# Patient Record
Sex: Male | Born: 1935
Health system: Southern US, Community
[De-identification: ages and names within clinical notes are randomized; demographics above are authoritative.]

## PROBLEM LIST (undated history)

## (undated) DIAGNOSIS — Z8719 Personal history of other diseases of the digestive system: Secondary | ICD-10-CM

## (undated) DIAGNOSIS — R7302 Impaired glucose tolerance (oral): Secondary | ICD-10-CM

## (undated) DIAGNOSIS — M545 Low back pain, unspecified: Secondary | ICD-10-CM

## (undated) DIAGNOSIS — T7840XA Allergy, unspecified, initial encounter: Secondary | ICD-10-CM

## (undated) DIAGNOSIS — N400 Enlarged prostate without lower urinary tract symptoms: Secondary | ICD-10-CM

## (undated) DIAGNOSIS — M1990 Unspecified osteoarthritis, unspecified site: Secondary | ICD-10-CM

## (undated) DIAGNOSIS — D126 Benign neoplasm of colon, unspecified: Secondary | ICD-10-CM

## (undated) DIAGNOSIS — I6529 Occlusion and stenosis of unspecified carotid artery: Secondary | ICD-10-CM

## (undated) DIAGNOSIS — H9190 Unspecified hearing loss, unspecified ear: Secondary | ICD-10-CM

## (undated) DIAGNOSIS — K219 Gastro-esophageal reflux disease without esophagitis: Secondary | ICD-10-CM

## (undated) DIAGNOSIS — H269 Unspecified cataract: Secondary | ICD-10-CM

## (undated) DIAGNOSIS — K5730 Diverticulosis of large intestine without perforation or abscess without bleeding: Secondary | ICD-10-CM

## (undated) DIAGNOSIS — I10 Essential (primary) hypertension: Secondary | ICD-10-CM

## (undated) DIAGNOSIS — Z972 Presence of dental prosthetic device (complete) (partial): Secondary | ICD-10-CM

## (undated) DIAGNOSIS — I739 Peripheral vascular disease, unspecified: Secondary | ICD-10-CM

## (undated) DIAGNOSIS — Z8673 Personal history of transient ischemic attack (TIA), and cerebral infarction without residual deficits: Secondary | ICD-10-CM

## (undated) DIAGNOSIS — E785 Hyperlipidemia, unspecified: Secondary | ICD-10-CM

## (undated) DIAGNOSIS — N529 Male erectile dysfunction, unspecified: Secondary | ICD-10-CM

## (undated) DIAGNOSIS — B009 Herpesviral infection, unspecified: Secondary | ICD-10-CM

## (undated) DIAGNOSIS — E119 Type 2 diabetes mellitus without complications: Secondary | ICD-10-CM

## (undated) DIAGNOSIS — C801 Malignant (primary) neoplasm, unspecified: Secondary | ICD-10-CM

## (undated) HISTORY — DX: Male erectile dysfunction, unspecified: N52.9

## (undated) HISTORY — DX: Malignant (primary) neoplasm, unspecified: C80.1

## (undated) HISTORY — DX: Unspecified cataract: H26.9

## (undated) HISTORY — DX: Peripheral vascular disease, unspecified: I73.9

## (undated) HISTORY — DX: Gastro-esophageal reflux disease without esophagitis: K21.9

## (undated) HISTORY — DX: Hyperlipidemia, unspecified: E78.5

## (undated) HISTORY — PX: ESOPHAGOGASTRODUODENOSCOPY: SHX1529

## (undated) HISTORY — DX: Benign prostatic hyperplasia without lower urinary tract symptoms: N40.0

## (undated) HISTORY — DX: Essential (primary) hypertension: I10

## (undated) HISTORY — DX: Low back pain, unspecified: M54.50

## (undated) HISTORY — DX: Personal history of other diseases of the digestive system: Z87.19

## (undated) HISTORY — PX: LUMBAR SPINE SURGERY: SHX701

## (undated) HISTORY — DX: Benign neoplasm of colon, unspecified: D12.6

## (undated) HISTORY — PX: COLONOSCOPY: SHX5424

## (undated) HISTORY — DX: Diverticulosis of large intestine without perforation or abscess without bleeding: K57.30

## (undated) HISTORY — DX: Low back pain: M54.5

## (undated) HISTORY — PX: BLEPHAROPLASTY: SUR158

## (undated) HISTORY — DX: Occlusion and stenosis of unspecified carotid artery: I65.29

## (undated) HISTORY — PX: APPENDECTOMY: SHX54

## (undated) HISTORY — PX: COSMETIC SURGERY: SHX468

## (undated) HISTORY — PX: SPINE SURGERY: SHX786

## (undated) HISTORY — PX: EYE SURGERY: SHX253

## (undated) HISTORY — DX: Allergy, unspecified, initial encounter: T78.40XA

## (undated) HISTORY — DX: Impaired glucose tolerance (oral): R73.02

## (undated) HISTORY — PX: CHOLECYSTECTOMY: SHX55

## (undated) HISTORY — PX: BASAL CELL CARCINOMA EXCISION: SHX1214

## (undated) HISTORY — DX: Unspecified osteoarthritis, unspecified site: M19.90

## (undated) HISTORY — DX: Personal history of transient ischemic attack (TIA), and cerebral infarction without residual deficits: Z86.73

## (undated) HISTORY — DX: Type 2 diabetes mellitus without complications: E11.9

---

## 1991-08-08 DIAGNOSIS — D126 Benign neoplasm of colon, unspecified: Secondary | ICD-10-CM

## 1991-08-08 HISTORY — DX: Benign neoplasm of colon, unspecified: D12.6

## 1998-08-09 ENCOUNTER — Ambulatory Visit (HOSPITAL_COMMUNITY): Admission: RE | Admit: 1998-08-09 | Discharge: 1998-08-09 | Payer: Self-pay | Admitting: Gastroenterology

## 1998-11-12 ENCOUNTER — Ambulatory Visit (HOSPITAL_COMMUNITY): Admission: RE | Admit: 1998-11-12 | Discharge: 1998-11-12 | Payer: Self-pay | Admitting: Internal Medicine

## 1998-11-12 ENCOUNTER — Encounter: Payer: Self-pay | Admitting: Internal Medicine

## 1999-02-14 ENCOUNTER — Encounter: Payer: Self-pay | Admitting: Internal Medicine

## 1999-02-14 ENCOUNTER — Ambulatory Visit (HOSPITAL_COMMUNITY): Admission: RE | Admit: 1999-02-14 | Discharge: 1999-02-14 | Payer: Self-pay | Admitting: Internal Medicine

## 2000-07-11 ENCOUNTER — Ambulatory Visit (HOSPITAL_COMMUNITY): Admission: RE | Admit: 2000-07-11 | Discharge: 2000-07-11 | Payer: Self-pay | Admitting: Internal Medicine

## 2000-07-11 ENCOUNTER — Encounter: Payer: Self-pay | Admitting: Internal Medicine

## 2004-07-24 ENCOUNTER — Ambulatory Visit: Payer: Self-pay | Admitting: Internal Medicine

## 2004-07-24 ENCOUNTER — Ambulatory Visit (HOSPITAL_COMMUNITY): Admission: RE | Admit: 2004-07-24 | Discharge: 2004-07-24 | Payer: Self-pay | Admitting: Internal Medicine

## 2004-08-09 ENCOUNTER — Ambulatory Visit: Payer: Self-pay | Admitting: Internal Medicine

## 2004-09-09 ENCOUNTER — Ambulatory Visit: Payer: Self-pay | Admitting: Internal Medicine

## 2004-09-17 ENCOUNTER — Ambulatory Visit: Payer: Self-pay | Admitting: Internal Medicine

## 2004-12-11 ENCOUNTER — Ambulatory Visit: Payer: Self-pay | Admitting: Internal Medicine

## 2004-12-26 ENCOUNTER — Ambulatory Visit: Payer: Self-pay

## 2005-02-19 ENCOUNTER — Ambulatory Visit: Payer: Self-pay | Admitting: Internal Medicine

## 2005-06-12 ENCOUNTER — Ambulatory Visit: Payer: Self-pay | Admitting: Internal Medicine

## 2005-06-26 ENCOUNTER — Ambulatory Visit: Payer: Self-pay

## 2005-08-06 ENCOUNTER — Ambulatory Visit: Payer: Self-pay | Admitting: Internal Medicine

## 2005-08-07 ENCOUNTER — Ambulatory Visit: Payer: Self-pay | Admitting: Cardiology

## 2005-08-26 ENCOUNTER — Ambulatory Visit: Payer: Self-pay | Admitting: Internal Medicine

## 2005-12-04 ENCOUNTER — Ambulatory Visit: Payer: Self-pay | Admitting: Internal Medicine

## 2006-01-20 ENCOUNTER — Ambulatory Visit: Payer: Self-pay | Admitting: Internal Medicine

## 2006-01-28 ENCOUNTER — Ambulatory Visit: Payer: Self-pay | Admitting: Internal Medicine

## 2006-07-02 ENCOUNTER — Ambulatory Visit: Payer: Self-pay

## 2006-07-03 ENCOUNTER — Ambulatory Visit: Payer: Self-pay | Admitting: Internal Medicine

## 2006-10-06 ENCOUNTER — Ambulatory Visit: Payer: Self-pay | Admitting: Internal Medicine

## 2006-10-14 ENCOUNTER — Ambulatory Visit: Payer: Self-pay | Admitting: Internal Medicine

## 2006-12-02 ENCOUNTER — Ambulatory Visit: Payer: Self-pay | Admitting: Internal Medicine

## 2006-12-02 LAB — CONVERTED CEMR LAB
ALT: 31 units/L (ref 0–53)
Alkaline Phosphatase: 60 units/L (ref 39–117)
BUN: 16 mg/dL (ref 6–23)
Chloride: 104 meq/L (ref 96–112)
Eosinophils Absolute: 0.3 10*3/uL (ref 0.0–0.6)
GFR calc Af Amer: 84 mL/min
Glucose, Bld: 91 mg/dL (ref 70–99)
Ketones, ur: NEGATIVE mg/dL
Leukocytes, UA: NEGATIVE
MCHC: 34.2 g/dL (ref 30.0–36.0)
MCV: 87.2 fL (ref 78.0–100.0)
Monocytes Relative: 15.7 % — ABNORMAL HIGH (ref 3.0–11.0)
Neutro Abs: 3.8 10*3/uL (ref 1.4–7.7)
Platelets: 207 10*3/uL (ref 150–400)
RDW: 12.5 % (ref 11.5–14.6)
Sodium: 143 meq/L (ref 135–145)
Total Bilirubin: 1.3 mg/dL — ABNORMAL HIGH (ref 0.3–1.2)
Total Protein, Urine: NEGATIVE mg/dL
Total Protein: 7.1 g/dL (ref 6.0–8.3)
Urobilinogen, UA: 0.2 (ref 0.0–1.0)
WBC: 7.2 10*3/uL (ref 4.5–10.5)

## 2006-12-18 ENCOUNTER — Ambulatory Visit: Payer: Self-pay | Admitting: Internal Medicine

## 2006-12-21 ENCOUNTER — Ambulatory Visit: Payer: Self-pay | Admitting: Cardiovascular Disease

## 2006-12-21 ENCOUNTER — Ambulatory Visit: Payer: Self-pay

## 2006-12-28 ENCOUNTER — Encounter: Payer: Self-pay | Admitting: Internal Medicine

## 2006-12-28 DIAGNOSIS — I719 Aortic aneurysm of unspecified site, without rupture: Secondary | ICD-10-CM | POA: Insufficient documentation

## 2006-12-28 DIAGNOSIS — Z860100 Personal history of colon polyps, unspecified: Secondary | ICD-10-CM | POA: Insufficient documentation

## 2006-12-28 DIAGNOSIS — I739 Peripheral vascular disease, unspecified: Secondary | ICD-10-CM | POA: Insufficient documentation

## 2006-12-28 DIAGNOSIS — K5730 Diverticulosis of large intestine without perforation or abscess without bleeding: Secondary | ICD-10-CM | POA: Insufficient documentation

## 2006-12-28 DIAGNOSIS — L570 Actinic keratosis: Secondary | ICD-10-CM | POA: Insufficient documentation

## 2006-12-28 DIAGNOSIS — G459 Transient cerebral ischemic attack, unspecified: Secondary | ICD-10-CM | POA: Insufficient documentation

## 2006-12-28 DIAGNOSIS — I10 Essential (primary) hypertension: Secondary | ICD-10-CM | POA: Insufficient documentation

## 2006-12-31 ENCOUNTER — Ambulatory Visit: Payer: Self-pay

## 2007-01-04 ENCOUNTER — Ambulatory Visit: Payer: Self-pay | Admitting: Internal Medicine

## 2007-02-10 ENCOUNTER — Ambulatory Visit: Payer: Self-pay | Admitting: Internal Medicine

## 2007-05-13 ENCOUNTER — Ambulatory Visit: Payer: Self-pay | Admitting: Internal Medicine

## 2007-09-10 ENCOUNTER — Ambulatory Visit: Payer: Self-pay | Admitting: Internal Medicine

## 2007-09-12 LAB — CONVERTED CEMR LAB
AST: 20 units/L (ref 0–37)
Alkaline Phosphatase: 51 units/L (ref 39–117)
BUN: 14 mg/dL (ref 6–23)
CO2: 28 meq/L (ref 19–32)
Calcium: 9.4 mg/dL (ref 8.4–10.5)
Chloride: 108 meq/L (ref 96–112)
Potassium: 4.7 meq/L (ref 3.5–5.1)
Sodium: 140 meq/L (ref 135–145)
Total Bilirubin: 1.5 mg/dL — ABNORMAL HIGH (ref 0.3–1.2)

## 2007-09-14 ENCOUNTER — Ambulatory Visit: Payer: Self-pay | Admitting: Internal Medicine

## 2007-09-14 DIAGNOSIS — R7309 Other abnormal glucose: Secondary | ICD-10-CM | POA: Insufficient documentation

## 2007-12-13 ENCOUNTER — Ambulatory Visit: Payer: Self-pay | Admitting: Internal Medicine

## 2007-12-13 LAB — CONVERTED CEMR LAB
AST: 24 units/L (ref 0–37)
BUN: 13 mg/dL (ref 6–23)
Basophils Absolute: 0 10*3/uL (ref 0.0–0.1)
Bilirubin, Direct: 0.1 mg/dL (ref 0.0–0.3)
CO2: 28 meq/L (ref 19–32)
Calcium: 9.5 mg/dL (ref 8.4–10.5)
Chloride: 107 meq/L (ref 96–112)
Eosinophils Relative: 4.5 % (ref 0.0–5.0)
GFR calc non Af Amer: 77 mL/min
Glucose, Bld: 121 mg/dL — ABNORMAL HIGH (ref 70–99)
HCT: 41.4 % (ref 39.0–52.0)
Hgb A1c MFr Bld: 6.2 % — ABNORMAL HIGH (ref 4.6–6.0)
LDL Cholesterol: 107 mg/dL — ABNORMAL HIGH (ref 0–99)
MCHC: 35 g/dL (ref 30.0–36.0)
Neutrophils Relative %: 52.9 % (ref 43.0–77.0)
PSA: 0.21 ng/mL (ref 0.10–4.00)
Platelets: 177 10*3/uL (ref 150–400)
Total Bilirubin: 1.3 mg/dL — ABNORMAL HIGH (ref 0.3–1.2)
Total CHOL/HDL Ratio: 4.8

## 2007-12-16 ENCOUNTER — Ambulatory Visit: Payer: Self-pay | Admitting: Internal Medicine

## 2008-02-23 ENCOUNTER — Ambulatory Visit: Payer: Self-pay | Admitting: Internal Medicine

## 2008-02-23 DIAGNOSIS — R0789 Other chest pain: Secondary | ICD-10-CM | POA: Insufficient documentation

## 2008-02-24 DIAGNOSIS — N401 Enlarged prostate with lower urinary tract symptoms: Secondary | ICD-10-CM | POA: Insufficient documentation

## 2008-02-24 DIAGNOSIS — K219 Gastro-esophageal reflux disease without esophagitis: Secondary | ICD-10-CM | POA: Insufficient documentation

## 2008-02-24 DIAGNOSIS — E785 Hyperlipidemia, unspecified: Secondary | ICD-10-CM | POA: Insufficient documentation

## 2008-02-24 DIAGNOSIS — R351 Nocturia: Secondary | ICD-10-CM

## 2008-02-24 DIAGNOSIS — J309 Allergic rhinitis, unspecified: Secondary | ICD-10-CM | POA: Insufficient documentation

## 2008-02-25 ENCOUNTER — Telehealth (INDEPENDENT_AMBULATORY_CARE_PROVIDER_SITE_OTHER): Payer: Self-pay | Admitting: *Deleted

## 2008-04-13 ENCOUNTER — Ambulatory Visit: Payer: Self-pay | Admitting: Internal Medicine

## 2008-04-13 LAB — CONVERTED CEMR LAB
CO2: 27 meq/L (ref 19–32)
Potassium: 4.5 meq/L (ref 3.5–5.1)

## 2008-04-20 ENCOUNTER — Ambulatory Visit: Payer: Self-pay | Admitting: Gastroenterology

## 2008-04-20 ENCOUNTER — Ambulatory Visit: Payer: Self-pay | Admitting: Internal Medicine

## 2008-05-19 ENCOUNTER — Ambulatory Visit: Payer: Self-pay | Admitting: Gastroenterology

## 2008-05-19 ENCOUNTER — Encounter: Payer: Self-pay | Admitting: Gastroenterology

## 2008-05-22 ENCOUNTER — Encounter: Payer: Self-pay | Admitting: Gastroenterology

## 2008-06-01 ENCOUNTER — Ambulatory Visit: Payer: Self-pay | Admitting: Internal Medicine

## 2008-06-01 DIAGNOSIS — R109 Unspecified abdominal pain: Secondary | ICD-10-CM | POA: Insufficient documentation

## 2008-06-01 DIAGNOSIS — R110 Nausea: Secondary | ICD-10-CM | POA: Insufficient documentation

## 2008-06-04 LAB — CONVERTED CEMR LAB
AST: 29 units/L (ref 0–37)
Albumin: 4.3 g/dL (ref 3.5–5.2)
Amylase: 50 units/L (ref 27–131)
BUN: 15 mg/dL (ref 6–23)
Basophils Relative: 0.4 % (ref 0.0–3.0)
Calcium: 9.6 mg/dL (ref 8.4–10.5)
Chloride: 104 meq/L (ref 96–112)
Creatinine, Ser: 1 mg/dL (ref 0.4–1.5)
Eosinophils Absolute: 0.3 10*3/uL (ref 0.0–0.7)
Eosinophils Relative: 4.4 % (ref 0.0–5.0)
GFR calc non Af Amer: 77 mL/min
HCT: 41.2 % (ref 39.0–52.0)
Hemoglobin: 14.3 g/dL (ref 13.0–17.0)
Ketones, ur: NEGATIVE mg/dL
Lipase: 18 units/L (ref 11.0–59.0)
MCHC: 34.7 g/dL (ref 30.0–36.0)
MCV: 87 fL (ref 78.0–100.0)
Monocytes Absolute: 0.6 10*3/uL (ref 0.1–1.0)
Neutro Abs: 3.3 10*3/uL (ref 1.4–7.7)
Neutrophils Relative %: 55.4 % (ref 43.0–77.0)
RBC: 4.74 M/uL (ref 4.22–5.81)
Specific Gravity, Urine: 1.02 (ref 1.000–1.03)
Total CK: 400 units/L (ref 7–195)
Total Protein, Urine: NEGATIVE mg/dL
Urine Glucose: NEGATIVE mg/dL
WBC: 6 10*3/uL (ref 4.5–10.5)
pH: 6 (ref 5.0–8.0)

## 2008-06-05 ENCOUNTER — Telehealth: Payer: Self-pay | Admitting: Internal Medicine

## 2008-06-30 ENCOUNTER — Ambulatory Visit: Payer: Self-pay | Admitting: Internal Medicine

## 2008-06-30 LAB — CONVERTED CEMR LAB
AST: 19 units/L (ref 0–37)
HDL: 38.7 mg/dL — ABNORMAL LOW (ref >39.0)
Hgb A1c MFr Bld: 6.4 % — ABNORMAL HIGH (ref 4.6–6.0)
Total Bilirubin: 1.2 mg/dL (ref 0.3–1.2)
Total CHOL/HDL Ratio: 3.7
VLDL: 14 mg/dL (ref 0–40)

## 2008-08-17 ENCOUNTER — Ambulatory Visit: Payer: Self-pay | Admitting: Internal Medicine

## 2008-11-30 ENCOUNTER — Ambulatory Visit: Payer: Self-pay | Admitting: Internal Medicine

## 2008-11-30 LAB — CONVERTED CEMR LAB
Albumin: 4.3 g/dL (ref 3.5–5.2)
CO2: 29 meq/L (ref 19–32)
Chloride: 104 meq/L (ref 96–112)
HDL: 39.7 mg/dL (ref >39.00)
LDL Cholesterol: 89 mg/dL (ref 0–99)
Sodium: 140 meq/L (ref 135–145)
Total CHOL/HDL Ratio: 4
Triglycerides: 176 mg/dL — ABNORMAL HIGH (ref 0.0–149.0)
VLDL: 35.2 mg/dL (ref 0.0–40.0)

## 2008-12-04 ENCOUNTER — Ambulatory Visit: Payer: Self-pay | Admitting: Internal Medicine

## 2008-12-04 DIAGNOSIS — M545 Low back pain, unspecified: Secondary | ICD-10-CM | POA: Insufficient documentation

## 2009-03-29 ENCOUNTER — Ambulatory Visit: Payer: Self-pay | Admitting: Internal Medicine

## 2009-03-29 LAB — CONVERTED CEMR LAB
ALT: 26 units/L (ref 0–53)
Alkaline Phosphatase: 56 units/L (ref 39–117)
Bilirubin, Direct: 0.2 mg/dL (ref 0.0–0.3)
CO2: 26 meq/L (ref 19–32)
Calcium: 9.5 mg/dL (ref 8.4–10.5)
Chloride: 108 meq/L (ref 96–112)
HDL: 38.9 mg/dL — ABNORMAL LOW (ref >39.00)
Sodium: 141 meq/L (ref 135–145)
TSH: 1.34 microintl units/mL (ref 0.35–5.50)
Total CHOL/HDL Ratio: 4
Total Protein: 7.1 g/dL (ref 6.0–8.3)

## 2009-04-03 ENCOUNTER — Ambulatory Visit: Payer: Self-pay | Admitting: Internal Medicine

## 2009-04-03 DIAGNOSIS — Z87891 Personal history of nicotine dependence: Secondary | ICD-10-CM | POA: Insufficient documentation

## 2009-04-06 ENCOUNTER — Telehealth: Payer: Self-pay | Admitting: Internal Medicine

## 2009-05-24 ENCOUNTER — Telehealth: Payer: Self-pay | Admitting: Internal Medicine

## 2009-05-28 ENCOUNTER — Telehealth: Payer: Self-pay | Admitting: Internal Medicine

## 2009-05-28 ENCOUNTER — Ambulatory Visit: Payer: Self-pay | Admitting: Internal Medicine

## 2009-05-28 DIAGNOSIS — L304 Erythema intertrigo: Secondary | ICD-10-CM | POA: Insufficient documentation

## 2009-05-28 DIAGNOSIS — L538 Other specified erythematous conditions: Secondary | ICD-10-CM | POA: Insufficient documentation

## 2009-05-28 DIAGNOSIS — A059 Bacterial foodborne intoxication, unspecified: Secondary | ICD-10-CM | POA: Insufficient documentation

## 2009-07-05 ENCOUNTER — Ambulatory Visit: Payer: Self-pay | Admitting: Internal Medicine

## 2009-07-05 ENCOUNTER — Encounter (INDEPENDENT_AMBULATORY_CARE_PROVIDER_SITE_OTHER): Payer: Self-pay | Admitting: *Deleted

## 2009-07-05 DIAGNOSIS — R1310 Dysphagia, unspecified: Secondary | ICD-10-CM | POA: Insufficient documentation

## 2009-07-06 LAB — CONVERTED CEMR LAB
AST: 22 units/L (ref 0–37)
Alkaline Phosphatase: 55 units/L (ref 39–117)
Basophils Absolute: 0.1 10*3/uL (ref 0.0–0.1)
Bilirubin Urine: NEGATIVE
Bilirubin, Direct: 0.2 mg/dL (ref 0.0–0.3)
Calcium: 9.7 mg/dL (ref 8.4–10.5)
GFR calc non Af Amer: 68.71 mL/min (ref >60)
Glucose, Bld: 116 mg/dL — ABNORMAL HIGH (ref 70–99)
Hemoglobin: 14.4 g/dL (ref 13.0–17.0)
Ketones, ur: NEGATIVE mg/dL
LDL Cholesterol: 71 mg/dL (ref 0–99)
Leukocytes, UA: NEGATIVE
Lymphocytes Relative: 26.6 % (ref 12.0–46.0)
Monocytes Relative: 8.1 % (ref 3.0–12.0)
Neutro Abs: 4.1 10*3/uL (ref 1.4–7.7)
Neutrophils Relative %: 61.9 % (ref 43.0–77.0)
PSA: 0.26 ng/mL (ref 0.10–4.00)
Platelets: 163 10*3/uL (ref 150.0–400.0)
RDW: 12.7 % (ref 11.5–14.6)
Sodium: 142 meq/L (ref 135–145)
TSH: 2.02 microintl units/mL (ref 0.35–5.50)
Total Bilirubin: 1.4 mg/dL — ABNORMAL HIGH (ref 0.3–1.2)
Total CHOL/HDL Ratio: 3
Urine Glucose: NEGATIVE mg/dL
Urobilinogen, UA: 0.2 (ref 0.0–1.0)
VLDL: 30 mg/dL (ref 0.0–40.0)

## 2009-07-11 ENCOUNTER — Telehealth (INDEPENDENT_AMBULATORY_CARE_PROVIDER_SITE_OTHER): Payer: Self-pay | Admitting: *Deleted

## 2009-07-27 ENCOUNTER — Telehealth: Payer: Self-pay | Admitting: Gastroenterology

## 2009-07-30 ENCOUNTER — Encounter: Payer: Self-pay | Admitting: Gastroenterology

## 2009-07-30 ENCOUNTER — Ambulatory Visit: Payer: Self-pay | Admitting: Internal Medicine

## 2009-07-30 DIAGNOSIS — R1310 Dysphagia, unspecified: Secondary | ICD-10-CM | POA: Insufficient documentation

## 2009-08-14 ENCOUNTER — Ambulatory Visit: Payer: Self-pay | Admitting: Gastroenterology

## 2009-09-03 ENCOUNTER — Ambulatory Visit: Payer: Self-pay | Admitting: Internal Medicine

## 2009-11-16 ENCOUNTER — Ambulatory Visit: Payer: Self-pay | Admitting: Internal Medicine

## 2009-11-16 LAB — CONVERTED CEMR LAB
ALT: 22 units/L (ref 0–53)
Albumin: 4.3 g/dL (ref 3.5–5.2)
BUN: 19 mg/dL (ref 6–23)
Chloride: 107 meq/L (ref 96–112)
Cholesterol: 159 mg/dL (ref 0–200)
Creatinine, Ser: 1.1 mg/dL (ref 0.4–1.5)
GFR calc non Af Amer: 70.87 mL/min (ref >60)
Glucose, Bld: 121 mg/dL — ABNORMAL HIGH (ref 70–99)
LDL Cholesterol: 84 mg/dL (ref 0–99)
Total Bilirubin: 1.2 mg/dL (ref 0.3–1.2)
Triglycerides: 157 mg/dL — ABNORMAL HIGH (ref 0.0–149.0)

## 2009-11-26 ENCOUNTER — Ambulatory Visit: Payer: Self-pay | Admitting: Internal Medicine

## 2009-11-29 ENCOUNTER — Encounter: Payer: Self-pay | Admitting: Internal Medicine

## 2010-01-03 ENCOUNTER — Telehealth: Payer: Self-pay | Admitting: Gastroenterology

## 2010-01-03 ENCOUNTER — Telehealth: Payer: Self-pay | Admitting: Internal Medicine

## 2010-01-04 ENCOUNTER — Ambulatory Visit: Payer: Self-pay | Admitting: Internal Medicine

## 2010-01-04 DIAGNOSIS — R509 Fever, unspecified: Secondary | ICD-10-CM | POA: Insufficient documentation

## 2010-01-04 DIAGNOSIS — R1031 Right lower quadrant pain: Secondary | ICD-10-CM | POA: Insufficient documentation

## 2010-01-21 ENCOUNTER — Ambulatory Visit: Payer: Self-pay | Admitting: Internal Medicine

## 2010-01-21 DIAGNOSIS — N529 Male erectile dysfunction, unspecified: Secondary | ICD-10-CM | POA: Insufficient documentation

## 2010-02-26 ENCOUNTER — Telehealth: Payer: Self-pay | Admitting: Internal Medicine

## 2010-02-28 ENCOUNTER — Telehealth: Payer: Self-pay | Admitting: Internal Medicine

## 2010-04-22 ENCOUNTER — Ambulatory Visit: Payer: Self-pay | Admitting: Internal Medicine

## 2010-04-22 LAB — CONVERTED CEMR LAB
ALT: 18 units/L (ref 0–53)
AST: 19 units/L (ref 0–37)
Albumin: 4.3 g/dL (ref 3.5–5.2)
Alkaline Phosphatase: 54 units/L (ref 39–117)
BUN: 17 mg/dL (ref 6–23)
Basophils Relative: 0.3 % (ref 0.0–3.0)
CO2: 27 meq/L (ref 19–32)
Cholesterol: 156 mg/dL (ref 0–200)
Eosinophils Absolute: 0.2 10*3/uL (ref 0.0–0.7)
Eosinophils Relative: 2.7 % (ref 0.0–5.0)
GFR calc non Af Amer: 79.28 mL/min (ref >60)
Glucose, Bld: 129 mg/dL — ABNORMAL HIGH (ref 70–99)
HCT: 41.5 % (ref 39.0–52.0)
Hemoglobin: 14.4 g/dL (ref 13.0–17.0)
Lymphs Abs: 2 10*3/uL (ref 0.7–4.0)
MCHC: 34.8 g/dL (ref 30.0–36.0)
MCV: 89.2 fL (ref 78.0–100.0)
Monocytes Absolute: 0.5 10*3/uL (ref 0.1–1.0)
Neutro Abs: 4.4 10*3/uL (ref 1.4–7.7)
Neutrophils Relative %: 61.5 % (ref 43.0–77.0)
Potassium: 5.3 meq/L — ABNORMAL HIGH (ref 3.5–5.1)
RBC: 4.66 M/uL (ref 4.22–5.81)
Sodium: 138 meq/L (ref 135–145)
Total Protein: 6.8 g/dL (ref 6.0–8.3)
VLDL: 24.8 mg/dL (ref 0.0–40.0)
WBC: 7.1 10*3/uL (ref 4.5–10.5)

## 2010-04-24 ENCOUNTER — Ambulatory Visit: Payer: Self-pay | Admitting: Internal Medicine

## 2010-04-24 DIAGNOSIS — M1990 Unspecified osteoarthritis, unspecified site: Secondary | ICD-10-CM | POA: Insufficient documentation

## 2010-07-09 NOTE — Assessment & Plan Note (Signed)
Summary: dysphagia/sheri   History of Present Illness Visit Type: Initial Visit Primary GI MD: Elie Goody MD [facility] Primary Provider: Jacinta Shoe, MD Chief Complaint: Pt states 4 weeks ago he ate pork strips and afterwards he starting having chest pain and a sore throat. Pt states his throat stays sore and food feels like its getting stuck more frequently now.  History of Present Illness:   77 YO MALE KNOWN TO DR.STARK WITH HX OF ADENOMATOUS COLON POLYPS.HE LAST HAD COLONOSCOPY IN 12/09-2 ADENOMATOUS POLYPS REMOVED. HE COMES IN TODAY WITH C/O SOLID FOOD DYSPHAGIA WHICH HAS BEEN PRESENT OVER THE PAST 3-4 WEEKS. HE RELATES IT TO AN URI WHICH IS STILL LINGERING WITH DRY COUGH ETC. HE HAS NO C/O HEARTBURN,INDIGESTION,NO ABDOMINAL PAIN,APPETITE IS FINE. HE FEELS SOLID FOOD ESPECIALLY MEATS" HANG UP" THEN GETS DISCOMFORT IN HIS CHEST. NO REGURGITATION. NO WEIGHT LOSS.   GI Review of Systems    Reports abdominal pain, bloating, chest pain, and  dysphagia with solids.     Location of  Abdominal pain: epigastric area.    Denies acid reflux, belching, dysphagia with liquids, heartburn, loss of appetite, nausea, vomiting, vomiting blood, weight loss, and  weight gain.        Denies anal fissure, black tarry stools, change in bowel habit, constipation, diarrhea, diverticulosis, fecal incontinence, heme positive stool, hemorrhoids, irritable bowel syndrome, jaundice, light color stool, liver problems, rectal bleeding, and  rectal pain.    Current Medications (verified): 1)  Aspirin 81 Mg Tabs (Aspirin) 2)  Lisinopril 20 Mg Tabs (Lisinopril) .... Once Daily 3)  Viagra 100 Mg Tabs (Sildenafil Citrate) 4)  Vitamin D3 1000 Unit  Tabs (Cholecalciferol) .Marland Kitchen.. 1 By Mouth Daily 5)  Crestor 10 Mg Tabs (Rosuvastatin Calcium) .... One Tablet By Mouth Once Daily 6)  Nexium 40 Mg Cpdr (Esomeprazole Magnesium) .Marland Kitchen.. 1 By Mouth Qam ( Medically Necessary)  Allergies (verified): 1)  * Statins  Past  History:  Past Medical History: Colonic polyps, hx of- one with carcinoma IN SITU 1993,MULTIPLE ADENOMATOUS Diverticulosis, colon Hypertension Peripheral vascular disease - bilat carotid Transient ischemic attack, hx of ED glucose intolerance Hyperlipidemia GERD Allergic rhinitis Benign prostatic hypertrophy hx of pancreatitis Low back pain  Past Surgical History: Reviewed history from 02/23/2008 and no changes required. 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  The patient denies anemia, anxiety-new, arthritis/joint pain, blood in urine, breast changes/lumps, change in vision, confusion, coughing up blood, depression-new, fainting, fatigue, fever, headaches-new, heart murmur, heart rhythm changes, menstrual pain, night sweats, nosebleeds, pregnancy symptoms, shortness of breath, sleeping problems, swelling of feet/legs, swollen lymph glands, thirst - excessive , urination - excessive , urination changes/pain, urine leakage, vision changes, and voice change.         ROS OTHERWISE AS IN HPI  Vital Signs:  Patient profile:   77 year old male Height:      71 inches Weight:      215.25 pounds BMI:     30.13 Pulse rate:   70 / minute Pulse rhythm:   regular BP sitting:   146 / 70  (left arm) Cuff size:   large  Vitals Entered By: Christie Nottingham CMA Duncan Dull) (July 30, 2009 8:25 AM)  Physical Exam  General:  Well developed, well nourished, no acute distress. Head:  Normocephalic and atraumatic. Eyes:  PERRLA, no icterus. Lungs:  Clear throughout to auscultation. Heart:  Regular rate and rhythm; no murmurs, rubs,  or bruits. Abdomen:  SOFT, NONTENDER, NO MASS OR  HSM,BS+ Rectal:  NOT DONE Extremities:  No clubbing, cyanosis, edema or deformities noted. Neurologic:  Alert and  oriented x4;  grossly normal neurologically. Psych:  Alert and cooperative. Normal mood and affect.   Impression & Recommendations:  Problem # 1:  DYSPHAGIA (ICD-787.29) Assessment New 77 YO MALE WITH NEW ONSET SOLID FOOD DYSPHAGIA;R/O PEPTIC STICTURE,R/O MALIGNANCY.  START NEXIUM 40 MG DAILY (PT HAS NEXIUM ON HIS MED LIST BUT DOES NOT THINK HE HAS BEEN TAKING IT REGULARLY) IN AM . SCHEDULE PT FOR EGD WITH POSSIBLE SAVARY DILATION WITH DR. Russella Dar ,PROCEDURE DISCUSSED IN DETAIL WITH PT. ADVISED AVOIDANCE OF MEAT EXCEPT GROUND/FINELY CHOPPED UNTIL EGD.  Problem # 2:  FAMILY HX COLON CANCER (ICD-V16.0) Assessment: Comment Only LAST COLON 2009  Problem # 3:  DIVERTICULOSIS, COLON (ICD-562.10) Assessment: Comment Only  Problem # 4:  TRANSIENT ISCHEMIC ATTACK, HX OF (ICD-V12.50) Assessment: Comment Only  Problem # 5:  COLONIC POLYPS, HX OF (ICD-V12.72) Assessment: Comment Only ADENOMATOUS-DUE FOR FOLLOW UP  05/2011  Other Orders: EGD (EGD)  Patient Instructions: 1)  Endoscopy scheduled with Dr. Russella Dar on 08-14-09. 2)  Endoscopy brochure provided. 3)  [facility] Patient Information Guide given . 4)  Copy sent to : A. Plotnikov, MD Prescriptions: NEXIUM 40 MG CPDR (ESOMEPRAZOLE MAGNESIUM) 1 by mouth qam ( medically necessary)  #30 x 12   Entered by:   Lowry Ram NCMA   Authorized by:   Sammuel Cooper PA-c   Signed by:   Lowry Ram NCMA on 07/30/2009   Method used:   [facility] to        [facility]  [address] [id number]       [address]       [place], [place]  [address]       Ph: [phone] or [phone]       Fax: [phone]   RxID:   [id number]

## 2010-07-09 NOTE — Progress Notes (Signed)
Summary: rx request  Phone Note Call from Patient Call back at Home Phone [phone]   Caller: Spouse Summary of Call: Patient spouse left message on triage that the patient has had a cold/sore throat x1 week. OTC meds have not helped much, and they would like to know if ABS could be called in. Please advise. Initial call taken by: Lucious Groves,  July 11, 2009 11:25 AM  Follow-up for Phone Call        ok [facility]pac Follow-up by: Tresa Garter MD,  July 11, 2009 12:34 PM  Additional Follow-up for Phone Call Additional follow up Details #1::        pt informed Additional Follow-up by: Ami Bullins CMA,  July 11, 2009 1:28 PM    New/Updated Medications: ZITHROMAX Z-PAK 250 MG TABS (AZITHROMYCIN) as dirrected Prescriptions: ZITHROMAX Z-PAK 250 MG TABS (AZITHROMYCIN) as dirrected  #1 x 0   Entered and Authorized by:   Tresa Garter MD   Signed by:   Bill Salinas CMA on 07/11/2009   Method used:   [facility]  [address] [id number]       [address]       [place], [place]  [address]       Ph: [phone] or [phone]       Fax: [phone]   RxID:   [id number]

## 2010-07-09 NOTE — Assessment & Plan Note (Signed)
Summary: STOMACH FEEL JAMMED AND FOOD WON'T COME OUT  STC   Vital Signs:  Patient profile:   77 year old male Height:      71 inches Weight:      203 pounds BMI:     28.42 O2 Sat:      96 % on Room air Temp:     99.0 degrees F oral Pulse rate:   80 / minute Pulse rhythm:   regular Resp:     16 per minute BP sitting:   110 / 72  (left arm) Cuff size:   regular  Vitals Entered By: Lanier Prude, CMA(AAMA) (January 04, 2010 4:36 PM)  O2 Flow:  Room air CC: abd pain X 1 week Is Patient Diabetic? No   Primary Care Provider:  Jacinta Shoe, MD  CC:  abd pain X 1 week.  History of Present Illness: Pt c/o "stomach" toubles, also thinks that after he ate "a lot" of corn last week it is stuck somewhere in intestines. Also has had the "blind staggers" when  he  got overheated x 1 hr.  Took a laxative.The pain was in LLQ and  in RLQ. No n/v. Constipated...  Current Medications (verified): 1)  Aspirin 81 Mg Tabs (Aspirin) 2)  Lisinopril 20 Mg Tabs (Lisinopril) .... Once Daily 3)  Viagra 100 Mg Tabs (Sildenafil Citrate) 4)  Vitamin D3 1000 Unit  Tabs (Cholecalciferol) .Marland Kitchen.. 1 By Mouth Daily 5)  Crestor 10 Mg Tabs (Rosuvastatin Calcium) .... One Tablet By Mouth Once Daily 6)  Omeprazole 40 Mg Cpdr (Omeprazole) .Marland Kitchen.. 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 basal cell s/p LS spine  surgury Cholecystectomy Appendectomy  Review of Systems       The patient complains of anorexia and abdominal pain.  The patient denies fever, weight loss, chest pain, dyspnea on exertion, melena, hematochezia, and severe indigestion/heartburn.    Physical Exam  General:  NAD overweight-appearing.   Eyes:  No corneal or conjunctival inflammation noted. EOMI. Perrla.  Ears:  External ear exam shows no significant lesions or deformities.  Otoscopic examination reveals clear canals, tympanic membranes are intact bilaterally without bulging, retraction, inflammation or discharge. Hearing is grossly normal bilaterally. Nose:  swollen nasal mucosa Mouth:  WNL Neck:  No mass or bruit Lungs:  CTA Heart:  RRR Abdomen:  S/NT no masses, no rigidity, no hepatomegaly, and no splenomegaly.   Msk:  No deformity or scoliosis noted of thoracic or lumbar spine.   Extremities:  No clubbing, cyanosis, edema, or deformity noted with normal full range of motion of all joints.   Neurologic:  No cranial nerve deficits noted. Station and gait are normal. Plantar reflexes are down-going bilaterally. DTRs are symmetrical throughout. Sensory, motor and coordinative functions appear intact. Skin:  No jaundice. AKs on face and UEs Inguinal Nodes:  No significant adenopathy Psych:  Cognition and judgment appear intact. Alert and cooperative with normal attention span and concentration. No apparent delusions, illusions, hallucinations   Impression &  Recommendations:  Problem # 1:  RLQ PAIN (ICD-789.03) possibly due to diverticulitis Assessment New Antibiotics given See "Patient Instructions".  Orders: TLB-BMP (Basic Metabolic Panel-BMET) (80048-METABOL) TLB-CBC Platelet - w/Differential (85025-CBCD) TLB-Sedimentation Rate (ESR) (85652-ESR) T-Abdomen 2-view (74020TC)  Problem # 2:  FEVER UNSPECIFIED (ICD-780.60) Assessment: New  Orders: T-Abdomen 2-view (74020TC)  Problem # 3:  DYSPHAGIA  (WJX-914.78) Assessment: Deteriorated GI consult is pending   Problem # 4:  COLONIC POLYPS, HX OF (ICD-V12.72) Assessment: Comment Only  Complete Medication List: 1)  Aspirin 81 Mg Tabs (Aspirin) 2)  Lisinopril 20 Mg Tabs (Lisinopril) .... Once daily 3)  Viagra 100 Mg Tabs (Sildenafil citrate) 4)  Vitamin D3 1000 Unit Tabs (Cholecalciferol) .Marland Kitchen.. 1 by mouth daily 5)  Crestor 10 Mg Tabs (Rosuvastatin calcium) .... One tablet by mouth once daily 6)  Omeprazole 40 Mg Cpdr (Omeprazole) .Marland Kitchen.. 1 by mouth qam for indigestion 7)  Ciprofloxacin Hcl 500 Mg Tabs (Ciprofloxacin hcl) .Marland Kitchen.. 1 by mouth bid 8)  Metronidazole 250 Mg Tabs (Metronidazole) .Marland Kitchen.. 1 by mouth qid  Patient Instructions: 1)  Low residue diet 2)  Please schedule a follow-up appointment in 2 weeks. 3)  Amitiza 1 a day for constipation 4)  Call if you are not better in a reasonable amount of time or if worse. Go to ER if feeling really bad!  Prescriptions: METRONIDAZOLE 250 MG TABS (METRONIDAZOLE) 1 by mouth qid  #40 x 1   Entered and Authorized by:   Tresa Garter MD   Signed by:   Tresa Garter MD on 01/04/2010   Method used:   Print then Give to Patient   RxID:   [id number] CIPROFLOXACIN HCL 500 MG TABS (CIPROFLOXACIN HCL) 1 by mouth bid  #20 x 0   Entered and Authorized by:   Tresa Garter MD   Signed by:   Tresa Garter MD on 01/04/2010   Method used:   Print then Give to Patient   RxID:   [id number] METRONIDAZOLE 250 MG TABS (METRONIDAZOLE) 1 by mouth qid  #40 x 1   Entered and Authorized by:   Tresa Garter MD   Signed by:   Tresa Garter MD on 01/04/2010   Method used:   [facility]        [facility]  [address] [id number]       [address]       [place], [place]  [address]       Ph: [phone] or [phone]       Fax: [phone]   RxID:   [id number] CIPROFLOXACIN HCL 500 MG TABS (CIPROFLOXACIN HCL) 1 by mouth bid  #20 x 0    Entered and Authorized by:   Tresa Garter MD   Signed by:   Tresa Garter MD on 01/04/2010   Method used:   [facility]  [address] [id number]       [address]       [place], [place]  [address]       Ph: [phone] or [phone]       Fax: [phone]   RxID:   [id number]

## 2010-07-09 NOTE — Assessment & Plan Note (Signed)
Summary: YEARLY FU/ MEDICARE /NWS #  // changed to ROB (cpx) per wife/cd   Vital Signs:  Patient profile:   77 year old male Weight:      217 pounds Temp:     97.6 degrees F oral Pulse rate:   69 / minute BP sitting:   162 / 64  (left arm)  Vitals Entered By: Tora Perches (July 05, 2009 8:29 AM) CC: cpx Is Patient Diabetic? No   CC:  cpx.  History of Present Illness: The patient presents for a wellness examination C/o food would stop in the throat x 2 wks that started after he ate popcorn  Preventive Screening-Counseling & Management  Alcohol-Tobacco     Smoking Status: quit  Current Medications (verified): 1)  Aspirin 81 Mg Tabs (Aspirin) 2)  Lisinopril 20 Mg Tabs (Lisinopril) .... Once Daily 3)  Viagra 100 Mg Tabs (Sildenafil Citrate) 4)  Vitamin D3 1000 Unit  Tabs (Cholecalciferol) .Marland Kitchen.. 1 By Mouth Daily 5)  Naprosyn 500 Mg Tabs (Naproxen) .Marland Kitchen.. 1 Two Times A Day Pc Prn 6)  Crestor 40 Mg Tabs (Rosuvastatin Calcium) .Marland Kitchen.. 1 Tablet By Mouth Daily 7)  Hydrocodone-Acetaminophen 5-325 Mg Tabs (Hydrocodone-Acetaminophen) .Marland Kitchen.. 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  The patient denies anorexia, fever, weight loss, weight gain, vision loss, decreased hearing, hoarseness, chest pain, syncope, dyspnea on exertion, peripheral edema, prolonged cough, headaches, hemoptysis, abdominal pain, melena, hematochezia, hematuria, incontinence, genital sores, muscle weakness, suspicious skin lesions, transient blindness, difficulty walking, depression, unusual weight change, abnormal bleeding, enlarged lymph nodes, angioedema, and testicular masses.         Dysphagia, constipation  Physical Exam  General:  NAD overweight-appearing.   Head:  Normocephalic and atraumatic without obvious abnormalities. No apparent alopecia or balding. Eyes:  No corneal or conjunctival inflammation noted. EOMI. Perrla.  Ears:  External ear exam shows no significant lesions or deformities.  Otoscopic examination reveals clear canals, tympanic membranes are intact bilaterally without bulging, retraction, inflammation or discharge. Hearing is grossly normal bilaterally. Nose:  swollen nasal mucosa Mouth:  WNL Neck:  No mass or bruit Lungs:  CTA Heart:  RRR Abdomen:  S/NT no masses, no rigidity, no hepatomegaly, and no splenomegaly.   Rectal:  No external abnormalities noted. Normal sphincter tone. No rectal masses or tenderness. Genitalia:  Testes bilaterally descended without nodularity, tenderness or masses. No scrotal masses or lesions. No penis lesions or urethral discharge. Prostate:  1+ enlarged.   Msk:  No deformity or scoliosis noted of thoracic or lumbar spine.   Pulses:  R and L carotid,radial,femoral,dorsalis pedis and posterior tibial pulses are full and equal bilaterally Extremities:  No clubbing, cyanosis, edema, or deformity noted with normal full range of motion of all joints.  Neurologic:  No cranial nerve deficits noted. Station and gait are normal. Plantar reflexes are down-going bilaterally. DTRs are symmetrical throughout. Sensory, motor  and coordinative functions appear intact. Skin:  No jaundice. AKs Cervical Nodes:  No lymphadenopathy noted Inguinal Nodes:  No significant adenopathy Psych:  Cognition and judgment appear intact. Alert and cooperative with normal attention span and concentration. No apparent delusions, illusions, hallucinations   Impression & Recommendations:  Problem # 1:  PHYSICAL EXAMINATION (ICD-V70.0) Assessment New Health and age related issues were discussed. Available screening tests and vaccinations were discussed as well. Healthy life style including good diet and execise was discussed. Refused shots Orders: EKG w/ Interpretation (93000) TLB-BMP (Basic Metabolic Panel-BMET) (80048-METABOL) TLB-CBC Platelet - w/Differential (85025-CBCD) TLB-Hepatic/Liver Function Pnl (80076-HEPATIC) TLB-Lipid Panel (80061-LIPID) TLB-TSH (Thyroid Stimulating Hormone) (84443-TSH) TLB-PSA (Prostate Specific Antigen) (84153-PSA) TLB-Udip ONLY (81003-UDIP)  Problem # 2:  DYSPHAGIA UNSPECIFIED (ICD-787.20) Assessment: New  Orders: Gastroenterology Referral (GI)  Problem # 3:  GERD (ICD-530.81) Assessment: Deteriorated  His updated medication list for this problem includes:    Nexium 40 Mg Cpdr (Esomeprazole magnesium) .Marland Kitchen... 1 by mouth qam ( medically necessary)  Problem # 4:  ERECTILE DYSFUNCTION (ICD-302.72) Assessment: Unchanged  His updated medication list for this problem includes:    Viagra 100 Mg Tabs (Sildenafil citrate)  Complete Medication List: 1)  Aspirin 81 Mg Tabs (Aspirin) 2)  Lisinopril 20 Mg Tabs (Lisinopril) .... Once daily 3)  Viagra 100 Mg Tabs (Sildenafil citrate) 4)  Vitamin D3 1000 Unit Tabs (Cholecalciferol) .Marland Kitchen.. 1 by mouth daily 5)  Crestor 40 Mg Tabs (Rosuvastatin calcium) .Marland Kitchen.. 1 tablet by mouth daily 6)  Hydrocodone-acetaminophen 5-325 Mg Tabs (Hydrocodone-acetaminophen) .Marland Kitchen.. 1 by mouth up to 4 times per day as needed for pain 7)  Nexium 40 Mg Cpdr (Esomeprazole magnesium)  .Marland Kitchen.. 1 by mouth qam ( medically necessary)  Contraindications/Deferment of Procedures/Staging:    Treatment: Flu Shot    Contraindication: other     Test/Procedure: Pneumovax vaccine    Reason for deferment: patient declined   Patient Instructions: 1)  Please schedule a follow-up appointment in 2 months. 2)  Call if you are not better in a reasonable amount of time or if worse.  3)  Soft food for now Prescriptions: NEXIUM 40 MG CPDR (ESOMEPRAZOLE MAGNESIUM) 1 by mouth qam ( medically necessary)  #30 x 12   Entered and Authorized by:   Tresa Garter MD   Signed by:   Tresa Garter MD on 07/05/2009   Method used:   Print then Give to Patient   RxID:   [id number]

## 2010-07-09 NOTE — Assessment & Plan Note (Signed)
Summary: 3 MO ROV /NWS  #   Vital Signs:  Patient profile:   77 year old male Height:      71 inches (180.34 cm) Weight:      217.75 pounds (98.98 kg) BMI:     30.48 O2 Sat:      97 % on Room air Temp:     97.5 degrees F (36.39 degrees C) oral Pulse rate:   67 / minute BP sitting:   130 / 70  (left arm) Cuff size:   large  Vitals Entered By: Lucious Groves (November 26, 2009 7:59 AM)  O2 Flow:  Room air CC: 3 mo rtn ov./kb Is Patient Diabetic? No Pain Assessment Patient in pain? no        Primary Care Provider:  Jacinta Shoe, MD  CC:  3 mo rtn ov./kb.  History of Present Illness: The patient presents for a follow up of back pain, ED, HTN, CAD, elev glu   Current Medications (verified): 1)  Aspirin 81 Mg Tabs (Aspirin) 2)  Lisinopril 20 Mg Tabs (Lisinopril) .... Once Daily 3)  Viagra 100 Mg Tabs (Sildenafil Citrate) 4)  Vitamin D3 1000 Unit  Tabs (Cholecalciferol) .Marland Kitchen.. 1 By Mouth Daily 5)  Crestor 10 Mg Tabs (Rosuvastatin Calcium) .... One Tablet By Mouth Once Daily 6)  Omeprazole 40 Mg Cpdr (Omeprazole) .Marland Kitchen.. 1 By Mouth Qam For Indigestion  Allergies (verified): 1)  * Statins  Past History:  Social History: Last updated: 07/30/2009 Retired - Holiday representative Married Former Smoker Alcohol use-yes Regular exercise-yes Daily Caffeine Use  Past Medical History: Reviewed history from 07/30/2009 and no changes required. Colonic polyps, hx of- one with carcinoma IN [place] 1993,MULTIPLE ADENOMATOUS Diverticulosis, colon Hypertension Peripheral vascular disease - bilat carotid Transient ischemic attack, hx of ED glucose intolerance Hyperlipidemia GERD Allergic rhinitis Benign prostatic hypertrophy hx of pancreatitis Low back pain  Review of Systems  The patient denies fever, syncope, and abdominal pain.    Physical Exam  General:  NAD overweight-appearing.   Ears:  External ear exam shows no significant lesions or deformities.  Otoscopic examination  reveals clear canals, tympanic membranes are intact bilaterally without bulging, retraction, inflammation or discharge. Hearing is grossly normal bilaterally. Nose:  swollen nasal mucosa Mouth:  WNL Neck:  No mass or bruit Lungs:  CTA Heart:  RRR Abdomen:  S/NT no masses, no rigidity, no hepatomegaly, and no splenomegaly.   Prostate:  1+ enlarged.   Msk:  No deformity or scoliosis noted of thoracic or lumbar spine.   Extremities:  No clubbing, cyanosis, edema, or deformity noted with normal full range of motion of all joints.   Neurologic:  No cranial nerve deficits noted. Station and gait are normal. Plantar reflexes are down-going bilaterally. DTRs are symmetrical throughout. Sensory, motor and coordinative functions appear intact. Skin:  No jaundice. AKs on face and UEs Psych:  Cognition and judgment appear intact. Alert and cooperative with normal attention span and concentration. No apparent delusions, illusions, hallucinations   Impression & Recommendations:  Problem # 1:  HYPERLIPIDEMIA (ICD-272.4) Assessment Improved  His updated medication list for this problem includes:    Crestor 10 Mg Tabs (Rosuvastatin calcium) ..... One tablet by mouth once daily  Problem # 2:  DYSPHAGIA (ICD-787.29) resolved Assessment: Improved  Problem # 3:  ABNORMAL GLUCOSE NEC (ICD-790.29) Assessment: Unchanged  A1c Loose wt  Labs Reviewed: Creat: 1.1 (11/16/2009)     Problem # 4:  HYPERTENSION (ICD-401.9) Assessment: Unchanged  His updated medication list for this  problem includes:    Lisinopril 20 Mg Tabs (Lisinopril) ..... Once daily  BP today: 130/70 Prior BP: 126/76 (09/03/2009)  Labs Reviewed: K+: 4.9 (11/16/2009) Creat: : 1.1 (11/16/2009)   Chol: 159 (11/16/2009)   HDL: 43.80 (11/16/2009)   LDL: 84 (11/16/2009)   TG: 157.0 (11/16/2009)  Problem # 5:  ERECTILE DYSFUNCTION (ICD-302.72) Assessment: Unchanged Try Staxyn His updated medication list for this problem includes:     Viagra 100 Mg Tabs (Sildenafil citrate)  Complete Medication List: 1)  Aspirin 81 Mg Tabs (Aspirin) 2)  Lisinopril 20 Mg Tabs (Lisinopril) .... Once daily 3)  Viagra 100 Mg Tabs (Sildenafil citrate) 4)  Vitamin D3 1000 Unit Tabs (Cholecalciferol) .Marland Kitchen.. 1 by mouth daily 5)  Crestor 10 Mg Tabs (Rosuvastatin calcium) .... One tablet by mouth once daily 6)  Omeprazole 40 Mg Cpdr (Omeprazole) .Marland Kitchen.. 1 by mouth qam for indigestion  Patient Instructions: 1)  Please schedule a follow-up appointment in 3 months. 2)  BMP prior to visit, ICD-9: 3)  HbgA1C prior to visit, ICD-9:790.29 4)  Well exam at [facility] is pending

## 2010-07-09 NOTE — Letter (Signed)
Summary: EGD Instructions  [facility]  [address] [place], [place] [address]   Phone: [phone]  Fax: [phone]       Richard Burgess    [birth year]    MRN: [id number]       Procedure Day /Date:08-14-09     Arrival Time: 2:30 PM     Procedure Time:3:30 PM     Location of Procedure:                    X    [facility] (4th Floor)    PREPARATION FOR ENDOSCOPY   On 08-14-09 THE DAY OF THE PROCEDURE:  1.   No solid foods, milk or milk products are allowed after midnight the night before your procedure.  2.   Do not drink anything colored red or purple.  Avoid juices with pulp.  No orange juice.  3.  You may drink clear liquids until  1:30 PM, which is 2 hours before your procedure.                                                                                                CLEAR LIQUIDS INCLUDE: Water Jello Ice Popsicles Tea (sugar ok, no milk/cream) Powdered fruit flavored drinks Coffee (sugar ok, no milk/cream) Gatorade Juice: apple, white grape, white cranberry  Lemonade Clear bullion, consomm, broth Carbonated beverages (any kind) Strained chicken noodle soup Hard Candy   MEDICATION INSTRUCTIONS  Unless otherwise instructed, you should take regular prescription medications with a small sip of water as early as possible the morning of your procedure.        OTHER INSTRUCTIONS  You will need a responsible adult at least 75 years of age to accompany you and drive you home.   This person must remain in the waiting room during your procedure.  Wear loose fitting clothing that is easily removed.  Leave jewelry and other valuables at home.  However, you may wish to bring a book to read or an iPod/MP3 player to listen to music as you wait for your procedure to start.  Remove all body piercing jewelry and leave at home.  Total time from sign-in until discharge is approximately 2-3 hours.  You should go home directly after your  procedure and rest.  You can resume normal activities the day after your procedure.  The day of your procedure you should not:   Drive   Make legal decisions   Operate machinery   Drink alcohol   Return to work  You will receive specific instructions about eating, activities and medications before you leave.    The above instructions have been reviewed and explained to me by   _______________________    I fully understand and can verbalize these instructions _____________________________ Date _________

## 2010-07-09 NOTE — Progress Notes (Signed)
Summary: triage  Phone Note Call from Patient Call back at Home Phone [phone]   Caller: Patient Call For: Dr. Russella Dar Reason for Call: Talk to Nurse Summary of Call: pt says he has diverticulitis and he ate 3 ears of corn and has started a flare Initial call taken by: Vallarie Mare,  January 03, 2010 2:05 PM  Follow-up for Phone Call        I spoke with the patient's wife, patient c/o "stomach pain".  She says he is outside and unavailable to speak with me.  She reports he feels there is "a wad of stuff stuck in his intestines".  I have reviewed the phone note from primary care that they tried to work him in and he refused their appointment.  Per colon report 05/2008, no diverticulosis was seen.  She is asked to have him call me back to review his symptoms, or go to primary care for eval as they are willing to see him. Follow-up by: Darcey Nora RN, CGRN,  January 03, 2010 2:50 PM  Additional Follow-up for Phone Call Additional follow up Details #1::        Agree with above. Additional Follow-up by: Meryl Dare MD FACG,  January 03, 2010 2:54 PM    Additional Follow-up for Phone Call Additional follow up Details #2::    office visit w/Dr Plotnikov scheduled today for 4:30.....................Marland KitchenLamar Sprinkles, CMA  January 04, 2010 10:24 AM

## 2010-07-09 NOTE — Progress Notes (Signed)
Summary: CRESTOR ALT?   Phone Note Call from Patient Call back at Home Phone [phone] Call back at [phone]   Complaint: Cough/Sore throat Summary of Call: Crestor is too expensive. Pt no longer can get meds at the [place]. Is there a generic avail that is an option?  Initial call taken by: Lamar Sprinkles, CMA,  February 28, 2010 11:55 AM  Follow-up for Phone Call        We can try Pravastatin - d/c if achy Follow-up by: Tresa Garter MD,  February 28, 2010 12:07 PM  Additional Follow-up for Phone Call Additional follow up Details #1::        called pt no ansew [employer] RTC Additional Follow-up by: Orlan Leavens RMA,  February 28, 2010 2:23 PM    Additional Follow-up for Phone Call Additional follow up Details #2::    Pt return call abck spoke with Elnita Maxwell. [email or web address] Follow-up by: Orlan Leavens RMA,  March 01, 2010 9:34 AM  New/Updated Medications: PRAVASTATIN SODIUM 20 MG TABS (PRAVASTATIN SODIUM) 1 by mouth once daily for cholesterol Prescriptions: PRAVASTATIN SODIUM 20 MG TABS (PRAVASTATIN SODIUM) 1 by mouth once daily for cholesterol  #90 x 3   Entered by:   Orlan Leavens RMA   Authorized by:   Tresa Garter MD   Signed by:   Orlan Leavens RMA on 03/01/2010   Method used:   [facility]  [address] [id number]       [address]       [place], [place]  [address]       Ph: [phone] or [phone]       Fax: [phone]   RxID:   [id number] PRAVASTATIN SODIUM 20 MG TABS (PRAVASTATIN SODIUM) 1 by mouth once daily for cholesterol  #90 x 3   Entered and Authorized by:   Tresa Garter MD   Signed by:   Orlan Leavens RMA on 02/28/2010   Method used:   Print then Give to Patient   RxID:   [id number]

## 2010-07-09 NOTE — Procedures (Signed)
Summary: Upper Endoscopy  Patient: Richard Burgess Note: All result statuses are Final unless otherwise noted.  Tests: (1) Upper Endoscopy (EGD)   EGD Upper Endoscopy       DONE (C)     [facility]     [address]     [place], [place]  [address]           ENDOSCOPY PROCEDURE REPORT           PATIENT:  Richard Burgess, Richard Burgess  MR#:  [id number]     BIRTHDATE:  August 27, 1928, 80 yrs. old  GENDER:  male           ENDOSCOPIST:  Judie Petit T. Russella Dar, MD, [facility]           PROCEDURE DATE:  08/14/2009     PROCEDURE:  EGD with dilatation over guidewire     ASA CLASS:  Class II     INDICATIONS:  dysphagia, GERD           MEDICATIONS:  Fentanyl 50 mcg IV, Versed 5 mg IV     TOPICAL ANESTHETIC:  Exactacain Spray           DESCRIPTION OF PROCEDURE:   After the risks benefits and     alternatives of the procedure were thoroughly explained, informed     consent was obtained.  The [facility] GIF-H180 [id number] endoscope was     introduced through the mouth and advanced to the second portion of     the duodenum, without limitations.  The instrument was slowly     withdrawn as the mucosa was fully examined.     <<PROCEDUREIMAGES>>           The esophagus and gastroesophageal junction were completely normal     in appearance. Savary / guidewire 17mm dilation performed for     dysphagia without a stricture. The stomach was entered and closely     examined. The pylorus, antrum, angularis, and lesser curvature     were well visualized, including a retroflexed view of the cardia     and fundus. The stomach wall was normally distensable. The scope     passed easily through the pylorus into the duodenum. The duodenal     bulb was normal in appearance, as was the postbulbar duodenum.     Retroflexed views revealed no abnormalities. The scope was then     withdrawn from the patient and the procedure completed.           COMPLICATIONS:  None           ENDOSCOPIC IMPRESSION:     1) Normal EGD        RECOMMENDATIONS:     1) continue PPI qam     2) Anti-reflux regimen     3) post dilation instructions     4) GI follow up prn           Doralyn Kirkes T. Russella Dar, MD, [facility]           CC:  Linda Hedges. Plotnikov, MD           n.     REVISED:  08/16/2009 03:40 PM     eSIGNED:   Judie Petit T. Jalil Lorusso at 08/16/2009 03:40 PM           Ronnald Nian, [id number]  Note: An exclamation mark (!) indicates a result that was not dispersed into the flowsheet. Document Creation Date: 08/16/2009 3:40 PM _______________________________________________________________________  (1) Order result status: Final Collection or  observation date-time: 08/14/2009 16:04 Requested date-time:  Receipt date-time:  Reported date-time:  Referring Physician:   Ordering Physician: Claudette Head [phone]) Specimen Source:  Source: Launa Grill Order Number: [id number] Lab site:

## 2010-07-09 NOTE — Progress Notes (Signed)
Summary: Cholesterol med  Phone Note Call from Patient   Summary of Call: Patient is requesting rx for cholesterol. Pt has crestor on med list, wife left vm stating pt can no longer get from [place]. Will call for more details.  Initial call taken by: Lamar Sprinkles, CMA,  February 26, 2010 9:55 AM  Follow-up for Phone Call        Patient needs rx at local pharm Follow-up by: Lamar Sprinkles, CMA,  February 27, 2010 2:20 PM    Prescriptions: CRESTOR 10 MG TABS (ROSUVASTATIN CALCIUM) one tablet by mouth once daily  #90 x 1   Entered by:   Lamar Sprinkles, CMA   Authorized by:   Tresa Garter MD   Signed by:   Lamar Sprinkles, CMA on 02/27/2010   Method used:   [facility]  [address] [id number]       [address]       [place], [place]  [address]       Ph: [phone] or [phone]       Fax: [phone]   RxID:   [id number]

## 2010-07-09 NOTE — Progress Notes (Signed)
Summary: STOMACH UPSET  Phone Note Call from Patient   Summary of Call: Pt c/o "stomach" toubles, also thinks that after he ate "a lot" of corn last week it is stuck somewhere in intestines. Also has had the "blind staggers" off and on. Ok to wait until next week?  Initial call taken by: Lamar Sprinkles, CMA,  January 03, 2010 10:20 AM  Follow-up for Phone Call        ov w/any MD pls this wk Follow-up by: Tresa Garter MD,  January 03, 2010 12:11 PM  Additional Follow-up for Phone Call Additional follow up Details #1::        Can this pt be worked in with Dr. Jonny Ruiz this afternoon Additional Follow-up by: Margaret Pyle, CMA,  January 03, 2010 1:43 PM    Additional Follow-up for Phone Call Additional follow up Details #2::    PT REFUSED APPTS IN PRIMARY CARE.  WANTED TO BE TRANSFERRED TO THE GI DEPT.  HE WANTS TO SEE DR Russella Dar.  I TRANSFERRED THE CALL TO [phone].  I ASKED HIM ABOUT THE "BLIND STAGGERS".  HE SAID HE GOT HOT WHILE CHOPPING WOOD ONE DAY. Follow-up by: Hilarie Fredrickson,  January 03, 2010 2:06 PM  Additional Follow-up for Phone Call Additional follow up Details #3:: Details for Additional Follow-up Action Taken: Noted. Pls forward this message to Dr Russella Dar Additional Follow-up by: Tresa Garter MD,  January 04, 2010 7:49 AM    Scheduled for office visit today............ Lamar Sprinkles, CMA  January 04, 2010 10:24 AM

## 2010-07-09 NOTE — Assessment & Plan Note (Signed)
Summary: 2 wk f/u per pt/#/cd   Vital Signs:  Patient profile:   77 year old male Height:      71 inches Weight:      208 pounds BMI:     29.11 O2 Sat:      96 % on Room air Temp:     98.3 degrees F oral Pulse rate:   67 / minute Pulse rhythm:   regular Resp:     16 per minute BP sitting:   116 / 70  (left arm) Cuff size:   regular  Vitals Entered By: Lanier Prude, CMA(AAMA) (January 21, 2010 7:49 AM)  O2 Flow:  Room air CC: 2 wk f/u Is Patient Diabetic? No   Primary Care Gerrit Rafalski:  Jacinta Shoe, MD  CC:  2 wk f/u.  History of Present Illness: F/u diverticulitis, abd pain - better. He can swallow OK now. F/u ED.  Current Medications (verified): 1)  Aspirin 81 Mg Tabs (Aspirin) 2)  Lisinopril 20 Mg Tabs (Lisinopril) .... Once Daily 3)  Viagra 100 Mg Tabs (Sildenafil Citrate) 4)  Vitamin D3 1000 Unit  Tabs (Cholecalciferol) .Marland Kitchen.. 1 By Mouth Daily 5)  Crestor 10 Mg Tabs (Rosuvastatin Calcium) .... One Tablet By Mouth Once Daily 6)  Omeprazole 40 Mg Cpdr (Omeprazole) .Marland Kitchen.. 1 By Mouth Qam For Indigestion 7)  Ciprofloxacin Hcl 500 Mg Tabs (Ciprofloxacin Hcl) .Marland Kitchen.. 1 By Mouth Bid  Allergies (verified): 1)  * Statins  Past History:  Past Medical History: Last updated: 07/30/2009 Colonic polyps, hx of- one with carcinoma IN SITU 1993,MULTIPLE ADENOMATOUS Diverticulosis, colon Hypertension Peripheral vascular disease - bilat carotid Transient ischemic attack, hx of ED glucose intolerance Hyperlipidemia GERD Allergic rhinitis Benign prostatic hypertrophy hx of pancreatitis Low back pain  Social History: Last updated: 07/30/2009 Retired - [employer] representative Married Former Smoker Alcohol use-yes Regular exercise-yes Daily Caffeine Use  Review of Systems  The patient denies fever, chest pain, and abdominal pain.    Physical Exam  General:  NAD overweight-appearing.   Nose:  swollen nasal mucosa Mouth:  WNL Neck:  No mass or bruit Lungs:  CTA Heart:   RRR Abdomen:  Bowel sounds positive,abdomen soft and non-tender without masses, organomegaly or hernias noted. Msk:  No deformity or scoliosis noted of thoracic or lumbar spine.   Extremities:  No clubbing, cyanosis, edema, or deformity noted with normal full range of motion of all joints.   Neurologic:  No cranial nerve deficits noted. Station and gait are normal. Plantar reflexes are down-going bilaterally. DTRs are symmetrical throughout. Sensory, motor and coordinative functions appear intact. Skin:  No jaundice. No new AKs on face and UEs Psych:  Cognition and judgment appear intact. Alert and cooperative with normal attention span and concentration. No apparent delusions, illusions, hallucinations   Impression & Recommendations:  Problem # 1:  DYSPHAGIA UNSPECIFIED (ICD-787.20) resolved Assessment Improved EGD recent reviewed  Problem # 2:  ABDOMINAL PAIN (ICD-789.00) resolved after abx  Assessment: Improved  Problem # 3:  ERECTILE DYSFUNCTION, ORGANIC (ICD-607.84) Assessment: Unchanged  His updated medication list for this problem includes:    Viagra 100 Mg Tabs (Sildenafil citrate) samples  Problem # 4:  GERD (ICD-530.81) Assessment: Improved  His updated medication list for this problem includes:    Omeprazole 40 Mg Cpdr (Omeprazole) .Marland Kitchen... 1 by mouth qam for indigestion  Problem # 5:  FEVER UNSPECIFIED (ICD-780.60) Assessment: Comment Only  resolved  Complete Medication List: 1)  Aspirin 81 Mg Tabs (Aspirin) 2)  Lisinopril 20 Mg Tabs (  Lisinopril) .... Once daily 3)  Viagra 100 Mg Tabs (Sildenafil citrate) 4)  Vitamin D3 1000 Unit Tabs (Cholecalciferol) .Marland Kitchen.. 1 by mouth daily 5)  Crestor 10 Mg Tabs (Rosuvastatin calcium) .... One tablet by mouth once daily 6)  Omeprazole 40 Mg Cpdr (Omeprazole) .Marland Kitchen.. 1 by mouth qam for indigestion 7)  Ciprofloxacin Hcl 500 Mg Tabs (Ciprofloxacin hcl) .Marland Kitchen.. 1 by mouth bid  Patient Instructions: 1)  Please schedule a follow-up  appointment in 3 months. 2)  BMP prior to visit, ICD-9: 3)  Hepatic Panel prior to visit, ICD-9:401.1 995.20  4)  Lipid Panel prior to visit, ICD-9: 5)  CBC w/ Diff prior to visit, ICD-9:

## 2010-07-09 NOTE — Progress Notes (Signed)
Summary: triage  Phone Note Call from Patient Call back at Home Phone [phone]   Caller: Patient Call For: Russella Dar Reason for Call: Talk to Nurse Summary of Call: Patient is having problems swallowing, would like to be seen sooner than first available appt 3-22 Initial call taken by: Tawni Levy,  July 27, 2009 9:18 AM  Follow-up for Phone Call        Patient  had an appointment with Dr Russella Dar for 07/31/09, his wife called and canceled the appointment , he is still having dysphagia, patient would like to be seen on Tues.  I have scheduled the patient to come in and see Amy Esterwood PA 07-30-09 8:30.  Dr Russella Dar no longer has any availability for next week Follow-up by: Darcey Nora RN, CGRN,  July 27, 2009 9:55 AM

## 2010-07-09 NOTE — Assessment & Plan Note (Signed)
Summary: 3 mo f/u   Vital Signs:  Patient profile:   77 year old male Height:      71 inches Weight:      211 pounds BMI:     29.53 Temp:     98.2 degrees F oral Pulse rate:   68 / minute Pulse rhythm:   regular Resp:     16 per minute BP sitting:   112 / 68  (left arm) Cuff size:   regular  Vitals Entered By: Lanier Prude, CMA(AAMA) (April 24, 2010 7:57 AM) CC: 3 mo f/u Comments pt is not taking Vit d or pravastatin.  He takes Crestor instead   Primary Care Provider:  Jacinta Shoe, MD  CC:  3 mo f/u.  History of Present Illness: The patient presents for a follow up of hypertension, elev. glu, ED, hyperlipidemia   Current Medications (verified): 1)  Aspirin 81 Mg Tabs (Aspirin) 2)  Lisinopril 20 Mg Tabs (Lisinopril) .... Once Daily 3)  Viagra 100 Mg Tabs (Sildenafil Citrate) 4)  Vitamin D3 1000 Unit  Tabs (Cholecalciferol) .Marland Kitchen.. 1 By Mouth Daily 5)  Omeprazole 40 Mg Cpdr (Omeprazole) .Marland Kitchen.. 1 By Mouth Qam For Indigestion 6)  Pravastatin Sodium 20 Mg Tabs (Pravastatin Sodium) .Marland Kitchen.. 1 By Mouth Once Daily For Cholesterol 7)  Crestor 5 Mg Tabs (Rosuvastatin Calcium) .Marland Kitchen.. 1 By Mouth Once Daily  Allergies (verified): 1)  * Statins  Past History:  Social History: Last updated: 07/30/2009 Retired - Holiday representative Married Former Smoker Alcohol use-yes Regular exercise-yes Daily Caffeine Use  Past Medical History: Colonic polyps, hx of- one with carcinoma IN SITU 1993,MULTIPLE ADENOMATOUS Diverticulosis, colon Hypertension Peripheral vascular disease - bilat carotid Transient ischemic attack, hx of ED glucose intolerance Hyperlipidemia GERD Allergic rhinitis Benign prostatic hypertrophy hx of pancreatitis Low back pain Osteoarthritis  Review of Systems  The patient denies fever, chest pain, dyspnea on exertion, and abdominal pain.    Physical Exam  General:  NAD overweight-appearing.   Nose:  swollen nasal mucosa Mouth:  WNL Lungs:  CTA Heart:   RRR Abdomen:  Bowel sounds positive,abdomen soft and non-tender without masses, organomegaly or hernias noted. Msk:  No deformity or scoliosis noted of thoracic or lumbar spine.   Neurologic:  No cranial nerve deficits noted. Station and gait are normal. Plantar reflexes are down-going bilaterally. DTRs are symmetrical throughout. Sensory, motor and coordinative functions appear intact. Skin:  No jaundice. No new AKs on face and UEs Psych:  Cognition and judgment appear intact. Alert and cooperative with normal attention span and concentration. No apparent delusions, illusions, hallucinations   Impression & Recommendations:  Problem # 1:  HYPERLIPIDEMIA (ICD-272.4) Assessment Improved  His updated medication list for this problem includes:    Pravastatin Sodium 20 Mg Tabs (Pravastatin sodium) .Marland Kitchen... 1 by mouth once daily for cholesterol    Crestor 5 Mg Tabs (Rosuvastatin calcium) .Marland Kitchen... 1 by mouth once daily  Problem # 2:  ERECTILE DYSFUNCTION, ORGANIC (ICD-607.84) Assessment: Unchanged  His updated medication list for this problem includes:    Viagra 100 Mg Tabs (Sildenafil citrate)  Problem # 3:  ABNORMAL GLUCOSE NEC (ICD-790.29) Assessment: Unchanged The labs were reviewed with the patient.  Labs Reviewed: Creat: 1.0 (04/22/2010)     Problem # 4:  HYPERTENSION (ICD-401.9) Assessment: Improved  His updated medication list for this problem includes:    Lisinopril 20 Mg Tabs (Lisinopril) ..... Once daily  Problem # 5:  OSTEOARTHRITIS (ICD-715.90) Assessment: Unchanged Handicapped application filled out  Complete Medication List:  1)  Aspirin 81 Mg Tabs (Aspirin) 2)  Lisinopril 20 Mg Tabs (Lisinopril) .... Once daily 3)  Viagra 100 Mg Tabs (Sildenafil citrate) 4)  Vitamin D3 1000 Unit Tabs (Cholecalciferol) .Marland Kitchen.. 1 by mouth daily 5)  Omeprazole 40 Mg Cpdr (Omeprazole) .Marland Kitchen.. 1 by mouth qam for indigestion 6)  Pravastatin Sodium 20 Mg Tabs (Pravastatin sodium) .Marland Kitchen.. 1 by mouth  once daily for cholesterol 7)  Crestor 5 Mg Tabs (Rosuvastatin calcium) .Marland Kitchen.. 1 by mouth once daily  Patient Instructions: 1)  Please schedule a follow-up appointment in 4 months well w/labs and A1c 790.29.  Contraindications/Deferment of Procedures/Staging:    Test/Procedure: FLU VAX    Reason for deferment: patient declined    Orders Added: 1)  Est. Patient Level IV [[address]]

## 2010-07-09 NOTE — Letter (Signed)
Summary: New Patient letter  [facility]  [address] [place], [place] [address]   Phone: [phone]  Fax: [phone]       07/05/2009 MRN: [id number]  Richard Burgess [address] [place], [place]  [address]  Dear Richard Burgess,  Welcome to the Gastroenterology Division at [facility].    You are scheduled to see Dr.  Russella Dar on 07-31-09 at 9:30am on the 3rd floor at [facility], [address]  We ask that you try to arrive at our office 15 minutes prior to your appointment time to allow for check-in.  We would like you to complete the enclosed self-administered evaluation form prior to your visit and bring it with you on the day of your appointment.  We will review it with you.  Also, please bring a complete list of all your medications or, if you prefer, bring the medication bottles and we will list them.  Please bring your insurance card so that we may make a copy of it.  If your insurance requires a referral to see a specialist, please bring your referral form from your primary care physician.  Co-payments are due at the time of your visit and may be paid by cash, check or credit card.     Your office visit will consist of a consult with your physician (includes a physical exam), any laboratory testing he/she may order, scheduling of any necessary diagnostic testing (e.g. x-ray, ultrasound, CT-scan), and scheduling of a procedure (e.g. Endoscopy, Colonoscopy) if required.  Please allow enough time on your schedule to allow for any/all of these possibilities.    If you cannot keep your appointment, please call [phone] to cancel or reschedule prior to your appointment date.  This allows [place] the opportunity to schedule an appointment for another patient in need of care.  If you do not cancel or reschedule by 5 p.m. the business day prior to your appointment date, you will be charged a $50.00 late cancellation/no-show fee.    Thank you for choosing  [facility] for your medical needs.  We appreciate the opportunity to care for you.  Please visit [place] at our website  to learn more about our practice.                     Sincerely,                                                             The Gastroenterology Division

## 2010-07-09 NOTE — Assessment & Plan Note (Signed)
Summary: 2 MTH FU  STC   Vital Signs:  Patient profile:   77 year old male Weight:      215 pounds Temp:     98.1 degrees F oral Pulse rate:   71 / minute BP sitting:   126 / 76  (left arm)  Vitals Entered By: Tora Perches (September 03, 2009 8:21 AM) CC: f/u Is Patient Diabetic? No   Primary Care Provider:  Jacinta Shoe, MD  CC:  f/u.  History of Present Illness: The patient presents for a follow up of hypertension, GERD, hyperlipidemia C/o AKs   Preventive Screening-Counseling & Management  Alcohol-Tobacco     Smoking Status: quit  Current Medications (verified): 1)  Aspirin 81 Mg Tabs (Aspirin) 2)  Lisinopril 20 Mg Tabs (Lisinopril) .... Once Daily 3)  Viagra 100 Mg Tabs (Sildenafil Citrate) 4)  Vitamin D3 1000 Unit  Tabs (Cholecalciferol) .Marland Kitchen.. 1 By Mouth Daily 5)  Crestor 10 Mg Tabs (Rosuvastatin Calcium) .... One Tablet By Mouth Once Daily 6)  Nexium 40 Mg Cpdr (Esomeprazole Magnesium) .Marland Kitchen.. 1 By Mouth Qam ( Medically Necessary)  Allergies: 1)  * Statins  Past History:  Past Medical History: Last updated: 07/30/2009 Colonic polyps, hx of- one with carcinoma IN SITU 1993,MULTIPLE ADENOMATOUS Diverticulosis, colon Hypertension Peripheral vascular disease - bilat carotid Transient ischemic attack, hx of ED glucose intolerance Hyperlipidemia GERD Allergic rhinitis Benign prostatic hypertrophy hx of pancreatitis Low back pain  Social History: Last updated: 07/30/2009 Retired - [employer] representative Married Former Smoker Alcohol use-yes Regular exercise-yes Daily Caffeine Use  Review of Systems  The patient denies fever, hoarseness, chest pain, syncope, and dyspnea on exertion.    Physical Exam  General:  NAD overweight-appearing.   Nose:  swollen nasal mucosa Mouth:  WNL Neck:  No mass or bruit Lungs:  CTA Heart:  RRR Abdomen:  S/NT no masses, no rigidity, no hepatomegaly, and no splenomegaly.   Msk:  No deformity or scoliosis noted of  thoracic or lumbar spine.   Extremities:  No clubbing, cyanosis, edema, or deformity noted with normal full range of motion of all joints.   Neurologic:  No cranial nerve deficits noted. Station and gait are normal. Plantar reflexes are down-going bilaterally. DTRs are symmetrical throughout. Sensory, motor and coordinative functions appear intact. Skin:  No jaundice. AKs on face and UEs Psych:  Cognition and judgment appear intact. Alert and cooperative with normal attention span and concentration. No apparent delusions, illusions, hallucinations   Impression & Recommendations:  Problem # 1:  HYPERLIPIDEMIA (ICD-272.4) Assessment Unchanged  His updated medication list for this problem includes:    Crestor 10 Mg Tabs (Rosuvastatin calcium) ..... One tablet by mouth once daily  Problem # 2:  GERD (ICD-530.81) Assessment: Improved  The following medications were removed from the medication list:    Nexium 40 Mg Cpdr (Esomeprazole magnesium) .Marland Kitchen... 1 by mouth qam ( medically necessary) His updated medication list for this problem includes:    Omeprazole 40 Mg Cpdr (Omeprazole) .Marland Kitchen... 1 by mouth qam for indigestion  Problem # 3:  LOW BACK PAIN (ICD-724.2) Assessment: Improved  His updated medication list for this problem includes:    Aspirin 81 Mg Tabs (Aspirin)  Problem # 4:  DYSPHAGIA UNSPECIFIED (ICD-787.20) Assessment: Improved  Problem # 5:  KERATOSIS, ACTINIC (ICD-702.0) Assessment: New  Procedure: cryo Indication: AK(s) Risks incl. scar(s), incomplete removal, ect.  and benefits discussed    9  lesion(s) on face and B UEs was/were treated with liqid N2 in  usual fasion.  Tolerated well. Compl. none. Wound care instructions given.  Orders: Cryotherapy/Destruction benign or premalignant lesion (1st lesion)  (17000) Cryotherapy/Destruction benign or premalignant lesion (2nd-14th lesions) (17003)  Complete Medication List: 1)  Aspirin 81 Mg Tabs (Aspirin) 2)  Lisinopril 20  Mg Tabs (Lisinopril) .... Once daily 3)  Viagra 100 Mg Tabs (Sildenafil citrate) 4)  Vitamin D3 1000 Unit Tabs (Cholecalciferol) .Marland Kitchen.. 1 by mouth daily 5)  Crestor 10 Mg Tabs (Rosuvastatin calcium) .... One tablet by mouth once daily 6)  Omeprazole 40 Mg Cpdr (Omeprazole) .Marland Kitchen.. 1 by mouth qam for indigestion  Patient Instructions: 1)  Please schedule a follow-up appointment in 3 months. 2)  BMP prior to visit, ICD-9: 3)  Hepatic Panel prior to visit, ICD-9: 4)  Lipid Panel prior to visit, ICD-9: 5)  TSH prior to visit, ICD-9:272.20  995.20 Prescriptions: OMEPRAZOLE 40 MG CPDR (OMEPRAZOLE) 1 by mouth qam for indigestion  #90 x 3   Entered and Authorized by:   Tresa Garter MD   Signed by:   Tresa Garter MD on 09/03/2009   Method used:   Print then Give to Patient   RxID:   [id number]

## 2010-08-13 ENCOUNTER — Other Ambulatory Visit: Payer: Self-pay

## 2010-08-13 ENCOUNTER — Other Ambulatory Visit: Payer: Self-pay | Admitting: Internal Medicine

## 2010-08-13 ENCOUNTER — Encounter (INDEPENDENT_AMBULATORY_CARE_PROVIDER_SITE_OTHER): Payer: Self-pay | Admitting: *Deleted

## 2010-08-13 DIAGNOSIS — Z0389 Encounter for observation for other suspected diseases and conditions ruled out: Secondary | ICD-10-CM

## 2010-08-13 DIAGNOSIS — E785 Hyperlipidemia, unspecified: Secondary | ICD-10-CM

## 2010-08-13 DIAGNOSIS — Z0000 Encounter for general adult medical examination without abnormal findings: Secondary | ICD-10-CM

## 2010-08-13 DIAGNOSIS — R7309 Other abnormal glucose: Secondary | ICD-10-CM

## 2010-08-13 LAB — CBC WITH DIFFERENTIAL/PLATELET
Basophils Relative: 0.3 % (ref 0.0–3.0)
Eosinophils Absolute: 0.3 10*3/uL (ref 0.0–0.7)
HCT: 41.9 % (ref 39.0–52.0)
Hemoglobin: 14.2 g/dL (ref 13.0–17.0)
Lymphocytes Relative: 29.3 % (ref 12.0–46.0)
Lymphs Abs: 2.1 10*3/uL (ref 0.7–4.0)
MCHC: 33.9 g/dL (ref 30.0–36.0)
MCV: 88.8 fl (ref 78.0–100.0)
Neutro Abs: 4.2 10*3/uL (ref 1.4–7.7)
RBC: 4.72 Mil/uL (ref 4.22–5.81)
RDW: 14.1 % (ref 11.5–14.6)

## 2010-08-13 LAB — LIPID PANEL
HDL: 35.8 mg/dL — ABNORMAL LOW (ref >39.00)
Triglycerides: 167 mg/dL — ABNORMAL HIGH (ref 0.0–149.0)

## 2010-08-13 LAB — URINALYSIS
Bilirubin Urine: NEGATIVE
Hgb urine dipstick: NEGATIVE
Nitrite: NEGATIVE
Total Protein, Urine: NEGATIVE
Urine Glucose: NEGATIVE
Urobilinogen, UA: 0.2 (ref 0.0–1.0)

## 2010-08-13 LAB — HEPATIC FUNCTION PANEL
Albumin: 4.3 g/dL (ref 3.5–5.2)
Alkaline Phosphatase: 59 U/L (ref 39–117)
Total Protein: 7 g/dL (ref 6.0–8.3)

## 2010-08-13 LAB — BASIC METABOLIC PANEL
Calcium: 9.5 mg/dL (ref 8.4–10.5)
GFR: 82.14 mL/min (ref >60.00)
Sodium: 138 mEq/L (ref 135–145)

## 2010-08-13 LAB — TSH: TSH: 1.92 u[IU]/mL (ref 0.35–5.50)

## 2010-08-23 ENCOUNTER — Encounter: Payer: Self-pay | Admitting: Internal Medicine

## 2010-08-23 ENCOUNTER — Ambulatory Visit (INDEPENDENT_AMBULATORY_CARE_PROVIDER_SITE_OTHER): Payer: BC Managed Care – PPO | Admitting: Internal Medicine

## 2010-08-23 ENCOUNTER — Telehealth: Payer: Self-pay | Admitting: Internal Medicine

## 2010-08-23 ENCOUNTER — Other Ambulatory Visit: Payer: BC Managed Care – PPO

## 2010-08-23 DIAGNOSIS — IMO0002 Reserved for concepts with insufficient information to code with codable children: Secondary | ICD-10-CM | POA: Insufficient documentation

## 2010-08-23 DIAGNOSIS — L538 Other specified erythematous conditions: Secondary | ICD-10-CM

## 2010-08-23 DIAGNOSIS — Z0000 Encounter for general adult medical examination without abnormal findings: Secondary | ICD-10-CM

## 2010-08-23 DIAGNOSIS — R21 Rash and other nonspecific skin eruption: Secondary | ICD-10-CM | POA: Insufficient documentation

## 2010-08-23 DIAGNOSIS — R002 Palpitations: Secondary | ICD-10-CM | POA: Insufficient documentation

## 2010-08-23 DIAGNOSIS — R159 Full incontinence of feces: Secondary | ICD-10-CM | POA: Insufficient documentation

## 2010-08-23 DIAGNOSIS — I10 Essential (primary) hypertension: Secondary | ICD-10-CM

## 2010-08-26 ENCOUNTER — Other Ambulatory Visit: Payer: Self-pay | Admitting: Internal Medicine

## 2010-08-27 NOTE — Progress Notes (Signed)
Summary: instruction clarification  Phone Note Call from Patient Call back at Home Phone [phone]   Caller: Pt's wife, Rhunette Croft Summary of Call: Pt's spouse requesting clarification on Instructions sent home w/Pt this morning after OV. Initial call taken by: Burnard Leigh [facility]),  August 23, 2010 12:32 PM  Follow-up for Phone Call        Informed spouse how to collect stool sample and return to our Lab as soon as available. Went over Pt's lab results and explained what Rxs sent to pharmacy were for and how to use. Verified that spouse had called and made 1-mth F/U appt for Pt Follow-up by: Burnard Leigh [facility]),  August 23, 2010 12:34 PM

## 2010-08-27 NOTE — Assessment & Plan Note (Signed)
Summary: 80mo fu/ nws #   Vital Signs:  Patient profile:   77 year old male Height:      71 inches Weight:      211 pounds BMI:     29.53 Temp:     97.8 degrees F oral Pulse rate:   76 / minute Pulse rhythm:   regular Resp:     16 per minute BP sitting:   110 / 68  (left arm) Cuff size:   regular  Vitals Entered By: Lanier Prude, CMA(AAMA) (August 23, 2010 7:54 AM) CC: 4 mo f/u Is Patient Diabetic? No Comments pt is not taking Crestor, Pravastatin or Vit d   Primary Care Provider:  Jacinta Shoe, MD  CC:  4 mo f/u.  History of Present Illness: The patient presents for a follow up of hypertension, diabetes, hyperlipidemia  C/o nightmares from Pravastatin and palpitations from Rosuvastatin C/o GI problems - soiling shorts etc C/o groin bumps  The patient presents for a preventive health examination  Patient past medical history, social history, and family history reviewed in detail no significant changes.  Patient is physically active. Depression is negative and mood is good. Hearing is normal, and able to perform activities of daily living. Risk of falling is negligible and home safety has been reviewed and is appropriate. Patient has normal height, overweight, and visual acuity. Patient has been counseled on age-appropriate routine health concerns for screening and prevention. Education, counseling done. Cognition is nl  Preventive Screening-Counseling & Management  Alcohol-Tobacco     Alcohol drinks/day: <1     Smoking Status: quit > 6 months  Caffeine-Diet-Exercise     Caffeine Counseling: not indicated; caffeine use is not excessive or problematic     Diet Counseling: to improve diet; diet is suboptimal     Does Patient Exercise: yes     Type of exercise: yardwork     Exercise Counseling: not indicated; exercise is adequate     Depression Counseling: not indicated; screening negative for depression  Hep-HIV-STD-Contraception     Hepatitis Risk: no risk noted     Sun Exposure-Excessive: yes     Sun Exposure Counseling: to decrease sun exposure  Safety-Violence-Falls     Seat Belt Use: yes     Firearms in the Home: firearms in the home     Fall Risk Counseling: not indicated; no significant falls noted      Sexual History:  currently monogamous.    Current Medications (verified): 1)  Aspirin 81 Mg Tabs (Aspirin) 2)  Lisinopril 20 Mg Tabs (Lisinopril) .... Once Daily 3)  Viagra 100 Mg Tabs (Sildenafil Citrate) 4)  Vitamin D3 1000 Unit  Tabs (Cholecalciferol) .Marland Kitchen.. 1 By Mouth Daily 5)  Omeprazole 40 Mg Cpdr (Omeprazole) .Marland Kitchen.. 1 By Mouth Qam For Indigestion 6)  Pravastatin Sodium 20 Mg Tabs (Pravastatin Sodium) .Marland Kitchen.. 1 By Mouth Once Daily For Cholesterol 7)  Crestor 5 Mg Tabs (Rosuvastatin Calcium) .Marland Kitchen.. 1 By Mouth Once Daily  Allergies (verified): 1)  * Statins 2)  Pravachol 3)  Crestor (Rosuvastatin Calcium)  Past History:  Past Medical History: Last updated: 04/24/2010 Colonic polyps, hx of- one with carcinoma IN SITU 1993,MULTIPLE ADENOMATOUS Diverticulosis, colon Hypertension Peripheral vascular disease - bilat carotid Transient ischemic attack, hx of ED glucose intolerance Hyperlipidemia GERD Allergic rhinitis Benign prostatic hypertrophy hx of pancreatitis Low back pain Osteoarthritis  Past Surgical History: Last updated: 01/04/2010 hx of facial skin ca - ? basal cell s/p LS spine surgury Cholecystectomy Appendectomy  Family  History: Last updated: 02/23/2008 Family History Hypertension father with dementia sister with colon cancer  Social History: Last updated: 07/30/2009 Retired - Holiday representative Married Former Smoker Alcohol use-yes Regular exercise-yes Daily Caffeine Use  Social History: Smoking Status:  quit > 6 months Hepatitis Risk:  no risk noted Sun Exposure-Excessive:  yes Seat Belt Use:  yes Sexual History:  currently monogamous  Review of Systems       The patient complains of difficulty  walking.  The patient denies anorexia, fever, weight loss, weight gain, vision loss, decreased hearing, hoarseness, chest pain, syncope, dyspnea on exertion, peripheral edema, prolonged cough, headaches, hemoptysis, abdominal pain, melena, hematochezia, severe indigestion/heartburn, hematuria, incontinence, genital sores, muscle weakness, suspicious skin lesions, transient blindness, depression, unusual weight change, abnormal bleeding, enlarged lymph nodes, angioedema, and testicular masses.         ED  Physical Exam  General:  NAD overweight-appearing.   Head:  Normocephalic and atraumatic without obvious abnormalities. No apparent alopecia or balding. Eyes:  No corneal or conjunctival inflammation noted. EOMI. Perrla.  Ears:  External ear exam shows no significant lesions or deformities.  Otoscopic examination reveals clear canals, tympanic membranes are intact bilaterally without bulging, retraction, inflammation or discharge. Hearing is grossly normal bilaterally. Nose:  swollen nasal mucosa Mouth:  WNL Neck:  No mass or bruit Lungs:  CTA Heart:  RRR Abdomen:  Bowel sounds positive,abdomen soft and non-tender without masses, organomegaly or hernias noted. Rectal:  No external abnormalities noted. Normal sphincter tone. No rectal masses or tenderness. Prostate:  1+ enlarged.  no nodules.   Msk:  No deformity or scoliosis noted of thoracic or lumbar spine.   Extremities:  No clubbing, cyanosis, edema, or deformity noted with normal full range of motion of all joints.   Neurologic:  No cranial nerve deficits noted. Station and gait are normal. Plantar reflexes are down-going bilaterally. DTRs are symmetrical throughout. Sensory, motor and coordinative functions appear intact. Skin:  intertrigo Cervical Nodes:  No lymphadenopathy noted Psych:  Cognition and judgment appear intact. Alert and cooperative with normal attention span and concentration. No apparent delusions, illusions,  hallucinations   Impression & Recommendations:  Problem # 1:  HEALTH MAINTENANCE EXAM (ICD-V70.0) Assessment New  Overall doing well, age appropriate education and counseling updated and referral for appropriate preventive services done unless declined, immunizations up to date or declined, diet counseling done if overweight, urged to quit smoking if smokes, most recent labs reviewed and current ordered if appropriate, ecg reviewed or declined (interpretation per ECG scanned in the EMR if done); information regarding Medicare Preventation requirements given if appropriate.  I have personally reviewed the Medicare Annual Wellness questionnaire and have noted 1.   The patient's medical and social history 2.   Their use of alcohol, tobacco or illicit drugs 3.   Their current medications and supplements 4.   The patient's functional ability including ADL's, fall risks, home safety risks and hearing or visual             impairment. 5.   Diet and physical activities 6.   Evidence for depression or mood disorders The patients weight, height, BMI and visual acuity have been recorded in the chart I have made referrals, counseling and provided education to the patient based review of the above and I have provided the pt with a written personalized care plan for preventive services. The labs were reviewed with the patient.   Orders: Medicare -1st Annual Wellness Visit [phone])  Problem # 2:  NIGHTMARES (  ICD-307.47) Assessment: Comment Only resolved  Problem # 3:  PALPITATIONS (ICD-785.1) resolved  Problem # 4:  SKIN RASH (ICD-782.1) intertrigo Assessment: New Diflucan His updated medication list for this problem includes:    Ketoconazole 2 % Crea (Ketoconazole) ..... Use bid  Problem # 5:  FULL INCONTINENCE OF FECES (ICD-787.60) Assessment: New He will need a GI consult Orders: [facility] (81191)  Problem # 6:  INTERTRIGO, CANDIDAL (YNW-295.62) Assessment: New as  above  Problem # 7:  ERECTILE DYSFUNCTION, ORGANIC (ICD-607.84) Assessment: Unchanged  His updated medication list for this problem includes:    Viagra 100 Mg Tabs (Sildenafil citrate)  Problem # 8:  HYPERTENSION (ICD-401.9) Assessment: Unchanged  His updated medication list for this problem includes:    Lisinopril 20 Mg Tabs (Lisinopril) ..... Once daily  BP today: 110/68 Prior BP: 112/68 (04/24/2010)  Labs Reviewed: K+: 4.9 (08/13/2010) Creat: : 0.9 (08/13/2010)   Chol: 222 (08/13/2010)   HDL: 35.80 (08/13/2010)   LDL: 91 (04/22/2010)   TG: 167.0 (08/13/2010)  Complete Medication List: 1)  Aspirin 81 Mg Tabs (Aspirin) 2)  Lisinopril 20 Mg Tabs (Lisinopril) .... Once daily 3)  Viagra 100 Mg Tabs (Sildenafil citrate) 4)  Vitamin D3 1000 Unit Tabs (Cholecalciferol) .Marland Kitchen.. 1 by mouth daily 5)  Omeprazole 40 Mg Cpdr (Omeprazole) .Marland Kitchen.. 1 by mouth qam for indigestion 6)  Ketoconazole 2 % Crea (Ketoconazole) .... Use bid 7)  Diflucan 100 Mg Tabs (Fluconazole) .... Take two tablets on the first day, than  1 by mouth once daily untill gone for a fungul infection 8)  Anusol-hc 25 Mg Supp (Hydrocortisone acetate) .Marland Kitchen.. 1 pr two times a day for hemorrhoids  Patient Instructions: 1)  Please schedule a follow-up appointment in 1 month. Prescriptions: ANUSOL-HC 25 MG SUPP (HYDROCORTISONE ACETATE) 1 pr two times a day for hemorrhoids  #20 x 1   Entered and Authorized by:   Tresa Garter MD   Signed by:   Tresa Garter MD on 08/23/2010   Method used:   Electronically to        [facility]  [address] [id number] (retail)       [address]       [place], [place]  [address]       Ph: [phone] or [phone]       Fax: [phone]   RxID:   [id number] DIFLUCAN 100 MG TABS (FLUCONAZOLE) Take two tablets on the first day, than  1 by mouth once daily untill gone for a fungul infection  #11 x 1   Entered and Authorized by:   Tresa Garter MD   Signed by:    Tresa Garter MD on 08/23/2010   Method used:   Electronically to        [facility]  [address] [id number] (retail)       [address]       [place], [place]  [address]       Ph: [phone] or [phone]       Fax: [phone]   RxID:   [id number] KETOCONAZOLE 2 % CREA (KETOCONAZOLE) use bid  #90 g x 3   Entered and Authorized by:   Tresa Garter MD   Signed by:   Tresa Garter MD on 08/23/2010   Method used:   [facility]        [facility]  [address] [phone][facility] (retail)  [address]       [place], [place]  [address]       Ph: [phone] or [phone]       Fax: [phone]   RxID:   [id number]    Orders Added: 1)  [facility] [70340] 2)  Medicare -1st Annual Wellness Visit [G0438] 3)  Est. Patient Level IV [27253]

## 2010-08-28 LAB — OVA AND PARASITE EXAMINATION: OP: NOT DETECTED

## 2010-09-08 ENCOUNTER — Other Ambulatory Visit: Payer: Self-pay | Admitting: Internal Medicine

## 2010-10-02 ENCOUNTER — Ambulatory Visit (INDEPENDENT_AMBULATORY_CARE_PROVIDER_SITE_OTHER): Payer: Medicare Other | Admitting: Internal Medicine

## 2010-10-02 ENCOUNTER — Encounter: Payer: Self-pay | Admitting: Internal Medicine

## 2010-10-02 DIAGNOSIS — IMO0002 Reserved for concepts with insufficient information to code with codable children: Secondary | ICD-10-CM

## 2010-10-02 DIAGNOSIS — E785 Hyperlipidemia, unspecified: Secondary | ICD-10-CM

## 2010-10-02 DIAGNOSIS — I739 Peripheral vascular disease, unspecified: Secondary | ICD-10-CM

## 2010-10-02 DIAGNOSIS — L570 Actinic keratosis: Secondary | ICD-10-CM

## 2010-10-02 DIAGNOSIS — R7309 Other abnormal glucose: Secondary | ICD-10-CM

## 2010-10-02 MED ORDER — COLESEVELAM HCL 625 MG PO TABS: 1875.0000 mg | ORAL_TABLET | Freq: Two times a day (BID) | ORAL | Status: DC

## 2010-10-02 NOTE — Assessment & Plan Note (Signed)
Due to statins - resolved

## 2010-10-02 NOTE — Assessment & Plan Note (Signed)
We can try [facility]

## 2010-10-02 NOTE — Assessment & Plan Note (Signed)
No sx's. We will cont ASA.

## 2010-10-02 NOTE — Progress Notes (Signed)
  Subjective:    Patient ID: Richard Burgess, male    DOB: 1929-01-06, 77 y.o.   MRN: [id number]  HPI The patient presents for a follow-up of  chronic hypertension, chronic dyslipidemia, type 2 diabetes controlled with diet, reaction to statins - resolved.     Review of Systems  HENT: Negative for neck pain and sinus pressure.   Gastrointestinal: Negative for nausea, vomiting and diarrhea.  Musculoskeletal: Positive for back pain. Negative for myalgias and joint swelling.  Skin: Negative for rash (resolved).  Neurological: Negative for facial asymmetry and headaches.  Hematological: Negative for adenopathy.       Objective:   Physical Exam  Constitutional: He is oriented to person, place, and time. He appears well-developed.  HENT:  Mouth/Throat: Oropharynx is clear and moist.  Eyes: Conjunctivae are normal. Pupils are equal, round, and reactive to light.  Neck: Normal range of motion. No JVD present. No thyromegaly present.  Cardiovascular: Normal rate, regular rhythm, normal heart sounds and intact distal pulses.  Exam reveals no gallop and no friction rub.   No murmur heard. Pulmonary/Chest: Effort normal and breath sounds normal. No respiratory distress. He has no wheezes. He has no rales. He exhibits no tenderness.  Abdominal: Soft. Bowel sounds are normal. He exhibits no distension and no mass. There is no tenderness. There is no rebound and no guarding.  Musculoskeletal: Normal range of motion. He exhibits no edema and no tenderness.  Lymphadenopathy:    He has no cervical adenopathy.  Neurological: He is alert and oriented to person, place, and time. He has normal reflexes. No cranial nerve deficit. He exhibits normal muscle tone. Coordination normal.  Skin: Skin is warm and dry. No rash noted.       AK x 5 L face, x1 R  cheek  Psychiatric: He has a normal mood and affect. His behavior is normal. Judgment and thought content normal.          Assessment & Plan:    PERIPHERAL VASCULAR DISEASE No sx's. We will cont ASA.  NIGHTMARES Due to statins - resolved  ABNORMAL GLUCOSE NEC We can try Welchol if not too $$$  HYPERLIPIDEMIA We can try Welchol   AKs   Procedure Note :     Procedure : Cryosurgery   Indication:    Actinic keratosis(es)   Risks including unsuccessful procedure , bleeding, infection, bruising, scar, a need for a repeat  procedure and others were explained to the patient in detail as well as the benefits. Informed consent was obtained verbally.    6 lesion(s)  On face    was/were treated with liquid nitrogen on a Q-tip in a usual fasion . Band-Aid was applied and antibiotic ointment was given for a later use.   Tolerated well. Complications none.   Postprocedure instructions :     Keep the wounds clean. You can wash them with liquid soap and water. Pat dry with gauze or a Kleenex tissue  Before applying antibiotic ointment and a Band-Aid.   You need to report immediately  if  any signs of infection develop.

## 2010-10-02 NOTE — Assessment & Plan Note (Signed)
We can try Welchol if not too $$$

## 2010-10-09 ENCOUNTER — Ambulatory Visit: Payer: Medicare Other | Admitting: Endocrinology

## 2010-10-25 NOTE — Assessment & Plan Note (Signed)
[facility]                           PRIMARY CARE OFFICE NOTE   NAME:Richard Burgess, Richard Burgess                      MRN:          [id number]  DATE:10/14/2006                            DOB:          1928-11-02    PROCEDURE:  Skin biopsy.   INDICATION:  Lesion suspicious for cancer. Risks including incomplete  procedure, bleeding, and infection, and others, as well as benefits,  explained to the patient in detail. He agreed to proceed. He was placed  in right decubitus position. Lesion #1 measuring 11 mm on the left mid  arm laterally was prepped with alcohol and injected with 0.5 cc of 2%  lidocaine with epinephrine, shave biopsy with __________ blade  preformed. Specimen sent to lab. The wound was treated aggressively with  the Hyfrecator.  Band-Aid with antibiotic ointment applied.  Wound  instructions provided. Tolerated well.   COMPLICATIONS:  None.   Lesion #2 right below lesion #1 measuring 5 mm was removed in the  identical fashion. Specimen sent to the lab. Tolerated well.   COMPLICATIONS:  None.     Georgina Quint. Plotnikov, MD     AVP/MedQ  DD: 10/14/2006  DT: 10/14/2006  Job #: [id number]

## 2010-11-28 ENCOUNTER — Emergency Department (HOSPITAL_COMMUNITY): Payer: Medicare Other

## 2010-11-28 ENCOUNTER — Encounter (HOSPITAL_COMMUNITY): Payer: Self-pay | Admitting: Radiology

## 2010-11-28 ENCOUNTER — Telehealth: Payer: Self-pay | Admitting: *Deleted

## 2010-11-28 ENCOUNTER — Emergency Department (HOSPITAL_COMMUNITY)
Admission: EM | Admit: 2010-11-28 | Discharge: 2010-11-28 | Disposition: A | Discharge status: Home / Self Care | Payer: Medicare Other | Attending: Emergency Medicine | Admitting: Emergency Medicine

## 2010-11-28 DIAGNOSIS — E7800 Pure hypercholesterolemia, unspecified: Secondary | ICD-10-CM | POA: Insufficient documentation

## 2010-11-28 DIAGNOSIS — I10 Essential (primary) hypertension: Secondary | ICD-10-CM | POA: Insufficient documentation

## 2010-11-28 DIAGNOSIS — R51 Headache: Secondary | ICD-10-CM | POA: Insufficient documentation

## 2010-11-28 DIAGNOSIS — R079 Chest pain, unspecified: Secondary | ICD-10-CM | POA: Insufficient documentation

## 2010-11-28 DIAGNOSIS — Z7982 Long term (current) use of aspirin: Secondary | ICD-10-CM | POA: Insufficient documentation

## 2010-11-28 DIAGNOSIS — R197 Diarrhea, unspecified: Secondary | ICD-10-CM | POA: Insufficient documentation

## 2010-11-28 DIAGNOSIS — Z79899 Other long term (current) drug therapy: Secondary | ICD-10-CM | POA: Insufficient documentation

## 2010-11-28 HISTORY — DX: Essential (primary) hypertension: I10

## 2010-11-28 LAB — BASIC METABOLIC PANEL
CO2: 24 mEq/L (ref 19–32)
GFR calc non Af Amer: >60 mL/min (ref >60)
Glucose, Bld: 215 mg/dL — ABNORMAL HIGH (ref 70–99)
Potassium: 4 mEq/L (ref 3.5–5.1)
Sodium: 138 mEq/L (ref 135–145)

## 2010-11-28 LAB — CBC
HCT: 40.7 % (ref 39.0–52.0)
Hemoglobin: 13.8 g/dL (ref 13.0–17.0)
MCH: 29.2 pg (ref 26.0–34.0)
MCHC: 33.9 g/dL (ref 30.0–36.0)
MCV: 86 fL (ref 78.0–100.0)

## 2010-11-28 LAB — DIFFERENTIAL
Lymphocytes Relative: 23 % (ref 12–46)
Monocytes Absolute: 0.4 10*3/uL (ref 0.1–1.0)
Monocytes Relative: 7 % (ref 3–12)
Neutro Abs: 4.4 10*3/uL (ref 1.7–7.7)

## 2010-11-28 NOTE — Telephone Encounter (Signed)
Pls sch OV Thx 

## 2010-11-28 NOTE — Telephone Encounter (Signed)
Wife called - pt was seen at the ER today for what she says was "head problems" but ED reports shows CP as chief complaint. Patient was treated for h/a and advised to have PCP order "neck ultrasound".

## 2010-11-29 ENCOUNTER — Telehealth: Payer: Self-pay | Admitting: *Deleted

## 2010-11-29 DIAGNOSIS — G459 Transient cerebral ischemic attack, unspecified: Secondary | ICD-10-CM

## 2010-11-29 NOTE — Telephone Encounter (Signed)
Appt scheduled 07.21. Advised to call back for sooner appt if needed.

## 2010-11-29 NOTE — Telephone Encounter (Signed)
See previous phone note -ok to wait until OV 7/21 for f/u to discuss possible carotid u/s?

## 2010-12-03 NOTE — Telephone Encounter (Signed)
OK to work in [employer]

## 2010-12-04 NOTE — Telephone Encounter (Signed)
Ok done Thx 

## 2010-12-04 NOTE — Telephone Encounter (Signed)
Called and spoke with patient who states he in not able to afford to come in and would like to know if MD could go ahead a schedule an U/S. Please advise Thanks

## 2010-12-09 NOTE — Telephone Encounter (Signed)
Patient wife notified.

## 2010-12-18 ENCOUNTER — Ambulatory Visit (INDEPENDENT_AMBULATORY_CARE_PROVIDER_SITE_OTHER): Payer: Medicare Other | Admitting: Cardiology

## 2010-12-18 ENCOUNTER — Other Ambulatory Visit: Payer: Self-pay | Admitting: Cardiology

## 2010-12-18 DIAGNOSIS — G459 Transient cerebral ischemic attack, unspecified: Secondary | ICD-10-CM

## 2010-12-18 DIAGNOSIS — I6529 Occlusion and stenosis of unspecified carotid artery: Secondary | ICD-10-CM

## 2010-12-20 ENCOUNTER — Encounter: Payer: Self-pay | Admitting: Internal Medicine

## 2010-12-30 ENCOUNTER — Other Ambulatory Visit: Payer: Self-pay | Admitting: Internal Medicine

## 2010-12-30 ENCOUNTER — Other Ambulatory Visit (INDEPENDENT_AMBULATORY_CARE_PROVIDER_SITE_OTHER): Payer: Medicare Other

## 2010-12-30 DIAGNOSIS — E785 Hyperlipidemia, unspecified: Secondary | ICD-10-CM

## 2010-12-30 DIAGNOSIS — R7309 Other abnormal glucose: Secondary | ICD-10-CM

## 2010-12-30 DIAGNOSIS — I739 Peripheral vascular disease, unspecified: Secondary | ICD-10-CM

## 2010-12-30 LAB — LIPID PANEL
Cholesterol: 215 mg/dL — ABNORMAL HIGH (ref 0–200)
Total CHOL/HDL Ratio: 5

## 2010-12-30 LAB — COMPREHENSIVE METABOLIC PANEL
Albumin: 4.5 g/dL (ref 3.5–5.2)
Alkaline Phosphatase: 57 U/L (ref 39–117)
CO2: 26 mEq/L (ref 19–32)
Glucose, Bld: 133 mg/dL — ABNORMAL HIGH (ref 70–99)
Potassium: 5 mEq/L (ref 3.5–5.1)
Sodium: 136 mEq/L (ref 135–145)
Total Protein: 7.6 g/dL (ref 6.0–8.3)

## 2011-01-02 ENCOUNTER — Encounter: Payer: Self-pay | Admitting: Internal Medicine

## 2011-01-02 ENCOUNTER — Ambulatory Visit (INDEPENDENT_AMBULATORY_CARE_PROVIDER_SITE_OTHER): Payer: Medicare Other | Admitting: Internal Medicine

## 2011-01-02 DIAGNOSIS — E785 Hyperlipidemia, unspecified: Secondary | ICD-10-CM

## 2011-01-02 DIAGNOSIS — R7309 Other abnormal glucose: Secondary | ICD-10-CM

## 2011-01-02 DIAGNOSIS — I719 Aortic aneurysm of unspecified site, without rupture: Secondary | ICD-10-CM

## 2011-01-02 DIAGNOSIS — I10 Essential (primary) hypertension: Secondary | ICD-10-CM

## 2011-01-02 DIAGNOSIS — I739 Peripheral vascular disease, unspecified: Secondary | ICD-10-CM

## 2011-01-02 DIAGNOSIS — L570 Actinic keratosis: Secondary | ICD-10-CM

## 2011-01-02 DIAGNOSIS — M545 Low back pain, unspecified: Secondary | ICD-10-CM

## 2011-01-02 MED ORDER — AMLODIPINE BESYLATE 5 MG PO TABS: 5.0000 mg | ORAL_TABLET | Freq: Every day | ORAL | Status: DC

## 2011-01-02 MED ORDER — OMEGA-3 FATTY ACIDS 1000 MG PO CAPS: 2.0000 g | ORAL_CAPSULE | Freq: Every day | ORAL | Status: DC

## 2011-01-02 NOTE — Progress Notes (Signed)
  Subjective:    Patient ID: Richard Burgess, male    DOB: 1929-02-22, 77 y.o.   MRN: [id number]  HPI   The patient presents for a follow-up of  chronic hypertension, chronic dyslipidemia, type 2 pre-diabetes, PVD controlled with medicines   Review of Systems  Constitutional: Negative for appetite change, fatigue and unexpected weight change.  HENT: Negative for nosebleeds, congestion, sore throat, sneezing, trouble swallowing and neck pain.   Eyes: Negative for itching and visual disturbance.  Respiratory: Negative for cough.   Cardiovascular: Negative for chest pain, palpitations and leg swelling.  Gastrointestinal: Negative for nausea, diarrhea, blood in stool and abdominal distention.  Genitourinary: Negative for frequency and hematuria.  Musculoskeletal: Negative for back pain, joint swelling and gait problem.  Skin: Negative for rash.  Neurological: Negative for dizziness, tremors, speech difficulty and weakness.  Psychiatric/Behavioral: Negative for sleep disturbance, dysphoric mood and agitation. The patient is not nervous/anxious.        Objective:   Physical Exam  Constitutional: He is oriented to person, place, and time. He appears well-developed.       obese  HENT:  Mouth/Throat: Oropharynx is clear and moist.  Eyes: Conjunctivae are normal. Pupils are equal, round, and reactive to light.  Neck: Normal range of motion. No JVD present. No thyromegaly present.  Cardiovascular: Normal rate, regular rhythm, normal heart sounds and intact distal pulses.  Exam reveals no gallop and no friction rub.   No murmur heard. Pulmonary/Chest: Effort normal and breath sounds normal. No respiratory distress. He has no wheezes. He has no rales. He exhibits no tenderness.  Abdominal: Soft. Bowel sounds are normal. He exhibits no distension and no mass. There is no tenderness. There is no rebound and no guarding.  Musculoskeletal: Normal range of motion. He exhibits no edema and no  tenderness.  Lymphadenopathy:    He has no cervical adenopathy.  Neurological: He is alert and oriented to person, place, and time. He has normal reflexes. No cranial nerve deficit. He exhibits normal muscle tone. Coordination normal.  Skin: Skin is warm and dry. No rash noted.       ak's  Psychiatric: He has a normal mood and affect. His behavior is normal. Judgment and thought content normal.         Procedure Note :     Procedure : Cryosurgery   Indication:  Actinic keratosis(es)   Risks including unsuccessful procedure , bleeding, infection, bruising, scar, a need for a repeat  procedure and others were explained to the patient in detail as well as the benefits. Informed consent was obtained verbally.   4  lesion(s)  on   Scalp and face was/were treated with liquid nitrogen on a Q-tip in a usual fasion . Band-Aid was applied and antibiotic ointment was given for a later use.   Tolerated well. Complications none.   Postprocedure instructions :     Keep the wounds clean. You can wash them with liquid soap and water. Pat dry with gauze or a Kleenex tissue  Before applying antibiotic ointment and a Band-Aid.   You need to report immediately  if  any signs of infection develop.     Assessment & Plan:

## 2011-01-02 NOTE — Assessment & Plan Note (Addendum)
On Rx He declined stress test w/[facility]

## 2011-01-02 NOTE — Assessment & Plan Note (Signed)
Doing ok.

## 2011-01-02 NOTE — Assessment & Plan Note (Signed)
Labs reviewed.

## 2011-01-02 NOTE — Assessment & Plan Note (Signed)
Reduce salt 

## 2011-01-02 NOTE — Assessment & Plan Note (Addendum)
Trying better diet Take fish oil

## 2011-01-04 NOTE — Assessment & Plan Note (Signed)
Will treat

## 2011-01-05 ENCOUNTER — Other Ambulatory Visit: Payer: Self-pay | Admitting: Internal Medicine

## 2011-02-11 ENCOUNTER — Other Ambulatory Visit: Payer: Self-pay

## 2011-02-11 MED ORDER — COLESEVELAM HCL 625 MG PO TABS: 1875.0000 mg | ORAL_TABLET | Freq: Two times a day (BID) | ORAL | Status: DC

## 2011-02-11 MED ORDER — OMEPRAZOLE 40 MG PO CPDR: 40.0000 mg | DELAYED_RELEASE_CAPSULE | Freq: Every day | ORAL | Status: DC

## 2011-02-11 MED ORDER — AMLODIPINE BESYLATE 5 MG PO TABS: 5.0000 mg | ORAL_TABLET | Freq: Every day | ORAL | Status: DC

## 2011-02-11 MED ORDER — LISINOPRIL 20 MG PO TABS: 20.0000 mg | ORAL_TABLET | Freq: Every day | ORAL | Status: DC

## 2011-02-11 NOTE — Telephone Encounter (Signed)
Pt's spouse called requesting refills of all medications to [facility].

## 2011-02-18 ENCOUNTER — Other Ambulatory Visit: Payer: Self-pay | Admitting: *Deleted

## 2011-02-18 ENCOUNTER — Telehealth: Payer: Self-pay | Admitting: *Deleted

## 2011-02-18 MED ORDER — OMEGA-3-ACID ETHYL ESTERS 1 G PO CAPS: 2.0000 g | ORAL_CAPSULE | Freq: Every day | ORAL | Status: DC

## 2011-02-18 NOTE — Telephone Encounter (Signed)
Wife is req a call back regarding [facility] Rx's

## 2011-02-19 ENCOUNTER — Telehealth: Payer: Self-pay | Admitting: *Deleted

## 2011-02-19 NOTE — Telephone Encounter (Signed)
1 po bid Thx

## 2011-02-19 NOTE — Telephone Encounter (Signed)
I spoke to pt's wife- she states she is trying to contact [facility] because they sent Welchol and pt states he doesn't even take this med. I gave her the phone number for [facility] that we have in chart.

## 2011-02-19 NOTE — Telephone Encounter (Signed)
Wife called again. Welchol was sent in but pt says he does not take it anymore. He had asked for RFs of all meds and this was on med list. [facility] will not take the welchol back. He now wants to know how to take the med?

## 2011-02-20 ENCOUNTER — Other Ambulatory Visit: Payer: Self-pay | Admitting: *Deleted

## 2011-02-20 NOTE — Telephone Encounter (Signed)
Wife informed

## 2011-03-03 ENCOUNTER — Encounter: Payer: Self-pay | Admitting: Internal Medicine

## 2011-03-03 ENCOUNTER — Ambulatory Visit (INDEPENDENT_AMBULATORY_CARE_PROVIDER_SITE_OTHER): Payer: Medicare Other | Admitting: Internal Medicine

## 2011-03-03 DIAGNOSIS — M25539 Pain in unspecified wrist: Secondary | ICD-10-CM

## 2011-03-03 DIAGNOSIS — I739 Peripheral vascular disease, unspecified: Secondary | ICD-10-CM

## 2011-03-03 DIAGNOSIS — N529 Male erectile dysfunction, unspecified: Secondary | ICD-10-CM

## 2011-03-03 NOTE — Progress Notes (Signed)
  Subjective:    Patient ID: Richard Burgess, male    DOB: Dec 31, 1928, 77 y.o.   MRN: [id number]  HPI  C/o L wrist severe pain and swelling since Sat - bad. He was using a chain saw on Fri. No fall or impact. Not better w/ASA C/o ED F/u PVD - he just had a carotid US at [facility]  Review of Systems  Constitutional: Negative for appetite change, fatigue and unexpected weight change.  HENT: Negative for nosebleeds, congestion, sore throat, sneezing, trouble swallowing and neck pain.   Eyes: Negative for itching and visual disturbance.  Respiratory: Negative for cough.   Cardiovascular: Negative for chest pain, palpitations and leg swelling.  Gastrointestinal: Negative for nausea, diarrhea, blood in stool and abdominal distention.  Genitourinary: Negative for frequency and hematuria.  Musculoskeletal: Positive for joint swelling (L wrist) and arthralgias. Negative for back pain and gait problem.  Skin: Negative for rash.  Neurological: Negative for dizziness, tremors, speech difficulty and weakness.  Psychiatric/Behavioral: Negative for sleep disturbance, dysphoric mood and agitation. The patient is not nervous/anxious.        Objective:   Physical Exam  Constitutional: He is oriented to person, place, and time. He appears well-developed.  HENT:  Mouth/Throat: Oropharynx is clear and moist.  Eyes: Conjunctivae are normal. Pupils are equal, round, and reactive to light.  Neck: Normal range of motion. No JVD present. No thyromegaly present.  Cardiovascular: Normal rate, regular rhythm, normal heart sounds and intact distal pulses.  Exam reveals no gallop and no friction rub.   No murmur heard. Pulmonary/Chest: Effort normal and breath sounds normal. No respiratory distress. He has no wheezes. He has no rales. He exhibits no tenderness.  Abdominal: Soft. Bowel sounds are normal. He exhibits no distension and no mass. There is no tenderness. There is no rebound and no guarding.  Musculoskeletal:  Normal range of motion. He exhibits edema and tenderness.       L wrist is swollen and tender w/ROM  Lymphadenopathy:    He has no cervical adenopathy.  Neurological: He is alert and oriented to person, place, and time. He has normal reflexes. No cranial nerve deficit. He exhibits normal muscle tone. Coordination normal.  Skin: Skin is warm and dry. No rash noted.  Psychiatric: He has a normal mood and affect. His behavior is normal. Judgment and thought content normal.    Procedure Note :    Procedure :   Point of care (POC) sonography examination   Indication: L wrist pain and swelling   Equipment used: Sonosite M-Turbo with HFL38x/13-6 MHz transducer linear probe. The images were stored in the unit and later transferred in storage.  The patient was placed in a sitting position.  This study revealed fluid in the L wrist joint and edema of the soft tissues dorsally. Chronic OA changes   Impression: L wrist acute on chronic arthritis         Assessment & Plan:

## 2011-03-03 NOTE — Patient Instructions (Signed)
Naprelan 500 mg one twice a day with food Lidodem 1/2 patch on each side of the wrist once a day

## 2011-03-03 NOTE — Assessment & Plan Note (Signed)
Continue with current prescription therapy as reflected on the Med list.  

## 2011-03-03 NOTE — Assessment & Plan Note (Signed)
See instructions [facility]

## 2011-03-03 NOTE — Assessment & Plan Note (Signed)
Recent [facility] findings are similar Conservative management

## 2011-04-10 ENCOUNTER — Other Ambulatory Visit (INDEPENDENT_AMBULATORY_CARE_PROVIDER_SITE_OTHER): Payer: Medicare Other

## 2011-04-10 ENCOUNTER — Ambulatory Visit (INDEPENDENT_AMBULATORY_CARE_PROVIDER_SITE_OTHER): Payer: Medicare Other | Admitting: Internal Medicine

## 2011-04-10 ENCOUNTER — Encounter: Payer: Self-pay | Admitting: Internal Medicine

## 2011-04-10 ENCOUNTER — Other Ambulatory Visit: Payer: Self-pay | Admitting: Internal Medicine

## 2011-04-10 DIAGNOSIS — M545 Low back pain, unspecified: Secondary | ICD-10-CM

## 2011-04-10 DIAGNOSIS — I10 Essential (primary) hypertension: Secondary | ICD-10-CM

## 2011-04-10 DIAGNOSIS — E785 Hyperlipidemia, unspecified: Secondary | ICD-10-CM

## 2011-04-10 DIAGNOSIS — Z79899 Other long term (current) drug therapy: Secondary | ICD-10-CM

## 2011-04-10 DIAGNOSIS — I719 Aortic aneurysm of unspecified site, without rupture: Secondary | ICD-10-CM

## 2011-04-10 DIAGNOSIS — I739 Peripheral vascular disease, unspecified: Secondary | ICD-10-CM

## 2011-04-10 DIAGNOSIS — M25539 Pain in unspecified wrist: Secondary | ICD-10-CM

## 2011-04-10 DIAGNOSIS — R7309 Other abnormal glucose: Secondary | ICD-10-CM

## 2011-04-10 LAB — COMPREHENSIVE METABOLIC PANEL
ALT: 21 U/L (ref 0–53)
Alkaline Phosphatase: 60 U/L (ref 39–117)
Creatinine, Ser: 1.1 mg/dL (ref 0.4–1.5)
Sodium: 139 mEq/L (ref 135–145)
Total Bilirubin: 0.8 mg/dL (ref 0.3–1.2)
Total Protein: 7.3 g/dL (ref 6.0–8.3)

## 2011-04-10 LAB — CBC WITH DIFFERENTIAL/PLATELET
Basophils Relative: 0.4 % (ref 0.0–3.0)
Eosinophils Relative: 3.2 % (ref 0.0–5.0)
Lymphocytes Relative: 22.1 % (ref 12.0–46.0)
MCV: 90.3 fl (ref 78.0–100.0)
Monocytes Relative: 7.9 % (ref 3.0–12.0)
Neutrophils Relative %: 66.4 % (ref 43.0–77.0)
RBC: 4.75 Mil/uL (ref 4.22–5.81)
WBC: 7.4 10*3/uL (ref 4.5–10.5)

## 2011-04-10 LAB — LIPID PANEL
HDL: 40.6 mg/dL (ref >39.00)
Total CHOL/HDL Ratio: 5
Triglycerides: 236 mg/dL — ABNORMAL HIGH (ref 0.0–149.0)
VLDL: 47.2 mg/dL — ABNORMAL HIGH (ref 0.0–40.0)

## 2011-04-10 LAB — HEMOGLOBIN A1C: Hgb A1c MFr Bld: 5.9 % (ref 4.6–6.5)

## 2011-04-10 MED ORDER — SILDENAFIL CITRATE 100 MG PO TABS: 100.0000 mg | ORAL_TABLET | ORAL | Status: DC | PRN

## 2011-04-10 NOTE — Assessment & Plan Note (Signed)
Continue with current prescription therapy as reflected on the Med list.  

## 2011-04-10 NOTE — Assessment & Plan Note (Signed)
S/p ortho eval Better

## 2011-04-10 NOTE — Assessment & Plan Note (Signed)
Monitoring

## 2011-04-10 NOTE — Progress Notes (Signed)
  Subjective:    Patient ID: Richard Burgess, male    DOB: 1929-02-18, 77 y.o.   MRN: [id number]  HPI  The patient presents for a follow-up of  chronic hypertension, chronic dyslipidemia, ED controlled with medicines.  Wt Readings from Last 3 Encounters:  04/10/11 213 lb (96.616 kg)  03/03/11 212 lb (96.163 kg)  01/02/11 212 lb (96.163 kg)   BP Readings from Last 3 Encounters:  04/10/11 148/76  03/03/11 140/70  01/02/11 140/80       Review of Systems  Constitutional: Negative for appetite change, fatigue and unexpected weight change.  HENT: Negative for nosebleeds, congestion, sore throat, sneezing, trouble swallowing and neck pain.   Eyes: Negative for itching and visual disturbance.  Respiratory: Negative for cough.   Cardiovascular: Negative for chest pain, palpitations and leg swelling.  Gastrointestinal: Negative for nausea, diarrhea, blood in stool and abdominal distention.  Genitourinary: Negative for frequency and hematuria.  Musculoskeletal: Negative for back pain, joint swelling and gait problem.  Skin: Negative for rash.  Neurological: Negative for dizziness, tremors, speech difficulty and weakness.  Psychiatric/Behavioral: Negative for sleep disturbance, dysphoric mood and agitation. The patient is not nervous/anxious.        Objective:   Physical Exam  Constitutional: He is oriented to person, place, and time. He appears well-developed.       obese  HENT:  Mouth/Throat: Oropharynx is clear and moist.  Eyes: Conjunctivae are normal. Pupils are equal, round, and reactive to light.  Neck: Normal range of motion. No JVD present. No thyromegaly present.  Cardiovascular: Normal rate, regular rhythm, normal heart sounds and intact distal pulses.  Exam reveals no gallop and no friction rub.   No murmur heard. Pulmonary/Chest: Effort normal and breath sounds normal. No respiratory distress. He has no wheezes. He has no rales. He exhibits no tenderness.  Abdominal:  Soft. Bowel sounds are normal. He exhibits no distension and no mass. There is no tenderness. There is no rebound and no guarding.  Musculoskeletal: Normal range of motion. He exhibits no edema and no tenderness.  Lymphadenopathy:    He has no cervical adenopathy.  Neurological: He is alert and oriented to person, place, and time. He has normal reflexes. No cranial nerve deficit. He exhibits normal muscle tone. Coordination normal.  Skin: Skin is warm and dry. No rash noted.  Psychiatric: He has a normal mood and affect. His behavior is normal. Judgment and thought content normal.          Assessment & Plan:

## 2011-04-10 NOTE — Assessment & Plan Note (Signed)
Chronic. BP is better at home per pt Continue with current prescription therapy as reflected on the Med list.

## 2011-04-10 NOTE — Assessment & Plan Note (Signed)
Continue with current prescription therapy as reflected on the Med list. Chronic OA

## 2011-04-10 NOTE — Patient Instructions (Signed)
Wt Readings from Last 3 Encounters:  04/10/11 213 lb (96.616 kg)  03/03/11 212 lb (96.163 kg)  01/02/11 212 lb (96.163 kg)   BP Readings from Last 3 Encounters:  04/10/11 148/76  03/03/11 140/70  01/02/11 140/80

## 2011-04-11 ENCOUNTER — Telehealth: Payer: Self-pay | Admitting: Internal Medicine

## 2011-04-11 NOTE — Telephone Encounter (Signed)
Left detailed mess informing pt of below.  

## 2011-04-11 NOTE — Telephone Encounter (Signed)
[name] , please, inform the patient: labs are OK   Please, keep  next office visit appointment.   Thank you !   

## 2011-06-30 ENCOUNTER — Encounter: Payer: Self-pay | Admitting: Gastroenterology

## 2011-07-15 ENCOUNTER — Encounter: Payer: Self-pay | Admitting: Gastroenterology

## 2011-07-15 ENCOUNTER — Ambulatory Visit (INDEPENDENT_AMBULATORY_CARE_PROVIDER_SITE_OTHER): Payer: Medicare Other | Admitting: Gastroenterology

## 2011-07-15 ENCOUNTER — Ambulatory Visit: Payer: Medicare Other | Admitting: Gastroenterology

## 2011-07-15 VITALS — BP 130/70 | HR 88 | Ht 70.0 in | Wt 212.8 lb

## 2011-07-15 DIAGNOSIS — Z1211 Encounter for screening for malignant neoplasm of colon: Secondary | ICD-10-CM

## 2011-07-15 DIAGNOSIS — Z86010 Personal history of colonic polyps: Secondary | ICD-10-CM

## 2011-07-15 MED ORDER — PEG-KCL-NACL-NASULF-NA ASC-C 100 G PO SOLR: 1.0000 | Freq: Once | ORAL | Status: DC

## 2011-07-15 NOTE — Patient Instructions (Addendum)
You have been scheduled for a Colonoscopy with propofol. See separate instructions.  Pick up your prep kit from your pharmacy.

## 2011-07-15 NOTE — Progress Notes (Signed)
.  History of Present Illness: This is an 78 year old male who returns for followup with a history of adenocarcinoma in situ/HGD arising in a adenomatous colon polyp in 1993. Polyps were removed on followup colonoscopy in 1994, 1997, 2000, and 2004. His last colonoscopy was in 2009 showing small adenomatous colon polyps. He states he had a burning generalized abdominal pain for several days following his colonoscopy did eventually resolve. He denied such symptoms with his prior colonoscopies. Denies weight loss, abdominal pain, constipation, diarrhea, change in stool caliber, melena, hematochezia, nausea, vomiting, dysphagia, reflux symptoms, chest pain.  Review of Systems: Pertinent positive and negative review of systems were noted in the above HPI section. All other review of systems were otherwise negative.  Current Medications, Allergies, Past Medical History, Past Surgical History, Family History and Social History were reviewed in [employer] record.  Physical Exam: General: Well developed , well nourished, no acute distress Head: Normocephalic and atraumatic Eyes:  sclerae anicteric, EOMI Ears: Normal auditory acuity Mouth: No deformity or lesions Neck: Supple, no masses or thyromegaly Lungs: Clear throughout to auscultation Heart: Regular rate and rhythm; no murmurs, rubs or bruits Abdomen: Soft, non tender and non distended. No masses, hepatosplenomegaly or hernias noted. Normal Bowel sounds Rectal: Deferred to colonoscopy Musculoskeletal: Symmetrical with no gross deformities  Skin: No lesions on visible extremities Pulses:  Normal pulses noted Extremities: No clubbing, cyanosis, edema or deformities noted Neurological: Alert oriented x 4, grossly nonfocal Cervical Nodes:  No significant cervical adenopathy Inguinal Nodes: No significant inguinal adenopathy Psychological:  Alert and cooperative. Normal mood and affect  Assessment and Recommendations:  1.  Personal history of adenocarcinoma in situ/HGD arising in an adenomatous colon polyp in 1993.  Recurrent polyps on subsequent colonoscopies. The risks, benefits, and alternatives to colonoscopy with possible biopsy and possible polypectomy were discussed with the patient and they consent to proceed. Plan for propofol sedation.

## 2011-07-22 ENCOUNTER — Telehealth: Payer: Self-pay | Admitting: Gastroenterology

## 2011-07-22 NOTE — Telephone Encounter (Signed)
Patient states he cannot afford the prep and wants an alternative. Told patient that we do not have a alternative prep but I can send a free unit of Movi prep voucher to his pharmacy. Pt agreed and voucher called into [place] [facility] and faxed to the pharmacy.

## 2011-08-13 ENCOUNTER — Ambulatory Visit (INDEPENDENT_AMBULATORY_CARE_PROVIDER_SITE_OTHER): Payer: Medicare Other | Admitting: Internal Medicine

## 2011-08-13 ENCOUNTER — Encounter: Payer: Self-pay | Admitting: Internal Medicine

## 2011-08-13 ENCOUNTER — Ambulatory Visit (INDEPENDENT_AMBULATORY_CARE_PROVIDER_SITE_OTHER)
Admission: RE | Admit: 2011-08-13 | Discharge: 2011-08-13 | Disposition: A | Discharge status: Home / Self Care | Payer: Medicare Other | Source: Ambulatory Visit | Attending: Internal Medicine | Admitting: Internal Medicine

## 2011-08-13 VITALS — BP 138/62 | HR 78 | Temp 98.2°F | Wt 214.0 lb

## 2011-08-13 DIAGNOSIS — M545 Low back pain, unspecified: Secondary | ICD-10-CM

## 2011-08-13 DIAGNOSIS — M5416 Radiculopathy, lumbar region: Secondary | ICD-10-CM

## 2011-08-13 DIAGNOSIS — IMO0002 Reserved for concepts with insufficient information to code with codable children: Secondary | ICD-10-CM

## 2011-08-13 MED ORDER — METHOCARBAMOL 500 MG PO TABS: 500.0000 mg | ORAL_TABLET | Freq: Every evening | ORAL | Status: AC | PRN

## 2011-08-13 MED ORDER — TRAMADOL HCL 50 MG PO TABS: 50.0000 mg | ORAL_TABLET | Freq: Three times a day (TID) | ORAL | Status: DC | PRN

## 2011-08-13 NOTE — Patient Instructions (Signed)
It was good to see you today. Test(s) ordered today. Your results will be called to you after review (48-72hours after test completion). If any changes need to be made, you will be notified at that time. we'll make referral for MRI low back . Our office will contact you regarding appointment(s) once made. You will then be called for results after review, and refer to back specialist if needed Use Tylenol as needed for pain first - over-the-counter If pain unrelieved with Tylenol, use tramadol every 8 hours as needed and Robaxin at bedtime for muscle spasm as needed - Your prescription(s) have been submitted to your pharmacy. Please take as directed and contact our office if you believe you are having problem(s) with the medication(s).
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
## 2011-08-14 ENCOUNTER — Telehealth: Payer: Self-pay | Admitting: Gastroenterology

## 2011-08-14 NOTE — Telephone Encounter (Signed)
Pt's wife said she spilled 1/2 of tonight's prep. Confirmed that pt has been npo except for liquids all day today, then gave pt option to either come pick up a voucher for free prep and go get entire new prep which she did not want to do, or she could have him drink the full prep for tonight and drink the 1/2 of prep left for the am prep. Pt  agreed with this and knows pt's stool should become clear or yellow tinted liquid .

## 2011-08-15 ENCOUNTER — Encounter: Payer: Self-pay | Admitting: Gastroenterology

## 2011-08-15 ENCOUNTER — Ambulatory Visit (AMBULATORY_SURGERY_CENTER): Payer: Medicare Other | Admitting: Gastroenterology

## 2011-08-15 DIAGNOSIS — Z860100 Personal history of colon polyps, unspecified: Secondary | ICD-10-CM

## 2011-08-15 DIAGNOSIS — Z1211 Encounter for screening for malignant neoplasm of colon: Secondary | ICD-10-CM

## 2011-08-15 MED ORDER — SODIUM CHLORIDE 0.9 % IV SOLN: 500.0000 mL | INTRAVENOUS | Status: DC

## 2011-08-15 NOTE — Op Note (Signed)
[facility] [address], [place]  [address]  COLONOSCOPY PROCEDURE REPORT  PATIENT:  Richard Burgess, Richard Burgess  MR#:  [id number] BIRTHDATE:  24-[name]-1929, 82 yrs. old  GENDER:  male ENDOSCOPIST:  Judie Petit T. Russella Dar, MD, [facility]  PROCEDURE DATE:  08/15/2011 PROCEDURE:  Colonoscopy 19147 ASA CLASS:  Class III INDICATIONS:  1) surveillance and high-risk screening  2) history of colon cancer: ca in situ in a polyp  3) history of multiple recurrent pre-cancerous (adenomatous) colon polyps MEDICATIONS:   MAC sedation, administered by CRNA, propofol (Diprivan) 130 mg IV DESCRIPTION OF PROCEDURE:   After the risks benefits and alternatives of the procedure were thoroughly explained, informed consent was obtained.  Digital rectal exam was performed and revealed no abnormalities.   The LB 180AL [id number] endoscope was introduced through the anus and advanced to the cecum, which was identified by both the appendix and ileocecal valve, without limitations.  The quality of the prep was excellent, using MoviPrep.  The instrument was then slowly withdrawn as the colon was fully examined. <<PROCEDUREIMAGES>> FINDINGS:  Mild diverticulosis was found in the sigmoid colon. Otherwise normal colonoscopy without other polyps, masses, vascular ectasias, or inflammatory changes.   Retroflexed views in the rectum revealed internal hemorrhoids, small.  The time to cecum = 3.25  minutes. The scope was then withdrawn (time =  8.5  min) from the patient and the procedure completed.  COMPLICATIONS:  None  ENDOSCOPIC IMPRESSION: 1) Mild diverticulosis in the sigmoid colon 2) Internal hemorrhoids  RECOMMENDATIONS: 1) High fiber diet with liberal fluid intake. 2) Given your age, you will not need another colonoscopy for colon cancer screening or polyp surveillance. These types of tests usually stop around the age 27.  Venita Lick. Russella Dar, MD, [facility]  n. eSIGNED:   Venita Lick. Randeep Biondolillo at 08/15/2011 08:53  AM  Ronnald Nian, [phone]

## 2011-08-15 NOTE — Patient Instructions (Signed)
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
## 2011-08-18 ENCOUNTER — Ambulatory Visit
Admission: RE | Admit: 2011-08-18 | Discharge: 2011-08-18 | Disposition: A | Discharge status: Home / Self Care | Payer: Medicare Other | Source: Ambulatory Visit | Attending: Internal Medicine | Admitting: Internal Medicine

## 2011-08-18 ENCOUNTER — Other Ambulatory Visit: Payer: Self-pay | Admitting: *Deleted

## 2011-08-18 ENCOUNTER — Telehealth: Payer: Self-pay | Admitting: *Deleted

## 2011-08-18 DIAGNOSIS — M545 Low back pain, unspecified: Secondary | ICD-10-CM

## 2011-08-18 DIAGNOSIS — M5416 Radiculopathy, lumbar region: Secondary | ICD-10-CM

## 2011-08-18 NOTE — Telephone Encounter (Signed)
  Follow up Call-  Call back number 08/15/2011  Post procedure Call Back phone  # [phone]  Permission to leave phone message Yes     Patient questions:  Do you have a fever, pain , or abdominal swelling? no Pain Score  0 *  Have you tolerated food without any problems? yes  Have you been able to return to your normal activities? yes  Do you have any questions about your discharge instructions: Diet   no Medications  no Follow up visit  no  Do you have questions or concerns about your Care? no  Actions: * If pain score is 4 or above: No action needed, pain <4.

## 2011-08-19 ENCOUNTER — Ambulatory Visit
Admission: RE | Admit: 2011-08-19 | Discharge: 2011-08-19 | Disposition: A | Discharge status: Home / Self Care | Payer: Medicare Other | Source: Ambulatory Visit | Attending: Internal Medicine | Admitting: Internal Medicine

## 2011-08-20 ENCOUNTER — Telehealth: Payer: Self-pay | Admitting: *Deleted

## 2011-08-20 DIAGNOSIS — M545 Low back pain, unspecified: Secondary | ICD-10-CM

## 2011-08-20 DIAGNOSIS — M48061 Spinal stenosis, lumbar region without neurogenic claudication: Secondary | ICD-10-CM
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
## 2011-08-20 NOTE — Telephone Encounter (Signed)
Notified pt with md response. Pt is requesting to see back specialist. Did inform pt once appt has been set up will received call from [facility] with appt, date, & time... 08/20/11@3 :42pm/LMB

## 2011-08-25 ENCOUNTER — Telehealth: Payer: Self-pay | Admitting: *Deleted

## 2011-08-25 NOTE — Telephone Encounter (Signed)
pts wife left vm requesting new Rx for Pantoprazole to be sent to [facility] in place of omeprazole. Please advise.

## 2011-08-26 MED ORDER — PANTOPRAZOLE SODIUM 40 MG PO TBEC: 40.0000 mg | DELAYED_RELEASE_TABLET | Freq: Every day | ORAL | Status: DC

## 2011-08-26 NOTE — Telephone Encounter (Signed)
Ok 40 mg #90 3 ref Thx

## 2011-08-26 NOTE — Telephone Encounter (Signed)
Done

## 2011-09-10 ENCOUNTER — Telehealth: Payer: Self-pay | Admitting: *Deleted

## 2011-09-10 NOTE — Telephone Encounter (Signed)
Dr. Retia Passe from [facility] called regarding pt. Pt stated that he is taking Coumadin that was prescribed by PCP but there is no record of Coumadin rx in pt's chart. Also called [facility] and they have no record of Coumadin rx for pt-has pt ever taking Coumadin?

## 2011-09-10 NOTE — Telephone Encounter (Signed)
He is on Com for PVD (carotids) and TIAs Thx

## 2011-09-10 NOTE — Telephone Encounter (Signed)
Joy from [facility] informed (Dr. Retia Passe not in office), they will callback if more information is needed.

## 2011-09-24 ENCOUNTER — Other Ambulatory Visit: Payer: Self-pay | Admitting: Internal Medicine

## 2011-09-24 ENCOUNTER — Encounter: Payer: Self-pay | Admitting: Internal Medicine

## 2011-09-24 ENCOUNTER — Ambulatory Visit (INDEPENDENT_AMBULATORY_CARE_PROVIDER_SITE_OTHER): Payer: Medicare Other | Admitting: Internal Medicine

## 2011-09-24 VITALS — BP 140/80 | HR 76 | Temp 97.6°F | Resp 16 | Wt 214.0 lb

## 2011-09-24 DIAGNOSIS — D485 Neoplasm of uncertain behavior of skin: Secondary | ICD-10-CM

## 2011-09-24 DIAGNOSIS — L570 Actinic keratosis: Secondary | ICD-10-CM

## 2011-09-24 DIAGNOSIS — D489 Neoplasm of uncertain behavior, unspecified: Secondary | ICD-10-CM

## 2011-09-24 NOTE — Assessment & Plan Note (Signed)
See cryo 

## 2011-09-24 NOTE — Progress Notes (Signed)
  Subjective:    Patient ID: Richard Burgess, male    DOB: 12-03-1928, 78 y.o.   MRN: [id number]  HPI  C/o lesion under L eye C/o lesions on B forearms, hands and ears  Review of Systems     Objective:   Physical Exam  Constitutional: No distress.  Skin: Rash noted.       11x8 mm scabbed lesion under L eye AKs on B forearms, hands, ears     Procedure Note :     Procedure :  Skin biopsy   Indication:    Suspicious lesion(s)   Risks including unsuccessful procedure , bleeding, infection, bruising, scar, a need for another complete procedure and others were explained to the patient in detail as well as the benefits. Informed consent was obtained and signed.   The patient was placed in a decubitus position.  Lesion #1 on   L cheek  measuring  11x8   mm   Skin over lesion #1  was prepped with Betadine and alcohol  and anesthetized with 1 cc of 2% lidocaine and epinephrine, using a 25-gauge 1 inch needle.  Shave biopsy with a sterile Dermablade was carried out in the usual fashion. Hyfrecator was used to destroy the rest of the lesion potentially left behind and for hemostasis. Band-Aid was applied with antibiotic ointment    Postprocedure instructions :    A Band-Aid should be  changed twice daily. You can take a shower tomorrow.  Keep the wounds clean. You can wash them with liquid soap and water. Pat dry with gauze or a Kleenex tissue  Before applying antibiotic ointment and a Band-Aid.   You need to report immediately  if fever, chills or any signs of infection develop.    The biopsy results should be available in 1 -2 weeks.   Procedure Note :     Procedure : Cryosurgery   Indication:    Actinic keratosis(es)   Risks including unsuccessful procedure , bleeding, infection, bruising, scar, a need for a repeat  procedure and others were explained to the patient in detail as well as the benefits. Informed consent was obtained verbally.     lesion(s)  on    was/were treated  with liquid nitrogen on a Q-tip in a usual fasion . Band-Aid was applied and antibiotic ointment was given for a later use.   Tolerated well. Complications none.   Postprocedure instructions :     Keep the wounds clean. You can wash them with liquid soap and water. Pat dry with gauze or a Kleenex tissue  Before applying antibiotic ointment and a Band-Aid.   You need to report immediately  if  any signs of infection develop.         Assessment & Plan:

## 2011-09-24 NOTE — Assessment & Plan Note (Signed)
Skin bx 

## 2011-09-26 ENCOUNTER — Ambulatory Visit: Payer: Medicare Other | Admitting: Internal Medicine

## 2011-09-30 ENCOUNTER — Telehealth: Payer: Self-pay | Admitting: Internal Medicine

## 2011-09-30 NOTE — Telephone Encounter (Signed)
Richard Burgess, please, inform patient that his bx was ok - we will see how well it heals Thx

## 2011-10-01 NOTE — Telephone Encounter (Signed)
Pts wife informed.

## 2011-10-09 ENCOUNTER — Ambulatory Visit: Payer: Self-pay | Admitting: Internal Medicine

## 2011-10-20 ENCOUNTER — Encounter: Payer: Self-pay | Admitting: Internal Medicine

## 2011-10-20 ENCOUNTER — Ambulatory Visit (INDEPENDENT_AMBULATORY_CARE_PROVIDER_SITE_OTHER): Payer: Medicare Other | Admitting: Internal Medicine

## 2011-10-20 ENCOUNTER — Other Ambulatory Visit (INDEPENDENT_AMBULATORY_CARE_PROVIDER_SITE_OTHER): Payer: Medicare Other

## 2011-10-20 VITALS — BP 170/80 | HR 78 | Temp 97.1°F | Wt 214.0 lb

## 2011-10-20 DIAGNOSIS — K5732 Diverticulitis of large intestine without perforation or abscess without bleeding: Secondary | ICD-10-CM

## 2011-10-20 DIAGNOSIS — K5792 Diverticulitis of intestine, part unspecified, without perforation or abscess without bleeding: Secondary | ICD-10-CM

## 2011-10-20 DIAGNOSIS — R109 Unspecified abdominal pain: Secondary | ICD-10-CM

## 2011-10-20 LAB — URINALYSIS
Specific Gravity, Urine: 1.015 (ref 1.000–1.030)
Total Protein, Urine: NEGATIVE
Urine Glucose: NEGATIVE

## 2011-10-20 LAB — CBC WITH DIFFERENTIAL/PLATELET
Basophils Relative: 0.3 % (ref 0.0–3.0)
Eosinophils Relative: 2.7 % (ref 0.0–5.0)
HCT: 42 % (ref 39.0–52.0)
Hemoglobin: 14.2 g/dL (ref 13.0–17.0)
Lymphs Abs: 1.8 10*3/uL (ref 0.7–4.0)
MCV: 89.2 fl (ref 78.0–100.0)
Monocytes Absolute: 0.7 10*3/uL (ref 0.1–1.0)
Neutrophils Relative %: 64.5 % (ref 43.0–77.0)
RBC: 4.71 Mil/uL (ref 4.22–5.81)
WBC: 7.7 10*3/uL (ref 4.5–10.5)

## 2011-10-20 LAB — BASIC METABOLIC PANEL
CO2: 23 mEq/L (ref 19–32)
Chloride: 99 mEq/L (ref 96–112)
Sodium: 137 mEq/L (ref 135–145)

## 2011-10-20 MED ORDER — CIPROFLOXACIN HCL 500 MG PO TABS: 500.0000 mg | ORAL_TABLET | Freq: Two times a day (BID) | ORAL | Status: AC

## 2011-10-20 NOTE — Patient Instructions (Signed)
To ER if worse Low residue diet x 10 days

## 2011-10-20 NOTE — Assessment & Plan Note (Signed)
L 5/13 Low residue diet Labs Cipro

## 2011-10-20 NOTE — Assessment & Plan Note (Signed)
CT if not better - likely diverticulitis R/o UTI Start Cipro

## 2011-10-20 NOTE — Progress Notes (Signed)
Patient ID: COOLIDGE GOSSARD, male   DOB: [birth year], 78 y.o.   MRN: [id number]  Subjective:    Patient ID: Nada Boozer, male    DOB: [birth year], 78 y.o.   MRN: [id number]  Abdominal Pain Pertinent negatives include no diarrhea, dysuria, fever, frequency, hematuria, nausea or vomiting.  C/o L to mid-abd pain x 2 wks  7-8/10 x 5 min-3 h off and on; mild pain now  The patient presents for a follow-up of  chronic hypertension, chronic dyslipidemia, ED controlled with medicines.  Wt Readings from Last 3 Encounters:  10/20/11 214 lb (97.07 kg)  09/24/11 214 lb (97.07 kg)  08/15/11 212 lb (96.163 kg)   BP Readings from Last 3 Encounters:  10/20/11 170/80  09/24/11 140/80  08/15/11 123/75       Review of Systems  Constitutional: Negative for fever, appetite change, fatigue and unexpected weight change.  HENT: Negative for nosebleeds, congestion, sore throat, sneezing, trouble swallowing and neck pain.   Eyes: Negative for itching and visual disturbance.  Respiratory: Negative for cough and chest tightness.   Cardiovascular: Negative for chest pain, palpitations and leg swelling.  Gastrointestinal: Positive for abdominal pain. Negative for nausea, vomiting, diarrhea, blood in stool, abdominal distention and rectal pain.  Genitourinary: Negative for dysuria, urgency, frequency, hematuria, flank pain and decreased urine volume.  Musculoskeletal: Negative for back pain, joint swelling and gait problem.  Skin: Negative for rash.  Neurological: Negative for dizziness, tremors, speech difficulty, weakness and numbness.  Psychiatric/Behavioral: Negative for sleep disturbance, dysphoric mood and agitation. The patient is not nervous/anxious.        Objective:   Physical Exam  Constitutional: He is oriented to person, place, and time. He appears well-developed.       obese  HENT:  Mouth/Throat: Oropharynx is clear and moist.  Eyes: Conjunctivae are normal. Pupils are equal, round, and  reactive to light.  Neck: Normal range of motion. No JVD present. No thyromegaly present.  Cardiovascular: Normal rate, regular rhythm, normal heart sounds and intact distal pulses.  Exam reveals no gallop and no friction rub.   No murmur heard. Pulmonary/Chest: Effort normal and breath sounds normal. No respiratory distress. He has no wheezes. He has no rales. He exhibits no tenderness.  Abdominal: Soft. Bowel sounds are normal. He exhibits no distension and no mass. There is tenderness (very mild pain on the L). There is no rebound and no guarding.  Musculoskeletal: Normal range of motion. He exhibits no edema and no tenderness.  Lymphadenopathy:    He has no cervical adenopathy.  Neurological: He is alert and oriented to person, place, and time. He has normal reflexes. No cranial nerve deficit. He exhibits normal muscle tone. Coordination normal.  Skin: Skin is warm and dry. No rash noted.  Psychiatric: He has a normal mood and affect. His behavior is normal. Judgment and thought content normal.          Assessment & Plan:

## 2011-11-10 ENCOUNTER — Encounter: Payer: Self-pay | Admitting: Internal Medicine

## 2011-11-10 ENCOUNTER — Ambulatory Visit (INDEPENDENT_AMBULATORY_CARE_PROVIDER_SITE_OTHER): Payer: Medicare Other | Admitting: Internal Medicine

## 2011-11-10 VITALS — BP 138/78 | HR 80 | Temp 98.1°F | Resp 16 | Wt 215.0 lb

## 2011-11-10 DIAGNOSIS — K5792 Diverticulitis of intestine, part unspecified, without perforation or abscess without bleeding: Secondary | ICD-10-CM

## 2011-11-10 DIAGNOSIS — I10 Essential (primary) hypertension: Secondary | ICD-10-CM

## 2011-11-10 DIAGNOSIS — K5732 Diverticulitis of large intestine without perforation or abscess without bleeding: Secondary | ICD-10-CM

## 2011-11-10 DIAGNOSIS — R109 Unspecified abdominal pain: Secondary | ICD-10-CM

## 2011-11-10 NOTE — Assessment & Plan Note (Signed)
Resolved  CT 2007 Colon 3/13 Dr Russella Dar

## 2011-11-10 NOTE — Assessment & Plan Note (Signed)
Resolved

## 2011-11-10 NOTE — Assessment & Plan Note (Signed)
Continue with current prescription therapy as reflected on the Med list.  

## 2011-11-10 NOTE — Progress Notes (Signed)
Patient ID: Richard Burgess, male   DOB: Feb 05, 1929, 78 y.o.   MRN: [id number] Patient ID: Richard Burgess, male   DOB: 12-06-1928, 78 y.o.   MRN: [id number]  Subjective:    Patient ID: Richard Burgess, male    DOB: 1928/09/10, 78 y.o.   MRN: [id number]  HPI F/u  L to mid-abd pain x 2 wks   - resolved on abx  The patient presents for a follow-up of  chronic hypertension, chronic dyslipidemia, ED controlled with medicines.  Wt Readings from Last 3 Encounters:  11/10/11 215 lb (97.523 kg)  10/20/11 214 lb (97.07 kg)  09/24/11 214 lb (97.07 kg)   BP Readings from Last 3 Encounters:  11/10/11 138/78  10/20/11 170/80  09/24/11 140/80       Review of Systems  Constitutional: Negative for appetite change, fatigue and unexpected weight change.  HENT: Negative for nosebleeds, congestion, sore throat, sneezing, trouble swallowing and neck pain.   Eyes: Negative for itching and visual disturbance.  Respiratory: Negative for cough and chest tightness.   Cardiovascular: Negative for chest pain, palpitations and leg swelling.  Gastrointestinal: Negative for blood in stool, abdominal distention and rectal pain.  Genitourinary: Negative for urgency, flank pain and decreased urine volume.  Musculoskeletal: Negative for back pain, joint swelling and gait problem.  Skin: Negative for rash.  Neurological: Negative for dizziness, tremors, speech difficulty, weakness and numbness.  Psychiatric/Behavioral: Negative for sleep disturbance, dysphoric mood and agitation. The patient is not nervous/anxious.        Objective:   Physical Exam  Constitutional: He is oriented to person, place, and time. He appears well-developed.       obese  HENT:  Mouth/Throat: Oropharynx is clear and moist.  Eyes: Conjunctivae are normal. Pupils are equal, round, and reactive to light.  Neck: Normal range of motion. No JVD present. No thyromegaly present.  Cardiovascular: Normal rate, regular rhythm, normal heart sounds  and intact distal pulses.  Exam reveals no gallop and no friction rub.   No murmur heard. Pulmonary/Chest: Effort normal and breath sounds normal. No respiratory distress. He has no wheezes. He has no rales. He exhibits no tenderness.  Abdominal: Soft. Bowel sounds are normal. He exhibits no distension and no mass. There is tenderness (very mild pain on the L). There is no rebound and no guarding.  Musculoskeletal: Normal range of motion. He exhibits no edema and no tenderness.  Lymphadenopathy:    He has no cervical adenopathy.  Neurological: He is alert and oriented to person, place, and time. He has normal reflexes. No cranial nerve deficit. He exhibits normal muscle tone. Coordination normal.  Skin: Skin is warm and dry. No rash noted.  Psychiatric: He has a normal mood and affect. His behavior is normal. Judgment and thought content normal.    Lab Results  Component Value Date   WBC 7.7 10/20/2011   HGB 14.2 10/20/2011   HCT 42.0 10/20/2011   PLT 179.0 10/20/2011   GLUCOSE 147* 10/20/2011   CHOL 215* 04/10/2011   TRIG 236.0* 04/10/2011   HDL 40.60 04/10/2011   LDLDIRECT 159.8 04/10/2011   LDLCALC 91 04/22/2010   ALT 21 04/10/2011   AST 20 04/10/2011   NA 137 10/20/2011   K 4.7 10/20/2011   CL 99 10/20/2011   CREATININE 1.1 10/20/2011   BUN 17 10/20/2011   CO2 23 10/20/2011   TSH 1.92 08/13/2010   PSA 0.22 08/13/2010   HGBA1C 5.9 04/10/2011   MICROALBUR 0.2 12/13/2007  Assessment & Plan:

## 2011-11-13 ENCOUNTER — Telehealth: Payer: Self-pay | Admitting: Internal Medicine

## 2011-11-13 ENCOUNTER — Encounter: Payer: Self-pay | Admitting: Internal Medicine

## 2011-11-13 ENCOUNTER — Ambulatory Visit (INDEPENDENT_AMBULATORY_CARE_PROVIDER_SITE_OTHER): Payer: Medicare Other | Admitting: Internal Medicine

## 2011-11-13 VITALS — BP 140/82 | HR 80 | Temp 98.4°F | Resp 16 | Wt 213.0 lb

## 2011-11-13 DIAGNOSIS — I10 Essential (primary) hypertension: Secondary | ICD-10-CM

## 2011-11-13 DIAGNOSIS — T63461A Toxic effect of venom of wasps, accidental (unintentional), initial encounter: Secondary | ICD-10-CM

## 2011-11-13 DIAGNOSIS — E785 Hyperlipidemia, unspecified: Secondary | ICD-10-CM

## 2011-11-13 DIAGNOSIS — B356 Tinea cruris: Secondary | ICD-10-CM

## 2011-11-13 DIAGNOSIS — T63441A Toxic effect of venom of bees, accidental (unintentional), initial encounter: Secondary | ICD-10-CM | POA: Insufficient documentation

## 2011-11-13 DIAGNOSIS — T6391XA Toxic effect of contact with unspecified venomous animal, accidental (unintentional), initial encounter: Secondary | ICD-10-CM

## 2011-11-13 MED ORDER — METHYLPREDNISOLONE ACETATE 80 MG/ML IJ SUSP
120.0000 mg | Freq: Once | INTRAMUSCULAR | Status: AC
  Administered 2011-11-13: 120 mg via INTRAMUSCULAR

## 2011-11-13 MED ORDER — CLOTRIMAZOLE-BETAMETHASONE 1-0.05 % EX CREA: TOPICAL_CREAM | Freq: Two times a day (BID) | CUTANEOUS | Status: DC

## 2011-11-13 MED ORDER — DOXYCYCLINE HYCLATE 100 MG PO TABS: 100.0000 mg | ORAL_TABLET | Freq: Two times a day (BID) | ORAL | Status: AC

## 2011-11-13 NOTE — Progress Notes (Signed)
Subjective:    Patient ID: Richard Burgess, male    DOB: August 06, 1928, 77 y.o.   MRN: [id number]  HPI  C/o several bee stings in the neck and legs - he was very swollen yesterday C/o rash over L knee  The patient presents for a follow-up of  chronic hypertension, chronic dyslipidemia, ED controlled with medicines.  Wt Readings from Last 3 Encounters:  11/13/11 213 lb (96.616 kg)  11/10/11 215 lb (97.523 kg)  10/20/11 214 lb (97.07 kg)   BP Readings from Last 3 Encounters:  11/13/11 140/82  11/10/11 138/78  10/20/11 170/80       Review of Systems  Constitutional: Negative for appetite change, fatigue and unexpected weight change.  HENT: Negative for nosebleeds, congestion, sore throat, sneezing, trouble swallowing and neck pain.   Eyes: Negative for itching and visual disturbance.  Respiratory: Negative for cough and chest tightness.   Cardiovascular: Negative for chest pain, palpitations and leg swelling.  Gastrointestinal: Negative for blood in stool, abdominal distention and rectal pain.  Genitourinary: Negative for urgency, flank pain and decreased urine volume.  Musculoskeletal: Negative for back pain, joint swelling and gait problem.  Skin: Positive for rash.  Neurological: Negative for dizziness, tremors, speech difficulty, weakness and numbness.  Psychiatric/Behavioral: Negative for sleep disturbance, dysphoric mood and agitation. The patient is not nervous/anxious.        Objective:   Physical Exam  Constitutional: He is oriented to person, place, and time. He appears well-developed.       obese  HENT:  Mouth/Throat: Oropharynx is clear and moist.  Eyes: Conjunctivae are normal. Pupils are equal, round, and reactive to light.  Neck: Normal range of motion. No JVD present. No thyromegaly present.  Cardiovascular: Normal rate, regular rhythm, normal heart sounds and intact distal pulses.  Exam reveals no gallop and no friction rub.   No murmur  heard. Pulmonary/Chest: Effort normal and breath sounds normal. No respiratory distress. He has no wheezes. He has no rales. He exhibits no tenderness.  Abdominal: Soft. Bowel sounds are normal. He exhibits no distension and no mass. There is tenderness (very mild pain on the L). There is no rebound and no guarding.  Musculoskeletal: Normal range of motion. He exhibits no edema and no tenderness.  Lymphadenopathy:    He has no cervical adenopathy.  Neurological: He is alert and oriented to person, place, and time. He has normal reflexes. No cranial nerve deficit. He exhibits normal muscle tone. Coordination normal.  Skin: Skin is warm and dry. No rash noted.       Ringworm rash over R knee Bee stings w/erythema - neck, L arm  Psychiatric: He has a normal mood and affect. His behavior is normal. Judgment and thought content normal.    Lab Results  Component Value Date   WBC 7.7 10/20/2011   HGB 14.2 10/20/2011   HCT 42.0 10/20/2011   PLT 179.0 10/20/2011   GLUCOSE 147* 10/20/2011   CHOL 215* 04/10/2011   TRIG 236.0* 04/10/2011   HDL 40.60 04/10/2011   LDLDIRECT 159.8 04/10/2011   LDLCALC 91 04/22/2010   ALT 21 04/10/2011   AST 20 04/10/2011   NA 137 10/20/2011   K 4.7 10/20/2011   CL 99 10/20/2011   CREATININE 1.1 10/20/2011   BUN 17 10/20/2011   CO2 23 10/20/2011   TSH 1.92 08/13/2010   PSA 0.22 08/13/2010   HGBA1C 5.9 04/10/2011   MICROALBUR 0.2 12/13/2007  Assessment & Plan:

## 2011-11-13 NOTE — Assessment & Plan Note (Signed)
Depomedrol 120 mg im Doxy

## 2011-11-13 NOTE — Telephone Encounter (Signed)
Ok 1 pm Thx 

## 2011-11-13 NOTE — Assessment & Plan Note (Signed)
Lotrisone bid  

## 2011-11-13 NOTE — Telephone Encounter (Signed)
Dr Posey Rea   Per wife pt stung by bee yesterday and swollen---desire today appt--you and no one else have availability--do you want pt worked in with you   -thanks for your reply

## 2011-11-17 ENCOUNTER — Other Ambulatory Visit: Payer: Self-pay | Admitting: *Deleted

## 2011-11-17 MED ORDER — LISINOPRIL 20 MG PO TABS: 20.0000 mg | ORAL_TABLET | Freq: Every day | ORAL | Status: DC

## 2011-11-17 MED ORDER — AMLODIPINE BESYLATE 5 MG PO TABS: 5.0000 mg | ORAL_TABLET | Freq: Every day | ORAL | Status: DC

## 2011-12-05 ENCOUNTER — Ambulatory Visit (INDEPENDENT_AMBULATORY_CARE_PROVIDER_SITE_OTHER): Payer: Medicare Other | Admitting: Internal Medicine

## 2011-12-05 ENCOUNTER — Other Ambulatory Visit (INDEPENDENT_AMBULATORY_CARE_PROVIDER_SITE_OTHER): Payer: Medicare Other

## 2011-12-05 ENCOUNTER — Encounter: Payer: Self-pay | Admitting: Internal Medicine

## 2011-12-05 VITALS — BP 128/80 | HR 66 | Temp 98.4°F | Resp 16 | Ht 70.0 in | Wt 205.0 lb

## 2011-12-05 DIAGNOSIS — R61 Generalized hyperhidrosis: Secondary | ICD-10-CM

## 2011-12-05 DIAGNOSIS — G4700 Insomnia, unspecified: Secondary | ICD-10-CM

## 2011-12-05 DIAGNOSIS — I739 Peripheral vascular disease, unspecified: Secondary | ICD-10-CM

## 2011-12-05 LAB — HEPATIC FUNCTION PANEL
AST: 21 U/L (ref 0–37)
Alkaline Phosphatase: 63 U/L (ref 39–117)
Bilirubin, Direct: 0.2 mg/dL (ref 0.0–0.3)
Total Protein: 7.6 g/dL (ref 6.0–8.3)

## 2011-12-05 LAB — CBC WITH DIFFERENTIAL/PLATELET
Basophils Absolute: 0 10*3/uL (ref 0.0–0.1)
Basophils Relative: 0.4 % (ref 0.0–3.0)
Eosinophils Relative: 1.5 % (ref 0.0–5.0)
HCT: 42.9 % (ref 39.0–52.0)
Hemoglobin: 14.4 g/dL (ref 13.0–17.0)
Lymphocytes Relative: 18.9 % (ref 12.0–46.0)
Monocytes Relative: 7.1 % (ref 3.0–12.0)
Neutro Abs: 7.4 10*3/uL (ref 1.4–7.7)
RBC: 4.82 Mil/uL (ref 4.22–5.81)
RDW: 14.1 % (ref 11.5–14.6)
WBC: 10.3 10*3/uL (ref 4.5–10.5)

## 2011-12-05 LAB — BASIC METABOLIC PANEL
Calcium: 9.9 mg/dL (ref 8.4–10.5)
GFR: 59.47 mL/min — ABNORMAL LOW (ref >60.00)
Potassium: 4.8 mEq/L (ref 3.5–5.1)
Sodium: 139 mEq/L (ref 135–145)

## 2011-12-05 MED ORDER — ZOLPIDEM TARTRATE 5 MG PO TABS: 5.0000 mg | ORAL_TABLET | Freq: Every evening | ORAL | Status: DC | PRN

## 2011-12-05 NOTE — Progress Notes (Signed)
  Subjective:    Patient ID: Richard Burgess, male    DOB: Apr 16, 1929, 78 y.o.   MRN: [id number]  HPI  complains of poor sleep and "sweating" each night for 2 weeks Denies prior hx same Denies stressors or med changes in past weeks  ?time for follow up carotid - done 7/12 and check annually  Past Medical History  Diagnosis Date  . Diverticulosis of colon   . Hypertention, malignant, with acute intensive management   . PVD (peripheral vascular disease)     Bilateral carotid  . Hx-TIA (transient ischemic attack)   . ED (erectile dysfunction)   . Glucose intolerance (impaired glucose tolerance)   . Hyperlipidemia   . GERD (gastroesophageal reflux disease)   . Allergy   . Osteoarthritis   . Low back pain   . History of pancreatitis   . BPH (benign prostatic hypertrophy)   . Tubular adenoma of colon 08/1991    One with carcinoma IN [facility] 1993, multiple adenomatous  . Hypertension       Review of Systems  Constitutional: Positive for fatigue. Negative for fever, activity change and appetite change.  Respiratory: Negative for cough and shortness of breath.   Cardiovascular: Negative for chest pain and leg swelling.  Neurological: Negative for dizziness, facial asymmetry, light-headedness and headaches.       Objective:   Physical Exam BP 128/80  Pulse 66  Temp 98.4 F (36.9 C) (Oral)  Resp 16  Ht 5\' 10"  (1.778 m)  Wt 205 lb (92.987 kg)  BMI 29.41 kg/m2 Wt Readings from Last 3 Encounters:  12/05/11 205 lb (92.987 kg)  11/13/11 213 lb (96.616 kg)  11/10/11 215 lb (97.523 kg)   Constitutional:  He appears well-developed and well-nourished. No distress. Wife at side Neck: Normal range of motion. Neck supple. No JVD or carotid bruit. No thyromegaly present.  Cardiovascular: Normal rate, regular rhythm and normal heart sounds.  No murmur heard. no BLE edema Pulmonary/Chest: Effort normal and breath sounds normal. No respiratory distress. no wheezes. Neurological: he is  alert and oriented to person, place, and time. No cranial nerve deficit. Coordination normal.  Skin: tanned and sunburned face/distal ext.   Psychiatric: he has a normal mood and affect. behavior is normal. Judgment and thought content normal.   Lab Results  Component Value Date   WBC 7.7 10/20/2011   HGB 14.2 10/20/2011   HCT 42.0 10/20/2011   PLT 179.0 10/20/2011   GLUCOSE 147* 10/20/2011   CHOL 215* 04/10/2011   TRIG 236.0* 04/10/2011   HDL 40.60 04/10/2011   LDLDIRECT 159.8 04/10/2011   LDLCALC 91 04/22/2010   ALT 21 04/10/2011   AST 20 04/10/2011   NA 137 10/20/2011   K 4.7 10/20/2011   CL 99 10/20/2011   CREATININE 1.1 10/20/2011   BUN 17 10/20/2011   CO2 23 10/20/2011   TSH 1.92 08/13/2010   PSA 0.22 08/13/2010   HGBA1C 5.9 04/10/2011   MICROALBUR 0.2 12/13/2007       Assessment & Plan:  See problem list. Medications and labs reviewed today.  Insomnia and Night sweats - ongoing x 2 weeks - denies prior hx same ?stress/anxiety - concerned about possibility of future CVA from occlusion of carotids - see next PVD Check screening labs - use Ambien prn until further eval completed

## 2011-12-05 NOTE — Patient Instructions (Addendum)
It was good to see you today. Use Ambein as needed for sleep - Your prescription(s) have been submitted to your pharmacy. Please take as directed and contact our office if you believe you are having problem(s) with the medication(s). Test(s) ordered today. Your results will be called to you after review (48-72hours after test completion). If any changes need to be made, you will be notified at that time. we'll make referral for your carotid ultrasound. Our office will contact you regarding appointment(s) once made. Please schedule followup in 3-4 weeks, call sooner if problems.

## 2011-12-05 NOTE — Assessment & Plan Note (Signed)
Medical mgmt ongoing Annual follow up ultrasound reviewed

## 2011-12-17 ENCOUNTER — Ambulatory Visit: Payer: Medicare Other | Admitting: Internal Medicine

## 2011-12-18 ENCOUNTER — Encounter (INDEPENDENT_AMBULATORY_CARE_PROVIDER_SITE_OTHER): Payer: Medicare Other

## 2011-12-18 DIAGNOSIS — I6529 Occlusion and stenosis of unspecified carotid artery: Secondary | ICD-10-CM

## 2011-12-18 DIAGNOSIS — I739 Peripheral vascular disease, unspecified: Secondary | ICD-10-CM

## 2011-12-22 ENCOUNTER — Encounter: Payer: Self-pay | Admitting: Internal Medicine

## 2012-01-03 ENCOUNTER — Emergency Department (HOSPITAL_COMMUNITY): Payer: No Typology Code available for payment source

## 2012-01-03 ENCOUNTER — Inpatient Hospital Stay (HOSPITAL_COMMUNITY)
Admission: EM | Admit: 2012-01-03 | Discharge: 2012-01-05 | DRG: 155 | Disposition: A | Discharge status: Home / Self Care | Payer: No Typology Code available for payment source | Attending: Surgery | Admitting: Surgery

## 2012-01-03 DIAGNOSIS — N400 Enlarged prostate without lower urinary tract symptoms: Secondary | ICD-10-CM | POA: Diagnosis present

## 2012-01-03 DIAGNOSIS — S022XXA Fracture of nasal bones, initial encounter for closed fracture: Secondary | ICD-10-CM | POA: Diagnosis present

## 2012-01-03 DIAGNOSIS — M1990 Unspecified osteoarthritis, unspecified site: Secondary | ICD-10-CM | POA: Diagnosis present

## 2012-01-03 DIAGNOSIS — K219 Gastro-esophageal reflux disease without esophagitis: Secondary | ICD-10-CM | POA: Diagnosis present

## 2012-01-03 DIAGNOSIS — Z7982 Long term (current) use of aspirin: Secondary | ICD-10-CM

## 2012-01-03 DIAGNOSIS — I658 Occlusion and stenosis of other precerebral arteries: Secondary | ICD-10-CM | POA: Diagnosis present

## 2012-01-03 DIAGNOSIS — Z8673 Personal history of transient ischemic attack (TIA), and cerebral infarction without residual deficits: Secondary | ICD-10-CM

## 2012-01-03 DIAGNOSIS — Z85038 Personal history of other malignant neoplasm of large intestine: Secondary | ICD-10-CM

## 2012-01-03 DIAGNOSIS — I6529 Occlusion and stenosis of unspecified carotid artery: Secondary | ICD-10-CM | POA: Diagnosis present

## 2012-01-03 DIAGNOSIS — S2239XA Fracture of one rib, unspecified side, initial encounter for closed fracture: Secondary | ICD-10-CM

## 2012-01-03 DIAGNOSIS — R7309 Other abnormal glucose: Secondary | ICD-10-CM | POA: Diagnosis present

## 2012-01-03 DIAGNOSIS — S2249XA Multiple fractures of ribs, unspecified side, initial encounter for closed fracture: Secondary | ICD-10-CM

## 2012-01-03 DIAGNOSIS — I10 Essential (primary) hypertension: Secondary | ICD-10-CM | POA: Diagnosis present

## 2012-01-03 DIAGNOSIS — Z79899 Other long term (current) drug therapy: Secondary | ICD-10-CM

## 2012-01-03 DIAGNOSIS — Y998 Other external cause status: Secondary | ICD-10-CM

## 2012-01-03 DIAGNOSIS — Y92410 Unspecified street and highway as the place of occurrence of the external cause: Secondary | ICD-10-CM

## 2012-01-03 DIAGNOSIS — K5730 Diverticulosis of large intestine without perforation or abscess without bleeding: Secondary | ICD-10-CM | POA: Diagnosis present

## 2012-01-03 DIAGNOSIS — S0033XA Contusion of nose, initial encounter: Secondary | ICD-10-CM | POA: Diagnosis present

## 2012-01-03 DIAGNOSIS — S2243XA Multiple fractures of ribs, bilateral, initial encounter for closed fracture: Secondary | ICD-10-CM | POA: Diagnosis present

## 2012-01-03 LAB — BASIC METABOLIC PANEL
Calcium: 9.6 mg/dL (ref 8.4–10.5)
GFR calc non Af Amer: 65 mL/min — ABNORMAL LOW (ref >90)
Glucose, Bld: 112 mg/dL — ABNORMAL HIGH (ref 70–99)
Potassium: 4.6 mEq/L (ref 3.5–5.1)
Sodium: 135 mEq/L (ref 135–145)

## 2012-01-03 LAB — CBC WITH DIFFERENTIAL/PLATELET
Basophils Absolute: 0 10*3/uL (ref 0.0–0.1)
Eosinophils Absolute: 0.2 10*3/uL (ref 0.0–0.7)
Lymphocytes Relative: 20 % (ref 12–46)
Lymphs Abs: 2 10*3/uL (ref 0.7–4.0)
MCH: 30.1 pg (ref 26.0–34.0)
Neutrophils Relative %: 70 % (ref 43–77)
Platelets: 177 10*3/uL (ref 150–400)
RBC: 4.29 MIL/uL (ref 4.22–5.81)
RDW: 14.3 % (ref 11.5–15.5)
WBC: 10.1 10*3/uL (ref 4.0–10.5)

## 2012-01-03 MED ORDER — KCL IN DEXTROSE-NACL 10-5-0.45 MEQ/L-%-% IV SOLN
INTRAVENOUS | Status: DC
  Administered 2012-01-04 (×2): via INTRAVENOUS
  Filled 2012-01-03 (×3): qty 1000

## 2012-01-03 MED ORDER — PANTOPRAZOLE SODIUM 40 MG PO TBEC
40.0000 mg | DELAYED_RELEASE_TABLET | Freq: Every day | ORAL | Status: DC
  Administered 2012-01-04 – 2012-01-05 (×2): 40 mg via ORAL
  Filled 2012-01-03 (×2): qty 1

## 2012-01-03 MED ORDER — ONDANSETRON HCL 4 MG/2ML IJ SOLN
4.0000 mg | Freq: Four times a day (QID) | INTRAMUSCULAR | Status: DC | PRN
  Administered 2012-01-03: 4 mg via INTRAVENOUS
  Filled 2012-01-03: qty 2

## 2012-01-03 MED ORDER — ONDANSETRON HCL 4 MG PO TABS: 4.0000 mg | ORAL_TABLET | Freq: Four times a day (QID) | ORAL | Status: DC | PRN

## 2012-01-03 MED ORDER — AMLODIPINE BESYLATE 5 MG PO TABS
5.0000 mg | ORAL_TABLET | Freq: Every day | ORAL | Status: DC
  Administered 2012-01-04 – 2012-01-05 (×2): 5 mg via ORAL
  Filled 2012-01-03 (×2): qty 1

## 2012-01-03 MED ORDER — LISINOPRIL 20 MG PO TABS
20.0000 mg | ORAL_TABLET | Freq: Every day | ORAL | Status: DC
  Administered 2012-01-04 – 2012-01-05 (×2): 20 mg via ORAL
  Filled 2012-01-03 (×2): qty 1

## 2012-01-03 MED ORDER — OXYCODONE HCL 5 MG PO TABS
10.0000 mg | ORAL_TABLET | ORAL | Status: DC | PRN
  Administered 2012-01-04 – 2012-01-05 (×4): 10 mg via ORAL
  Filled 2012-01-03 (×4): qty 2

## 2012-01-03 MED ORDER — CEFAZOLIN SODIUM-DEXTROSE 2-3 GM-% IV SOLR
2.0000 g | Freq: Two times a day (BID) | INTRAVENOUS | Status: DC
  Administered 2012-01-04 (×3): 2 g via INTRAVENOUS
  Filled 2012-01-03 (×6): qty 50

## 2012-01-03 MED ORDER — IOHEXOL 300 MG/ML  SOLN
125.0000 mL | Freq: Once | INTRAMUSCULAR | Status: AC | PRN
  Administered 2012-01-03: 125 mL via INTRAVENOUS

## 2012-01-03 MED ORDER — LISINOPRIL 20 MG PO TABS: 20.0000 mg | ORAL_TABLET | Freq: Every day | ORAL | Status: DC

## 2012-01-03 MED ORDER — HYDROMORPHONE HCL PF 1 MG/ML IJ SOLN
1.0000 mg | INTRAMUSCULAR | Status: DC | PRN
  Administered 2012-01-03 – 2012-01-04 (×2): 1 mg via INTRAVENOUS
  Filled 2012-01-03 (×2): qty 1

## 2012-01-03 MED ORDER — AMLODIPINE BESYLATE 5 MG PO TABS: 5.0000 mg | ORAL_TABLET | Freq: Every day | ORAL | Status: DC

## 2012-01-03 MED ORDER — SODIUM CHLORIDE 0.9 % IV SOLN
Freq: Once | INTRAVENOUS | Status: AC
  Administered 2012-01-03: 17:00:00 via INTRAVENOUS

## 2012-01-03 MED ORDER — MORPHINE SULFATE 4 MG/ML IJ SOLN
4.0000 mg | Freq: Once | INTRAMUSCULAR | Status: AC
  Administered 2012-01-03: 4 mg via INTRAVENOUS

## 2012-01-03 MED ORDER — MORPHINE SULFATE 4 MG/ML IJ SOLN
INTRAMUSCULAR | Status: AC
  Administered 2012-01-03: 4 mg via INTRAVENOUS
  Filled 2012-01-03: qty 1
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
## 2012-01-03 NOTE — ED Notes (Signed)
All wounds cleaned and dressed #1 rt forearm covered with tegradrem, #2 bil knee multiple sites covered with tegraderm, #3 left hand covered with band aid- Assisted by Real Cons NT.  Pt tolerated- Family present

## 2012-01-03 NOTE — ED Notes (Signed)
MD at bedside updating family.  
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
## 2012-01-04 ENCOUNTER — Encounter (HOSPITAL_COMMUNITY): Payer: Self-pay

## 2012-01-04 ENCOUNTER — Inpatient Hospital Stay (HOSPITAL_COMMUNITY): Payer: No Typology Code available for payment source

## 2012-01-04 MED ORDER — POLYETHYLENE GLYCOL 3350 17 G PO PACK
17.0000 g | PACK | Freq: Every day | ORAL | Status: DC
  Administered 2012-01-04 – 2012-01-05 (×2): 17 g via ORAL
  Filled 2012-01-04 (×2): qty 1
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
## 2012-01-04 NOTE — ED Notes (Signed)
[facility] here for transport- Family present and aware of plan of care
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
## 2012-01-05 ENCOUNTER — Inpatient Hospital Stay (HOSPITAL_COMMUNITY): Payer: No Typology Code available for payment source

## 2012-01-05 DIAGNOSIS — S2243XA Multiple fractures of ribs, bilateral, initial encounter for closed fracture: Secondary | ICD-10-CM | POA: Diagnosis present

## 2012-01-05 DIAGNOSIS — S022XXA Fracture of nasal bones, initial encounter for closed fracture: Secondary | ICD-10-CM | POA: Diagnosis present

## 2012-01-05 DIAGNOSIS — S0033XA Contusion of nose, initial encounter: Secondary | ICD-10-CM | POA: Diagnosis present

## 2012-01-05 MED ORDER — OXYCODONE-ACETAMINOPHEN 7.5-325 MG PO TABS: 1.0000 | ORAL_TABLET | ORAL | Status: DC | PRN

## 2012-01-05 MED ORDER — CEPHALEXIN 500 MG PO CAPS
500.0000 mg | ORAL_CAPSULE | Freq: Four times a day (QID) | ORAL | Status: DC
  Administered 2012-01-05: 500 mg via ORAL
  Filled 2012-01-05 (×4): qty 1

## 2012-01-05 MED ORDER — NAPROXEN 500 MG PO TABS: 500.0000 mg | ORAL_TABLET | Freq: Two times a day (BID) | ORAL | Status: AC

## 2012-01-05 MED ORDER — OXYCODONE HCL 5 MG PO TABS
5.0000 mg | ORAL_TABLET | ORAL | Status: DC | PRN
  Administered 2012-01-05: 15 mg via ORAL
  Filled 2012-01-05: qty 3

## 2012-01-05 MED ORDER — CEPHALEXIN 500 MG PO CAPS: 500.0000 mg | ORAL_CAPSULE | Freq: Four times a day (QID) | ORAL | Status: AC

## 2012-01-05 MED ORDER — HYDROMORPHONE HCL PF 1 MG/ML IJ SOLN: 0.5000 mg | INTRAMUSCULAR | Status: DC | PRN

## 2012-01-05 NOTE — Progress Notes (Signed)
Pain and ecchymosis web space next to thumb.  Will check x-ray.  I spoke to patient and his daughter.  Ankle film neg. Patient examined and I agree with the assessment and plan  Violeta Gelinas, MD, MPH, FACS Pager: [phone]  01/05/2012 1:31 PM
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
## 2012-01-05 NOTE — Progress Notes (Signed)
HHPT and rolling walker arranged by patient choice with [facility]. Address and phone number listed in Epic are correct as confirmed with patient. Face to face form not needed for managed medicare.
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
## 2012-01-06 NOTE — Discharge Summary (Signed)
Richard Hepworth, MD, MPH, FACS Pager: [phone]  

## 2012-01-20 ENCOUNTER — Ambulatory Visit: Payer: Medicare Other | Admitting: Internal Medicine

## 2012-01-21 ENCOUNTER — Ambulatory Visit (INDEPENDENT_AMBULATORY_CARE_PROVIDER_SITE_OTHER): Payer: Medicare Other | Admitting: Internal Medicine

## 2012-01-21 ENCOUNTER — Telehealth: Payer: Self-pay | Admitting: Internal Medicine

## 2012-01-21 ENCOUNTER — Encounter: Payer: Self-pay | Admitting: Internal Medicine

## 2012-01-21 ENCOUNTER — Ambulatory Visit (INDEPENDENT_AMBULATORY_CARE_PROVIDER_SITE_OTHER)
Admission: RE | Admit: 2012-01-21 | Discharge: 2012-01-21 | Disposition: A | Discharge status: Home / Self Care | Payer: Medicare Other | Source: Ambulatory Visit | Attending: Internal Medicine | Admitting: Internal Medicine

## 2012-01-21 VITALS — BP 122/70 | HR 76 | Temp 97.6°F | Wt 209.0 lb

## 2012-01-21 DIAGNOSIS — K219 Gastro-esophageal reflux disease without esophagitis: Secondary | ICD-10-CM

## 2012-01-21 DIAGNOSIS — S2243XA Multiple fractures of ribs, bilateral, initial encounter for closed fracture: Secondary | ICD-10-CM

## 2012-01-21 DIAGNOSIS — R0789 Other chest pain: Secondary | ICD-10-CM

## 2012-01-21 DIAGNOSIS — M542 Cervicalgia: Secondary | ICD-10-CM

## 2012-01-21 DIAGNOSIS — M545 Low back pain, unspecified: Secondary | ICD-10-CM

## 2012-01-21 DIAGNOSIS — R079 Chest pain, unspecified: Secondary | ICD-10-CM

## 2012-01-21 DIAGNOSIS — S2249XA Multiple fractures of ribs, unspecified side, initial encounter for closed fracture: Secondary | ICD-10-CM

## 2012-01-21 DIAGNOSIS — I10 Essential (primary) hypertension: Secondary | ICD-10-CM

## 2012-01-21 DIAGNOSIS — S022XXA Fracture of nasal bones, initial encounter for closed fracture: Secondary | ICD-10-CM

## 2012-01-21 DIAGNOSIS — R071 Chest pain on breathing: Secondary | ICD-10-CM

## 2012-01-21 MED ORDER — FLUTICASONE PROPIONATE 50 MCG/ACT NA SUSP: 2.0000 | Freq: Every day | NASAL | Status: DC

## 2012-01-21 MED ORDER — AMOXICILLIN-POT CLAVULANATE 875-125 MG PO TABS: 1.0000 | ORAL_TABLET | Freq: Two times a day (BID) | ORAL | Status: AC

## 2012-01-21 NOTE — Assessment & Plan Note (Signed)
Continue with current prescription therapy as reflected on the Med list.  
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
## 2012-01-22 NOTE — Telephone Encounter (Signed)
Patient notified

## 2012-02-04 ENCOUNTER — Encounter: Payer: Self-pay | Admitting: Internal Medicine

## 2012-02-04 ENCOUNTER — Ambulatory Visit (INDEPENDENT_AMBULATORY_CARE_PROVIDER_SITE_OTHER): Payer: Medicare Other | Admitting: Internal Medicine

## 2012-02-04 DIAGNOSIS — R0789 Other chest pain: Secondary | ICD-10-CM

## 2012-02-04 DIAGNOSIS — R221 Localized swelling, mass and lump, neck: Secondary | ICD-10-CM

## 2012-02-04 DIAGNOSIS — R220 Localized swelling, mass and lump, head: Secondary | ICD-10-CM

## 2012-02-04 DIAGNOSIS — R071 Chest pain on breathing: Secondary | ICD-10-CM

## 2012-02-04 DIAGNOSIS — M542 Cervicalgia: Secondary | ICD-10-CM
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
## 2012-02-04 NOTE — Assessment & Plan Note (Signed)
He cont to have CP - he has to take pain pills bid
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
## 2012-02-10 ENCOUNTER — Ambulatory Visit: Payer: Medicare Other | Admitting: Internal Medicine

## 2012-03-10 ENCOUNTER — Encounter: Payer: Self-pay | Admitting: Internal Medicine

## 2012-03-10 ENCOUNTER — Ambulatory Visit (INDEPENDENT_AMBULATORY_CARE_PROVIDER_SITE_OTHER): Payer: Medicare Other | Admitting: Internal Medicine

## 2012-03-10 VITALS — BP 140/68 | HR 68 | Temp 97.9°F | Resp 16 | Wt 211.0 lb

## 2012-03-10 DIAGNOSIS — D485 Neoplasm of uncertain behavior of skin: Secondary | ICD-10-CM

## 2012-03-10 DIAGNOSIS — S2249XA Multiple fractures of ribs, unspecified side, initial encounter for closed fracture: Secondary | ICD-10-CM

## 2012-03-10 DIAGNOSIS — M545 Low back pain, unspecified: Secondary | ICD-10-CM

## 2012-03-10 DIAGNOSIS — S2243XA Multiple fractures of ribs, bilateral, initial encounter for closed fracture: Secondary | ICD-10-CM

## 2012-03-10 DIAGNOSIS — M542 Cervicalgia: Secondary | ICD-10-CM

## 2012-03-10 DIAGNOSIS — R071 Chest pain on breathing: Secondary | ICD-10-CM

## 2012-03-10 DIAGNOSIS — R0789 Other chest pain: Secondary | ICD-10-CM

## 2012-03-10 DIAGNOSIS — I10 Essential (primary) hypertension: Secondary | ICD-10-CM

## 2012-03-10 NOTE — Assessment & Plan Note (Signed)
Continue with current prescription therapy as reflected on the Med list.  

## 2012-03-10 NOTE — Patient Instructions (Signed)
Cont w/sretching exercises
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
## 2012-03-10 NOTE — Assessment & Plan Note (Signed)
50% better Cont Rx PT advised 

## 2012-03-10 NOTE — Assessment & Plan Note (Signed)
Overall 50% better
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
## 2012-03-10 NOTE — Assessment & Plan Note (Addendum)
50% better Cont Rx PT advised
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
## 2012-03-18 ENCOUNTER — Ambulatory Visit: Payer: Medicare Other | Attending: Internal Medicine

## 2012-03-18 DIAGNOSIS — M2560 Stiffness of unspecified joint, not elsewhere classified: Secondary | ICD-10-CM | POA: Insufficient documentation

## 2012-03-18 DIAGNOSIS — IMO0001 Reserved for inherently not codable concepts without codable children: Secondary | ICD-10-CM | POA: Insufficient documentation

## 2012-03-18 DIAGNOSIS — M2550 Pain in unspecified joint: Secondary | ICD-10-CM | POA: Insufficient documentation

## 2012-03-18 DIAGNOSIS — R5381 Other malaise: Secondary | ICD-10-CM | POA: Insufficient documentation

## 2012-03-23 ENCOUNTER — Ambulatory Visit: Payer: Medicare Other

## 2012-03-26 ENCOUNTER — Ambulatory Visit (INDEPENDENT_AMBULATORY_CARE_PROVIDER_SITE_OTHER): Payer: Medicare Other | Admitting: Internal Medicine

## 2012-03-26 ENCOUNTER — Ambulatory Visit (INDEPENDENT_AMBULATORY_CARE_PROVIDER_SITE_OTHER)
Admission: RE | Admit: 2012-03-26 | Discharge: 2012-03-26 | Disposition: A | Discharge status: Home / Self Care | Payer: Medicare Other | Source: Ambulatory Visit | Attending: Internal Medicine | Admitting: Internal Medicine

## 2012-03-26 ENCOUNTER — Encounter: Payer: Self-pay | Admitting: Internal Medicine

## 2012-03-26 ENCOUNTER — Telehealth: Payer: Self-pay | Admitting: Internal Medicine

## 2012-03-26 VITALS — BP 140/88 | HR 88 | Temp 97.0°F | Resp 16 | Wt 209.0 lb

## 2012-03-26 DIAGNOSIS — M25539 Pain in unspecified wrist: Secondary | ICD-10-CM

## 2012-03-26 DIAGNOSIS — M545 Low back pain, unspecified: Secondary | ICD-10-CM

## 2012-03-26 DIAGNOSIS — M25531 Pain in right wrist: Secondary | ICD-10-CM

## 2012-03-26 DIAGNOSIS — S2249XA Multiple fractures of ribs, unspecified side, initial encounter for closed fracture: Secondary | ICD-10-CM

## 2012-03-26 DIAGNOSIS — R221 Localized swelling, mass and lump, neck: Secondary | ICD-10-CM

## 2012-03-26 DIAGNOSIS — R220 Localized swelling, mass and lump, head: Secondary | ICD-10-CM

## 2012-03-26 DIAGNOSIS — I10 Essential (primary) hypertension: Secondary | ICD-10-CM

## 2012-03-26 DIAGNOSIS — S2243XA Multiple fractures of ribs, bilateral, initial encounter for closed fracture: Secondary | ICD-10-CM

## 2012-03-26 DIAGNOSIS — L570 Actinic keratosis: Secondary | ICD-10-CM

## 2012-03-26 NOTE — Assessment & Plan Note (Signed)
Continue with current prescription therapy as reflected on the Med list.  

## 2012-03-26 NOTE — Patient Instructions (Signed)
   Postprocedure instructions :     Keep the wounds clean. You can wash them with liquid soap and water. Pat dry with gauze or a Kleenex tissue  Before applying antibiotic ointment and a Band-Aid.   You need to report immediately  if  any signs of infection develop.    

## 2012-03-26 NOTE — Assessment & Plan Note (Signed)
In PT Continue with current prescription therapy as reflected on the Med list.

## 2012-03-26 NOTE — Assessment & Plan Note (Signed)
resolved 

## 2012-03-26 NOTE — Assessment & Plan Note (Signed)
X ray
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
## 2012-03-26 NOTE — Assessment & Plan Note (Signed)
See procedure 
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
## 2012-04-01 ENCOUNTER — Ambulatory Visit: Payer: Medicare Other

## 2012-04-01 NOTE — Telephone Encounter (Signed)
Pts wife informed.

## 2012-04-05 ENCOUNTER — Ambulatory Visit: Payer: Medicare Other

## 2012-04-06 ENCOUNTER — Ambulatory Visit: Payer: Medicare Other

## 2012-04-12 ENCOUNTER — Encounter: Payer: Medicare Other | Admitting: Rehabilitation

## 2012-04-14 ENCOUNTER — Ambulatory Visit: Payer: Medicare Other | Attending: Internal Medicine

## 2012-04-14 DIAGNOSIS — IMO0001 Reserved for inherently not codable concepts without codable children: Secondary | ICD-10-CM | POA: Insufficient documentation

## 2012-04-14 DIAGNOSIS — M2560 Stiffness of unspecified joint, not elsewhere classified: Secondary | ICD-10-CM | POA: Insufficient documentation

## 2012-04-14 DIAGNOSIS — R5381 Other malaise: Secondary | ICD-10-CM | POA: Insufficient documentation

## 2012-04-14 DIAGNOSIS — M2550 Pain in unspecified joint: Secondary | ICD-10-CM | POA: Insufficient documentation

## 2012-04-22 ENCOUNTER — Ambulatory Visit: Payer: Medicare Other

## 2012-04-26 ENCOUNTER — Ambulatory Visit: Payer: Medicare Other

## 2012-04-29 ENCOUNTER — Ambulatory Visit: Payer: Medicare Other

## 2012-05-03 ENCOUNTER — Ambulatory Visit: Payer: Medicare Other

## 2012-05-10 ENCOUNTER — Ambulatory Visit: Payer: No Typology Code available for payment source | Attending: Internal Medicine

## 2012-05-10 DIAGNOSIS — R5381 Other malaise: Secondary | ICD-10-CM | POA: Insufficient documentation

## 2012-05-10 DIAGNOSIS — M2560 Stiffness of unspecified joint, not elsewhere classified: Secondary | ICD-10-CM | POA: Insufficient documentation

## 2012-05-10 DIAGNOSIS — M2550 Pain in unspecified joint: Secondary | ICD-10-CM | POA: Insufficient documentation

## 2012-05-10 DIAGNOSIS — IMO0001 Reserved for inherently not codable concepts without codable children: Secondary | ICD-10-CM | POA: Insufficient documentation

## 2012-05-12 ENCOUNTER — Encounter: Payer: Self-pay | Admitting: Internal Medicine

## 2012-05-12 ENCOUNTER — Ambulatory Visit (INDEPENDENT_AMBULATORY_CARE_PROVIDER_SITE_OTHER): Payer: Medicare Other | Admitting: Internal Medicine

## 2012-05-12 VITALS — BP 140/72 | HR 76 | Temp 98.0°F | Resp 16 | Wt 215.0 lb

## 2012-05-12 DIAGNOSIS — M545 Low back pain, unspecified: Secondary | ICD-10-CM

## 2012-05-12 DIAGNOSIS — Z8679 Personal history of other diseases of the circulatory system: Secondary | ICD-10-CM

## 2012-05-12 DIAGNOSIS — M542 Cervicalgia: Secondary | ICD-10-CM

## 2012-05-12 MED ORDER — OXYCODONE-ACETAMINOPHEN 7.5-325 MG PO TABS: 1.0000 | ORAL_TABLET | ORAL | Status: AC | PRN

## 2012-05-12 MED ORDER — TRIAMCINOLONE ACETONIDE 0.5 % EX OINT: TOPICAL_OINTMENT | Freq: Two times a day (BID) | CUTANEOUS | Status: DC

## 2012-05-12 NOTE — Assessment & Plan Note (Signed)
Reviewed chart/x rays

## 2012-05-12 NOTE — Assessment & Plan Note (Signed)
Neck pain is better w/PT

## 2012-05-12 NOTE — Assessment & Plan Note (Signed)
No relapse 
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
## 2012-05-18 ENCOUNTER — Ambulatory Visit (HOSPITAL_COMMUNITY)
Admission: RE | Admit: 2012-05-18 | Discharge: 2012-05-18 | Disposition: A | Discharge status: Home / Self Care | Payer: Medicare Other | Source: Ambulatory Visit | Attending: Internal Medicine | Admitting: Internal Medicine

## 2012-05-18 DIAGNOSIS — M79609 Pain in unspecified limb: Secondary | ICD-10-CM | POA: Insufficient documentation

## 2012-05-18 DIAGNOSIS — M47817 Spondylosis without myelopathy or radiculopathy, lumbosacral region: Secondary | ICD-10-CM | POA: Insufficient documentation

## 2012-05-18 DIAGNOSIS — Q762 Congenital spondylolisthesis: Secondary | ICD-10-CM | POA: Insufficient documentation

## 2012-05-18 DIAGNOSIS — M545 Low back pain, unspecified: Secondary | ICD-10-CM | POA: Insufficient documentation

## 2012-05-18 DIAGNOSIS — M5126 Other intervertebral disc displacement, lumbar region: Secondary | ICD-10-CM | POA: Insufficient documentation

## 2012-05-19 ENCOUNTER — Telehealth: Payer: Self-pay | Admitting: Internal Medicine

## 2012-05-19 DIAGNOSIS — M545 Low back pain, unspecified: Secondary | ICD-10-CM

## 2012-05-19 NOTE — Telephone Encounter (Signed)
Pt informed- he does not remember who did his surgery. 1994 was the last one he had.

## 2012-05-19 NOTE — Telephone Encounter (Signed)
Richard Burgess, please, inform patient that his back MRI is abnormal Who operated on his back before? Thx

## 2012-05-20 ENCOUNTER — Telehealth: Payer: Self-pay | Admitting: Internal Medicine

## 2012-05-20 NOTE — Telephone Encounter (Signed)
Caller: Caelin/Patient; Phone: [phone]; Reason for Call: Patient states that he received a call from [facility] 05/19/12 informing him that his MRI was abnormal and was inquiring as to who last operated on his back.  Patient would like follow up as to what is abnormal with the MRI and what needs to be done going forward.  OFFICE NOTE: PLEASE FOLLOW UP WITH PATIENT

## 2012-05-21 NOTE — Telephone Encounter (Signed)
Addressed. See other tel note Thx 

## 2012-05-21 NOTE — Telephone Encounter (Signed)
Ortho consult Dr Alveda Reasons (done) Thx

## 2012-06-10 ENCOUNTER — Other Ambulatory Visit (INDEPENDENT_AMBULATORY_CARE_PROVIDER_SITE_OTHER): Payer: Medicare Other

## 2012-06-10 ENCOUNTER — Telehealth: Payer: Self-pay | Admitting: Internal Medicine

## 2012-06-10 ENCOUNTER — Ambulatory Visit (INDEPENDENT_AMBULATORY_CARE_PROVIDER_SITE_OTHER): Payer: Medicare Other | Admitting: Internal Medicine

## 2012-06-10 ENCOUNTER — Encounter: Payer: Self-pay | Admitting: Internal Medicine

## 2012-06-10 VITALS — BP 140/76 | HR 80 | Temp 97.5°F | Resp 16 | Wt 215.0 lb

## 2012-06-10 DIAGNOSIS — M545 Low back pain, unspecified: Secondary | ICD-10-CM

## 2012-06-10 DIAGNOSIS — R209 Unspecified disturbances of skin sensation: Secondary | ICD-10-CM

## 2012-06-10 DIAGNOSIS — E538 Deficiency of other specified B group vitamins: Secondary | ICD-10-CM | POA: Insufficient documentation

## 2012-06-10 DIAGNOSIS — R202 Paresthesia of skin: Secondary | ICD-10-CM

## 2012-06-10 DIAGNOSIS — E785 Hyperlipidemia, unspecified: Secondary | ICD-10-CM

## 2012-06-10 DIAGNOSIS — M25531 Pain in right wrist: Secondary | ICD-10-CM

## 2012-06-10 DIAGNOSIS — I10 Essential (primary) hypertension: Secondary | ICD-10-CM

## 2012-06-10 DIAGNOSIS — K219 Gastro-esophageal reflux disease without esophagitis: Secondary | ICD-10-CM

## 2012-06-10 DIAGNOSIS — M25539 Pain in unspecified wrist: Secondary | ICD-10-CM

## 2012-06-10 LAB — VITAMIN B12: Vitamin B-12: 196 pg/mL — ABNORMAL LOW (ref 211–911)

## 2012-06-10 LAB — CBC WITH DIFFERENTIAL/PLATELET
Basophils Absolute: 0 10*3/uL (ref 0.0–0.1)
Lymphocytes Relative: 28 % (ref 12.0–46.0)
Lymphs Abs: 2.2 10*3/uL (ref 0.7–4.0)
Monocytes Relative: 8.8 % (ref 3.0–12.0)
Neutrophils Relative %: 59.3 % (ref 43.0–77.0)
Platelets: 182 10*3/uL (ref 150.0–400.0)
RDW: 14.2 % (ref 11.5–14.6)

## 2012-06-10 LAB — BASIC METABOLIC PANEL
Calcium: 9.5 mg/dL (ref 8.4–10.5)
Creatinine, Ser: 1 mg/dL (ref 0.4–1.5)
GFR: 72.76 mL/min (ref >60.00)
Sodium: 136 mEq/L (ref 135–145)

## 2012-06-10 LAB — HEPATIC FUNCTION PANEL
AST: 22 U/L (ref 0–37)
Alkaline Phosphatase: 62 U/L (ref 39–117)
Bilirubin, Direct: 0.1 mg/dL (ref 0.0–0.3)
Total Bilirubin: 1.2 mg/dL (ref 0.3–1.2)

## 2012-06-10 LAB — TSH: TSH: 3.2 u[IU]/mL (ref 0.35–5.50)

## 2012-06-10 MED ORDER — VITAMIN B-12 1000 MCG SL SUBL: 1.0000 | SUBLINGUAL_TABLET | Freq: Every day | SUBLINGUAL | Status: DC

## 2012-06-10 NOTE — Assessment & Plan Note (Signed)
Better  
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
## 2012-06-10 NOTE — Assessment & Plan Note (Signed)
Start B12 IM/SL
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
## 2012-06-10 NOTE — Telephone Encounter (Signed)
Richard Burgess, please, inform patient that all labs are normal except for low vit B12 Come to get a shot Start SL B12 daily Thx

## 2012-06-10 NOTE — Assessment & Plan Note (Signed)
Continue with current prescription therapy as reflected on the Med list.  

## 2012-06-11 NOTE — Telephone Encounter (Signed)
Left detailed mess informing pt of below.  

## 2012-06-15 ENCOUNTER — Encounter: Payer: Self-pay | Admitting: Internal Medicine

## 2012-06-15 ENCOUNTER — Ambulatory Visit (INDEPENDENT_AMBULATORY_CARE_PROVIDER_SITE_OTHER): Payer: Medicare Other | Admitting: Internal Medicine

## 2012-06-15 VITALS — BP 140/80 | HR 84 | Temp 98.0°F | Resp 16

## 2012-06-15 DIAGNOSIS — R0789 Other chest pain: Secondary | ICD-10-CM

## 2012-06-15 DIAGNOSIS — R140 Abdominal distension (gaseous): Secondary | ICD-10-CM

## 2012-06-15 DIAGNOSIS — R143 Flatulence: Secondary | ICD-10-CM

## 2012-06-15 DIAGNOSIS — E538 Deficiency of other specified B group vitamins: Secondary | ICD-10-CM

## 2012-06-15 DIAGNOSIS — R141 Gas pain: Secondary | ICD-10-CM

## 2012-06-15 DIAGNOSIS — M545 Low back pain, unspecified: Secondary | ICD-10-CM

## 2012-06-15 DIAGNOSIS — R071 Chest pain on breathing: Secondary | ICD-10-CM

## 2012-06-15 MED ORDER — CYANOCOBALAMIN 1000 MCG/ML IJ SOLN
1000.0000 ug | Freq: Once | INTRAMUSCULAR | Status: AC
  Administered 2012-06-15: 1000 ug via INTRAMUSCULAR

## 2012-06-15 MED ORDER — VITAMIN B-12 1000 MCG SL SUBL: 1.0000 | SUBLINGUAL_TABLET | Freq: Every day | SUBLINGUAL | Status: DC

## 2012-06-15 NOTE — Assessment & Plan Note (Signed)
Overall better by 50%
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
## 2012-06-15 NOTE — Assessment & Plan Note (Signed)
Much better 

## 2012-06-15 NOTE — Assessment & Plan Note (Addendum)
Empiric Flagyl if not better Creon w/meals Tests if not resolved

## 2012-06-15 NOTE — Patient Instructions (Addendum)
Creon --- 1 with meals

## 2012-06-15 NOTE — Assessment & Plan Note (Signed)
Start B12 IM/SL 

## 2012-06-29 ENCOUNTER — Other Ambulatory Visit: Payer: Self-pay | Admitting: Internal Medicine

## 2012-06-30 ENCOUNTER — Other Ambulatory Visit: Payer: Self-pay | Admitting: *Deleted

## 2012-07-05 ENCOUNTER — Other Ambulatory Visit: Payer: Self-pay | Admitting: *Deleted

## 2012-07-07 MED ORDER — ZOLPIDEM TARTRATE 5 MG PO TABS: 5.0000 mg | ORAL_TABLET | Freq: Every evening | ORAL | Status: DC | PRN

## 2012-07-08 ENCOUNTER — Other Ambulatory Visit: Payer: Self-pay | Admitting: Internal Medicine

## 2012-08-16 ENCOUNTER — Ambulatory Visit (INDEPENDENT_AMBULATORY_CARE_PROVIDER_SITE_OTHER): Payer: Medicare Other | Admitting: Internal Medicine

## 2012-08-16 ENCOUNTER — Encounter: Payer: Self-pay | Admitting: Internal Medicine

## 2012-08-16 VITALS — BP 128/72 | HR 72 | Temp 97.4°F | Resp 16 | Wt 212.0 lb

## 2012-08-16 DIAGNOSIS — M25569 Pain in unspecified knee: Secondary | ICD-10-CM

## 2012-08-16 DIAGNOSIS — R141 Gas pain: Secondary | ICD-10-CM

## 2012-08-16 DIAGNOSIS — R140 Abdominal distension (gaseous): Secondary | ICD-10-CM

## 2012-08-16 DIAGNOSIS — M545 Low back pain, unspecified: Secondary | ICD-10-CM

## 2012-08-16 DIAGNOSIS — E538 Deficiency of other specified B group vitamins: Secondary | ICD-10-CM

## 2012-08-16 DIAGNOSIS — M25561 Pain in right knee: Secondary | ICD-10-CM

## 2012-08-16 MED ORDER — METHYLPREDNISOLONE ACETATE 80 MG/ML IJ SUSP: 80.0000 mg | Freq: Once | INTRAMUSCULAR | Status: DC

## 2012-08-16 MED ORDER — TRAMADOL HCL 50 MG PO TABS: 50.0000 mg | ORAL_TABLET | Freq: Three times a day (TID) | ORAL | Status: DC | PRN

## 2012-08-16 NOTE — Assessment & Plan Note (Signed)
Continue with current prescription therapy as reflected on the Med list.  

## 2012-08-16 NOTE — Assessment & Plan Note (Signed)
Much better 

## 2012-08-16 NOTE — Patient Instructions (Addendum)
Postprocedure instructions :    A Band-Aid should be left on for 12 hours. Injection therapy is not a cure itself. It is used in conjunction with other modalities. You can use nonsteroidal anti-inflammatories like ibuprofen , hot and cold compresses. Rest is recommended in the next 24 hours. You need to report immediately  if fever, chills or any signs of infection develop. 
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
## 2012-08-16 NOTE — Assessment & Plan Note (Signed)
3/14 chronic severe OA - worse Options discussed Will inject Ortho consult

## 2012-08-16 NOTE — Assessment & Plan Note (Signed)
He is still dealing with lots of aches and pains

## 2012-08-19 ENCOUNTER — Other Ambulatory Visit: Payer: Self-pay | Admitting: Internal Medicine

## 2012-08-30 ENCOUNTER — Encounter (INDEPENDENT_AMBULATORY_CARE_PROVIDER_SITE_OTHER): Payer: Medicare Other

## 2012-08-30 ENCOUNTER — Other Ambulatory Visit: Payer: Self-pay

## 2012-08-30 DIAGNOSIS — I6529 Occlusion and stenosis of unspecified carotid artery: Secondary | ICD-10-CM

## 2012-08-30 MED ORDER — OMEPRAZOLE 40 MG PO CPDR: 40.0000 mg | DELAYED_RELEASE_CAPSULE | Freq: Every day | ORAL | Status: DC

## 2012-08-30 MED ORDER — AMLODIPINE BESYLATE 5 MG PO TABS: 5.0000 mg | ORAL_TABLET | Freq: Every day | ORAL | Status: DC

## 2012-10-01 ENCOUNTER — Other Ambulatory Visit: Payer: Self-pay | Admitting: Internal Medicine

## 2012-10-05 ENCOUNTER — Other Ambulatory Visit: Payer: Self-pay | Admitting: Internal Medicine

## 2012-10-05 NOTE — Telephone Encounter (Signed)
MD out of office. Pls advise.../lmb 

## 2012-10-05 NOTE — Telephone Encounter (Signed)
Zolpidem called to pharmacy  

## 2012-10-14 ENCOUNTER — Other Ambulatory Visit: Payer: Self-pay | Admitting: Internal Medicine

## 2012-10-19 ENCOUNTER — Encounter: Payer: Self-pay | Admitting: Internal Medicine

## 2012-10-19 ENCOUNTER — Ambulatory Visit (INDEPENDENT_AMBULATORY_CARE_PROVIDER_SITE_OTHER): Payer: Medicare Other | Admitting: Internal Medicine

## 2012-10-19 VITALS — BP 130/80 | HR 72 | Temp 97.9°F | Resp 16 | Wt 202.0 lb

## 2012-10-19 DIAGNOSIS — M545 Low back pain, unspecified: Secondary | ICD-10-CM

## 2012-10-19 DIAGNOSIS — I10 Essential (primary) hypertension: Secondary | ICD-10-CM

## 2012-10-19 DIAGNOSIS — I739 Peripheral vascular disease, unspecified: Secondary | ICD-10-CM

## 2012-10-19 DIAGNOSIS — I6529 Occlusion and stenosis of unspecified carotid artery: Secondary | ICD-10-CM

## 2012-10-19 DIAGNOSIS — E538 Deficiency of other specified B group vitamins: Secondary | ICD-10-CM

## 2012-10-19 DIAGNOSIS — I6521 Occlusion and stenosis of right carotid artery: Secondary | ICD-10-CM

## 2012-10-19 NOTE — Assessment & Plan Note (Signed)
Continue with current prescription therapy as reflected on the Med list.  
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
## 2012-10-19 NOTE — Patient Instructions (Signed)
You can try Red Rice Yeast Extract pills for cholesterol

## 2012-10-21 ENCOUNTER — Encounter: Payer: Self-pay | Admitting: Internal Medicine

## 2012-10-22 ENCOUNTER — Telehealth: Payer: Self-pay | Admitting: *Deleted

## 2012-10-22 NOTE — Telephone Encounter (Signed)
Left msg on triage requesting to speak with nurse concerning labs...lmb

## 2012-10-26 ENCOUNTER — Telehealth: Payer: Self-pay | Admitting: Internal Medicine

## 2012-10-26 DIAGNOSIS — E785 Hyperlipidemia, unspecified: Secondary | ICD-10-CM

## 2012-10-26 NOTE — Telephone Encounter (Signed)
Please give Olegario Messier a call.  She would like to discuss her dads labs.  Did put DPR on file today.  Thanks!

## 2012-10-26 NOTE — Telephone Encounter (Signed)
I called Olegario Messier (pt's daughter) and informed her that we need a Administrator, Civil Service form signed by patient allowing [place] to talk to her about his labs, or appointments. She states she will bring the patient to the office to have him complete DPR.

## 2012-10-27 NOTE — Telephone Encounter (Signed)
Pt's daughter states someone informed him to start taking Red yeast rice for his cholesterol and she says Mychart shows his lipids has not been checked since 2012. She states the [place] may have been who told pt this. Also, she is concerned abut his elevated glucose. Should pt be doing anything for this?

## 2012-10-28 NOTE — Telephone Encounter (Signed)
OK lipids, BMET, A1c Thx

## 2012-10-28 NOTE — Telephone Encounter (Signed)
He is intolerant of cholesterol meds - no real reason to test chol often since we can't do much about iy. Red rice yeast is related to chol meds he is intolerant of. He can try fish oil  His sugars were not bad He is due labs this May - it was odered  Do labs ROV as sch  Thx!

## 2012-10-28 NOTE — Telephone Encounter (Signed)
Pt's daughter informed of below. She wants his lipids checked anyway. Please advise.

## 2012-10-29 NOTE — Telephone Encounter (Signed)
Lipids added to previously entered labs.

## 2012-11-05 ENCOUNTER — Other Ambulatory Visit (INDEPENDENT_AMBULATORY_CARE_PROVIDER_SITE_OTHER): Payer: Medicare Other

## 2012-11-05 DIAGNOSIS — E538 Deficiency of other specified B group vitamins: Secondary | ICD-10-CM

## 2012-11-05 DIAGNOSIS — R141 Gas pain: Secondary | ICD-10-CM

## 2012-11-05 DIAGNOSIS — R142 Eructation: Secondary | ICD-10-CM

## 2012-11-05 DIAGNOSIS — R140 Abdominal distension (gaseous): Secondary | ICD-10-CM

## 2012-11-05 LAB — BASIC METABOLIC PANEL
BUN: 17 mg/dL (ref 6–23)
CO2: 26 mEq/L (ref 19–32)
Calcium: 9.7 mg/dL (ref 8.4–10.5)
Glucose, Bld: 125 mg/dL — ABNORMAL HIGH (ref 70–99)
Sodium: 137 mEq/L (ref 135–145)

## 2012-11-07 ENCOUNTER — Encounter: Payer: Self-pay | Admitting: Internal Medicine

## 2012-11-08 MED ORDER — LISINOPRIL 20 MG PO TABS: 20.0000 mg | ORAL_TABLET | Freq: Every day | ORAL | Status: DC

## 2012-11-09 ENCOUNTER — Telehealth: Payer: Self-pay | Admitting: Surgery

## 2012-11-09 ENCOUNTER — Other Ambulatory Visit: Payer: Self-pay

## 2012-11-09 NOTE — Telephone Encounter (Addendum)
Message copied by Fredrich Birks on Tue Nov 09, 2012 11:33 AM ------      Message from: Phillips Odor      Created: Tue Nov 09, 2012  9:01 AM      Regarding: FW: direction       I will place the order for the carotid duplex/ Thx.      ----- Message -----         From: Nada Libman, MD         Sent: 11/08/2012   5:05 PM           To: Conley Simmonds Pullins, RN      Subject: RE: direction                                            Schedule husband for new visit and carotid duplex with me in 6 months      ----- Message -----         From: Erenest Blank, RN         Sent: 11/04/2012  12:12 PM           To: Nada Libman, MD      Subject: direction                                                Brennan Bailey ([birth year]) called and said while she was in the hospital, they gave you a disc to look at her husband's Carotid artery scan, and said the office would call with further directions.  Do I need to make an appt. for him, and does he need a carotid duplex?             ------   I spoke with Ms Bartholomew to inform of appointment in December- she stated she was expecting our call, Dr Myra Gianotti had explained it to her. dpm

## 2012-11-10 ENCOUNTER — Encounter: Payer: Self-pay | Admitting: Internal Medicine

## 2012-11-11 ENCOUNTER — Ambulatory Visit: Payer: Medicare Other

## 2012-11-11 DIAGNOSIS — E785 Hyperlipidemia, unspecified: Secondary | ICD-10-CM

## 2012-11-11 LAB — LIPID PANEL
HDL: 37.4 mg/dL — ABNORMAL LOW (ref >39.00)
VLDL: 29.4 mg/dL (ref 0.0–40.0)

## 2012-11-11 LAB — LDL CHOLESTEROL, DIRECT: Direct LDL: 162.2 mg/dL

## 2012-12-10 ENCOUNTER — Encounter: Payer: Self-pay | Admitting: Internal Medicine

## 2012-12-15 ENCOUNTER — Ambulatory Visit: Payer: Medicare Other | Admitting: Internal Medicine

## 2013-01-12 ENCOUNTER — Other Ambulatory Visit: Payer: Self-pay

## 2013-01-14 ENCOUNTER — Other Ambulatory Visit: Payer: Self-pay | Admitting: Internal Medicine

## 2013-01-14 NOTE — Telephone Encounter (Signed)
Refill request for zolpidem Last filled by MD on - 10/13/12 #30 x3 Last seen- 10/19/12 F/U appt: 01/25/13 Please advise refill?

## 2013-01-18 ENCOUNTER — Other Ambulatory Visit: Payer: Self-pay | Admitting: Internal Medicine

## 2013-01-18 NOTE — Telephone Encounter (Signed)
Spoke to pharmacy, they did not receive previous rx sent in on 8.8.14. Re-sent rx to pharmacy.  Refill done.

## 2013-01-25 ENCOUNTER — Encounter: Payer: Self-pay | Admitting: Internal Medicine

## 2013-01-25 ENCOUNTER — Ambulatory Visit (INDEPENDENT_AMBULATORY_CARE_PROVIDER_SITE_OTHER): Payer: Medicare Other | Admitting: Internal Medicine

## 2013-01-25 VITALS — BP 130/70 | HR 72 | Temp 97.8°F | Resp 16 | Wt 206.0 lb

## 2013-01-25 DIAGNOSIS — M545 Low back pain, unspecified: Secondary | ICD-10-CM

## 2013-01-25 DIAGNOSIS — E785 Hyperlipidemia, unspecified: Secondary | ICD-10-CM

## 2013-01-25 DIAGNOSIS — R7309 Other abnormal glucose: Secondary | ICD-10-CM

## 2013-01-25 DIAGNOSIS — B356 Tinea cruris: Secondary | ICD-10-CM

## 2013-01-25 DIAGNOSIS — E538 Deficiency of other specified B group vitamins: Secondary | ICD-10-CM

## 2013-01-25 DIAGNOSIS — I10 Essential (primary) hypertension: Secondary | ICD-10-CM

## 2013-01-25 MED ORDER — COLESEVELAM HCL 625 MG PO TABS: 1250.0000 mg | ORAL_TABLET | Freq: Two times a day (BID) | ORAL | Status: DC

## 2013-01-25 MED ORDER — KETOCONAZOLE 2 % EX CREA: TOPICAL_CREAM | Freq: Two times a day (BID) | CUTANEOUS | Status: DC

## 2013-01-25 NOTE — Assessment & Plan Note (Signed)
Continue with current prescription therapy as reflected on the Med list.  
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
## 2013-01-25 NOTE — Assessment & Plan Note (Addendum)
Continue with current prescription therapy as reflected on the Med list. Discussed options - we can try Welchol. Zetia is another option Pt is concerned of cost.

## 2013-01-25 NOTE — Assessment & Plan Note (Signed)
Start welchol

## 2013-01-25 NOTE — Assessment & Plan Note (Addendum)
Ketoconazole bid 

## 2013-02-28 ENCOUNTER — Other Ambulatory Visit: Payer: Self-pay | Admitting: *Deleted

## 2013-02-28 MED ORDER — OMEPRAZOLE 40 MG PO CPDR: 40.0000 mg | DELAYED_RELEASE_CAPSULE | Freq: Every day | ORAL | Status: DC

## 2013-02-28 MED ORDER — AMLODIPINE BESYLATE 5 MG PO TABS: 5.0000 mg | ORAL_TABLET | Freq: Every day | ORAL | Status: DC

## 2013-03-01 IMAGING — CR DG CHEST 2V
2 series · 2 of 2 positions shown · non-contrast
Comparison: February 23, 2008

CLINICAL DATA: Chest pain and headache

CHEST - 2 VIEW

[w chest pa]
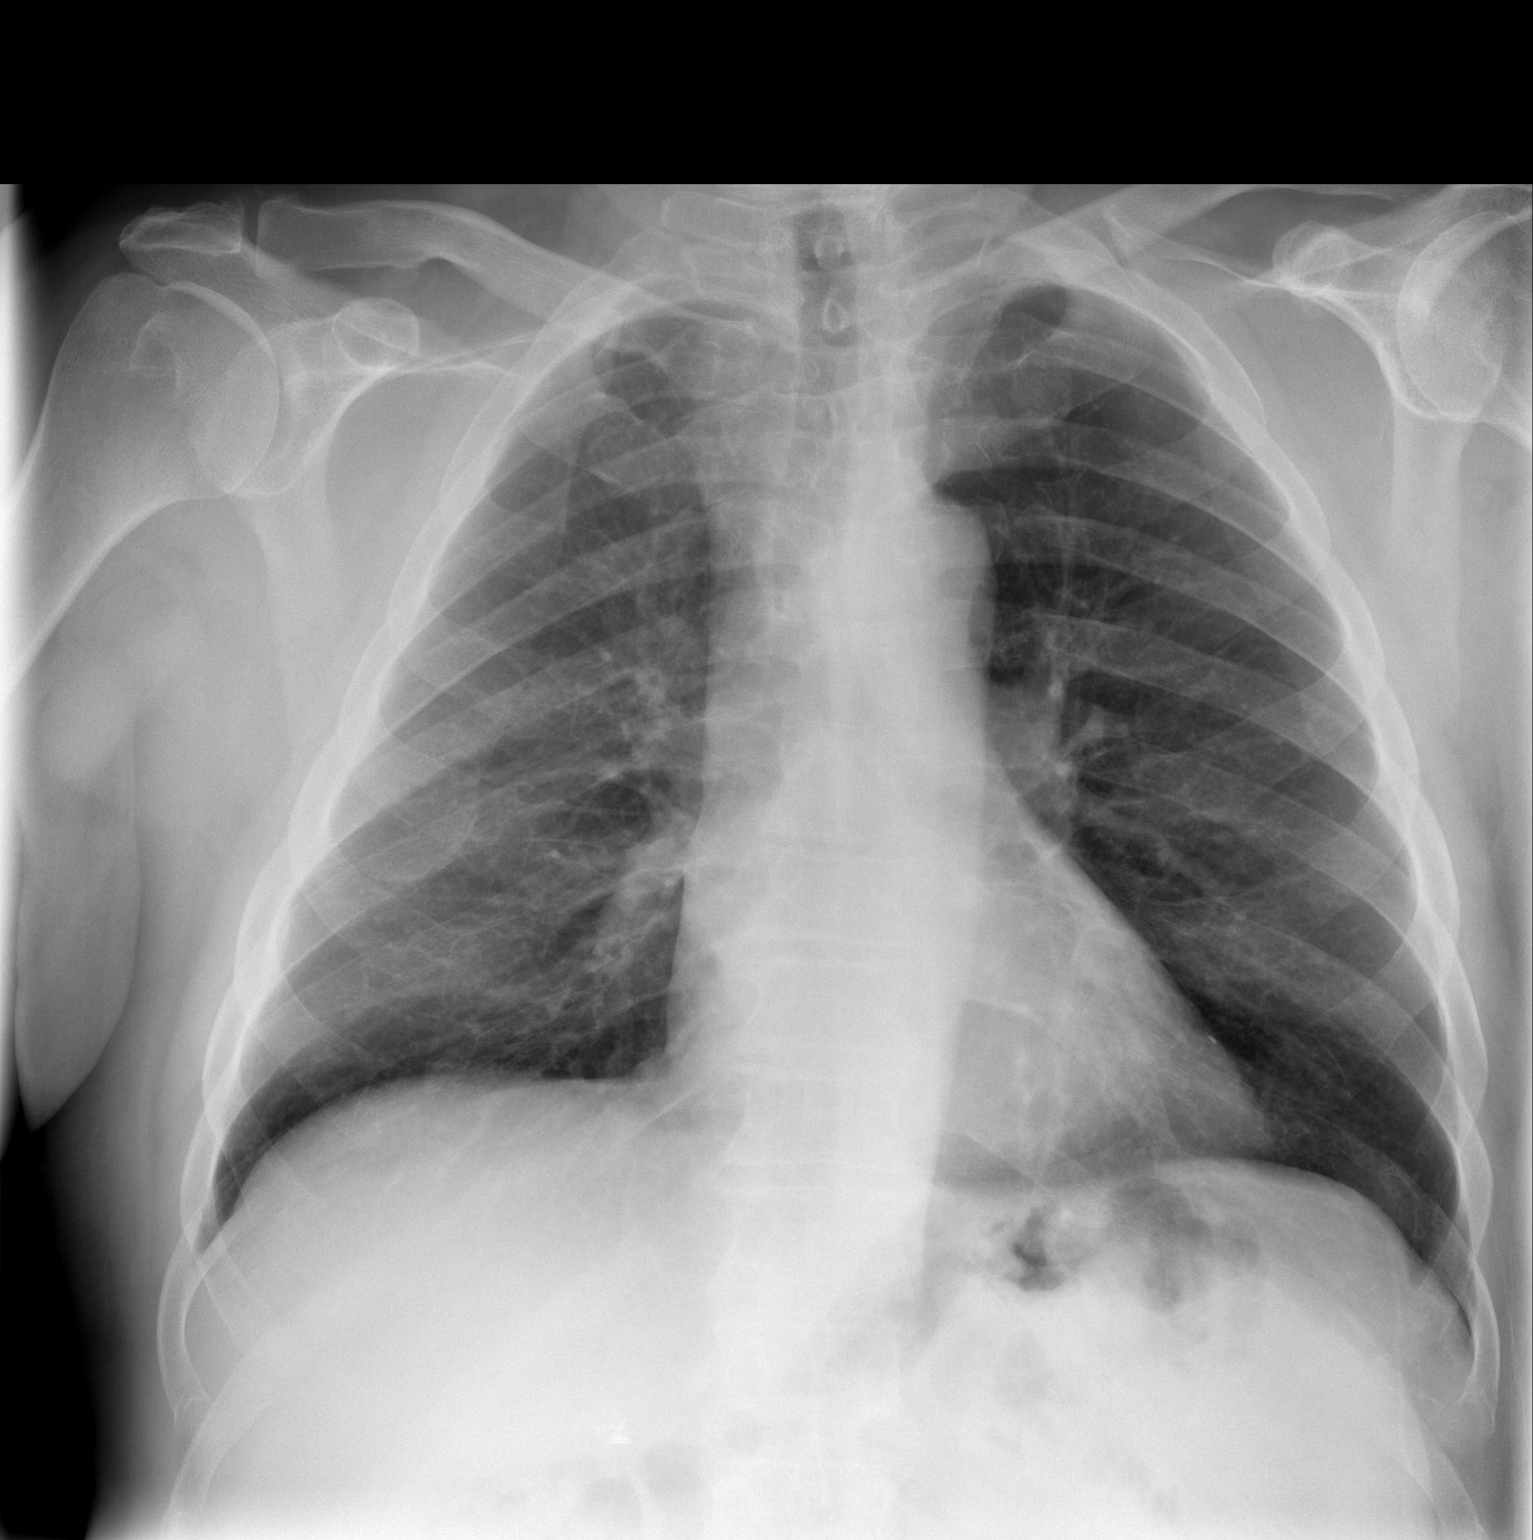

[w chest lat]
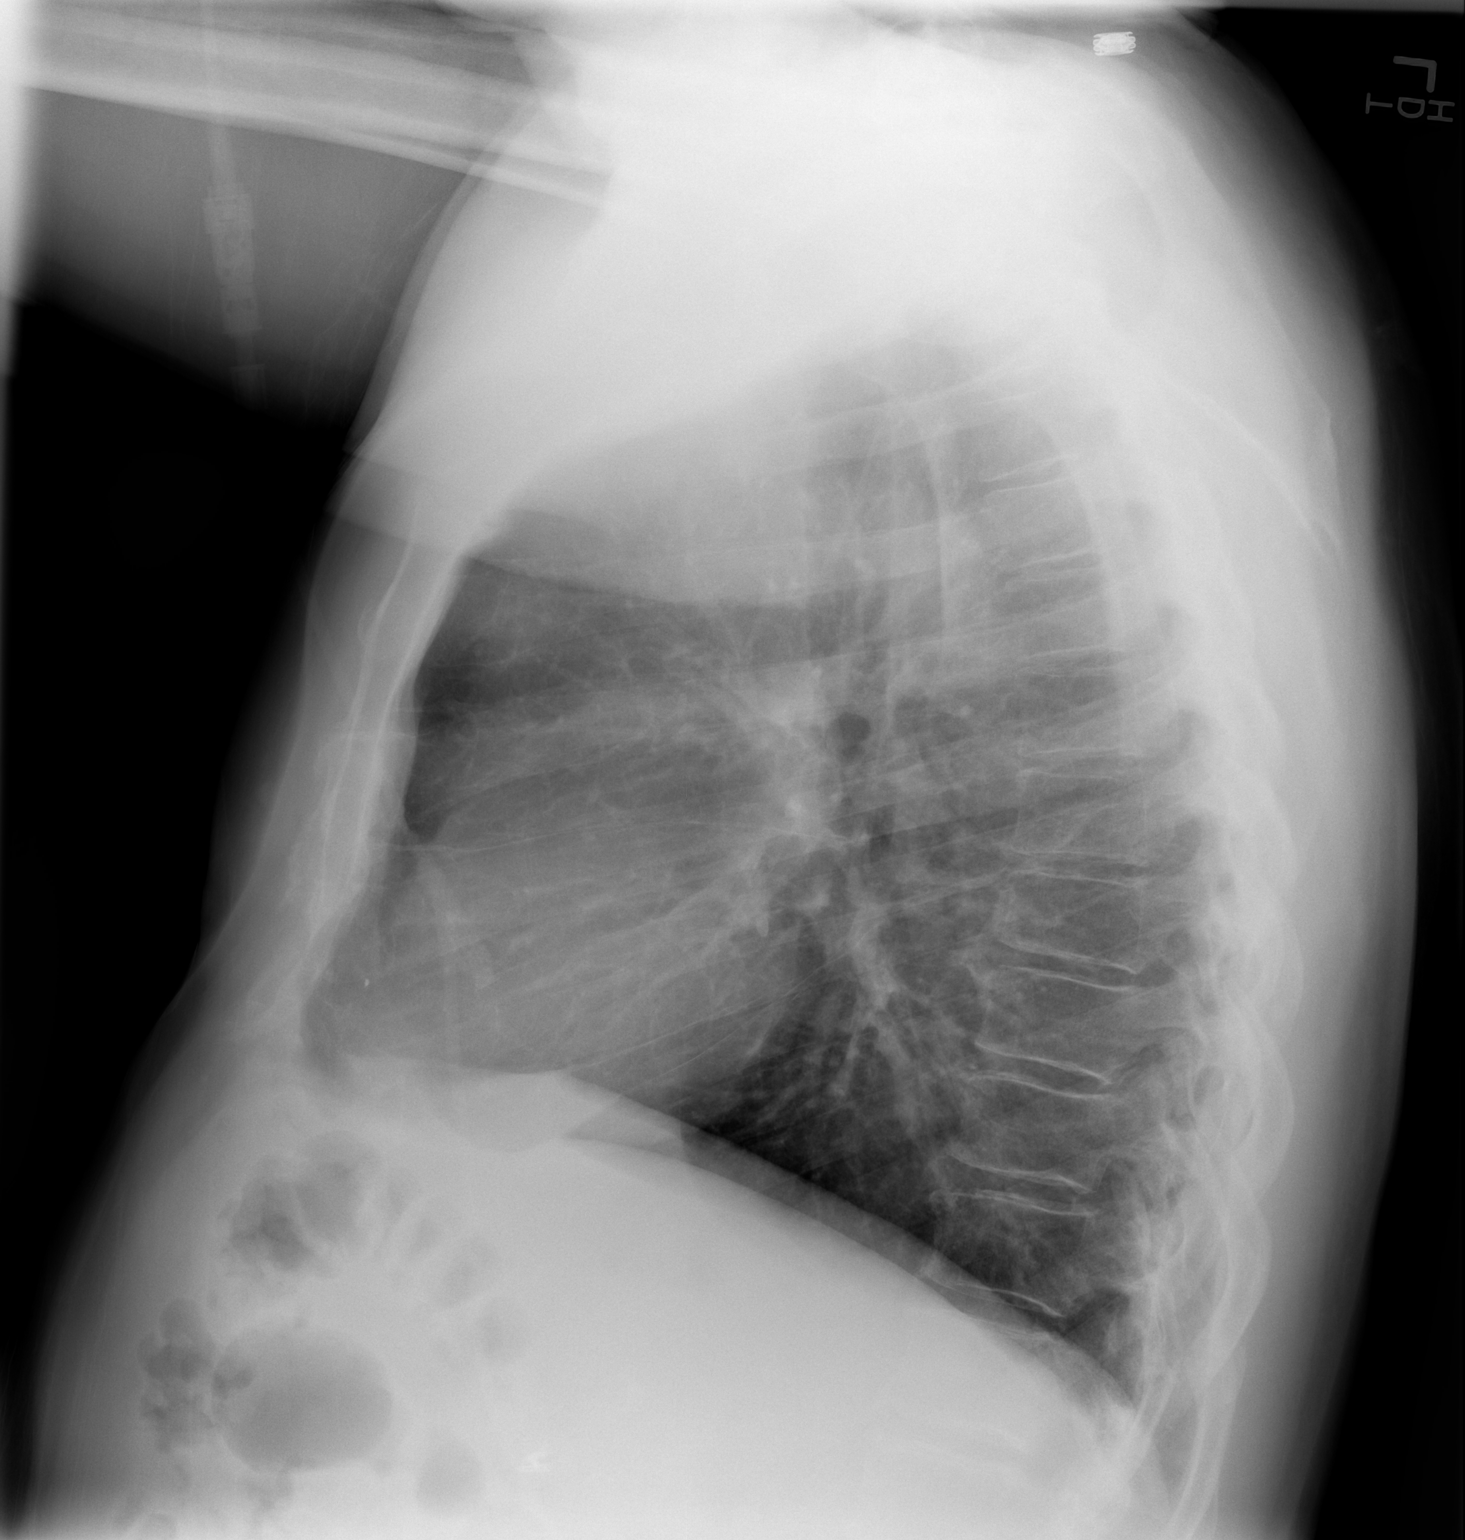

[2 of 2 positions shown; findings below may reference images not displayed]

FINDINGS: The cardiac silhouette, mediastinum, pulmonary
vasculature are within normal limits.  Both lungs are clear.
There is no acute bony abnormality.
IMPRESSION: There is no evidence of acute cardiac or pulmonary process.

## 2013-03-01 IMAGING — CT CT HEAD W/O CM
1 of 2 series · 13 of 30 positions shown, 17 images · non-contrast
Comparison: [DATE] no

CLINICAL DATA: Chest pain and headache

CT HEAD WITHOUT CONTRAST
TECHNIQUE: Contiguous axial images were obtained from the base of
the skull through the vertex without contrast.

[Series 2: brain · axial · 0.47mm/px · z∈[+147,+289]mm · 13 of 32 slices shown, 17 images]
[im 3/32  brain]
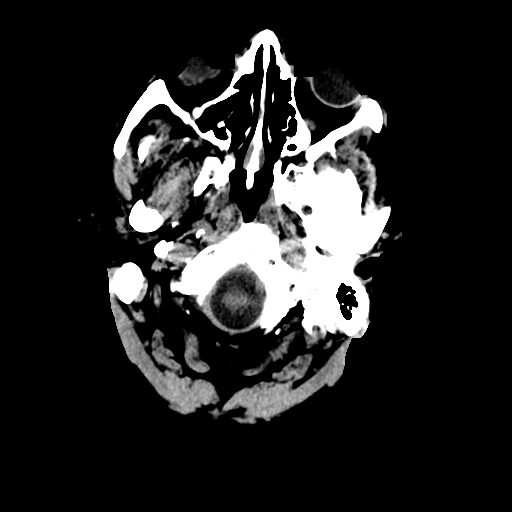
[im 3/32  bone]
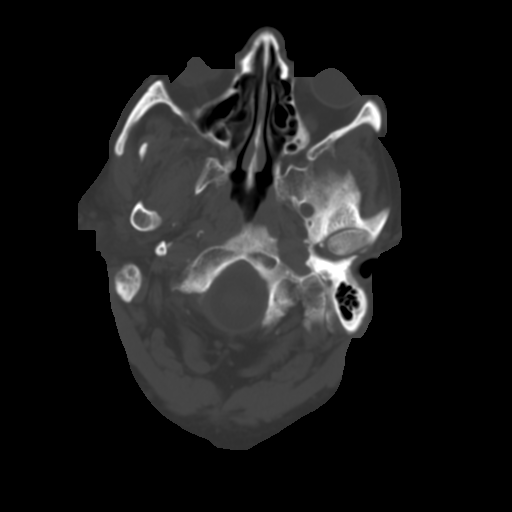
[im 5/32  brain]
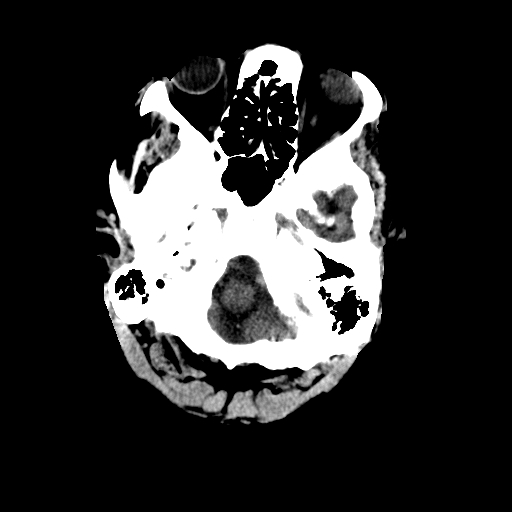
[im 7/32  brain]
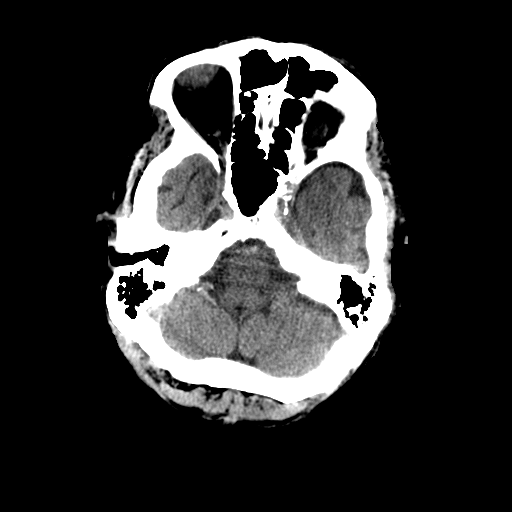
[im 9/32  brain]
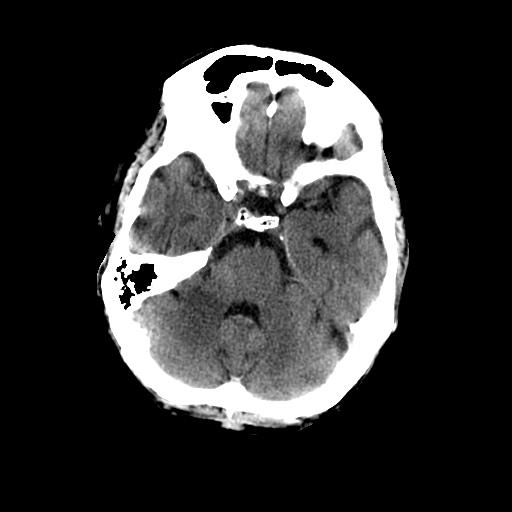
[im 12/32  brain]
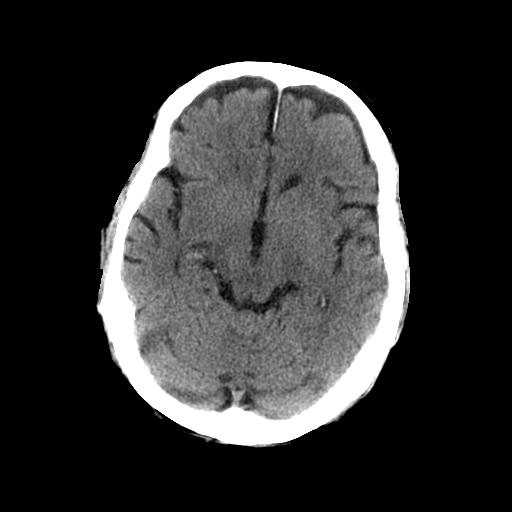
[im 12/32  bone]
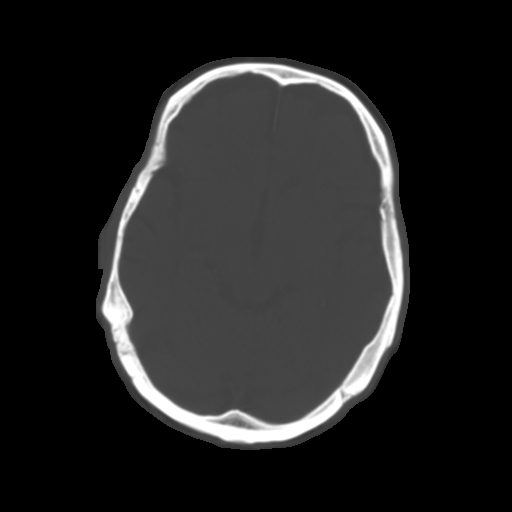
[im 14/32  brain]
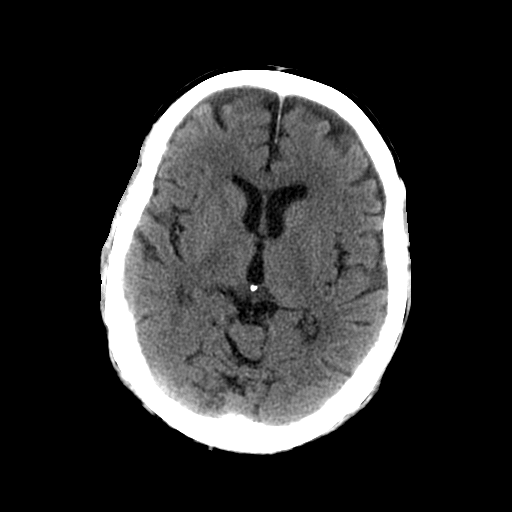
[im 16/32  brain]
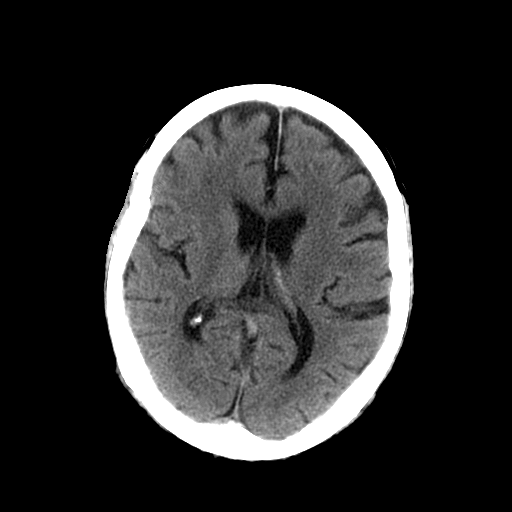
[im 18/32  brain]
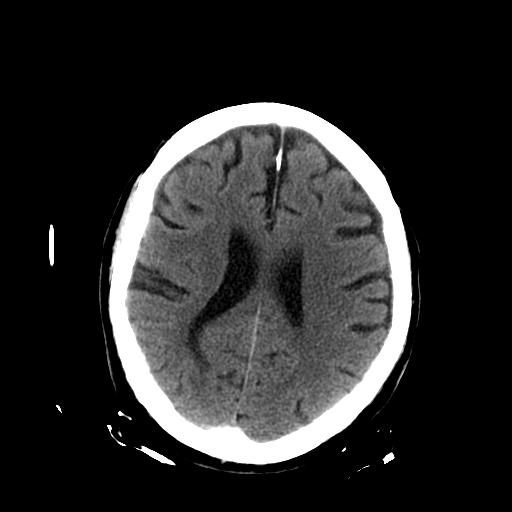
[im 20/32  brain]
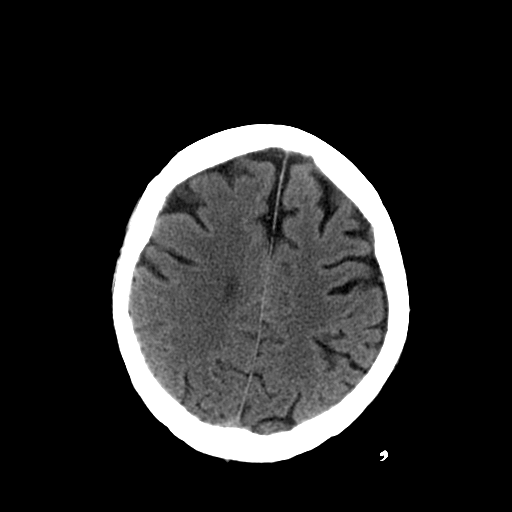
[im 20/32  bone]
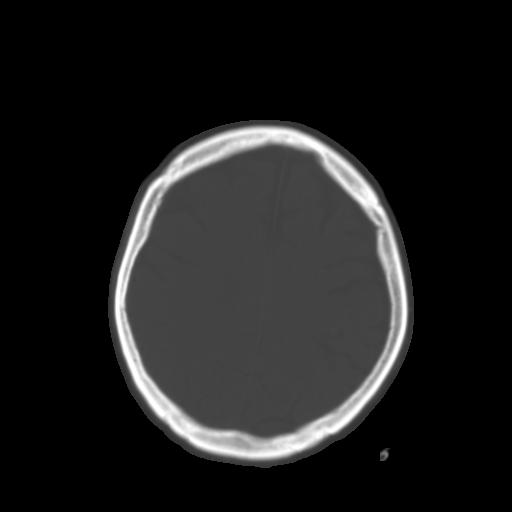
[im 23/32  brain]
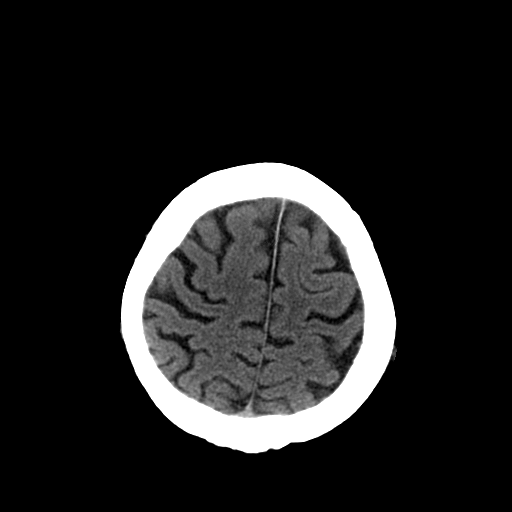
[im 25/32  brain]
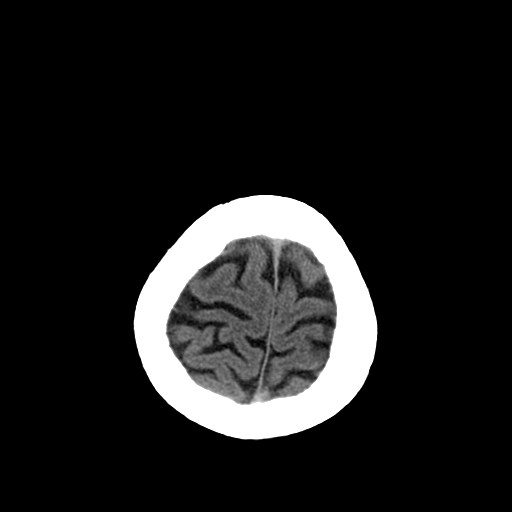
[im 27/32  brain]
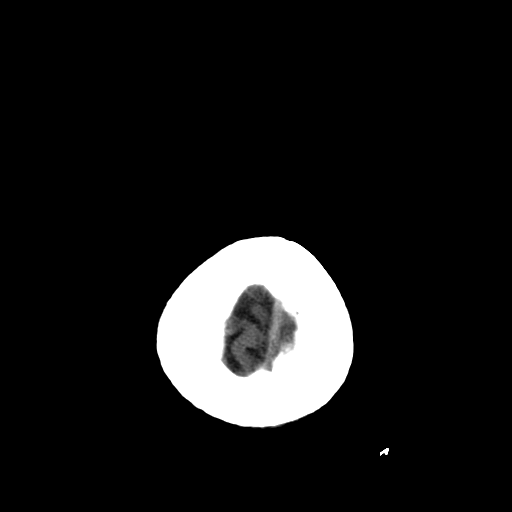
[im 29/32  brain]
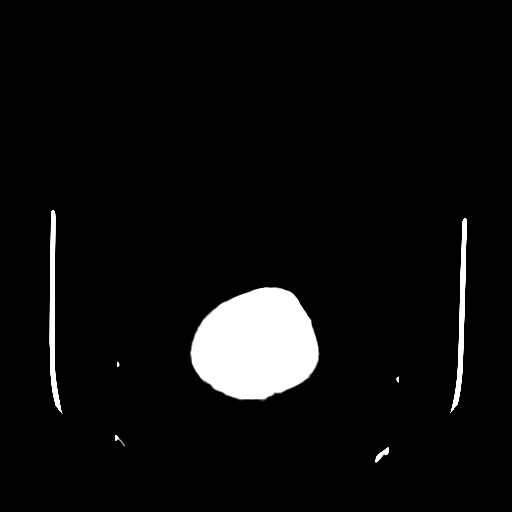
[im 29/32  bone]
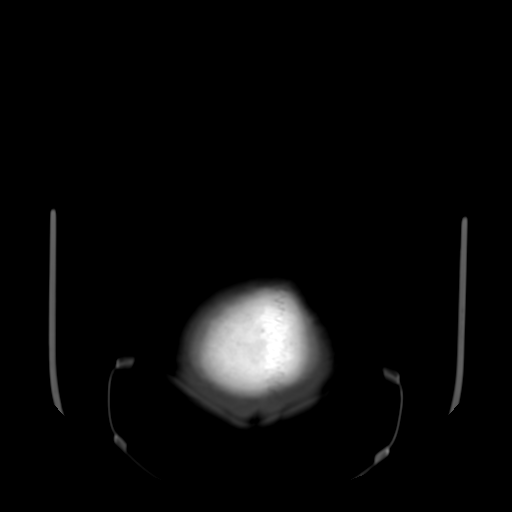

[13 of 30 positions shown; findings below may reference images not displayed]

FINDINGS: Age appropriate atrophy.  Mild chronic microvascular
ischemia in the white matter.  No acute infarct.  Negative for
hemorrhage or mass lesion.  The calvarium is intact.
IMPRESSION: No acute intracranial abnormality.

## 2013-03-17 ENCOUNTER — Other Ambulatory Visit: Payer: Self-pay | Admitting: Internal Medicine

## 2013-03-31 ENCOUNTER — Telehealth: Payer: Self-pay

## 2013-03-31 NOTE — Telephone Encounter (Signed)
Called in medication.

## 2013-04-28 ENCOUNTER — Ambulatory Visit (INDEPENDENT_AMBULATORY_CARE_PROVIDER_SITE_OTHER): Payer: Medicare Other | Admitting: Internal Medicine

## 2013-04-28 ENCOUNTER — Encounter: Payer: Self-pay | Admitting: Internal Medicine

## 2013-04-28 ENCOUNTER — Other Ambulatory Visit (INDEPENDENT_AMBULATORY_CARE_PROVIDER_SITE_OTHER): Payer: Medicare Other

## 2013-04-28 VITALS — BP 140/62 | HR 76 | Temp 97.6°F | Resp 16 | Wt 207.0 lb

## 2013-04-28 DIAGNOSIS — I10 Essential (primary) hypertension: Secondary | ICD-10-CM

## 2013-04-28 DIAGNOSIS — M545 Low back pain, unspecified: Secondary | ICD-10-CM

## 2013-04-28 DIAGNOSIS — R7309 Other abnormal glucose: Secondary | ICD-10-CM

## 2013-04-28 DIAGNOSIS — E785 Hyperlipidemia, unspecified: Secondary | ICD-10-CM

## 2013-04-28 DIAGNOSIS — R739 Hyperglycemia, unspecified: Secondary | ICD-10-CM | POA: Insufficient documentation

## 2013-04-28 DIAGNOSIS — E538 Deficiency of other specified B group vitamins: Secondary | ICD-10-CM

## 2013-04-28 DIAGNOSIS — B356 Tinea cruris: Secondary | ICD-10-CM

## 2013-04-28 DIAGNOSIS — I6529 Occlusion and stenosis of unspecified carotid artery: Secondary | ICD-10-CM

## 2013-04-28 DIAGNOSIS — N529 Male erectile dysfunction, unspecified: Secondary | ICD-10-CM

## 2013-04-28 DIAGNOSIS — M542 Cervicalgia: Secondary | ICD-10-CM

## 2013-04-28 DIAGNOSIS — I6521 Occlusion and stenosis of right carotid artery: Secondary | ICD-10-CM

## 2013-04-28 LAB — BASIC METABOLIC PANEL
BUN: 18 mg/dL (ref 6–23)
CO2: 28 mEq/L (ref 19–32)
Chloride: 104 mEq/L (ref 96–112)
Creatinine, Ser: 1 mg/dL (ref 0.4–1.5)
Glucose, Bld: 126 mg/dL — ABNORMAL HIGH (ref 70–99)

## 2013-04-28 LAB — LDL CHOLESTEROL, DIRECT: Direct LDL: 160.2 mg/dL

## 2013-04-28 LAB — LIPID PANEL
Cholesterol: 212 mg/dL — ABNORMAL HIGH (ref 0–200)
HDL: 35.7 mg/dL — ABNORMAL LOW (ref >39.00)
Total CHOL/HDL Ratio: 6
VLDL: 46.6 mg/dL — ABNORMAL HIGH (ref 0.0–40.0)

## 2013-04-28 LAB — VITAMIN B12: Vitamin B-12: 702 pg/mL (ref 211–911)

## 2013-04-28 NOTE — Assessment & Plan Note (Signed)
Continue with current prescription therapy as reflected on the Med list.  

## 2013-04-28 NOTE — Assessment & Plan Note (Signed)
Chronic pain 

## 2013-04-28 NOTE — Assessment & Plan Note (Signed)
Continue with current prn prescription therapy as reflected on the Med list.  

## 2013-04-28 NOTE — Assessment & Plan Note (Signed)
A1c

## 2013-04-28 NOTE — Progress Notes (Signed)
Pre visit review using our clinic review tool, if applicable. No additional management support is needed unless otherwise documented below in the visit note. 
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
## 2013-05-11 ENCOUNTER — Encounter: Payer: Self-pay | Admitting: Internal Medicine

## 2013-05-11 ENCOUNTER — Ambulatory Visit (INDEPENDENT_AMBULATORY_CARE_PROVIDER_SITE_OTHER): Payer: Medicare Other | Admitting: Internal Medicine

## 2013-05-11 VITALS — BP 142/72 | HR 88 | Temp 98.6°F | Wt 208.0 lb

## 2013-05-11 DIAGNOSIS — R21 Rash and other nonspecific skin eruption: Secondary | ICD-10-CM | POA: Insufficient documentation

## 2013-05-11 DIAGNOSIS — L538 Other specified erythematous conditions: Secondary | ICD-10-CM

## 2013-05-11 DIAGNOSIS — I10 Essential (primary) hypertension: Secondary | ICD-10-CM

## 2013-05-11 MED ORDER — KETOCONAZOLE 200 MG PO TABS: 200.0000 mg | ORAL_TABLET | Freq: Every day | ORAL | Status: DC

## 2013-05-11 NOTE — Assessment & Plan Note (Signed)
stable overall by history and exam, recent data reviewed with pt, and pt to continue medical treatment as before,  to f/u any worsening symptoms or concerns BP Readings from Last 3 Encounters:  05/11/13 142/72  04/28/13 140/62  01/25/13 130/70

## 2013-05-11 NOTE — Progress Notes (Signed)
Subjective:    Patient ID: Richard Burgess, male    DOB: June 28, 1928, 79 y.o.   MRN: [id number]  HPI  Here with 1 wk worsening itchy red rash to scrotum and bilat medial upper legs/thighs.  No prior hx of same though mentions had some rash to right knee a few yrs ago, and wonders if somehow could be related.  No pain, swelling, drainage, fever.  Denies urinary symptoms such as dysuria, frequency, urgency, flank pain, hematuria or n/v, fever, chills.   Past Medical History  Diagnosis Date  . Diverticulosis of colon   . Hypertention, malignant, with acute intensive management   . PVD (peripheral vascular disease)     Bilateral carotid  . Hx-TIA (transient ischemic attack)   . ED (erectile dysfunction)   . Glucose intolerance (impaired glucose tolerance)   . Hyperlipidemia   . GERD (gastroesophageal reflux disease)   . Allergy   . Osteoarthritis   . Low back pain   . History of pancreatitis   . BPH (benign prostatic hypertrophy)   . Tubular adenoma of colon 08/1991    One with carcinoma IN [facility] 1993, multiple adenomatous  . Hypertension    Past Surgical History  Procedure Laterality Date  . Appendectomy    . Cholecystectomy    . Basal cell carcinoma excision    . Lumbar spine surgery      X4    reports that he has quit smoking. He has never used smokeless tobacco. He reports that he drinks about 3.5 ounces of alcohol per week. He reports that he does not use illicit drugs. family history includes Colon cancer in his sister; Dementia in his father; Diabetes in his mother; Peripheral vascular disease in his brother. Allergies  Allergen Reactions  . Oxycodone     crazy  . Pravastatin Sodium     REACTION: bad dreams  . Rosuvastatin     REACTION: palpitations  . Statins     REACTION: aches   Current Outpatient Prescriptions on File Prior to Visit  Medication Sig Dispense Refill  . amLODipine (NORVASC) 5 MG tablet Take 1 tablet (5 mg total) by mouth daily.  90 tablet  1  .  aspirin 81 MG tablet Take 81 mg by mouth daily.        . Cholecalciferol (SM VITAMIN D3) 1000 UNITS tablet Take 1,000 Units by mouth daily.        . colesevelam (WELCHOL) 625 MG tablet Take 2 tablets (1,250 mg total) by mouth 2 (two) times daily with a meal.  120 tablet  11  . Cyanocobalamin (VITAMIN B-12) 1000 MCG SUBL Place 1 tablet (1,000 mcg total) under the tongue daily.  100 tablet  3  . ketoconazole (NIZORAL) 2 % cream Apply topically 2 (two) times daily. Use x 1-2 months  45 g  1  . lisinopril (PRINIVIL,ZESTRIL) 20 MG tablet Take 1 tablet (20 mg total) by mouth daily.  90 tablet  3  . omeprazole (PRILOSEC) 40 MG capsule Take 1 capsule (40 mg total) by mouth daily.  90 capsule  1  . sildenafil (VIAGRA) 100 MG tablet Take 100 mg by mouth as needed.      . traMADol (ULTRAM) 50 MG tablet TAKE 1 TABLET (50 MG TOTAL) BY MOUTH EVERY 8 (EIGHT) HOURS AS NEEDED FOR PAIN.  90 tablet  1  . traMADol (ULTRAM) 50 MG tablet TAKE 1 TABLET (50 MG TOTAL) BY MOUTH EVERY 8 (EIGHT) HOURS AS NEEDED FOR PAIN.  90 tablet  1  . triamcinolone ointment (KENALOG) 0.5 % Apply topically 2 (two) times daily.  60 g  2  . zolpidem (AMBIEN) 5 MG tablet TAKE 1 TABLET AT BEDTIME AS NEEDED FOR SLEEP  30 tablet  3  . fluticasone (FLONASE) 50 MCG/ACT nasal spray Place 2 sprays into the nose daily.  16 g  6   Current Facility-Administered Medications on File Prior to Visit  Medication Dose Route Frequency Provider Last Rate Last Dose  . methylPREDNISolone acetate (DEPO-MEDROL) injection 80 mg  80 mg Intra-articular Once Tresa Garter, MD        Review of Systems  All otherwise neg per pt      Objective:   Physical Exam BP 142/72  Pulse 88  Temp(Src) 98.6 F (37 C) (Oral)  Wt 208 lb (94.348 kg)  SpO2 96% VS noted,  Constitutional: Pt appears well-developed and well-nourished.  HENT: Head: NCAT.  Right Ear: External ear normal.  Left Ear: External ear normal.  Eyes: Conjunctivae and EOM are normal. Pupils  are equal, round, and reactive to light.  Neck: Normal range of motion. Neck supple.  Cardiovascular: Normal rate and regular rhythm.   Pulmonary/Chest: Effort normal and breath sounds normal.  Abd:  Soft, NT, non-distended, + BS Neurological: Pt is alert. Not confused  Skin: with large area to entire genital as well as inguinal and medial thigh nontender erythema with discrete edges c/w fungal/ringworm like rash Psychiatric: Pt behavior is normal. Thought content normal.     Assessment & Plan:

## 2013-05-11 NOTE — Assessment & Plan Note (Signed)
Very large, for nizoral 200 qd x 7 days,  to f/u any worsening symptoms or concerns

## 2013-05-11 NOTE — Patient Instructions (Addendum)
Please take all new medication as prescribed Please continue all other medications as before, and refills have been done if requested. Please have the pharmacy call with any other refills you may need.  Please keep your appointments with your specialists as you may have planned  You can also use OTC Lotrimin for the rash as well  Please call for Dermatology referral if not better in 2-3 days

## 2013-05-11 NOTE — Progress Notes (Signed)
Pre-visit discussion using our clinic review tool. No additional management support is needed unless otherwise documented below in the visit note.  

## 2013-05-15 ENCOUNTER — Other Ambulatory Visit: Payer: Self-pay | Admitting: Internal Medicine

## 2013-05-20 ENCOUNTER — Encounter: Payer: Self-pay | Admitting: Surgery

## 2013-05-23 ENCOUNTER — Encounter: Payer: Self-pay | Admitting: Surgery

## 2013-05-23 ENCOUNTER — Ambulatory Visit (INDEPENDENT_AMBULATORY_CARE_PROVIDER_SITE_OTHER): Payer: Medicare Other | Admitting: Surgery

## 2013-05-23 ENCOUNTER — Ambulatory Visit (HOSPITAL_COMMUNITY)
Admission: RE | Admit: 2013-05-23 | Discharge: 2013-05-23 | Disposition: A | Discharge status: Home / Self Care | Payer: Medicare Other | Source: Ambulatory Visit | Attending: Surgery | Admitting: Surgery

## 2013-05-23 ENCOUNTER — Other Ambulatory Visit (HOSPITAL_COMMUNITY): Payer: Medicare Other

## 2013-05-23 DIAGNOSIS — I658 Occlusion and stenosis of other precerebral arteries: Secondary | ICD-10-CM | POA: Insufficient documentation

## 2013-05-23 DIAGNOSIS — I6529 Occlusion and stenosis of unspecified carotid artery: Secondary | ICD-10-CM

## 2013-05-23 NOTE — Addendum Note (Signed)
Addended by: Sharee Pimple on: 05/23/2013 03:29 PM   Modules accepted: Orders

## 2013-05-23 NOTE — Progress Notes (Signed)
Patient name: Richard Burgess MRN: [id number] DOB: 1929/03/16 Sex: male   Referred by: Dr. Posey Rea  Reason for referral:  Chief Complaint  Patient presents with  . Carotid    carotid stenosis  . New Evaluation    HISTORY OF PRESENT ILLNESS: The patient comes in today for evaluation of carotid disease.  He has previously had multiple imaging studies.  The most recent of which shows 60-79% stenosis on the right with minimal stenosis on the left.  The patient denies symptoms.  Specifically, he denies weakness in either extremity, he denies slurred speech, he denies amaurosis fugax.  The patient does suffer from hypertension which is treated with an ACE inhibitor.  His hypercholesterolemia is treated with WelChol.  He is a former smoker but does not currently smoke.  Past Medical History  Diagnosis Date  . Diverticulosis of colon   . Hypertention, malignant, with acute intensive management   . PVD (peripheral vascular disease)     Bilateral carotid  . Hx-TIA (transient ischemic attack)   . ED (erectile dysfunction)   . Glucose intolerance (impaired glucose tolerance)   . Hyperlipidemia   . GERD (gastroesophageal reflux disease)   . Allergy   . Osteoarthritis   . Low back pain   . History of pancreatitis   . BPH (benign prostatic hypertrophy)   . Tubular adenoma of colon 08/1991    One with carcinoma IN [facility] 1993, multiple adenomatous  . Hypertension     Past Surgical History  Procedure Laterality Date  . Appendectomy    . Cholecystectomy    . Basal cell carcinoma excision    . Lumbar spine surgery      X4    History   Social History  . Marital Status: Married    Spouse Name: N/A    Number of Children: 3  . Years of Education: N/A   Occupational History  . Retired-Construction    Social History Main Topics  . Smoking status: Former Games developer  . Smokeless tobacco: Never Used     Comment: Daily caffeine use, regular exercise  . Alcohol Use: 3.5 - 7 oz/week      7-14 drink(s) per week  . Drug Use: No  . Sexual Activity: Yes   Other Topics Concern  . Not on file   Social History Narrative  . No narrative on file    Family History  Problem Relation Age of Onset  . Dementia Father   . Colon cancer Sister   . Diabetes Mother   . Peripheral vascular disease Brother     Allergies as of 05/23/2013 - Review Complete 05/23/2013  Allergen Reaction Noted  . Oxycodone  01/21/2012  . Pravastatin sodium  08/23/2010  . Rosuvastatin  08/23/2010  . Statins  04/03/2009    Current Outpatient Prescriptions on File Prior to Visit  Medication Sig Dispense Refill  . amLODipine (NORVASC) 5 MG tablet Take 1 tablet (5 mg total) by mouth daily.  90 tablet  1  . aspirin 81 MG tablet Take 81 mg by mouth daily.        . Cholecalciferol (SM VITAMIN D3) 1000 UNITS tablet Take 1,000 Units by mouth daily.        . colesevelam (WELCHOL) 625 MG tablet Take 2 tablets (1,250 mg total) by mouth 2 (two) times daily with a meal.  120 tablet  11  . Cyanocobalamin (VITAMIN B-12) 1000 MCG SUBL Place 1 tablet (1,000 mcg total) under the tongue daily.  100 tablet  3  . ketoconazole (NIZORAL) 2 % cream Apply topically 2 (two) times daily. Use x 1-2 months  45 g  1  . ketoconazole (NIZORAL) 200 MG tablet Take 1 tablet (200 mg total) by mouth daily.  7 tablet  0  . lisinopril (PRINIVIL,ZESTRIL) 20 MG tablet Take 1 tablet (20 mg total) by mouth daily.  90 tablet  3  . omeprazole (PRILOSEC) 40 MG capsule Take 1 capsule (40 mg total) by mouth daily.  90 capsule  1  . sildenafil (VIAGRA) 100 MG tablet Take 100 mg by mouth as needed.      . traMADol (ULTRAM) 50 MG tablet TAKE 1 TABLET (50 MG TOTAL) BY MOUTH EVERY 8 (EIGHT) HOURS AS NEEDED FOR PAIN.  90 tablet  1  . traMADol (ULTRAM) 50 MG tablet TAKE 1 TABLET (50 MG TOTAL) BY MOUTH EVERY 8 (EIGHT) HOURS AS NEEDED FOR PAIN.  90 tablet  1  . triamcinolone ointment (KENALOG) 0.5 % Apply topically 2 (two) times daily.  60 g  2  .  zolpidem (AMBIEN) 5 MG tablet TAKE 1 TABLET BY MOUTH AT BEDTIME AS NEEDED FOR SLEEP  30 tablet  3  . fluticasone (FLONASE) 50 MCG/ACT nasal spray Place 2 sprays into the nose daily.  16 g  6   Current Facility-Administered Medications on File Prior to Visit  Medication Dose Route Frequency Provider Last Rate Last Dose  . methylPREDNISolone acetate (DEPO-MEDROL) injection 80 mg  80 mg Intra-articular Once Tresa Garter, MD         REVIEW OF SYSTEMS: Cardiovascular: No chest pain, chest pressure, palpitations, orthopnea, or dyspnea on exertion. No claudication or rest pain,  No history of DVT or phlebitis. Pulmonary: No productive cough, asthma or wheezing. Neurologic: No weakness, paresthesias, aphasia, or amaurosis. No dizziness. Hematologic: No bleeding problems or clotting disorders. Musculoskeletal: No joint pain or joint swelling. Gastrointestinal: No blood in stool or hematemesis Genitourinary: No dysuria or hematuria. Psychiatric:: No history of major depression. Integumentary: No rashes or ulcers. Constitutional: No fever or chills.  PHYSICAL EXAMINATION: General: The patient appears their stated age.  Vital signs are BP 140/70  Pulse 66  Resp 16  Ht 5' 10.5" (1.791 m)  Wt 206 lb (93.441 kg)  BMI 29.13 kg/m2  SpO2 99% HEENT:  No gross abnormalities Pulmonary: Respirations are non-labored Abdomen: Soft and non-tender .  No aneurysm appreciated Musculoskeletal: There are no major deformities.   Neurologic: No focal weakness or paresthesias are detected, Skin: There are no ulcer or rashes noted. Psychiatric: The patient has normal affect. Cardiovascular: There is a regular rate and rhythm without significant murmur appreciated. No carotid bruits.  Diagnostic Studies: Carotid duplex was ordered and reviewed.  This shows 40-59% stenosis on the right and less than 40% stenosis on the left  Outside Studies/Documentation Historical records were reviewed.  They showed  60-79% stenosis on the right and less than 40% on the left  Medication Changes: None  Assessment:  Asymptomatic bilateral carotid stenosis, right greater than left Plan: I told the patient that as long as he remains asymptomatic, and has stenosis less than 80%, and he would be better treated with medical therapy, consisting of blood pressure regulation, blood glucose control and a statin for hypercholesterolemia.  In addition he will need at least antiplatelet therapy with aspirin.  Based on the degree of stenosis detected today, I recommended repeat ultrasound in one year.  He will contact me sooner if he develops neurologic  symptoms.     Jorge Ny, M.D. [facility] Office: [phone] Pager:  [phone]

## 2013-06-21 ENCOUNTER — Other Ambulatory Visit: Payer: Self-pay | Admitting: *Deleted

## 2013-06-21 ENCOUNTER — Telehealth: Payer: Self-pay | Admitting: *Deleted

## 2013-06-21 MED ORDER — TRIAMCINOLONE ACETONIDE 0.5 % EX OINT: TOPICAL_OINTMENT | Freq: Two times a day (BID) | CUTANEOUS | Status: DC

## 2013-06-21 MED ORDER — AMLODIPINE BESYLATE 5 MG PO TABS: 5.0000 mg | ORAL_TABLET | Freq: Every day | ORAL | Status: DC

## 2013-06-21 MED ORDER — COLESEVELAM HCL 625 MG PO TABS: 1250.0000 mg | ORAL_TABLET | Freq: Two times a day (BID) | ORAL | Status: DC

## 2013-06-21 MED ORDER — OMEPRAZOLE 40 MG PO CPDR: 40.0000 mg | DELAYED_RELEASE_CAPSULE | Freq: Every day | ORAL | Status: DC

## 2013-06-21 MED ORDER — SILDENAFIL CITRATE 100 MG PO TABS: 100.0000 mg | ORAL_TABLET | ORAL | Status: DC | PRN

## 2013-06-21 MED ORDER — LISINOPRIL 20 MG PO TABS
20.0000 mg | ORAL_TABLET | Freq: Every day | ORAL | Status: DC
Start: 2013-06-21 — End: 2013-10-24

## 2013-06-21 NOTE — Telephone Encounter (Signed)
Received note in mail requesting all meds be sent to [facility]. I sent all maintenance meds and informed pt's wife, Everlene Farrier.

## 2013-07-06 ENCOUNTER — Other Ambulatory Visit: Payer: Self-pay | Admitting: *Deleted

## 2013-07-06 NOTE — Telephone Encounter (Signed)
Requesting rx be sent to [facility].

## 2013-07-12 ENCOUNTER — Telehealth: Payer: Self-pay | Admitting: Internal Medicine

## 2013-07-12 ENCOUNTER — Other Ambulatory Visit: Payer: Self-pay | Admitting: Internal Medicine

## 2013-07-12 MED ORDER — ZOLPIDEM TARTRATE 10 MG PO TABS: 5.0000 mg | ORAL_TABLET | Freq: Every evening | ORAL | Status: DC | PRN

## 2013-07-12 MED ORDER — ZOLPIDEM TARTRATE 10 MG PO TABS: 5.0000 mg | ORAL_TABLET | Freq: Every evening | ORAL | Status: AC | PRN

## 2013-07-12 NOTE — Telephone Encounter (Signed)
Ok Thx 

## 2013-07-12 NOTE — Telephone Encounter (Signed)
Patient's wife is calling to request that the patient's zolpidem (AMBIEN) 5 MG tablet be changed to 10mg  and sent to [facility]. She says that he can receive 10mg  tablets free through them. Please advise.

## 2013-07-29 ENCOUNTER — Ambulatory Visit: Payer: Medicare Other | Admitting: Internal Medicine

## 2013-08-01 ENCOUNTER — Encounter: Payer: Self-pay | Admitting: Internal Medicine

## 2013-08-01 ENCOUNTER — Other Ambulatory Visit (INDEPENDENT_AMBULATORY_CARE_PROVIDER_SITE_OTHER): Payer: Commercial Managed Care - HMO

## 2013-08-01 ENCOUNTER — Ambulatory Visit (INDEPENDENT_AMBULATORY_CARE_PROVIDER_SITE_OTHER): Payer: Medicare HMO | Admitting: Internal Medicine

## 2013-08-01 VITALS — BP 130/60 | HR 72 | Temp 97.4°F | Resp 16 | Wt 206.0 lb

## 2013-08-01 DIAGNOSIS — E785 Hyperlipidemia, unspecified: Secondary | ICD-10-CM

## 2013-08-01 DIAGNOSIS — M545 Low back pain, unspecified: Secondary | ICD-10-CM

## 2013-08-01 DIAGNOSIS — R739 Hyperglycemia, unspecified: Secondary | ICD-10-CM

## 2013-08-01 DIAGNOSIS — L258 Unspecified contact dermatitis due to other agents: Secondary | ICD-10-CM

## 2013-08-01 DIAGNOSIS — L853 Xerosis cutis: Secondary | ICD-10-CM | POA: Insufficient documentation

## 2013-08-01 DIAGNOSIS — R7309 Other abnormal glucose: Secondary | ICD-10-CM

## 2013-08-01 DIAGNOSIS — Z23 Encounter for immunization: Secondary | ICD-10-CM

## 2013-08-01 LAB — BASIC METABOLIC PANEL
BUN: 23 mg/dL (ref 6–23)
CO2: 27 mEq/L (ref 19–32)
Calcium: 9.9 mg/dL (ref 8.4–10.5)
Chloride: 106 mEq/L (ref 96–112)
Creatinine, Ser: 1 mg/dL (ref 0.4–1.5)
GFR: 74.2 mL/min (ref >60.00)
Glucose, Bld: 147 mg/dL — ABNORMAL HIGH (ref 70–99)
POTASSIUM: 4.9 meq/L (ref 3.5–5.1)
Sodium: 139 mEq/L (ref 135–145)

## 2013-08-01 LAB — LIPID PANEL
Cholesterol: 224 mg/dL — ABNORMAL HIGH (ref 0–200)
HDL: 40.6 mg/dL (ref >39.00)
TRIGLYCERIDES: 183 mg/dL — AB (ref 0.0–149.0)
Total CHOL/HDL Ratio: 6
VLDL: 36.6 mg/dL (ref 0.0–40.0)

## 2013-08-01 LAB — HEPATIC FUNCTION PANEL
ALT: 22 U/L (ref 0–53)
AST: 20 U/L (ref 0–37)
Albumin: 4.2 g/dL (ref 3.5–5.2)
Alkaline Phosphatase: 59 U/L (ref 39–117)
BILIRUBIN TOTAL: 1 mg/dL (ref 0.3–1.2)
Bilirubin, Direct: 0.1 mg/dL (ref 0.0–0.3)
Total Protein: 7.2 g/dL (ref 6.0–8.3)

## 2013-08-01 LAB — HEMOGLOBIN A1C: HEMOGLOBIN A1C: 6.5 % (ref 4.6–6.5)

## 2013-08-01 LAB — LDL CHOLESTEROL, DIRECT: Direct LDL: 164.5 mg/dL

## 2013-08-01 MED ORDER — TRIAMCINOLONE ACETONIDE 0.5 % EX OINT: 1.0000 "application " | TOPICAL_OINTMENT | Freq: Two times a day (BID) | CUTANEOUS | Status: DC
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
## 2013-08-01 NOTE — Progress Notes (Deleted)
Pre visit review using our clinic review tool, if applicable. No additional management support is needed unless otherwise documented below in the visit note. 

## 2013-08-01 NOTE — Assessment & Plan Note (Signed)
Continue with current prescription therapy as reflected on the Med list.  

## 2013-08-01 NOTE — Assessment & Plan Note (Signed)
Statin intolerant 2/15 on red rice yeast

## 2013-08-01 NOTE — Assessment & Plan Note (Signed)
B ankles 2/15 Triamc oint bid Discussed

## 2013-08-02 ENCOUNTER — Telehealth: Payer: Self-pay | Admitting: *Deleted

## 2013-08-02 NOTE — Telephone Encounter (Signed)
Patient phoned inquiring about med not being sent to local pharmacy---it was sent to [facility].  Offered to cancel [facility] script & reorder at local, but patient stated it was okay to leave it as it was with [facility].

## 2013-08-16 ENCOUNTER — Other Ambulatory Visit: Payer: Self-pay | Admitting: Internal Medicine

## 2013-08-22 NOTE — Telephone Encounter (Signed)
Called refill into pharmacy spoke with anna/pharmacist gave md approval.../lmb

## 2013-10-24 ENCOUNTER — Other Ambulatory Visit: Payer: Self-pay | Admitting: *Deleted

## 2013-10-24 ENCOUNTER — Ambulatory Visit (INDEPENDENT_AMBULATORY_CARE_PROVIDER_SITE_OTHER): Payer: Commercial Managed Care - HMO | Admitting: Internal Medicine

## 2013-10-24 ENCOUNTER — Encounter: Payer: Self-pay | Admitting: Internal Medicine

## 2013-10-24 VITALS — BP 140/78 | HR 72 | Temp 97.3°F | Resp 16 | Wt 202.0 lb

## 2013-10-24 DIAGNOSIS — Z8679 Personal history of other diseases of the circulatory system: Secondary | ICD-10-CM

## 2013-10-24 DIAGNOSIS — I10 Essential (primary) hypertension: Secondary | ICD-10-CM

## 2013-10-24 DIAGNOSIS — Z0000 Encounter for general adult medical examination without abnormal findings: Secondary | ICD-10-CM | POA: Insufficient documentation

## 2013-10-24 DIAGNOSIS — R6889 Other general symptoms and signs: Secondary | ICD-10-CM

## 2013-10-24 DIAGNOSIS — N320 Bladder-neck obstruction: Secondary | ICD-10-CM

## 2013-10-24 MED ORDER — LOSARTAN POTASSIUM 100 MG PO TABS: 100.0000 mg | ORAL_TABLET | Freq: Every day | ORAL | Status: DC

## 2013-10-24 NOTE — Assessment & Plan Note (Signed)
Here for medicare wellness/physical  Diet: heart healthy  Physical activity: not sedentary  Depression/mood screen: negative  Hearing: intact to whispered voice  Visual acuity: grossly normal, performs annual eye exam  ADLs: capable  Fall risk: none  Home safety: good  Cognitive evaluation: intact to orientation, naming, recall and repetition  EOL planning: adv directives, full code/ I agree  I have personally reviewed and have noted  1. The patient's medical and social history  2. Their use of alcohol, tobacco or illicit drugs  3. Their current medications and supplements  4. The patient's functional ability including ADL's, fall risks, home safety risks and hearing or visual impairment.  5. Diet and physical activities  6. Evidence for depression or mood disorders    Today patient counseled on age appropriate routine health concerns for screening and prevention, each reviewed and up to date or declined. Immunizations reviewed and up to date or declined. Labs ordered and reviewed. Risk factors for depression reviewed and negative. Hearing function and visual acuity are intact. ADLs screened and addressed as needed. Functional ability and level of safety reviewed and appropriate. Education, counseling and referrals performed based on assessed risks today. Patient provided with a copy of personalized plan for preventive services.   He gets a lot of his care at [facility]

## 2013-10-24 NOTE — Assessment & Plan Note (Signed)
D/c Lotensin  Start Losartan

## 2013-10-24 NOTE — Progress Notes (Signed)
Pre visit review using our clinic review tool, if applicable. No additional management support is needed unless otherwise documented below in the visit note. 

## 2013-10-24 NOTE — Assessment & Plan Note (Signed)
No relapse 

## 2013-10-24 NOTE — Assessment & Plan Note (Signed)
5/15  x2 mo ?due to ACE D/c ACE
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
## 2013-10-24 NOTE — Patient Instructions (Signed)
Stop Lisinopril, start Losartan 1 a day

## 2013-10-25 ENCOUNTER — Telehealth: Payer: Self-pay | Admitting: Internal Medicine

## 2013-10-25 NOTE — Telephone Encounter (Signed)
Relevant patient education assigned to patient using Emmi. ° °

## 2013-10-28 ENCOUNTER — Ambulatory Visit: Payer: Medicare HMO | Admitting: Internal Medicine

## 2013-11-01 ENCOUNTER — Other Ambulatory Visit: Payer: Self-pay | Admitting: *Deleted

## 2013-11-01 NOTE — Telephone Encounter (Signed)
Pt called requesting Zolpidem refill be sent to [facility]. Zolpidem no longer on active med list.  Please advise

## 2013-11-01 NOTE — Telephone Encounter (Signed)
Ok Thx 

## 2013-11-03 ENCOUNTER — Telehealth: Payer: Self-pay | Admitting: Internal Medicine

## 2013-11-03 MED ORDER — ZOLPIDEM TARTRATE 5 MG PO TABS: 5.0000 mg | ORAL_TABLET | Freq: Every evening | ORAL | Status: DC | PRN

## 2013-11-03 NOTE — Telephone Encounter (Signed)
Patient's Zolpidem rx is requiring a prior auth. Patient's wife states that she spoke with a representative from [facility] that informed her that they had sent [place] a PA request yesterday. Please advise.

## 2013-11-10 NOTE — Telephone Encounter (Signed)
Zolpidem PA is approved until 11/10/2014. Pt's wife informed.

## 2013-11-18 ENCOUNTER — Encounter: Payer: Self-pay | Admitting: Internal Medicine

## 2014-01-12 ENCOUNTER — Telehealth: Payer: Self-pay | Admitting: *Deleted

## 2014-01-12 NOTE — Telephone Encounter (Signed)
OK to fill this prescription #90 with additional refills x1 Thank you!

## 2014-01-12 NOTE — Telephone Encounter (Signed)
Rf req for Zolpidem 90 day supply. Last OV 10/2013. Ok to Rf?

## 2014-01-13 MED ORDER — ZOLPIDEM TARTRATE 5 MG PO TABS: 5.0000 mg | ORAL_TABLET | Freq: Every evening | ORAL | Status: DC | PRN

## 2014-01-13 NOTE — Telephone Encounter (Signed)
Faxed script back to [place]...Johny Chess

## 2014-01-27 ENCOUNTER — Encounter: Payer: Self-pay | Admitting: Internal Medicine

## 2014-01-27 ENCOUNTER — Ambulatory Visit (INDEPENDENT_AMBULATORY_CARE_PROVIDER_SITE_OTHER): Payer: Commercial Managed Care - HMO | Admitting: Internal Medicine

## 2014-01-27 ENCOUNTER — Other Ambulatory Visit (INDEPENDENT_AMBULATORY_CARE_PROVIDER_SITE_OTHER): Payer: Commercial Managed Care - HMO

## 2014-01-27 ENCOUNTER — Other Ambulatory Visit: Payer: Commercial Managed Care - HMO

## 2014-01-27 VITALS — BP 140/74 | HR 72 | Temp 97.7°F | Resp 16 | Wt 205.0 lb

## 2014-01-27 DIAGNOSIS — M545 Low back pain, unspecified: Secondary | ICD-10-CM

## 2014-01-27 DIAGNOSIS — E538 Deficiency of other specified B group vitamins: Secondary | ICD-10-CM

## 2014-01-27 DIAGNOSIS — I739 Peripheral vascular disease, unspecified: Secondary | ICD-10-CM

## 2014-01-27 DIAGNOSIS — L570 Actinic keratosis: Secondary | ICD-10-CM

## 2014-01-27 DIAGNOSIS — R209 Unspecified disturbances of skin sensation: Secondary | ICD-10-CM

## 2014-01-27 DIAGNOSIS — R202 Paresthesia of skin: Secondary | ICD-10-CM

## 2014-01-27 LAB — BASIC METABOLIC PANEL
BUN: 17 mg/dL (ref 6–23)
CO2: 28 mEq/L (ref 19–32)
Calcium: 9.6 mg/dL (ref 8.4–10.5)
Chloride: 105 mEq/L (ref 96–112)
Creatinine, Ser: 1 mg/dL (ref 0.4–1.5)
GFR: 75.82 mL/min (ref >60.00)
GLUCOSE: 122 mg/dL — AB (ref 70–99)
POTASSIUM: 5.3 meq/L — AB (ref 3.5–5.1)
Sodium: 140 mEq/L (ref 135–145)

## 2014-01-27 LAB — SEDIMENTATION RATE: Sed Rate: 16 mm/hr (ref 0–22)

## 2014-01-27 LAB — VITAMIN B12: VITAMIN B 12: 552 pg/mL (ref 211–911)

## 2014-01-27 NOTE — Progress Notes (Signed)
Pre visit review using our clinic review tool, if applicable. No additional management support is needed unless otherwise documented below in the visit note. 

## 2014-01-27 NOTE — Assessment & Plan Note (Signed)
Continue with current prescription therapy as reflected on the Med list.  

## 2014-01-27 NOTE — Assessment & Plan Note (Signed)
Continue with current prescription therapy as reflected on the Med list. Labs  
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
## 2014-01-27 NOTE — Patient Instructions (Signed)
Keep the wounds clean. You can wash them with liquid soap and water. Pat dry with gauze or a Kleenex tissue  Before applying antibiotic ointment and a Band-Aid.   You need to report immediately  if  any signs of infection develop.

## 2014-01-27 NOTE — Assessment & Plan Note (Signed)
See Procedure 

## 2014-01-27 NOTE — Assessment & Plan Note (Signed)
8/15 B hands

## 2014-01-28 LAB — TSH: TSH: 2.51 u[IU]/mL (ref 0.35–4.50)

## 2014-01-31 ENCOUNTER — Telehealth: Payer: Self-pay | Admitting: Internal Medicine

## 2014-01-31 ENCOUNTER — Encounter: Payer: Self-pay | Admitting: Nurse Practitioner

## 2014-01-31 ENCOUNTER — Ambulatory Visit (INDEPENDENT_AMBULATORY_CARE_PROVIDER_SITE_OTHER): Payer: Commercial Managed Care - HMO | Admitting: Nurse Practitioner

## 2014-01-31 VITALS — BP 132/68 | HR 76 | Temp 97.9°F | Resp 20 | Wt 203.6 lb

## 2014-01-31 DIAGNOSIS — R443 Hallucinations, unspecified: Secondary | ICD-10-CM

## 2014-01-31 DIAGNOSIS — R51 Headache: Secondary | ICD-10-CM

## 2014-01-31 NOTE — Progress Notes (Signed)
Pre visit review using our clinic review tool, if applicable. No additional management support is needed unless otherwise documented below in the visit note. 

## 2014-01-31 NOTE — Patient Instructions (Signed)
Avoid outdoor heavy work. Adequate fluid intake Restart Tramadol Call clinic if symptoms worsen not resolved or go to ER if worsen if indicated Keep follow up appointment with Dr. Alain Marion   General Headache Without Cause A headache is pain or discomfort felt around the head or neck area. The specific cause of a headache may not be found. There are many causes and types of headaches. A few common ones are:  Tension headaches.  Migraine headaches.  Cluster headaches.  Chronic daily headaches. HOME CARE INSTRUCTIONS   Keep all follow-up appointments with your caregiver or any specialist referral.  Only take over-the-counter or prescription medicines for pain or discomfort as directed by your caregiver.  Lie down in a dark, quiet room when you have a headache.  Keep a headache journal to find out what may trigger your migraine headaches. For example, write down:  What you eat and drink.  How much sleep you get.  Any change to your diet or medicines.  Try massage or other relaxation techniques.  Put ice packs or heat on the head and neck. Use these 3 to 4 times per day for 15 to 20 minutes each time, or as needed.  Limit stress.  Sit up straight, and do not tense your muscles.  Quit smoking if you smoke.  Limit alcohol use.  Decrease the amount of caffeine you drink, or stop drinking caffeine.  Eat and sleep on a regular schedule.  Get 7 to 9 hours of sleep, or as recommended by your caregiver.  Keep lights dim if bright lights bother you and make your headaches worse. SEEK MEDICAL CARE IF:   You have problems with the medicines you were prescribed.  Your medicines are not working.  You have a change from the usual headache.  You have nausea or vomiting. SEEK IMMEDIATE MEDICAL CARE IF:   Your headache becomes severe.  You have a fever.  You have a stiff neck.  You have loss of vision.  You have muscular weakness or loss of muscle control.  You  start losing your balance or have trouble walking.  You feel faint or pass out.  You have severe symptoms that are different from your first symptoms. MAKE SURE YOU:   Understand these instructions.  Will watch your condition.  Will get help right away if you are not doing well or get worse. Document Released: 05/26/2005 Document Revised: 08/18/2011 Document Reviewed: 06/11/2011 [facility] Patient Information 2015 [place], [place]. This information is not intended to replace advice given to you by your health care provider. Make sure you discuss any questions you have with your health care provider.

## 2014-01-31 NOTE — Telephone Encounter (Signed)
Patient Information:  Caller Name: Everlene Farrier  Phone: [phone]  Patient: Richard Burgess, Richard Burgess  Gender: Male  DOB: Aug 15, 1928  Age: 80 Years  PCP: Plotnikov, Alex (Adults only)  Office Follow Up:  Does the office need to follow up with this patient?: No  Instructions For The Office: N/A   Symptoms  Reason For Call & Symptoms: Headaches  throbbing intermittently a few weeks ago, worse for the last 2 days.  Describes headaches as throbbing like, feeling like head is filled up.  Both hands numb for months.  Last night woke confused, hallucinating but this seems to be gone this am.  Has been up, making breakfast, moving about but till this unusual headache.  Reviewed Health History In EMR: Yes  Reviewed Medications In EMR: Yes  Reviewed Allergies In EMR: Yes  Reviewed Surgeries / Procedures: Yes  Date of Onset of Symptoms: 01/29/2014  Guideline(s) Used:  Headache  Confusion - Delirium  Disposition Per Guideline:   See Today in Office  Reason For Disposition Reached:   Transient confusion (i.e., completely resolved)  Advice Given:  Call Back If:  You (i.e., patient, family member) become worse.  Patient Will Follow Care Advice:  YES  Appointment Scheduled:  01/31/2014 13:00:00 Appointment Scheduled Provider:  Steva Colder

## 2014-01-31 NOTE — Progress Notes (Signed)
Subjective:    Patient ID: Richard Burgess, male    DOB: [birth year], 80 y.o.   MRN: [id number]  HPI Patient is seen for complaints of throbbing sensations in the top and back of his head.  He reports he has noticed this intermittently over past few weeks but intensifyed over past 2-3 days.  Reports having "crazy dreams" past few nights and this am reports still seeing his dream when he awakened. Medications reviewed. Denies taking any supplements, herbal remedies.  Has taken Ambien 5mg  po at [facility] for past 6-8 months.  Denies any other neurological symptoms or stressors.    Review of Systems  Constitutional: Negative.  Negative for activity change.  HENT: Negative for congestion, ear pain, postnasal drip, rhinorrhea, sinus pressure and sneezing.   Eyes: Negative.  Negative for photophobia and visual disturbance.  Cardiovascular: Negative.   Neurological: Positive for headaches. Negative for tremors, facial asymmetry, speech difficulty, weakness, light-headedness and numbness.  Psychiatric/Behavioral: Positive for hallucinations and sleep disturbance. Negative for behavioral problems, confusion and agitation. The patient is not nervous/anxious.     Past Medical History  Diagnosis Date  . Diverticulosis of colon   . Hypertention, malignant, with acute intensive management   . PVD (peripheral vascular disease)     Bilateral carotid  . Hx-TIA (transient ischemic attack)   . ED (erectile dysfunction)   . Glucose intolerance (impaired glucose tolerance)   . Hyperlipidemia   . GERD (gastroesophageal reflux disease)   . Allergy   . Osteoarthritis   . Low back pain   . History of pancreatitis   . BPH (benign prostatic hypertrophy)   . Tubular adenoma of colon 08/1991    One with carcinoma IN [facility] 1993, multiple adenomatous  . Hypertension     History   Social History  . Marital Status: Married    Spouse Name: N/A    Number of Children: 3  . Years of Education: N/A   Occupational  History  . Retired-Construction    Social History Main Topics  . Smoking status: Former Research scientist (life sciences)  . Smokeless tobacco: Never Used     Comment: Daily caffeine use, regular exercise  . Alcohol Use: 3.5 - 7.0 oz/week    7-14 drink(s) per week  . Drug Use: No  . Sexual Activity: Yes   Other Topics Concern  . Not on file   Social History Narrative  . No narrative on file    Past Surgical History  Procedure Laterality Date  . Appendectomy    . Cholecystectomy    . Basal cell carcinoma excision    . Lumbar spine surgery      X4    Family History  Problem Relation Age of Onset  . Dementia Father   . Colon cancer Sister   . Diabetes Mother   . Peripheral vascular disease Brother     Allergies  Allergen Reactions  . Oxycodone     crazy  . Pravastatin Sodium     REACTION: bad dreams  . Rosuvastatin     REACTION: palpitations  . Statins     REACTION: aches    Current Outpatient Prescriptions on File Prior to Visit  Medication Sig Dispense Refill  . amLODipine (NORVASC) 5 MG tablet Take 1 tablet (5 mg total) by mouth daily.  90 tablet  3  . aspirin 81 MG tablet Take 81 mg by mouth daily.        . Cyanocobalamin (VITAMIN B-12) 1000 MCG SUBL Place 1 tablet (  1,000 mcg total) under the tongue daily.  100 tablet  3  . losartan (COZAAR) 100 MG tablet Take 1 tablet (100 mg total) by mouth daily.  90 tablet  3  . omeprazole (PRILOSEC) 40 MG capsule Take 1 capsule (40 mg total) by mouth daily.  90 capsule  3  . Red Yeast Rice 600 MG CAPS Take 1 each by mouth 2 (two) times daily.      . sildenafil (VIAGRA) 100 MG tablet Take 1 tablet (100 mg total) by mouth as needed.  90 tablet  3  . traMADol (ULTRAM) 50 MG tablet TAKE 1 TABLET (50 MG TOTAL) BY MOUTH EVERY 8 (EIGHT) HOURS AS NEEDED FOR PAIN.  90 tablet  1  . triamcinolone ointment (KENALOG) 0.5 % Apply 1 application topically 2 (two) times daily.  60 g  0  . zolpidem (AMBIEN) 5 MG tablet Take 1 tablet (5 mg total) by mouth at  bedtime as needed for sleep.  90 tablet  1  . Cholecalciferol (SM VITAMIN D3) 1000 UNITS tablet Take 1,000 Units by mouth daily.        . colesevelam (WELCHOL) 625 MG tablet Take 2 tablets (1,250 mg total) by mouth 2 (two) times daily with a meal.  360 tablet  3  . fluticasone (FLONASE) 50 MCG/ACT nasal spray Place 2 sprays into the nose daily.  16 g  6   Current Facility-Administered Medications on File Prior to Visit  Medication Dose Route Frequency Provider Last Rate Last Dose  . methylPREDNISolone acetate (DEPO-MEDROL) injection 80 mg  80 mg Intra-articular Once Aleksei Plotnikov V, MD        BP 132/68  Pulse 76  Temp(Src) 97.9 F (36.6 C) (Oral)  Resp 20  Wt 203 lb 9.6 oz (92.352 kg)  SpO2 97%        Objective:   Physical Exam  Constitutional: He is oriented to person, place, and time. He appears well-developed and well-nourished. No distress.  HENT:  Head: Normocephalic.  Right Ear: External ear normal.  Left Ear: External ear normal.  Nose: Nose normal.  Mouth/Throat: Oropharynx is clear and moist.  Eyes: Conjunctivae and EOM are normal. Pupils are equal, round, and reactive to light. Right eye exhibits no discharge. Left eye exhibits no discharge.  Neck: Neck supple.  Cardiovascular: Normal rate, regular rhythm and normal heart sounds.   Pulmonary/Chest: Effort normal and breath sounds normal. No respiratory distress. He has no wheezes. He has no rales.  Musculoskeletal: Normal range of motion. He exhibits no edema.  Lymphadenopathy:    He has no cervical adenopathy.  Neurological: He is alert and oriented to person, place, and time. No cranial nerve deficit. He exhibits normal muscle tone. Coordination normal.  Rhomberg negative. Past pointing negative.  Skin: Skin is warm and dry.  Psychiatric: He has a normal mood and affect. His behavior is normal. Judgment and thought content normal.          Assessment & Plan:  1. VOZDGUYQ(034.7) Consulted with Dr.  Alain Marion, Verbal Order by Dr. Alain Marion. Cat scan of head wo contrast.  Patient to take tramadol for headache.  Will consider referral to Neurology if indicated.  - CT Head Wo Contrast; Future  2. Hallucination Continue current medications including Ambien 5 mg q hs as needed.  Keep follow up appointment with Dr. Alain Marion as scheduled.  Patient scheduled to have Carotid doppler in Dec 2015. Call clinic with questions or concerns or if symptoms worsen or not resolved.  Go to ER if indicated.

## 2014-02-01 ENCOUNTER — Encounter: Payer: Self-pay | Admitting: Nurse Practitioner

## 2014-02-16 ENCOUNTER — Telehealth: Payer: Self-pay | Admitting: *Deleted

## 2014-02-16 NOTE — Telephone Encounter (Signed)
OK to fill this prescription (5 mg) with additional refills x2 Thank you!

## 2014-02-16 NOTE — Telephone Encounter (Signed)
Rf req for Zolpidem 10 mg. Ok to Rf?

## 2014-02-17 MED ORDER — ZOLPIDEM TARTRATE 5 MG PO TABS: 5.0000 mg | ORAL_TABLET | Freq: Every evening | ORAL | Status: DC | PRN

## 2014-02-17 NOTE — Telephone Encounter (Signed)
Rx printed/signed and faxed to

## 2014-03-13 ENCOUNTER — Telehealth: Payer: Self-pay | Admitting: Geriatric Medicine

## 2014-03-13 NOTE — Telephone Encounter (Signed)
Patient decided not to get the CT. He is feeling better. He will call if he has any problems.

## 2014-03-13 NOTE — Telephone Encounter (Signed)
Message copied by Alger Memos on Mon Mar 13, 2014 10:43 AM ------      Message from: Steva Colder A      Created: Fri Mar 10, 2014  6:43 PM      Regarding: Regarding CT scan of head ordered for patient Richard Burgess on 01/31/14       Hi Amy,       Can you contact patient and find out if he had CT  Scan of head and if not why?  It doesn't appear he had scan performed.  He should have a follow up appointment with his  Primary Care Physician for re evaluation.        Thank you,       Steva Colder NP ------

## 2014-03-23 ENCOUNTER — Encounter: Payer: Self-pay | Admitting: Internal Medicine

## 2014-03-23 ENCOUNTER — Ambulatory Visit (INDEPENDENT_AMBULATORY_CARE_PROVIDER_SITE_OTHER): Payer: Commercial Managed Care - HMO | Admitting: Internal Medicine

## 2014-03-23 ENCOUNTER — Other Ambulatory Visit (INDEPENDENT_AMBULATORY_CARE_PROVIDER_SITE_OTHER): Payer: Commercial Managed Care - HMO

## 2014-03-23 VITALS — BP 120/66 | HR 68 | Temp 98.1°F | Resp 14 | Wt 206.1 lb

## 2014-03-23 DIAGNOSIS — R351 Nocturia: Secondary | ICD-10-CM

## 2014-03-23 DIAGNOSIS — N401 Enlarged prostate with lower urinary tract symptoms: Secondary | ICD-10-CM

## 2014-03-23 DIAGNOSIS — IMO0002 Reserved for concepts with insufficient information to code with codable children: Secondary | ICD-10-CM

## 2014-03-23 DIAGNOSIS — N508 Other specified disorders of male genital organs: Secondary | ICD-10-CM

## 2014-03-23 LAB — URINALYSIS
BILIRUBIN URINE: NEGATIVE
Hgb urine dipstick: NEGATIVE
Ketones, ur: NEGATIVE
Leukocytes, UA: NEGATIVE
Nitrite: NEGATIVE
SPECIFIC GRAVITY, URINE: 1.015 (ref 1.000–1.030)
Total Protein, Urine: NEGATIVE
URINE GLUCOSE: NEGATIVE
UROBILINOGEN UA: 0.2 (ref 0.0–1.0)
pH: 7.5 (ref 5.0–8.0)

## 2014-03-23 LAB — PSA: PSA: 0.28 ng/mL (ref 0.10–4.00)

## 2014-03-23 MED ORDER — DOXYCYCLINE HYCLATE 100 MG PO TABS: 100.0000 mg | ORAL_TABLET | Freq: Two times a day (BID) | ORAL | Status: DC

## 2014-03-23 NOTE — Patient Instructions (Signed)
Drink as much nondairy fluids as possible. Avoid spicy foods or alcohol as  these may aggravate the prostate & bladder. Do not take decongestants. Avoid narcotics if possible.  Fill the  prescription for antibiotic if urinalysis is normal.

## 2014-03-23 NOTE — Progress Notes (Signed)
Pre visit review using our clinic review tool, if applicable. No additional management support is needed unless otherwise documented below in the visit note. 

## 2014-03-23 NOTE — Progress Notes (Signed)
   Subjective:    Patient ID: Richard Burgess, male    DOB: June 28, 1928, 80 y.o.   MRN: [id number]  HPI   He describes intermittent groin pain for 2 weeks. The symptoms began after sexual intercourse. It is described as throbbing. It is worse with a full bladder; voiding does improve his symptoms.  It is described as a steady pain up to level VI-7.  He does have nocturia x1. He also has some hesitancy starting urination.  He has a history of prostatic hypertrophy.    Review of Systems  He denies dysuria, pyuria, or hematuria.  He has no bowel symptoms.     Objective:   Physical Exam   Positive or pertinent findings include: There is minimal asymmetry of the prostate. Right lobe is bigger than the left. There is no nodularity or induration. There is no enlargement. He has marked crepitus of the knees.  General appearance :adequately nourished; in no distress. Eyes: No conjunctival inflammation or scleral icterus is present. Oral exam: Dental hygiene is good. Lips and gums are healthy appearing.There is no oropharyngeal erythema or exudate noted.  Heart:  Normal rate and regular rhythm. S1 and S2 normal without gallop, murmur, click, rub or other extra sounds   Lungs:Chest clear to auscultation; no wheezes, rhonchi,rales ,or rubs present.No increased work of breathing.  Abdomen: bowel sounds normal, soft and non-tender without masses, organomegaly or hernias noted.  No guarding or rebound. No flank tenderness to percussion. Vascular : all pulses equal ; no bruits present. Skin:Warm & dry.  Intact without suspicious lesions or rashes ; no jaundice or tenting Lymphatic: No lymphadenopathy is noted about the head, neck, axilla, or inguinal areas.             Assessment & Plan:  #1 scrotal pain with inguinal radiation: Possible epididymitis.  #2 some minor lower urinary tract symptoms  Plan: Urinalysis  Possible course of doxycycline.

## 2014-03-27 ENCOUNTER — Telehealth: Payer: Self-pay | Admitting: Internal Medicine

## 2014-03-27 DIAGNOSIS — IMO0002 Reserved for concepts with insufficient information to code with codable children: Secondary | ICD-10-CM

## 2014-03-27 MED ORDER — DOXYCYCLINE HYCLATE 100 MG PO TABS: 100.0000 mg | ORAL_TABLET | Freq: Two times a day (BID) | ORAL | Status: DC

## 2014-03-27 NOTE — Telephone Encounter (Signed)
Notified pt wife with md response on lab from 03/23/14. Pt is wanting the doxycycline sent to [facility] she has misplace the script. Inform will send...Richard Burgess

## 2014-03-27 NOTE — Telephone Encounter (Signed)
Patient wife is calling for the result of husband urine test, she stated he was in a lot of pain. Please Advise

## 2014-04-03 ENCOUNTER — Telehealth: Payer: Self-pay | Admitting: Internal Medicine

## 2014-04-03 NOTE — Telephone Encounter (Signed)
Pt wife called in requesting call back about lab results.

## 2014-04-04 NOTE — Telephone Encounter (Signed)
Called pt no answer LMOM RTC.../lmb 

## 2014-04-04 NOTE — Telephone Encounter (Signed)
Pt confused by lab result, pt requesting a call. [phone]

## 2014-04-04 NOTE — Telephone Encounter (Signed)
Wife return call bacj she stated husband received lab letter from md he was confuse about information that was on report. Went over lab with pt wife she understood...Richard Burgess

## 2014-04-28 ENCOUNTER — Encounter: Payer: Self-pay | Admitting: Internal Medicine

## 2014-04-28 ENCOUNTER — Ambulatory Visit (INDEPENDENT_AMBULATORY_CARE_PROVIDER_SITE_OTHER): Payer: Commercial Managed Care - HMO | Admitting: Internal Medicine

## 2014-04-28 VITALS — BP 126/62 | HR 64 | Temp 98.5°F | Wt 209.0 lb

## 2014-04-28 DIAGNOSIS — I10 Essential (primary) hypertension: Secondary | ICD-10-CM

## 2014-04-28 DIAGNOSIS — E538 Deficiency of other specified B group vitamins: Secondary | ICD-10-CM

## 2014-04-28 DIAGNOSIS — M5440 Lumbago with sciatica, unspecified side: Secondary | ICD-10-CM

## 2014-04-28 DIAGNOSIS — I739 Peripheral vascular disease, unspecified: Secondary | ICD-10-CM

## 2014-04-28 DIAGNOSIS — L570 Actinic keratosis: Secondary | ICD-10-CM

## 2014-04-28 MED ORDER — TRIAMCINOLONE ACETONIDE 0.5 % EX OINT: 1.0000 "application " | TOPICAL_OINTMENT | Freq: Two times a day (BID) | CUTANEOUS | Status: DC

## 2014-04-28 NOTE — Assessment & Plan Note (Signed)
Continue with current prescription therapy as reflected on the Med list.  
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
## 2014-04-28 NOTE — Progress Notes (Signed)
Pre visit review using our clinic review tool, if applicable. No additional management support is needed unless otherwise documented below in the visit note. 

## 2014-04-28 NOTE — Assessment & Plan Note (Addendum)
Multiple - see procedure

## 2014-06-05 ENCOUNTER — Ambulatory Visit: Payer: Medicare Other | Admitting: Surgery

## 2014-06-05 ENCOUNTER — Other Ambulatory Visit (HOSPITAL_COMMUNITY): Payer: Medicare Other

## 2014-06-06 ENCOUNTER — Encounter: Payer: Self-pay | Admitting: Surgery

## 2014-06-12 ENCOUNTER — Inpatient Hospital Stay (HOSPITAL_COMMUNITY): Admission: RE | Admit: 2014-06-12 | Payer: Commercial Managed Care - HMO | Source: Ambulatory Visit

## 2014-06-12 ENCOUNTER — Ambulatory Visit: Payer: Self-pay | Admitting: Surgery

## 2014-07-08 ENCOUNTER — Other Ambulatory Visit: Payer: Self-pay | Admitting: Internal Medicine

## 2014-07-11 NOTE — Telephone Encounter (Signed)
Rf phoned in.  

## 2014-07-14 ENCOUNTER — Encounter: Payer: Self-pay | Admitting: Surgery

## 2014-07-17 ENCOUNTER — Encounter: Payer: Self-pay | Admitting: Surgery

## 2014-07-17 ENCOUNTER — Ambulatory Visit (HOSPITAL_COMMUNITY)
Admission: RE | Admit: 2014-07-17 | Discharge: 2014-07-17 | Disposition: A | Discharge status: Home / Self Care | Payer: Commercial Managed Care - HMO | Source: Ambulatory Visit | Attending: Surgery | Admitting: Surgery

## 2014-07-17 ENCOUNTER — Ambulatory Visit (INDEPENDENT_AMBULATORY_CARE_PROVIDER_SITE_OTHER): Payer: Commercial Managed Care - HMO | Admitting: Surgery

## 2014-07-17 DIAGNOSIS — I6523 Occlusion and stenosis of bilateral carotid arteries: Secondary | ICD-10-CM | POA: Diagnosis not present

## 2014-07-17 NOTE — Progress Notes (Signed)
Patient name: Richard Burgess MRN: [id number] DOB: 1928-11-02 Sex: male     Chief Complaint  Patient presents with  . Re-evaluation    1 yr Occlusion/ Sstenosis of Carotid with vascular lab duplex    HISTORY OF PRESENT ILLNESS: The patient is back today for follow-up of his carotid occlusive disease.  Since I last saw him he had one episode of neck pain which was very bothersome for him and he took antibiotics which his daughter had his symptoms lasted for 2 days.  He denies any numbness or weakness during that episode.  Since I last saw him, he has not had any neurologic issues.  He denies numbness or weakness in either extremity.  He denies slurred speech.  He denies amaurosis fugax.  The patient is medically managed for hypercholesterolemia with a statin.  He ran out of his medicine approximate 2 weeks ago and got a generic medication which caused him to have nightmares.  He has not been taking his cholesterol medicine for 2 weeks.  He continues to be a nonsmoker.  His hypertension is treated with an ARB.  He is on single agent antiplatelet therapy with a baby aspirin.  Past Medical History  Diagnosis Date  . Diverticulosis of colon   . Hypertention, malignant, with acute intensive management   . PVD (peripheral vascular disease)     Bilateral carotid  . Hx-TIA (transient ischemic attack)   . ED (erectile dysfunction)   . Glucose intolerance (impaired glucose tolerance)   . Hyperlipidemia   . GERD (gastroesophageal reflux disease)   . Allergy   . Osteoarthritis   . Low back pain   . History of pancreatitis   . BPH (benign prostatic hypertrophy)   . Tubular adenoma of colon 08/1991    One with carcinoma IN [facility] 1993, multiple adenomatous  . Hypertension   . Carotid artery occlusion     Past Surgical History  Procedure Laterality Date  . Appendectomy    . Cholecystectomy    . Basal cell carcinoma excision    . Lumbar spine surgery      X4    History   Social  History  . Marital Status: Married    Spouse Name: N/A    Number of Children: 3  . Years of Education: N/A   Occupational History  . Retired-Construction    Social History Main Topics  . Smoking status: Former Research scientist (life sciences)  . Smokeless tobacco: Never Used     Comment: Daily caffeine use, regular exercise  . Alcohol Use: 3.5 - 7.0 oz/week    7-14 Not specified per week  . Drug Use: No  . Sexual Activity: Yes   Other Topics Concern  . Not on file   Social History Narrative    Family History  Problem Relation Age of Onset  . Dementia Father   . Colon cancer Sister   . Diabetes Mother   . Peripheral vascular disease Brother     Allergies as of 07/17/2014 - Review Complete 07/17/2014  Allergen Reaction Noted  . Oxycodone  01/21/2012  . Pravastatin sodium  08/23/2010  . Rosuvastatin  08/23/2010  . Statins  04/03/2009    Current Outpatient Prescriptions on File Prior to Visit  Medication Sig Dispense Refill  . aspirin 81 MG tablet Take 81 mg by mouth daily.      . Cholecalciferol (SM VITAMIN D3) 1000 UNITS tablet Take 1,000 Units by mouth daily.      . Cyanocobalamin (  VITAMIN B-12) 1000 MCG SUBL Place 1 tablet (1,000 mcg total) under the tongue daily. 100 tablet 3  . losartan (COZAAR) 100 MG tablet Take 1 tablet (100 mg total) by mouth daily. 90 tablet 3  . omeprazole (PRILOSEC) 40 MG capsule Take 1 capsule (40 mg total) by mouth daily. 90 capsule 3  . Red Yeast Rice 600 MG CAPS Take 1 each by mouth 2 (two) times daily.    Marland Kitchen triamcinolone ointment (KENALOG) 0.5 % Apply 1 application topically 2 (two) times daily. 60 g 1  . amLODipine (NORVASC) 5 MG tablet Take 1 tablet (5 mg total) by mouth daily. 90 tablet 3  . colesevelam (WELCHOL) 625 MG tablet Take 2 tablets (1,250 mg total) by mouth 2 (two) times daily with a meal. (Patient not taking: Reported on 07/17/2014) 360 tablet 3  . doxycycline (VIBRA-TABS) 100 MG tablet Take 1 tablet (100 mg total) by mouth 2 (two) times daily.  (Patient not taking: Reported on 07/17/2014) 20 tablet 0  . fluticasone (FLONASE) 50 MCG/ACT nasal spray Place 2 sprays into the nose daily. 16 g 6  . sildenafil (VIAGRA) 100 MG tablet Take 1 tablet (100 mg total) by mouth as needed. (Patient not taking: Reported on 07/17/2014) 90 tablet 3  . traMADol (ULTRAM) 50 MG tablet TAKE 1 TABLET BY MOUTH EVERY 8 HOURS AS NEEDED (Patient not taking: Reported on 07/17/2014) 90 tablet 3  . zolpidem (AMBIEN) 5 MG tablet Take 1 tablet (5 mg total) by mouth at bedtime as needed for sleep. (Patient not taking: Reported on 07/17/2014) 90 tablet 2   Current Facility-Administered Medications on File Prior to Visit  Medication Dose Route Frequency Provider Last Rate Last Dose  . methylPREDNISolone acetate (DEPO-MEDROL) injection 80 mg  80 mg Intra-articular Once Aleksei Plotnikov V, MD         REVIEW OF SYSTEMS: Please see history of present illness, otherwise all systems are negative  PHYSICAL EXAMINATION:   Vital signs are BP 154/75 mmHg  Pulse 61  Resp 16  Ht 5\' 11"  (1.803 m)  Wt 213 lb (96.616 kg)  BMI 29.72 kg/m2  SpO2 97% General: The patient appears their stated age. HEENT:  No gross abnormalities Pulmonary:  Non labored breathing Abdomen: Soft and non-tender.  aorta was not palpated despite deep palpation Musculoskeletal: There are no major deformities. Neurologic: No focal weakness or paresthesias are detected, Skin: There are no ulcer or rashes noted. Psychiatric: The patient has normal affect. Cardiovascular: There is a regular rate and rhythm without significant murmur appreciated.no carotid bruit   Diagnostic Studies I have reviewed his ultrasound.  This shows 40-59 percent right carotid stenosis and less than 40% left carotid stenosis  Assessment: Asymptomatic bilateral carotid stenosis, right greater than left Plan: Gus the importance of medical management to treat his carotid occlusive disease.  I stressed that it was important to get  back on his routine cholesterol medication.  I will continue with annual surveillance with his next study next February.  Eldridge Abrahams, M.D. [facility] Office: [phone] Pager:  [phone]

## 2014-07-17 NOTE — Addendum Note (Signed)
Addended by: Mena Goes on: 07/17/2014 04:17 PM   Modules accepted: Orders

## 2014-07-31 ENCOUNTER — Encounter: Payer: Self-pay | Admitting: Internal Medicine

## 2014-07-31 ENCOUNTER — Ambulatory Visit (INDEPENDENT_AMBULATORY_CARE_PROVIDER_SITE_OTHER): Payer: Commercial Managed Care - HMO | Admitting: Internal Medicine

## 2014-07-31 VITALS — BP 138/76 | HR 73 | Temp 98.4°F | Wt 214.0 lb

## 2014-07-31 DIAGNOSIS — I6523 Occlusion and stenosis of bilateral carotid arteries: Secondary | ICD-10-CM

## 2014-07-31 DIAGNOSIS — J309 Allergic rhinitis, unspecified: Secondary | ICD-10-CM

## 2014-07-31 DIAGNOSIS — E538 Deficiency of other specified B group vitamins: Secondary | ICD-10-CM

## 2014-07-31 DIAGNOSIS — N528 Other male erectile dysfunction: Secondary | ICD-10-CM

## 2014-07-31 DIAGNOSIS — N529 Male erectile dysfunction, unspecified: Secondary | ICD-10-CM

## 2014-07-31 DIAGNOSIS — I10 Essential (primary) hypertension: Secondary | ICD-10-CM

## 2014-07-31 DIAGNOSIS — M5440 Lumbago with sciatica, unspecified side: Secondary | ICD-10-CM

## 2014-07-31 DIAGNOSIS — I739 Peripheral vascular disease, unspecified: Secondary | ICD-10-CM

## 2014-07-31 NOTE — Assessment & Plan Note (Signed)
Continue with current prescription therapy as reflected on the Med list.  

## 2014-07-31 NOTE — Patient Instructions (Signed)
Try Cholestoff for high cholesterol

## 2014-07-31 NOTE — Assessment & Plan Note (Signed)
Conservative management Last carotid US 2/16

## 2014-07-31 NOTE — Assessment & Plan Note (Signed)
Continue with current prn prescription therapy as reflected on the Med list.  

## 2014-07-31 NOTE — Progress Notes (Signed)
Pre visit review using our clinic review tool, if applicable. No additional management support is needed unless otherwise documented below in the visit note. 

## 2014-07-31 NOTE — Progress Notes (Signed)
   Subjective:    HPI     F/u PVD 5/14 R >70% at the [facility] Chronic swallowing issues - saw ENT The patient presents for a follow-up of  chronic hypertension, chronic dyslipidemia, ED controlled with medicines.  Wt Readings from Last 3 Encounters:  07/31/14 214 lb (97.07 kg)  07/17/14 213 lb (96.616 kg)  04/28/14 209 lb (94.802 kg)   BP Readings from Last 3 Encounters:  07/31/14 138/76  07/17/14 154/75  04/28/14 126/62       Review of Systems  Constitutional: Negative for appetite change and unexpected weight change.  HENT: Negative for nosebleeds, sneezing and trouble swallowing.   Eyes: Negative for itching and visual disturbance.  Cardiovascular: Negative for palpitations and leg swelling.  Gastrointestinal: Negative for diarrhea, blood in stool and abdominal distention.  Genitourinary: Negative for frequency and hematuria.  Musculoskeletal: Positive for back pain. Negative for gait problem.  Neurological: Negative for dizziness, tremors and speech difficulty.  Psychiatric/Behavioral: Negative for sleep disturbance, dysphoric mood and agitation. The patient is not nervous/anxious.        Objective:   Physical Exam  Constitutional: He is oriented to person, place, and time. He appears well-developed. No distress.  NAD  HENT:  Mouth/Throat: Oropharynx is clear and moist.  Eyes: Conjunctivae are normal. Pupils are equal, round, and reactive to light.  Neck: Normal range of motion. No JVD present. No thyromegaly present.  Cardiovascular: Normal rate, regular rhythm, normal heart sounds and intact distal pulses.  Exam reveals no gallop and no friction rub.   No murmur heard. Pulmonary/Chest: Effort normal and breath sounds normal. No respiratory distress. He has no wheezes. He has no rales. He exhibits no tenderness.  Abdominal: Soft. Bowel sounds are normal. He exhibits no distension and no mass. There is no tenderness. There is no rebound and no guarding.   Musculoskeletal: Normal range of motion. He exhibits no edema or tenderness.  Lymphadenopathy:    He has no cervical adenopathy.  Neurological: He is alert and oriented to person, place, and time. He has normal reflexes. No cranial nerve deficit. He exhibits normal muscle tone. He displays a negative Romberg sign. Coordination and gait normal.  No meningeal signs  Skin: Skin is warm and dry. No rash noted.  Psychiatric: He has a normal mood and affect. His behavior is normal. Judgment and thought content normal.  B str leg elev is neg LS w/decr ROM Dry skin     5/14 [place]: R carotid >70% at the [place]; recent done - sat the [place] - same (2016)      Assessment & Plan:

## 2014-08-19 ENCOUNTER — Other Ambulatory Visit: Payer: Self-pay | Admitting: Internal Medicine

## 2014-10-18 ENCOUNTER — Telehealth: Payer: Self-pay

## 2014-10-18 NOTE — Telephone Encounter (Signed)
Call to the patient and spouse answered; educated on AWV and stated that she is not sure Mr. Whitmoyer would be interested, but has an apt with Dr. Alain Marion 5/23 and will try to speak to him then. Stated that would be helpful

## 2014-10-20 ENCOUNTER — Telehealth: Payer: Self-pay

## 2014-10-20 NOTE — Telephone Encounter (Signed)
Will try to see the 23rd when he comes in for an OV with Plotnikov.

## 2014-10-23 ENCOUNTER — Other Ambulatory Visit: Payer: Self-pay | Admitting: Internal Medicine

## 2014-10-30 ENCOUNTER — Other Ambulatory Visit (INDEPENDENT_AMBULATORY_CARE_PROVIDER_SITE_OTHER): Payer: Commercial Managed Care - HMO

## 2014-10-30 ENCOUNTER — Encounter: Payer: Self-pay | Admitting: Internal Medicine

## 2014-10-30 ENCOUNTER — Ambulatory Visit (INDEPENDENT_AMBULATORY_CARE_PROVIDER_SITE_OTHER): Payer: Commercial Managed Care - HMO | Admitting: Internal Medicine

## 2014-10-30 DIAGNOSIS — E538 Deficiency of other specified B group vitamins: Secondary | ICD-10-CM | POA: Diagnosis not present

## 2014-10-30 DIAGNOSIS — R634 Abnormal weight loss: Secondary | ICD-10-CM

## 2014-10-30 DIAGNOSIS — Z0000 Encounter for general adult medical examination without abnormal findings: Secondary | ICD-10-CM

## 2014-10-30 DIAGNOSIS — I10 Essential (primary) hypertension: Secondary | ICD-10-CM

## 2014-10-30 DIAGNOSIS — R198 Other specified symptoms and signs involving the digestive system and abdomen: Secondary | ICD-10-CM

## 2014-10-30 LAB — BASIC METABOLIC PANEL
BUN: 19 mg/dL (ref 6–23)
CO2: 26 mEq/L (ref 19–32)
Calcium: 9.9 mg/dL (ref 8.4–10.5)
Chloride: 103 mEq/L (ref 96–112)
Creatinine, Ser: 1.02 mg/dL (ref 0.40–1.50)
GFR: 73.97 mL/min (ref >60.00)
Glucose, Bld: 119 mg/dL — ABNORMAL HIGH (ref 70–99)
POTASSIUM: 4.7 meq/L (ref 3.5–5.1)
Sodium: 137 mEq/L (ref 135–145)

## 2014-10-30 LAB — URINALYSIS
BILIRUBIN URINE: NEGATIVE
HGB URINE DIPSTICK: NEGATIVE
KETONES UR: NEGATIVE
LEUKOCYTES UA: NEGATIVE
NITRITE: NEGATIVE
SPECIFIC GRAVITY, URINE: 1.01 (ref 1.000–1.030)
TOTAL PROTEIN, URINE-UPE24: NEGATIVE
URINE GLUCOSE: NEGATIVE
Urobilinogen, UA: 0.2 (ref 0.0–1.0)
pH: 7 (ref 5.0–8.0)

## 2014-10-30 LAB — CBC WITH DIFFERENTIAL/PLATELET
BASOS PCT: 0.3 % (ref 0.0–3.0)
Basophils Absolute: 0 10*3/uL (ref 0.0–0.1)
EOS PCT: 4.1 % (ref 0.0–5.0)
Eosinophils Absolute: 0.3 10*3/uL (ref 0.0–0.7)
HCT: 43.3 % (ref 39.0–52.0)
HEMOGLOBIN: 14.8 g/dL (ref 13.0–17.0)
LYMPHS PCT: 26.2 % (ref 12.0–46.0)
Lymphs Abs: 2 10*3/uL (ref 0.7–4.0)
MCHC: 34.2 g/dL (ref 30.0–36.0)
MCV: 87.4 fl (ref 78.0–100.0)
MONOS PCT: 9 % (ref 3.0–12.0)
Monocytes Absolute: 0.7 10*3/uL (ref 0.1–1.0)
NEUTROS PCT: 60.4 % (ref 43.0–77.0)
Neutro Abs: 4.7 10*3/uL (ref 1.4–7.7)
Platelets: 198 10*3/uL (ref 150.0–400.0)
RBC: 4.95 Mil/uL (ref 4.22–5.81)
RDW: 13.2 % (ref 11.5–15.5)
WBC: 7.7 10*3/uL (ref 4.0–10.5)

## 2014-10-30 LAB — SEDIMENTATION RATE: Sed Rate: 22 mm/hr (ref 0–22)

## 2014-10-30 LAB — LIPASE: Lipase: 12 U/L (ref 11.0–59.0)

## 2014-10-30 MED ORDER — TRAMADOL HCL 50 MG PO TABS: 50.0000 mg | ORAL_TABLET | Freq: Three times a day (TID) | ORAL | Status: DC | PRN

## 2014-10-30 NOTE — Progress Notes (Signed)
Pre visit review using our clinic review tool, if applicable. No additional management support is needed unless otherwise documented below in the visit note. 

## 2014-10-30 NOTE — Assessment & Plan Note (Signed)
5/16 R sided ?etiology w/wt loss Labs, abd CT GI ref to Dr Fuller Plan if neg CT

## 2014-10-30 NOTE — Progress Notes (Signed)
   Subjective:    HPI   C/o "food stops up after I eat" x 3 weeks. Pt is pointing up at the RLQ and  His belly button area. It is new. No n/v. He lost wt.  F/u PVD 5/14 R >70% at the [facility] Chronic swallowing issues - saw ENT The patient presents for a follow-up of  chronic hypertension, chronic dyslipidemia, ED controlled with medicines.  Wt Readings from Last 3 Encounters:  10/30/14 206 lb (93.441 kg)  07/31/14 214 lb (97.07 kg)  07/17/14 213 lb (96.616 kg)   BP Readings from Last 3 Encounters:  10/30/14 124/60  07/31/14 138/76  07/17/14 154/75       Review of Systems  Constitutional: Negative for appetite change and unexpected weight change.  HENT: Negative for nosebleeds, sneezing and trouble swallowing.   Eyes: Negative for itching and visual disturbance.  Cardiovascular: Negative for palpitations and leg swelling.  Gastrointestinal: Negative for diarrhea, blood in stool and abdominal distention.  Genitourinary: Negative for frequency and hematuria.  Musculoskeletal: Positive for back pain. Negative for gait problem.  Neurological: Negative for dizziness, tremors and speech difficulty.  Psychiatric/Behavioral: Negative for sleep disturbance, dysphoric mood and agitation. The patient is not nervous/anxious.        Objective:   Physical Exam  Constitutional: He is oriented to person, place, and time. He appears well-developed. No distress.  NAD  HENT:  Mouth/Throat: Oropharynx is clear and moist.  Eyes: Conjunctivae are normal. Pupils are equal, round, and reactive to light.  Neck: Normal range of motion. No JVD present. No thyromegaly present.  Cardiovascular: Normal rate, regular rhythm, normal heart sounds and intact distal pulses.  Exam reveals no gallop and no friction rub.   No murmur heard. Pulmonary/Chest: Effort normal and breath sounds normal. No respiratory distress. He has no wheezes. He has no rales. He exhibits no tenderness.  Abdominal: Soft. Bowel  sounds are normal. He exhibits no distension and no mass. There is no tenderness. There is no rebound and no guarding.  Musculoskeletal: Normal range of motion. He exhibits no edema or tenderness.  Lymphadenopathy:    He has no cervical adenopathy.  Neurological: He is alert and oriented to person, place, and time. He has normal reflexes. No cranial nerve deficit. He exhibits normal muscle tone. He displays a negative Romberg sign. Coordination and gait normal.  No meningeal signs  Skin: Skin is warm and dry. No rash noted.  Psychiatric: He has a normal mood and affect. His behavior is normal. Judgment and thought content normal.  B str leg elev is neg LS w/decr ROM Dry skin   2013 colonoscopy  5/14 [place]: R carotid >70% at the [place]; recent done - sat the [place] - same (2016)      Assessment & Plan:    No n/v. He lost wt

## 2014-10-30 NOTE — Patient Instructions (Signed)

## 2014-10-30 NOTE — Progress Notes (Signed)
Subjective:   Richard Burgess is a 81 y.o. male who presents for Medicare Annual/Subsequent preventive examination.  Review of Systems:  HRA assessment completed during visit  Health better, the same or worse than last year? The same  Health described as excellent; very good; good; fair or poor? good Current Exercise:1/2 acre garden   Current dietary; wife cooks; bacon; eggs;  Wife cooks lunch and supper Garden ; Beans, tomatoes, squash; multiple vegetables  Vision had eye exam in this year; completed; Over at [place]; States he has something; dry eye  Dental: Tops/ dentures; haven't seen a dentist; had 5 teeth pulled on the bottom; Left teeth on the bottom;  Complicated by anatomical structure of bottom teeth so can't wear dentures without surgery;  can chew on the bottom teeth.    Generalized Safety in the home reviewed  Home safety reviewed; Worked in [employer] is still good Also discussed driving safety Given information on home safety; poison control; community safety;  Safety sheets taken home  c/o of pain in right knee; may be time to reschedule apt to [place]; none in the last year Discussed risk of loss of immobility with knees and the patient agreed to schedule fup with doctor; but could not remember the doctor's name     Current Care Team reviewed and updated May need to discuss fup with knees upon next office visit;          Objective:    Vitals: BP 124/60 mmHg  Pulse 73  Wt 206 lb (93.441 kg)  SpO2 97%  Tobacco History  Smoking status  . Former Smoker  Smokeless tobacco  . Never Used    Comment: Daily caffeine use, regular exercise     Counseling given: Not Answered Counseling given prior to assessment  Past Medical History  Diagnosis Date  . Diverticulosis of colon   . Hypertention, malignant, with acute intensive management   . PVD (peripheral vascular disease)     Bilateral carotid  . Hx-TIA (transient ischemic  attack)   . ED (erectile dysfunction)   . Glucose intolerance (impaired glucose tolerance)   . Hyperlipidemia   . GERD (gastroesophageal reflux disease)   . Allergy   . Osteoarthritis   . Low back pain   . History of pancreatitis   . BPH (benign prostatic hypertrophy)   . Tubular adenoma of colon 08/1991    One with carcinoma IN SITU 1993, multiple adenomatous  . Hypertension   . Carotid artery occlusion    Past Surgical History  Procedure Laterality Date  . Appendectomy    . Cholecystectomy    . Basal cell carcinoma excision    . Lumbar spine surgery      X4   Family History  Problem Relation Age of Onset  . Dementia Father   . Colon cancer Sister   . Diabetes Mother   . Peripheral vascular disease Brother    History  Sexual Activity  . Sexual Activity: Yes    Outpatient Encounter Prescriptions as of 10/30/2014  Medication Sig  . amLODipine (NORVASC) 5 MG tablet TAKE 1 TABLET DAILY.  Marland Kitchen aspirin 81 MG tablet Take 81 mg by mouth daily.    . Cholecalciferol (SM VITAMIN D3) 1000 UNITS tablet Take 1,000 Units by mouth daily.    . colesevelam (WELCHOL) 625 MG tablet Take 2 tablets (1,250 mg total) by mouth 2 (two) times daily with a meal.  . Cyanocobalamin (VITAMIN B-12) 1000 MCG SUBL Place  1 tablet (1,000 mcg total) under the tongue daily.  Marland Kitchen doxycycline (VIBRA-TABS) 100 MG tablet Take 1 tablet (100 mg total) by mouth 2 (two) times daily.  Marland Kitchen losartan (COZAAR) 100 MG tablet TAKE 1 TABLET EVERY DAY  . Multiple Vitamins-Minerals (VISION-VITE PRESERVE PO) Take by mouth as needed.  Marland Kitchen omeprazole (PRILOSEC) 40 MG capsule TAKE 1 CAPSULE EVERY DAY  . Red Yeast Rice 600 MG CAPS Take 1 each by mouth 2 (two) times daily.  . sildenafil (VIAGRA) 100 MG tablet Take 1 tablet (100 mg total) by mouth as needed.  . traMADol (ULTRAM) 50 MG tablet Take 1 tablet (50 mg total) by mouth every 8 (eight) hours as needed.  . triamcinolone ointment (KENALOG) 0.5 % Apply 1 application topically 2 (two)  times daily.  Marland Kitchen zolpidem (AMBIEN) 5 MG tablet Take 1 tablet (5 mg total) by mouth at bedtime as needed for sleep.  . [DISCONTINUED] traMADol (ULTRAM) 50 MG tablet TAKE 1 TABLET BY MOUTH EVERY 8 HOURS AS NEEDED  . fluticasone (FLONASE) 50 MCG/ACT nasal spray Place 2 sprays into the nose daily.   Facility-Administered Encounter Medications as of 10/30/2014  Medication  . methylPREDNISolone acetate (DEPO-MEDROL) injection 80 mg    Activities of Daily Living In your present state of health, do you have any difficulty performing the following activities: 10/30/2014  Hearing? N  Vision? N  Difficulty concentrating or making decisions? N  Walking or climbing stairs? Y  Dressing or bathing? N  Doing errands, shopping? N    Patient Care Team: Cassandria Anger, MD as PCP - General   Assessment:    Objective:  VS: stable BMI: deferred due to active lifestyle and low bp; as well as fup on acute abd issue as the reason for the visit today with MD.  Understand risk and lifestyle choices than can impede health: (CVD; Metabolic syndrome; functional decline; discussed;  Taking red rice yeast  Personalized Education given regarding: risk for falls and risk of pneumonia The patient doesn't take pneumonia vaccines; discussed falls or pneumonia can impact life and health. Declined Pneumovax at this time.  Pt determined a personalized goal  To remain active To fup right knee pain   Assessment included: Taking meds without issues; no barriers identified Stress: Recommendations for managing stress if assessed as a factor;  No Risk for hepatitis or high risk social behavior identified via hepatitis screen Educated on shingles and follow up with insurance company for co-pays or charges applied to Part D benefit.  Safety issues reviewed(driving issues; vision; home environment; support and environmental safety)  Cognition assessed by AD8; Score 0 (A score of 2 or greater would indicate the  MMSE be completed)    Need for Immunizations or other screenings identified;  (CDC recommmend Prevnar at 65 followed by pnuemovax 23 in one year or 5 years after the last dose.  Health Maintenance up to date and a Preventive Wellness Plan was given to the patient Again, does not take vaccinations;     Exercise Activities and Dietary recommendations    Goals    . to Remain Active     Does 1/2 gardening and stays active on farm. Eats 3 meals a day;  Discussed need to fup on right knee; which to hurt more and can impair his active lifestyle.      Fall Risk Fall Risk  10/30/2014 10/30/2014 07/31/2014  Falls in the past year? No No No   Depression Screen PHQ 2/9 Scores 10/30/2014 10/30/2014 07/31/2014  PHQ - 2 Score 0 0 0    Cognitive Testing MMSE - Mini Mental State Exam 10/30/2014  Not completed: Unable to complete    Immunization History  Administered Date(s) Administered  . Pneumococcal Conjugate-13 08/01/2013  . Td 02/23/2008   Screening Tests Health Maintenance  Topic Date Due  . ZOSTAVAX  12/09/1990  . PNA vac Low Risk Adult (2 of 2 - PPSV23) 08/01/2014  . COLONOSCOPY  08/15/2014  . INFLUENZA VACCINE  04/29/2015 (Originally 01/08/2015)  . TETANUS/TDAP  02/22/2018      Plan:   Plan   The patient agrees to:  Fup labs and CT scan per today's visit with the MD Will fup on seeing prior doctor regarding right knee; does not know the name of the doctor  Advanced directive: Completed   During the course of the visit the patient was educated and counseled about the following appropriate screening and preventive services:   Vaccines to include Pneumoccal, Influenza, Hepatitis B, Td, Zostavax, HCV/ Refuses Pneumovax 23   Electrocardiogram/ deferred  Cardiovascular Disease/ uses red rice yeast  Colorectal cancer screening/ completed in 2013; but states he will probably repeat this soon; Hx of cancerous polyps per the patient during 1st colonoscopy and generally  repeats these q 5 years.  Diabetes screening/ deferred to MD;   Prostate Cancer Screening/ deferred  Glaucoma screening/ managed at [facility]; completed per the patient  Nutrition counseling / no desire to change diet; will defer with active lifestyle, very functional; eats vegetables from his garden  Smoking cessation counseling/   Patient Instructions (the written plan) was given to the patient.    Wynetta Fines, RN  10/30/2014

## 2014-10-30 NOTE — Assessment & Plan Note (Signed)
On Amlodipine 

## 2014-10-30 NOTE — Assessment & Plan Note (Signed)
On B12 

## 2014-10-30 NOTE — Addendum Note (Signed)
Addended byWynetta Fines on: 10/30/2014 09:44 AM   Modules accepted: Level of Service, SmartSet

## 2014-11-02 NOTE — Addendum Note (Signed)
Addended by: Gala Lewandowsky B on: 11/02/2014 09:09 AM   Modules accepted: Level of Service

## 2014-11-13 ENCOUNTER — Telehealth: Payer: Self-pay | Admitting: Internal Medicine

## 2014-11-13 NOTE — Telephone Encounter (Signed)
Richard Burgess, Who is suppose to call in the contrast for this pt to drink before the CT?   Pt wife called in said that she went to drug store to pick it up and it wasn't there.     Best number [phone]

## 2014-11-13 NOTE — Telephone Encounter (Signed)
No one, the contrast is here at our [facility] office. I called pt's wife- I informed her to p/u contrast w/instructions here at [facility] office 1 st floor front desk.

## 2014-11-15 ENCOUNTER — Ambulatory Visit (INDEPENDENT_AMBULATORY_CARE_PROVIDER_SITE_OTHER)
Admission: RE | Admit: 2014-11-15 | Discharge: 2014-11-15 | Disposition: A | Discharge status: Home / Self Care | Payer: Commercial Managed Care - HMO | Source: Ambulatory Visit | Attending: Internal Medicine | Admitting: Internal Medicine

## 2014-11-15 DIAGNOSIS — R198 Other specified symptoms and signs involving the digestive system and abdomen: Secondary | ICD-10-CM | POA: Diagnosis not present

## 2014-11-15 DIAGNOSIS — R634 Abnormal weight loss: Secondary | ICD-10-CM | POA: Diagnosis not present

## 2014-11-15 MED ORDER — IOHEXOL 300 MG/ML  SOLN
100.0000 mL | Freq: Once | INTRAMUSCULAR | Status: AC | PRN
  Administered 2014-11-15: 100 mL via INTRAVENOUS

## 2014-11-16 ENCOUNTER — Other Ambulatory Visit: Payer: Self-pay | Admitting: Internal Medicine

## 2014-11-16 NOTE — Telephone Encounter (Signed)
Notified pharmacy spoke with Ebony Hail gave md approval.../lmb

## 2014-11-17 ENCOUNTER — Encounter: Payer: Self-pay | Admitting: Internal Medicine

## 2014-11-17 ENCOUNTER — Ambulatory Visit (INDEPENDENT_AMBULATORY_CARE_PROVIDER_SITE_OTHER): Payer: Commercial Managed Care - HMO | Admitting: Internal Medicine

## 2014-11-17 VITALS — BP 118/66 | HR 71 | Wt 204.0 lb

## 2014-11-17 DIAGNOSIS — R634 Abnormal weight loss: Secondary | ICD-10-CM

## 2014-11-17 DIAGNOSIS — R1310 Dysphagia, unspecified: Secondary | ICD-10-CM

## 2014-11-17 MED ORDER — ZOLPIDEM TARTRATE 5 MG PO TABS: 5.0000 mg | ORAL_TABLET | Freq: Every evening | ORAL | Status: DC | PRN

## 2014-11-17 MED ORDER — PANTOPRAZOLE SODIUM 40 MG PO TBEC: 40.0000 mg | DELAYED_RELEASE_TABLET | Freq: Every day | ORAL | Status: DC

## 2014-11-17 NOTE — Assessment & Plan Note (Signed)
?  etiology - gastric outlet obstruction sx's  GI ref to see Dr Fuller Plan   6/16 CT abdomen IMPRESSION: No acute findings or other significant abnormality identified within the abdomen or pelvis.  Protonix 40 mg/d Severe lower lumbar degenerative spondylosis incidentally noted.   Electronically Signed  By: Earle Gell M.D.  On: 11/15/2014 16:21

## 2014-11-17 NOTE — Progress Notes (Signed)
Pre visit review using our clinic review tool, if applicable. No additional management support is needed unless otherwise documented below in the visit note. 

## 2014-11-17 NOTE — Progress Notes (Signed)
   Subjective:    HPI   F/u "food stops up after I eat" x 6 weeks - better. Pt is pointing up at the RLQ and  His belly button area. No n/v. He lost wt. All is better.  F/u PVD 5/14 R >70% at the [facility] Chronic swallowing issues - saw ENT The patient presents for a follow-up of  chronic hypertension, chronic dyslipidemia, ED controlled with medicines.  Wt Readings from Last 3 Encounters:  11/17/14 204 lb (92.534 kg)  10/30/14 206 lb (93.441 kg)  07/31/14 214 lb (97.07 kg)   BP Readings from Last 3 Encounters:  11/17/14 118/66  10/30/14 124/60  07/31/14 138/76       Review of Systems  Constitutional: Negative for appetite change and unexpected weight change.  HENT: Negative for nosebleeds, sneezing and trouble swallowing.   Eyes: Negative for itching and visual disturbance.  Cardiovascular: Negative for palpitations and leg swelling.  Gastrointestinal: Negative for diarrhea, blood in stool and abdominal distention.  Genitourinary: Negative for frequency and hematuria.  Musculoskeletal: Positive for back pain. Negative for gait problem.  Neurological: Negative for dizziness, tremors and speech difficulty.  Psychiatric/Behavioral: Negative for sleep disturbance, dysphoric mood and agitation. The patient is not nervous/anxious.        Objective:   Physical Exam  Constitutional: He is oriented to person, place, and time. He appears well-developed. No distress.  NAD  HENT:  Mouth/Throat: Oropharynx is clear and moist.  Eyes: Conjunctivae are normal. Pupils are equal, round, and reactive to light.  Neck: Normal range of motion. No JVD present. No thyromegaly present.  Cardiovascular: Normal rate, regular rhythm, normal heart sounds and intact distal pulses.  Exam reveals no gallop and no friction rub.   No murmur heard. Pulmonary/Chest: Effort normal and breath sounds normal. No respiratory distress. He has no wheezes. He has no rales. He exhibits no tenderness.  Abdominal:  Soft. Bowel sounds are normal. He exhibits no distension and no mass. There is no tenderness. There is no rebound and no guarding.  Musculoskeletal: Normal range of motion. He exhibits no edema or tenderness.  Lymphadenopathy:    He has no cervical adenopathy.  Neurological: He is alert and oriented to person, place, and time. He has normal reflexes. No cranial nerve deficit. He exhibits normal muscle tone. He displays a negative Romberg sign. Coordination and gait normal.  No meningeal signs  Skin: Skin is warm and dry. No rash noted.  Psychiatric: He has a normal mood and affect. His behavior is normal. Judgment and thought content normal.  B str leg elev is neg LS w/decr ROM Dry skin   2013 colonoscopy  5/14 [place]: R carotid >70% at the [facility]; recent done - sat the [place] - same (2016)    6/16 CT abdomen IMPRESSION: No acute findings or other significant abnormality identified within the abdomen or pelvis.  Severe lower lumbar degenerative spondylosis incidentally noted.   Electronically Signed  By: Earle Gell M.D.  On: 11/15/2014 16:21    Assessment & Plan:    No n/v. He lost wt

## 2014-11-19 NOTE — Assessment & Plan Note (Signed)
GI ref to Dr Fuller Plan. Abd CT reviewed Wt Readings from Last 3 Encounters:  11/17/14 204 lb (92.534 kg)  10/30/14 206 lb (93.441 kg)  07/31/14 214 lb (97.07 kg)

## 2014-11-25 ENCOUNTER — Other Ambulatory Visit: Payer: Self-pay | Admitting: Internal Medicine

## 2014-12-18 ENCOUNTER — Ambulatory Visit (INDEPENDENT_AMBULATORY_CARE_PROVIDER_SITE_OTHER): Payer: Commercial Managed Care - HMO | Admitting: Gastroenterology

## 2014-12-18 ENCOUNTER — Encounter: Payer: Self-pay | Admitting: Gastroenterology

## 2014-12-18 VITALS — BP 138/60 | HR 72 | Ht 69.0 in | Wt 205.6 lb

## 2014-12-18 DIAGNOSIS — R140 Abdominal distension (gaseous): Secondary | ICD-10-CM | POA: Diagnosis not present

## 2014-12-18 MED ORDER — METRONIDAZOLE 250 MG PO TABS: 250.0000 mg | ORAL_TABLET | Freq: Three times a day (TID) | ORAL | Status: DC

## 2014-12-18 NOTE — Progress Notes (Signed)
12/18/2014 Richard Burgess [id number] 03/08/1929   HISTORY OF PRESENT ILLNESS:  This is a pleasant 81 year old male who is known to Dr. Fuller Plan.  He had last colonoscopy in 08/2011 at which time he was found to have mild diverticulosis in the sigmoid colon and internal hemorrhoids.  Last EGD was 08/2009 at which time the study was normal but esophagus was empirically dilated for complaints of dysphagia.  He presents to our office today at the request of his PCP, Dr. Alain Marion, for evaluation regarding abdominal bloating.  He says that recently (fore the past couple of months) every time that he eats his abdomen feels bloated even with just a cup of coffee; on occasion he even feels bloated without eating.  Apparently he had a 10 pound weight loss as well, but he says that he's already gained 5 pounds of that back.  He denies absolutely any other GI complaints.  He denies any new medications.  CT scan of the abdomen and pelvis with contrast was unremarkable and labs including CBC, BMP, sed rate, lipase were all WNL's.  He is wondering if he has a parasite, but denies any diarrhea.   Past Medical History  Diagnosis Date  . Diverticulosis of colon   . Hypertention, malignant, with acute intensive management   . PVD (peripheral vascular disease)     Bilateral carotid  . Hx-TIA (transient ischemic attack)   . ED (erectile dysfunction)   . Glucose intolerance (impaired glucose tolerance)   . Hyperlipidemia   . GERD (gastroesophageal reflux disease)   . Allergy   . Osteoarthritis   . Low back pain   . History of pancreatitis   . BPH (benign prostatic hypertrophy)   . Tubular adenoma of colon 08/1991    One with carcinoma IN [facility] 1993, multiple adenomatous  . Hypertension   . Carotid artery occlusion    Past Surgical History  Procedure Laterality Date  . Appendectomy    . Cholecystectomy    . Basal cell carcinoma excision    . Lumbar spine surgery      X4  . Colonoscopy    .  Esophagogastroduodenoscopy      reports that he has quit smoking. His smoking use included Cigarettes. He quit after 20 years of use. He has never used smokeless tobacco. He reports that he drinks about 4.2 - 8.4 oz of alcohol per week. He reports that he does not use illicit drugs. family history includes Colon cancer in his sister; Dementia in his father; Diabetes in his mother; Peripheral vascular disease in his brother. Allergies  Allergen Reactions  . Oxycodone     crazy  . Pravastatin Sodium     REACTION: bad dreams  . Rosuvastatin     REACTION: palpitations  . Statins     REACTION: aches      Outpatient Encounter Prescriptions as of 12/18/2014  Medication Sig  . amLODipine (NORVASC) 5 MG tablet TAKE 1 TABLET DAILY.  Marland Kitchen aspirin 81 MG tablet Take 81 mg by mouth daily.    . Cholecalciferol (SM VITAMIN D3) 1000 UNITS tablet Take 1,000 Units by mouth daily.    . colesevelam (WELCHOL) 625 MG tablet Take 2 tablets (1,250 mg total) by mouth 2 (two) times daily with a meal.  . Cyanocobalamin (VITAMIN B-12) 1000 MCG SUBL Place 1 tablet (1,000 mcg total) under the tongue daily.  Marland Kitchen losartan (COZAAR) 100 MG tablet TAKE 1 TABLET EVERY DAY  . Multiple Vitamins-Minerals (VISION-VITE  PRESERVE PO) Take 1 tablet by mouth daily.   . pantoprazole (PROTONIX) 40 MG tablet Take 1 tablet (40 mg total) by mouth daily.  . Red Yeast Rice 600 MG CAPS Take 1 each by mouth 2 (two) times daily.  . sildenafil (VIAGRA) 100 MG tablet Take 1 tablet (100 mg total) by mouth as needed.  . traMADol (ULTRAM) 50 MG tablet TAKE 1 TABLET BY MOUTH EVERY 8 HOURS AS NEEDED  . triamcinolone ointment (KENALOG) 0.5 % Apply 1 application topically 2 (two) times daily.  Marland Kitchen zolpidem (AMBIEN) 5 MG tablet TAKE 1 TABLET BY MOUTH AT BEDTIME AS NEEDED FOR SLEEP  . fluticasone (FLONASE) 50 MCG/ACT nasal spray Place 2 sprays into the nose daily.  . metroNIDAZOLE (FLAGYL) 250 MG tablet Take 1 tablet (250 mg total) by mouth 3 (three) times  daily.   Facility-Administered Encounter Medications as of 12/18/2014  Medication  . methylPREDNISolone acetate (DEPO-MEDROL) injection 80 mg     REVIEW OF SYSTEMS  : All other systems reviewed and negative except where noted in the History of Present Illness.   PHYSICAL EXAM: BP 138/60 mmHg  Pulse 72  Ht 5\' 9"  (1.753 m)  Wt 205 lb 9.6 oz (93.26 kg)  BMI 30.35 kg/m2 General: Well developed white male in no acute distress Head: Normocephalic and atraumatic Eyes:  Sclerae anicteric, conjunctiva pink. Ears: Normal auditory acuity Lungs: Clear throughout to auscultation Heart: Regular rate and rhythm Abdomen: Soft, non-distended.  Normal bowel sounds  Non-tender. Musculoskeletal: Symmetrical with no gross deformities  Skin: No lesions on visible extremities Extremities: No edema  Neurological: Alert oriented x 4, grossly non-focal Psychological:  Alert and cooperative. Normal mood and affect  ASSESSMENT AND PLAN: -81 year old male with complaints only of abdominal bloating.  CT scan of abdomen and pelvis with contrast unremarkable and labs normal as well.  Will treat empirically for SIBO for now with one week course of flagyl 250 mg TID.  Will call in 2 weeks to provide [place] with an update on his symptoms.   CC:  Plotnikov, Evie Lacks, MD

## 2014-12-18 NOTE — Progress Notes (Signed)
Reviewed and agree with management plan.  Feige Lowdermilk T. Raijon Lindfors, MD FACG 

## 2014-12-18 NOTE — Patient Instructions (Signed)
We have sent the following medications to your pharmacy for you to pick up at your convenience:  Flagyl Call in 2 weeks to update on your condition.  [phone]

## 2014-12-22 ENCOUNTER — Ambulatory Visit (INDEPENDENT_AMBULATORY_CARE_PROVIDER_SITE_OTHER): Payer: Commercial Managed Care - HMO | Admitting: Internal Medicine

## 2014-12-22 ENCOUNTER — Encounter: Payer: Self-pay | Admitting: Internal Medicine

## 2014-12-22 VITALS — BP 130/80 | HR 81 | Resp 12 | Wt 209.0 lb

## 2014-12-22 DIAGNOSIS — L850 Acquired ichthyosis: Secondary | ICD-10-CM | POA: Diagnosis not present

## 2014-12-22 DIAGNOSIS — E538 Deficiency of other specified B group vitamins: Secondary | ICD-10-CM

## 2014-12-22 DIAGNOSIS — R51 Headache: Secondary | ICD-10-CM

## 2014-12-22 DIAGNOSIS — R519 Headache, unspecified: Secondary | ICD-10-CM | POA: Insufficient documentation

## 2014-12-22 DIAGNOSIS — L853 Xerosis cutis: Secondary | ICD-10-CM

## 2014-12-22 DIAGNOSIS — I10 Essential (primary) hypertension: Secondary | ICD-10-CM

## 2014-12-22 MED ORDER — GABAPENTIN 100 MG PO CAPS: 100.0000 mg | ORAL_CAPSULE | Freq: Four times a day (QID) | ORAL | Status: DC | PRN

## 2014-12-22 NOTE — Progress Notes (Signed)
Pre visit review using our clinic review tool, if applicable. No additional management support is needed unless otherwise documented below in the visit note. 

## 2014-12-22 NOTE — Assessment & Plan Note (Signed)
On Amlodipine 

## 2014-12-22 NOTE — Assessment & Plan Note (Signed)
Re-start B12 

## 2014-12-22 NOTE — Progress Notes (Addendum)
Subjective:  Patient ID: Richard Burgess, male    DOB: July 09, 1928  Age: 81 y.o. MRN: [id number]  CC: Neck Pain   HPI Richard Burgess presents for neck pain and HA on R side - stiff, painful off and on. His vision was blurred. HA was throbbing. [place] He had X rays of his C spine- ok. Feeling better now. F/u HTN, OA C/o rash on abd - "I have parasites" x 2 years  Outpatient Prescriptions Prior to Visit  Medication Sig Dispense Refill  . amLODipine (NORVASC) 5 MG tablet TAKE 1 TABLET DAILY. 90 tablet 3  . aspirin 81 MG tablet Take 81 mg by mouth daily.      . Cholecalciferol (SM VITAMIN D3) 1000 UNITS tablet Take 1,000 Units by mouth daily.      . colesevelam (WELCHOL) 625 MG tablet Take 2 tablets (1,250 mg total) by mouth 2 (two) times daily with a meal. 360 tablet 3  . Cyanocobalamin (VITAMIN B-12) 1000 MCG SUBL Place 1 tablet (1,000 mcg total) under the tongue daily. 100 tablet 3  . losartan (COZAAR) 100 MG tablet TAKE 1 TABLET EVERY DAY 90 tablet 3  . metroNIDAZOLE (FLAGYL) 250 MG tablet Take 1 tablet (250 mg total) by mouth 3 (three) times daily. 21 tablet 0  . Multiple Vitamins-Minerals (VISION-VITE PRESERVE PO) Take 1 tablet by mouth daily.     . pantoprazole (PROTONIX) 40 MG tablet Take 1 tablet (40 mg total) by mouth daily. 30 tablet 3  . Red Yeast Rice 600 MG CAPS Take 1 each by mouth 2 (two) times daily.    . sildenafil (VIAGRA) 100 MG tablet Take 1 tablet (100 mg total) by mouth as needed. 90 tablet 3  . traMADol (ULTRAM) 50 MG tablet TAKE 1 TABLET BY MOUTH EVERY 8 HOURS AS NEEDED 90 tablet 1  . triamcinolone ointment (KENALOG) 0.5 % Apply 1 application topically 2 (two) times daily. 60 g 1  . zolpidem (AMBIEN) 5 MG tablet TAKE 1 TABLET BY MOUTH AT BEDTIME AS NEEDED FOR SLEEP 30 tablet 3  . fluticasone (FLONASE) 50 MCG/ACT nasal spray Place 2 sprays into the nose daily. 16 g 6   Facility-Administered Medications Prior to Visit  Medication Dose  Route Frequency Provider Last Rate Last Dose  . methylPREDNISolone acetate (DEPO-MEDROL) injection 80 mg  80 mg Intra-articular Once Lile Mccurley V, MD        ROS Review of Systems  Constitutional: Negative for appetite change, fatigue and unexpected weight change.  HENT: Negative for congestion, nosebleeds, sneezing, sore throat and trouble swallowing.   Eyes: Negative for itching and visual disturbance.  Respiratory: Negative for cough.   Cardiovascular: Negative for chest pain, palpitations and leg swelling.  Gastrointestinal: Negative for nausea, diarrhea, blood in stool and abdominal distention.  Genitourinary: Negative for frequency and hematuria.  Musculoskeletal: Positive for neck pain. Negative for back pain, joint swelling and gait problem.  Skin: Negative for rash.  Neurological: Positive for headaches. Negative for dizziness, tremors, speech difficulty and weakness.  Psychiatric/Behavioral: Negative for suicidal ideas, sleep disturbance, dysphoric mood and agitation. The patient is not nervous/anxious.     Objective:  BP 130/80 mmHg  Pulse 81  Resp 12  Wt 209 lb (94.802 kg)  SpO2 94%  BP Readings from Last 3 Encounters:  12/22/14 130/80  12/18/14 138/60  11/17/14 118/66    Wt Readings from Last 3 Encounters:  12/22/14 209 lb (94.802 kg)  12/18/14 205 lb 9.6 oz (93.26 kg)  11/17/14 204 lb (92.534 kg)    Physical Exam  Constitutional: He is oriented to person, place, and time. He appears well-developed. No distress.  NAD  HENT:  Mouth/Throat: Oropharynx is clear and moist.  Eyes: Conjunctivae are normal. Pupils are equal, round, and reactive to light.  Neck: Normal range of motion. No JVD present. No thyromegaly present.  Cardiovascular: Normal rate, regular rhythm, normal heart sounds and intact distal pulses.  Exam reveals no gallop and no friction rub.   No murmur heard. Pulmonary/Chest: Effort normal and breath sounds normal. No respiratory distress.  He has no wheezes. He has no rales. He exhibits no tenderness.  Abdominal: Soft. Bowel sounds are normal. He exhibits no distension and no mass. There is no tenderness. There is no rebound and no guarding.  Musculoskeletal: Normal range of motion. He exhibits tenderness. He exhibits no edema.  Lymphadenopathy:    He has no cervical adenopathy.  Neurological: He is alert and oriented to person, place, and time. He has normal reflexes. No cranial nerve deficit. He exhibits normal muscle tone. He displays a negative Romberg sign. Coordination and gait normal.  Skin: Skin is warm and dry. No rash noted.  Psychiatric: He has a normal mood and affect. His behavior is normal. Judgment and thought content normal.  R occip nerve area is sensitive on R CN, DTRs - nonfocal abd w/pap rash and excoriations  Lab Results  Component Value Date   WBC 7.7 10/30/2014   HGB 14.8 10/30/2014   HCT 43.3 10/30/2014   PLT 198.0 10/30/2014   GLUCOSE 119* 10/30/2014   CHOL 224* 08/01/2013   TRIG 183.0* 08/01/2013   HDL 40.60 08/01/2013   LDLDIRECT 164.5 08/01/2013   LDLCALC 91 04/22/2010   ALT 22 08/01/2013   AST 20 08/01/2013   NA 137 10/30/2014   K 4.7 10/30/2014   CL 103 10/30/2014   CREATININE 1.02 10/30/2014   BUN 19 10/30/2014   CO2 26 10/30/2014   TSH 2.51 01/27/2014   PSA 0.28 03/23/2014   HGBA1C 6.5 08/01/2013   MICROALBUR 0.2 12/13/2007    Ct Abdomen Pelvis W Contrast  11/15/2014   CLINICAL DATA:  Generalized abdominal pain and distention for 3 weeks. Unintentional weight loss of approximately 10 lb. Diverticulosis. Personal history of pancreatitis.  EXAM: CT ABDOMEN AND PELVIS WITH CONTRAST  TECHNIQUE: Multidetector CT imaging of the abdomen and pelvis was performed using the standard protocol following bolus administration of intravenous contrast.  CONTRAST:  139mL OMNIPAQUE IOHEXOL 300 MG/ML  SOLN  COMPARISON:  01/03/2012  FINDINGS: Lower Chest:  Unremarkable.  Hepatobiliary: No masses or  other significant abnormality identified. Prior cholecystectomy noted. No evidence of biliary dilatation.  Pancreas: No mass, inflammatory changes, or other significant abnormality identified.  Spleen:  Within normal limits in size and appearance.  Adrenals:  No masses identified.  Kidneys/Urinary Tract:  No evidence of masses or hydronephrosis.  Stomach/Bowel/Peritoneum: No evidence of wall thickening, mass, or obstruction. Normal appendix visualized.  Vascular/Lymphatic: No pathologically enlarged lymph nodes identified. No other significant abnormality visualized.  Reproductive:  No mass or other significant abnormality identified.  Other:  None.  Musculoskeletal: No suspicious bone lesions identified. Severe lower lumbar degenerative spondylosis noted.  IMPRESSION: No acute findings or other significant abnormality identified within the abdomen or pelvis.  Severe lower lumbar degenerative spondylosis incidentally noted.   Electronically Signed   By: Earle Gell M.D.   On: 11/15/2014 16:21  Assessment & Plan:   There are no diagnoses linked to this encounter. I am having Mr. Soeder maintain his aspirin, Cholecalciferol, fluticasone, Vitamin B-12, colesevelam, sildenafil, Red Yeast Rice, triamcinolone ointment, Multiple Vitamins-Minerals (VISION-VITE PRESERVE PO), amLODipine, losartan, traMADol, pantoprazole, zolpidem, metroNIDAZOLE, and traZODone. We will continue to administer methylPREDNISolone acetate.  Meds ordered this encounter  Medications  . traZODone (DESYREL) 50 MG tablet    Sig:      Follow-up: No Follow-up on file.  [name], MD

## 2014-12-22 NOTE — Addendum Note (Signed)
Addended by: Cassandria Anger on: 12/22/2014 08:56 AM   Modules accepted: Orders

## 2014-12-22 NOTE — Patient Instructions (Signed)
Go to ER if worse 

## 2014-12-22 NOTE — Assessment & Plan Note (Signed)
R knee, abdomen Derm ref Use Kenalog

## 2014-12-22 NOTE — Assessment & Plan Note (Signed)
R sided Severe  Nerve block offered CT head Gabapentin prn

## 2014-12-26 ENCOUNTER — Telehealth: Payer: Self-pay | Admitting: Internal Medicine

## 2014-12-26 NOTE — Telephone Encounter (Signed)
Patient need a referral to the dermatologist, Dr Allyn Kenner appt time July 26 @ 3:20

## 2014-12-27 NOTE — Telephone Encounter (Signed)
Sent to [employer] online, awaiting approval

## 2015-01-01 ENCOUNTER — Telehealth: Payer: Self-pay | Admitting: Internal Medicine

## 2015-01-01 MED ORDER — PANTOPRAZOLE SODIUM 40 MG PO TBEC: 40.0000 mg | DELAYED_RELEASE_TABLET | Freq: Every day | ORAL | Status: DC

## 2015-01-01 NOTE — Telephone Encounter (Signed)
Patient needs refills for pantoprazole (PROTONIX) 40 MG tablet [[id number] sent to [facility], please

## 2015-01-01 NOTE — Telephone Encounter (Signed)
Done. See meds. Pt's wife informed.

## 2015-01-03 ENCOUNTER — Ambulatory Visit (INDEPENDENT_AMBULATORY_CARE_PROVIDER_SITE_OTHER)
Admission: RE | Admit: 2015-01-03 | Discharge: 2015-01-03 | Disposition: A | Discharge status: Home / Self Care | Payer: Commercial Managed Care - HMO | Source: Ambulatory Visit | Attending: Internal Medicine | Admitting: Internal Medicine

## 2015-01-03 DIAGNOSIS — R51 Headache: Secondary | ICD-10-CM | POA: Diagnosis not present

## 2015-02-08 ENCOUNTER — Other Ambulatory Visit: Payer: Self-pay | Admitting: Internal Medicine

## 2015-02-19 ENCOUNTER — Ambulatory Visit (INDEPENDENT_AMBULATORY_CARE_PROVIDER_SITE_OTHER): Payer: Commercial Managed Care - HMO | Admitting: Internal Medicine

## 2015-02-19 ENCOUNTER — Encounter: Payer: Self-pay | Admitting: Internal Medicine

## 2015-02-19 VITALS — BP 110/52 | HR 76 | Wt 206.0 lb

## 2015-02-19 DIAGNOSIS — I10 Essential (primary) hypertension: Secondary | ICD-10-CM

## 2015-02-19 DIAGNOSIS — M25561 Pain in right knee: Secondary | ICD-10-CM

## 2015-02-19 DIAGNOSIS — R634 Abnormal weight loss: Secondary | ICD-10-CM | POA: Diagnosis not present

## 2015-02-19 DIAGNOSIS — E538 Deficiency of other specified B group vitamins: Secondary | ICD-10-CM | POA: Diagnosis not present

## 2015-02-19 MED ORDER — METHYLPREDNISOLONE ACETATE 80 MG/ML IJ SUSP
80.0000 mg | Freq: Once | INTRAMUSCULAR | Status: AC
  Administered 2015-02-19: 80 mg via INTRA_ARTICULAR

## 2015-02-19 NOTE — Assessment & Plan Note (Signed)
On Amlodipine 

## 2015-02-19 NOTE — Progress Notes (Addendum)
Subjective:  Patient ID: Richard Burgess, male    DOB: [birth year]  Age: 81 y.o. MRN: [id number]  CC: No chief complaint on file.   HPI RYDEN WAINER presents for HTN, B12 def, abd pain - resolved. No wt loss lately. C/o R knee pain  Outpatient Prescriptions Prior to Visit  Medication Sig Dispense Refill  . amLODipine (NORVASC) 5 MG tablet TAKE 1 TABLET DAILY. 90 tablet 3  . aspirin 81 MG tablet Take 81 mg by mouth daily.      . Cholecalciferol (SM VITAMIN D3) 1000 UNITS tablet Take 1,000 Units by mouth daily.      . colesevelam (WELCHOL) 625 MG tablet Take 2 tablets (1,250 mg total) by mouth 2 (two) times daily with a meal. 360 tablet 3  . Cyanocobalamin (VITAMIN B-12) 1000 MCG SUBL Place 1 tablet (1,000 mcg total) under the tongue daily. 100 tablet 3  . gabapentin (NEURONTIN) 100 MG capsule Take 1 capsule (100 mg total) by mouth 4 (four) times daily as needed (neck pain, headache). 100 capsule 1  . losartan (COZAAR) 100 MG tablet TAKE 1 TABLET EVERY DAY 90 tablet 3  . Multiple Vitamins-Minerals (VISION-VITE PRESERVE PO) Take 1 tablet by mouth daily.     . pantoprazole (PROTONIX) 40 MG tablet Take 1 tablet (40 mg total) by mouth daily. 90 tablet 3  . Red Yeast Rice 600 MG CAPS Take 1 each by mouth 2 (two) times daily.    . sildenafil (VIAGRA) 100 MG tablet Take 1 tablet (100 mg total) by mouth as needed. 90 tablet 3  . traMADol (ULTRAM) 50 MG tablet TAKE 1 TABLET BY MOUTH EVERY 8 HOURS AS NEEDED 90 tablet 2  . traZODone (DESYREL) 50 MG tablet     . triamcinolone ointment (KENALOG) 0.5 % Apply 1 application topically 2 (two) times daily. 60 g 1  . zolpidem (AMBIEN) 5 MG tablet TAKE 1 TABLET BY MOUTH AT BEDTIME AS NEEDED FOR SLEEP 30 tablet 3  . metroNIDAZOLE (FLAGYL) 250 MG tablet Take 1 tablet (250 mg total) by mouth 3 (three) times daily. 21 tablet 0  . fluticasone (FLONASE) 50 MCG/ACT nasal spray Place 2 sprays into the nose daily. 16 g 6   Facility-Administered Medications Prior to  Visit  Medication Dose Route Frequency Provider Last Rate Last Dose  . methylPREDNISolone acetate (DEPO-MEDROL) injection 80 mg  80 mg Intra-articular Once Aleksei Plotnikov V, MD        ROS Review of Systems  Constitutional: Negative for appetite change, fatigue and unexpected weight change.  HENT: Negative for congestion, nosebleeds, sneezing, sore throat and trouble swallowing.   Eyes: Negative for itching and visual disturbance.  Respiratory: Negative for cough.   Cardiovascular: Negative for chest pain, palpitations and leg swelling.  Gastrointestinal: Negative for nausea, diarrhea, blood in stool and abdominal distention.  Genitourinary: Negative for frequency and hematuria.  Musculoskeletal: Positive for arthralgias and gait problem. Negative for back pain, joint swelling and neck pain.  Skin: Negative for rash.  Neurological: Negative for dizziness, tremors, speech difficulty and weakness.  Psychiatric/Behavioral: Negative for sleep disturbance, dysphoric mood and agitation. The patient is not nervous/anxious.     Objective:  BP 110/52 mmHg  Pulse 76  Wt 206 lb (93.441 kg)  SpO2 92%  BP Readings from Last 3 Encounters:  02/19/15 110/52  12/22/14 130/80  12/18/14 138/60    Wt Readings from Last 3 Encounters:  02/19/15 206 lb (93.441 kg)  12/22/14 209 lb (94.802 kg)  12/18/14 205 lb 9.6 oz (93.26 kg)    Physical Exam  Constitutional: He is oriented to person, place, and time. He appears well-developed. No distress.  NAD  HENT:  Mouth/Throat: Oropharynx is clear and moist.  Eyes: Conjunctivae are normal. Pupils are equal, round, and reactive to light.  Neck: Normal range of motion. No JVD present. No thyromegaly present.  Cardiovascular: Normal rate, regular rhythm, normal heart sounds and intact distal pulses.  Exam reveals no gallop and no friction rub.   No murmur heard. Pulmonary/Chest: Effort normal and breath sounds normal. No respiratory distress. He has no  wheezes. He has no rales. He exhibits no tenderness.  Abdominal: Soft. Bowel sounds are normal. He exhibits no distension and no mass. There is no tenderness. There is no rebound and no guarding.  Musculoskeletal: Normal range of motion. He exhibits no edema or tenderness.  Lymphadenopathy:    He has no cervical adenopathy.  Neurological: He is alert and oriented to person, place, and time. He has normal reflexes. No cranial nerve deficit. He exhibits normal muscle tone. He displays a negative Romberg sign. Coordination and gait normal.  Skin: Skin is warm and dry. No rash noted.  Psychiatric: His behavior is normal. Judgment and thought content normal.  R knee is tender w/ROM  Lab Results  Component Value Date   WBC 7.7 10/30/2014   HGB 14.8 10/30/2014   HCT 43.3 10/30/2014   PLT 198.0 10/30/2014   GLUCOSE 119* 10/30/2014   CHOL 224* 08/01/2013   TRIG 183.0* 08/01/2013   HDL 40.60 08/01/2013   LDLDIRECT 164.5 08/01/2013   LDLCALC 91 04/22/2010   ALT 22 08/01/2013   AST 20 08/01/2013   NA 137 10/30/2014   K 4.7 10/30/2014   CL 103 10/30/2014   CREATININE 1.02 10/30/2014   BUN 19 10/30/2014   CO2 26 10/30/2014   TSH 2.51 01/27/2014   PSA 0.28 03/23/2014   HGBA1C 6.5 08/01/2013   MICROALBUR 0.2 12/13/2007    Ct Head Wo Contrast  01/03/2015   CLINICAL DATA:  Occipital headache for 2 weeks, right-sided, some blurred vision  EXAM: CT HEAD WITHOUT CONTRAST  TECHNIQUE: Contiguous axial images were obtained from the base of the skull through the vertex without intravenous contrast.  COMPARISON:  CT brain scan of 01/03/2012  FINDINGS: The ventricular system is unchanged in size and configuration with only mild cortical atrophy noted. The septum remains midline in position. Minimal small vessel ischemic change is stable. No hemorrhage, mass lesion, or acute infarction is seen. Slightly prominent bifrontal CSF spaces are stable. On bone window images, the paranasal sinuses are pneumatized.  No calvarial abnormality is seen.  IMPRESSION: 1. No acute intracranial abnormality. 2. Atrophy and mild small vessel ischemic change.   Electronically Signed   By: Ivar Drape M.D.   On: 01/03/2015 10:08    Procedure Note :     Procedure : Joint Injection, R  knee   Indication:  Joint osteoarthritis with refractory  chronic pain.   Risks including unsuccessful procedure , bleeding, infection, bruising, skin atrophy, "steroid flare-up" and others were explained to the patient in detail as well as the benefits. Informed consent was obtained and signed.   Tthe patient was placed in a comfortable position. Lateral approach was used. Skin was prepped with Betadine and alcohol  and anesthetized a cooling spray. Then, a 5 cc syringe with a 1.5 inch long 25-gauge needle was used for a joint injection.. The needle was advanced  Into  the knee joint cavity. I aspirated a small amount of intra-articular fluid to confirm correct placement of the needle and injected the joint with 5 mL of 2% lidocaine and 80 mg of Depo-Medrol .  Band-Aid was applied.   Tolerated well. Complications: None. Good pain relief following the procedure.   Postprocedure instructions :    A Band-Aid should be left on for 12 hours. Injection therapy is not a cure itself. It is used in conjunction with other modalities. You can use nonsteroidal anti-inflammatories like ibuprofen , hot and cold compresses. Rest is recommended in the next 24 hours. You need to report immediately  if fever, chills or any signs of infection develop.  Assessment & Plan:   There are no diagnoses linked to this encounter. I have discontinued Mr. Bieler metroNIDAZOLE. I am also having him maintain his aspirin, Cholecalciferol, fluticasone, Vitamin B-12, colesevelam, sildenafil, Red Yeast Rice, triamcinolone ointment, Multiple Vitamins-Minerals (VISION-VITE PRESERVE PO), amLODipine, losartan, zolpidem, traZODone, gabapentin, pantoprazole, traMADol,  terbinafine, and triamcinolone cream. We will continue to administer methylPREDNISolone acetate.  Meds ordered this encounter  Medications  . terbinafine (LAMISIL) 250 MG tablet    Sig: daily.    Refill:  0  . triamcinolone cream (KENALOG) 0.1 %    Sig: APPLY TO AFFECTED AREA UP TO TWICE A DAY AS NEEDED NOT TO FACE GROIN UNDERARMS    Refill:  3     Follow-up: No Follow-up on file.  [name], MD

## 2015-02-19 NOTE — Assessment & Plan Note (Signed)
On B12 

## 2015-02-19 NOTE — Progress Notes (Signed)
Pre visit review using our clinic review tool, if applicable. No additional management support is needed unless otherwise documented below in the visit note. 

## 2015-02-19 NOTE — Addendum Note (Signed)
Addended by: Cassandria Anger on: 02/19/2015 08:54 AM   Modules accepted: Orders

## 2015-02-19 NOTE — Assessment & Plan Note (Signed)
Will inject °

## 2015-02-19 NOTE — Assessment & Plan Note (Signed)
Resolved. S/p GI eval

## 2015-05-22 ENCOUNTER — Ambulatory Visit (INDEPENDENT_AMBULATORY_CARE_PROVIDER_SITE_OTHER): Payer: Commercial Managed Care - HMO | Admitting: Internal Medicine

## 2015-05-22 ENCOUNTER — Encounter: Payer: Self-pay | Admitting: Internal Medicine

## 2015-05-22 ENCOUNTER — Other Ambulatory Visit (INDEPENDENT_AMBULATORY_CARE_PROVIDER_SITE_OTHER): Payer: Commercial Managed Care - HMO

## 2015-05-22 ENCOUNTER — Telehealth: Payer: Self-pay | Admitting: Internal Medicine

## 2015-05-22 VITALS — BP 122/62 | HR 74 | Wt 212.0 lb

## 2015-05-22 DIAGNOSIS — I10 Essential (primary) hypertension: Secondary | ICD-10-CM

## 2015-05-22 DIAGNOSIS — I739 Peripheral vascular disease, unspecified: Secondary | ICD-10-CM | POA: Diagnosis not present

## 2015-05-22 DIAGNOSIS — E538 Deficiency of other specified B group vitamins: Secondary | ICD-10-CM | POA: Diagnosis not present

## 2015-05-22 DIAGNOSIS — I6521 Occlusion and stenosis of right carotid artery: Secondary | ICD-10-CM | POA: Diagnosis not present

## 2015-05-22 DIAGNOSIS — G459 Transient cerebral ischemic attack, unspecified: Secondary | ICD-10-CM

## 2015-05-22 DIAGNOSIS — K219 Gastro-esophageal reflux disease without esophagitis: Secondary | ICD-10-CM

## 2015-05-22 LAB — BASIC METABOLIC PANEL
BUN: 24 mg/dL — ABNORMAL HIGH (ref 6–23)
CHLORIDE: 104 meq/L (ref 96–112)
CO2: 26 meq/L (ref 19–32)
Calcium: 9.7 mg/dL (ref 8.4–10.5)
Creatinine, Ser: 1.2 mg/dL (ref 0.40–1.50)
GFR: 61.24 mL/min (ref >60.00)
GLUCOSE: 128 mg/dL — AB (ref 70–99)
POTASSIUM: 4.2 meq/L (ref 3.5–5.1)
SODIUM: 138 meq/L (ref 135–145)

## 2015-05-22 MED ORDER — GABAPENTIN 100 MG PO CAPS: 100.0000 mg | ORAL_CAPSULE | Freq: Four times a day (QID) | ORAL | Status: DC | PRN

## 2015-05-22 NOTE — Assessment & Plan Note (Signed)
On Protonix 

## 2015-05-22 NOTE — Progress Notes (Signed)
Subjective:  Patient ID: Richard Burgess, male    DOB: August 27, 1928  Age: 81 y.o. MRN: [id number]  CC: No chief complaint on file.   HPI [name] presents for dyslipidemia, HTN, CAD f/u.  Outpatient Prescriptions Prior to Visit  Medication Sig Dispense Refill  . amLODipine (NORVASC) 5 MG tablet TAKE 1 TABLET DAILY. 90 tablet 3  . aspirin 81 MG tablet Take 81 mg by mouth daily.      . Cholecalciferol (SM VITAMIN D3) 1000 UNITS tablet Take 1,000 Units by mouth daily.      . colesevelam (WELCHOL) 625 MG tablet Take 2 tablets (1,250 mg total) by mouth 2 (two) times daily with a meal. 360 tablet 3  . Cyanocobalamin (VITAMIN B-12) 1000 MCG SUBL Place 1 tablet (1,000 mcg total) under the tongue daily. 100 tablet 3  . gabapentin (NEURONTIN) 100 MG capsule Take 1 capsule (100 mg total) by mouth 4 (four) times daily as needed (neck pain, headache). 100 capsule 1  . losartan (COZAAR) 100 MG tablet TAKE 1 TABLET EVERY DAY 90 tablet 3  . Multiple Vitamins-Minerals (VISION-VITE PRESERVE PO) Take 1 tablet by mouth daily.     . pantoprazole (PROTONIX) 40 MG tablet Take 1 tablet (40 mg total) by mouth daily. 90 tablet 3  . Red Yeast Rice 600 MG CAPS Take 1 each by mouth 2 (two) times daily.    . sildenafil (VIAGRA) 100 MG tablet Take 1 tablet (100 mg total) by mouth as needed. 90 tablet 3  . terbinafine (LAMISIL) 250 MG tablet daily.  0  . traMADol (ULTRAM) 50 MG tablet TAKE 1 TABLET BY MOUTH EVERY 8 HOURS AS NEEDED 90 tablet 2  . traZODone (DESYREL) 50 MG tablet     . triamcinolone cream (KENALOG) 0.1 % APPLY TO AFFECTED AREA UP TO TWICE A DAY AS NEEDED NOT TO FACE GROIN UNDERARMS  3  . triamcinolone ointment (KENALOG) 0.5 % Apply 1 application topically 2 (two) times daily. 60 g 1  . zolpidem (AMBIEN) 5 MG tablet TAKE 1 TABLET BY MOUTH AT BEDTIME AS NEEDED FOR SLEEP 30 tablet 3  . fluticasone (FLONASE) 50 MCG/ACT nasal spray Place 2 sprays into the nose daily. 16 g 6   Facility-Administered  Medications Prior to Visit  Medication Dose Route Frequency Provider Last Rate Last Dose  . methylPREDNISolone acetate (DEPO-MEDROL) injection 80 mg  80 mg Intra-articular Once Covey Baller V, MD        ROS Review of Systems  Constitutional: Negative for appetite change, fatigue and unexpected weight change.  HENT: Negative for congestion, nosebleeds, sneezing, sore throat and trouble swallowing.   Eyes: Negative for itching and visual disturbance.  Respiratory: Negative for cough.   Cardiovascular: Negative for chest pain, palpitations and leg swelling.  Gastrointestinal: Negative for nausea, diarrhea, blood in stool and abdominal distention.  Genitourinary: Negative for frequency and hematuria.  Musculoskeletal: Positive for arthralgias. Negative for back pain, joint swelling, gait problem and neck pain.  Skin: Negative for rash.  Neurological: Negative for dizziness, tremors, speech difficulty and weakness.  Psychiatric/Behavioral: Negative for sleep disturbance, dysphoric mood and agitation. The patient is not nervous/anxious.     Objective:  BP 122/62 mmHg  Pulse 74  Wt 212 lb (96.163 kg)  SpO2 94%  BP Readings from Last 3 Encounters:  05/22/15 122/62  02/19/15 110/52  12/22/14 130/80    Wt Readings from Last 3 Encounters:  05/22/15 212 lb (96.163 kg)  02/19/15 206 lb (93.441 kg)  12/22/14 209 lb (94.802 kg)    Physical Exam  Constitutional: He is oriented to person, place, and time. He appears well-developed. No distress.  NAD  HENT:  Mouth/Throat: Oropharynx is clear and moist.  Eyes: Conjunctivae are normal. Pupils are equal, round, and reactive to light.  Neck: Normal range of motion. No JVD present. No thyromegaly present.  Cardiovascular: Normal rate, regular rhythm, normal heart sounds and intact distal pulses.  Exam reveals no gallop and no friction rub.   No murmur heard. Pulmonary/Chest: Effort normal and breath sounds normal. No respiratory  distress. He has no wheezes. He has no rales. He exhibits no tenderness.  Abdominal: Soft. Bowel sounds are normal. He exhibits no distension and no mass. There is no tenderness. There is no rebound and no guarding.  Musculoskeletal: Normal range of motion. He exhibits no edema or tenderness.  Lymphadenopathy:    He has no cervical adenopathy.  Neurological: He is alert and oriented to person, place, and time. He has normal reflexes. No cranial nerve deficit. He exhibits normal muscle tone. He displays a negative Romberg sign. Coordination and gait normal.  Skin: Skin is warm and dry. No rash noted.  Psychiatric: He has a normal mood and affect. His behavior is normal. Judgment and thought content normal.  Obese  Lab Results  Component Value Date   WBC 7.7 10/30/2014   HGB 14.8 10/30/2014   HCT 43.3 10/30/2014   PLT 198.0 10/30/2014   GLUCOSE 119* 10/30/2014   CHOL 224* 08/01/2013   TRIG 183.0* 08/01/2013   HDL 40.60 08/01/2013   LDLDIRECT 164.5 08/01/2013   LDLCALC 91 04/22/2010   ALT 22 08/01/2013   AST 20 08/01/2013   NA 137 10/30/2014   K 4.7 10/30/2014   CL 103 10/30/2014   CREATININE 1.02 10/30/2014   BUN 19 10/30/2014   CO2 26 10/30/2014   TSH 2.51 01/27/2014   PSA 0.28 03/23/2014   HGBA1C 6.5 08/01/2013   MICROALBUR 0.2 12/13/2007    Ct Head Wo Contrast  01/03/2015  CLINICAL DATA:  Occipital headache for 2 weeks, right-sided, some blurred vision EXAM: CT HEAD WITHOUT CONTRAST TECHNIQUE: Contiguous axial images were obtained from the base of the skull through the vertex without intravenous contrast. COMPARISON:  CT brain scan of 01/03/2012 FINDINGS: The ventricular system is unchanged in size and configuration with only mild cortical atrophy noted. The septum remains midline in position. Minimal small vessel ischemic change is stable. No hemorrhage, mass lesion, or acute infarction is seen. Slightly prominent bifrontal CSF spaces are stable. On bone window images, the  paranasal sinuses are pneumatized. No calvarial abnormality is seen. IMPRESSION: 1. No acute intracranial abnormality. 2. Atrophy and mild small vessel ischemic change. Electronically Signed   By: Ivar Drape M.D.   On: 01/03/2015 10:08    Assessment & Plan:   There are no diagnoses linked to this encounter. I am having Mr. Bunce maintain his aspirin, Cholecalciferol, fluticasone, Vitamin B-12, colesevelam, sildenafil, Red Yeast Rice, triamcinolone ointment, Multiple Vitamins-Minerals (VISION-VITE PRESERVE PO), amLODipine, losartan, zolpidem, traZODone, gabapentin, pantoprazole, traMADol, terbinafine, and triamcinolone cream. We will continue to administer methylPREDNISolone acetate.  No orders of the defined types were placed in this encounter.     Follow-up: No Follow-up on file.  [name], MD

## 2015-05-22 NOTE — Assessment & Plan Note (Signed)
On Amlodipine, Losartan, ASA, red rice yeast

## 2015-05-22 NOTE — Assessment & Plan Note (Signed)
Remote: On Amlodipine, Losartan, ASA, red rice yeast

## 2015-05-22 NOTE — Assessment & Plan Note (Addendum)
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

## 2015-05-22 NOTE — Telephone Encounter (Signed)
Pt wife called in and said that pt needs refill on his  gabapentin (NEURONTIN) 100 MG capsule [18309]       gabapentin (NEURONTIN) 100 MG capsule [id number]     Please send to mail order pharmacy. [employer]

## 2015-05-22 NOTE — Assessment & Plan Note (Signed)
On B12 

## 2015-05-22 NOTE — Assessment & Plan Note (Signed)
On Amlodipine, Losartan °

## 2015-05-22 NOTE — Progress Notes (Signed)
Pre visit review using our clinic review tool, if applicable. No additional management support is needed unless otherwise documented below in the visit note. 

## 2015-07-09 ENCOUNTER — Telehealth: Payer: Self-pay | Admitting: Internal Medicine

## 2015-07-09 MED ORDER — PANTOPRAZOLE SODIUM 40 MG PO TBEC: 40.0000 mg | DELAYED_RELEASE_TABLET | Freq: Every day | ORAL | Status: DC

## 2015-07-09 MED ORDER — AMLODIPINE BESYLATE 5 MG PO TABS: 5.0000 mg | ORAL_TABLET | Freq: Every day | ORAL | Status: DC

## 2015-07-09 NOTE — Telephone Encounter (Signed)
He needs 2 BP meds Thx

## 2015-07-09 NOTE — Telephone Encounter (Signed)
Pt came by office to update his ins for 2017.  Pt now has [employer].  He also requested refills on Pantoprazole DR 40mg  tab& Amlodipine 5 mg tab.  Pt is requesting 90 day refills on both meds.  Pt is also wanting to know why he is on 2 BP meds??  Please call him with that answer [phone].  He uses [facility] on [address].

## 2015-07-09 NOTE — Telephone Encounter (Signed)
Pt erx has been sent.   Plot[place] - can you answer the question why pt is on 2 bp meds.

## 2015-07-10 MED ORDER — LOSARTAN POTASSIUM 100 MG PO TABS: 100.0000 mg | ORAL_TABLET | Freq: Every day | ORAL | Status: DC

## 2015-07-10 NOTE — Telephone Encounter (Signed)
Contacted pt and spoke to wife. Informed of the below information. Wife stated understanding.   Amlodipine is a calcium channel blocker that dilates (widens) blood vessels and improves blood flow. Amlodipine is used to treat chest pain (angina) and other conditions caused by coronary artery disease. Losartan (Cozaar) belongs to a group of drugs called angiotensin II receptor antagonists. It keeps blood vessels from narrowing, which lowers blood pressure and improves blood flow.

## 2015-07-23 ENCOUNTER — Encounter (HOSPITAL_COMMUNITY): Payer: Commercial Managed Care - HMO

## 2015-07-23 ENCOUNTER — Ambulatory Visit: Payer: Commercial Managed Care - HMO | Admitting: Family

## 2015-07-24 ENCOUNTER — Encounter: Payer: Self-pay | Admitting: Family

## 2015-07-25 ENCOUNTER — Telehealth: Payer: Self-pay | Admitting: Gastroenterology

## 2015-07-25 NOTE — Telephone Encounter (Signed)
Patient wife states patient is having constipation.  He is taking OTC products and fiber with little relief.  She is advised to have him start Miralax 1-2 times a day and increase the amount of fluid in his diet.  I also scheduled a office follow up for 08/06/15 3:00 with Dr. Fuller Plan

## 2015-07-31 ENCOUNTER — Ambulatory Visit: Payer: Commercial Managed Care - HMO | Admitting: Family

## 2015-07-31 ENCOUNTER — Encounter (HOSPITAL_COMMUNITY): Payer: Commercial Managed Care - HMO

## 2015-08-06 ENCOUNTER — Encounter: Payer: Self-pay | Admitting: Gastroenterology

## 2015-08-06 ENCOUNTER — Ambulatory Visit (INDEPENDENT_AMBULATORY_CARE_PROVIDER_SITE_OTHER): Payer: PPO | Admitting: Gastroenterology

## 2015-08-06 VITALS — BP 130/70 | HR 80 | Ht 69.0 in | Wt 211.1 lb

## 2015-08-06 DIAGNOSIS — K645 Perianal venous thrombosis: Secondary | ICD-10-CM

## 2015-08-06 DIAGNOSIS — K5900 Constipation, unspecified: Secondary | ICD-10-CM | POA: Diagnosis not present

## 2015-08-06 NOTE — Progress Notes (Signed)
    History of Present Illness: This is an 82 year old male here for the evaluation of constipation and a "knot near his rectum". He noted the "knot" about 2 weeks ago and it has decreased in size. Mild pain initially however no longer notes any pain. He was evaluated by his PCP at the [facility] facility for these symptoms and given a powder laxative and a suppository and his symptoms have improved. Unfortunately I do not have records from that visit. Last colonoscopy in 08/2011 showed internal hemorrhoids and mild sigmoid diverticulosis. Denies weight loss, abdominal pain, diarrhea, change in stool caliber, melena, hematochezia, nausea, vomiting, dysphagia, reflux symptoms, chest pain.  Review of Systems: Pertinent positive and negative review of systems were noted in the above HPI section. All other review of systems were otherwise negative.  Current Medications, Allergies, Past Medical History, Past Surgical History, Family History and Social History were reviewed in [employer] record.  Physical Exam: General: Well developed, well nourished, no acute distress Head: Normocephalic and atraumatic Eyes:  sclerae anicteric, EOMI Ears: Normal auditory acuity Mouth: No deformity or lesions Neck: Supple, no masses or thyromegaly Lungs: Clear throughout to auscultation Heart: Regular rate and rhythm; no murmurs, rubs or bruits Abdomen: Soft, non tender and non distended. No masses, hepatosplenomegaly or hernias noted. Normal Bowel sounds Rectal: thrombosed external hemorrhoid, no internal lesions, heme neg stool  Musculoskeletal: Symmetrical with no gross deformities  Skin: No lesions on visible extremities Pulses:  Normal pulses noted Extremities: No clubbing, cyanosis, edema or deformities noted Neurological: Alert oriented x 4, grossly nonfocal Cervical Nodes:  No significant cervical adenopathy Inguinal Nodes: No significant inguinal adenopathy Psychological:   Alert and cooperative. Normal mood and affect  Assessment and Recommendations:  1. Thrombosed external hemorrhoid. Rectal care instructions. Anusol HC cream twice a day when necessary. Is advised to call our office if this does not completely resolve within the next 4-6 weeks.  2. Constipation. High-fiber diet with adequate daily water intake. Continue GlycoLax as prescribed by his [place].   3. Personal history of colon cancer in situ in a colon polyp and multiple adenomatous colon polyps. Last colonoscopy in 2013 did not show polyps. No plans for future surveillance colonoscopies due to age.

## 2015-08-06 NOTE — Patient Instructions (Signed)
RECTAL CARE INSTRUCTIONS:  1. Sitz Baths twice a day for 10 minutes each. 2. Thoroughly clean and dry the rectum. 3. Put Tucks pad against the rectum at night. 4. Clean the rectum with Balenol lotion after each bowel movement.  Thank you for choosing me and [facility].  Pricilla Riffle. Dagoberto Ligas., MD., Marval Regal

## 2015-08-21 ENCOUNTER — Encounter: Payer: Self-pay | Admitting: Internal Medicine

## 2015-08-21 ENCOUNTER — Ambulatory Visit (INDEPENDENT_AMBULATORY_CARE_PROVIDER_SITE_OTHER): Payer: PPO | Admitting: Internal Medicine

## 2015-08-21 VITALS — BP 310/56 | HR 78 | Wt 214.0 lb

## 2015-08-21 DIAGNOSIS — G459 Transient cerebral ischemic attack, unspecified: Secondary | ICD-10-CM | POA: Diagnosis not present

## 2015-08-21 DIAGNOSIS — E538 Deficiency of other specified B group vitamins: Secondary | ICD-10-CM

## 2015-08-21 DIAGNOSIS — L570 Actinic keratosis: Secondary | ICD-10-CM

## 2015-08-21 DIAGNOSIS — I739 Peripheral vascular disease, unspecified: Secondary | ICD-10-CM

## 2015-08-21 DIAGNOSIS — E785 Hyperlipidemia, unspecified: Secondary | ICD-10-CM | POA: Diagnosis not present

## 2015-08-21 NOTE — Assessment & Plan Note (Signed)
Re-start B12 

## 2015-08-21 NOTE — Assessment & Plan Note (Signed)
Chest - see procedure

## 2015-08-21 NOTE — Progress Notes (Signed)
Subjective:  Patient ID: Richard Burgess, male    DOB: 19-Feb-1929  Age: 82 y.o. MRN: [id number]  CC: No chief complaint on file.   HPI ARMONIE CRISSINGER presents for HTN, PVD, GERD, insomnia f/u  Outpatient Prescriptions Prior to Visit  Medication Sig Dispense Refill  . amLODipine (NORVASC) 5 MG tablet Take 1 tablet (5 mg total) by mouth daily. 90 tablet 1  . aspirin 81 MG tablet Take 81 mg by mouth daily.      . Cholecalciferol (SM VITAMIN D3) 1000 UNITS tablet Take 1,000 Units by mouth daily.      . colesevelam (WELCHOL) 625 MG tablet Take 2 tablets (1,250 mg total) by mouth 2 (two) times daily with a meal. 360 tablet 3  . losartan (COZAAR) 100 MG tablet Take 1 tablet (100 mg total) by mouth daily. 90 tablet 1  . Multiple Vitamins-Minerals (VISION-VITE PRESERVE PO) Take 1 tablet by mouth daily.     . pantoprazole (PROTONIX) 40 MG tablet Take 1 tablet (40 mg total) by mouth daily. 90 tablet 1  . Red Yeast Rice 600 MG CAPS Take 1 each by mouth 2 (two) times daily.    . sildenafil (VIAGRA) 100 MG tablet Take 1 tablet (100 mg total) by mouth as needed. 90 tablet 3  . terbinafine (LAMISIL) 250 MG tablet daily.  0  . traMADol (ULTRAM) 50 MG tablet TAKE 1 TABLET BY MOUTH EVERY 8 HOURS AS NEEDED 90 tablet 2  . traZODone (DESYREL) 50 MG tablet     . triamcinolone cream (KENALOG) 0.1 % APPLY TO AFFECTED AREA UP TO TWICE A DAY AS NEEDED NOT TO FACE GROIN UNDERARMS  3  . triamcinolone ointment (KENALOG) 0.5 % Apply 1 application topically 2 (two) times daily. 60 g 1  . zolpidem (AMBIEN) 5 MG tablet TAKE 1 TABLET BY MOUTH AT BEDTIME AS NEEDED FOR SLEEP 30 tablet 3   Facility-Administered Medications Prior to Visit  Medication Dose Route Frequency Provider Last Rate Last Dose  . methylPREDNISolone acetate (DEPO-MEDROL) injection 80 mg  80 mg Intra-articular Once Keneshia Tena V, MD        ROS Review of Systems  Constitutional: Negative for appetite change, fatigue and unexpected weight  change.  HENT: Negative for congestion, nosebleeds, sneezing, sore throat and trouble swallowing.   Eyes: Negative for itching and visual disturbance.  Respiratory: Negative for cough.   Cardiovascular: Negative for chest pain, palpitations and leg swelling.  Gastrointestinal: Negative for nausea, diarrhea, blood in stool and abdominal distention.  Genitourinary: Negative for frequency and hematuria.  Musculoskeletal: Positive for arthralgias. Negative for back pain, joint swelling, gait problem and neck pain.  Skin: Negative for rash.  Neurological: Negative for dizziness, tremors, speech difficulty and weakness.  Psychiatric/Behavioral: Negative for sleep disturbance, dysphoric mood and agitation. The patient is not nervous/anxious.     Objective:  BP 310/56 mmHg  Pulse 78  Wt 214 lb (97.07 kg)  SpO2 95%  BP Readings from Last 3 Encounters:  08/21/15 310/56  08/06/15 130/70  05/22/15 122/62    Wt Readings from Last 3 Encounters:  08/21/15 214 lb (97.07 kg)  08/06/15 211 lb 2 oz (95.766 kg)  05/22/15 212 lb (96.163 kg)    Physical Exam  Constitutional: He is oriented to person, place, and time. He appears well-developed. No distress.  NAD  HENT:  Mouth/Throat: Oropharynx is clear and moist.  Eyes: Conjunctivae are normal. Pupils are equal, round, and reactive to light.  Neck: Normal range  of motion. No JVD present. No thyromegaly present.  Cardiovascular: Normal rate, regular rhythm, normal heart sounds and intact distal pulses.  Exam reveals no gallop and no friction rub.   No murmur heard. Pulmonary/Chest: Effort normal and breath sounds normal. No respiratory distress. He has no wheezes. He has no rales. He exhibits no tenderness.  Abdominal: Soft. Bowel sounds are normal. He exhibits no distension and no mass. There is no tenderness. There is no rebound and no guarding.  Musculoskeletal: Normal range of motion. He exhibits no edema or tenderness.  Lymphadenopathy:     He has no cervical adenopathy.  Neurological: He is alert and oriented to person, place, and time. He has normal reflexes. No cranial nerve deficit. He exhibits normal muscle tone. He displays a negative Romberg sign. Coordination and gait normal.  Skin: Skin is warm and dry. No rash noted.  Psychiatric: He has a normal mood and affect. His behavior is normal. Judgment and thought content normal.  AK L chest   Procedure Note :     Procedure : Cryosurgery   Indication:  Wart(s)  Actinic keratosis(es)   Risks including unsuccessful procedure , bleeding, infection, bruising, scar, a need for a repeat  procedure and others were explained to the patient in detail as well as the benefits. Informed consent was obtained verbally.   1  lesion(s)  on L chest   was/were treated with liquid nitrogen on a Q-tip in a usual fasion . Band-Aid was applied and antibiotic ointment was given for a later use.   Tolerated well. Complications none.   Postprocedure instructions :     Keep the wounds clean. You can wash them with liquid soap and water. Pat dry with gauze or a Kleenex tissue  Before applying antibiotic ointment and a Band-Aid.   You need to report immediately  if  any signs of infection develop.     Lab Results  Component Value Date   WBC 7.7 10/30/2014   HGB 14.8 10/30/2014   HCT 43.3 10/30/2014   PLT 198.0 10/30/2014   GLUCOSE 128* 05/22/2015   CHOL 224* 08/01/2013   TRIG 183.0* 08/01/2013   HDL 40.60 08/01/2013   LDLDIRECT 164.5 08/01/2013   LDLCALC 91 04/22/2010   ALT 22 08/01/2013   AST 20 08/01/2013   NA 138 05/22/2015   K 4.2 05/22/2015   CL 104 05/22/2015   CREATININE 1.20 05/22/2015   BUN 24* 05/22/2015   CO2 26 05/22/2015   TSH 2.51 01/27/2014   PSA 0.28 03/23/2014   HGBA1C 6.5 08/01/2013   MICROALBUR 0.2 12/13/2007    Ct Head Wo Contrast  01/03/2015  CLINICAL DATA:  Occipital headache for 2 weeks, right-sided, some blurred vision EXAM: CT HEAD WITHOUT CONTRAST  TECHNIQUE: Contiguous axial images were obtained from the base of the skull through the vertex without intravenous contrast. COMPARISON:  CT brain scan of 01/03/2012 FINDINGS: The ventricular system is unchanged in size and configuration with only mild cortical atrophy noted. The septum remains midline in position. Minimal small vessel ischemic change is stable. No hemorrhage, mass lesion, or acute infarction is seen. Slightly prominent bifrontal CSF spaces are stable. On bone window images, the paranasal sinuses are pneumatized. No calvarial abnormality is seen. IMPRESSION: 1. No acute intracranial abnormality. 2. Atrophy and mild small vessel ischemic change. Electronically Signed   By: Ivar Drape M.D.   On: 01/03/2015 10:08    Assessment & Plan:   There are no diagnoses linked to this encounter.  I am having Mr. Alvidrez maintain his aspirin, Cholecalciferol, colesevelam, sildenafil, Red Yeast Rice, triamcinolone ointment, Multiple Vitamins-Minerals (VISION-VITE PRESERVE PO), zolpidem, traZODone, traMADol, terbinafine, triamcinolone cream, pantoprazole, amLODipine, and losartan. We will continue to administer methylPREDNISolone acetate.  No orders of the defined types were placed in this encounter.     Follow-up: No Follow-up on file.  [name], MD

## 2015-08-21 NOTE — Assessment & Plan Note (Signed)
Remote: On Amlodipine, Losartan, ASA, red rice yeast

## 2015-08-21 NOTE — Progress Notes (Signed)
Pre visit review using our clinic review tool, if applicable. No additional management support is needed unless otherwise documented below in the visit note. 

## 2015-08-21 NOTE — Assessment & Plan Note (Signed)
On Amlodipine, Losartan, ASA, red rice yeast

## 2015-08-21 NOTE — Assessment & Plan Note (Signed)
Statin intolerant 2/15 on red rice yeast

## 2015-08-29 ENCOUNTER — Encounter: Payer: Self-pay | Admitting: Family

## 2015-09-05 ENCOUNTER — Other Ambulatory Visit: Payer: Self-pay | Admitting: *Deleted

## 2015-09-05 ENCOUNTER — Encounter: Payer: Self-pay | Admitting: Family

## 2015-09-05 ENCOUNTER — Ambulatory Visit (INDEPENDENT_AMBULATORY_CARE_PROVIDER_SITE_OTHER): Payer: PPO | Admitting: Family

## 2015-09-05 ENCOUNTER — Ambulatory Visit (HOSPITAL_COMMUNITY)
Admission: RE | Admit: 2015-09-05 | Discharge: 2015-09-05 | Disposition: A | Discharge status: Home / Self Care | Payer: PPO | Source: Ambulatory Visit | Attending: Surgery | Admitting: Surgery

## 2015-09-05 VITALS — BP 145/76 | HR 71 | Temp 97.3°F | Resp 18 | Ht 69.0 in | Wt 211.0 lb

## 2015-09-05 DIAGNOSIS — I6523 Occlusion and stenosis of bilateral carotid arteries: Secondary | ICD-10-CM

## 2015-09-05 DIAGNOSIS — I10 Essential (primary) hypertension: Secondary | ICD-10-CM | POA: Insufficient documentation

## 2015-09-05 DIAGNOSIS — I739 Peripheral vascular disease, unspecified: Secondary | ICD-10-CM | POA: Diagnosis not present

## 2015-09-05 DIAGNOSIS — Z87891 Personal history of nicotine dependence: Secondary | ICD-10-CM | POA: Diagnosis not present

## 2015-09-05 DIAGNOSIS — K219 Gastro-esophageal reflux disease without esophagitis: Secondary | ICD-10-CM | POA: Diagnosis not present

## 2015-09-05 DIAGNOSIS — E785 Hyperlipidemia, unspecified: Secondary | ICD-10-CM | POA: Insufficient documentation

## 2015-09-05 NOTE — Progress Notes (Signed)
Filed Vitals:   09/05/15 1212 09/05/15 1217 09/05/15 1218  BP: 140/74 151/73 145/76  Pulse: 66 66 71  Temp: 97.3 F (36.3 C)    Resp: 18    Height: 5\' 9"  (1.753 m)    Weight: 211 lb (95.709 kg)    SpO2: 97%

## 2015-09-05 NOTE — Patient Instructions (Signed)
Stroke Prevention Some medical conditions and behaviors are associated with an increased chance of having a stroke. You may prevent a stroke by making healthy choices and managing medical conditions. HOW CAN I REDUCE MY RISK OF HAVING A STROKE?   Stay physically active. Get at least 30 minutes of activity on most or all days.  Do not smoke. It may also be helpful to avoid exposure to secondhand smoke.  Limit alcohol use. Moderate alcohol use is considered to be:  No more than 2 drinks per day for men.  No more than 1 drink per day for nonpregnant women.  Eat healthy foods. This involves:  Eating 5 or more servings of fruits and vegetables a day.  Making dietary changes that address high blood pressure (hypertension), high cholesterol, diabetes, or obesity.  Manage your cholesterol levels.  Making food choices that are high in fiber and low in saturated fat, trans fat, and cholesterol may control cholesterol levels.  Take any prescribed medicines to control cholesterol as directed by your health care provider.  Manage your diabetes.  Controlling your carbohydrate and sugar intake is recommended to manage diabetes.  Take any prescribed medicines to control diabetes as directed by your health care provider.  Control your hypertension.  Making food choices that are low in salt (sodium), saturated fat, trans fat, and cholesterol is recommended to manage hypertension.  Ask your health care provider if you need treatment to lower your blood pressure. Take any prescribed medicines to control hypertension as directed by your health care provider.  If you are 18-39 years of age, have your blood pressure checked every 3-5 years. If you are 40 years of age or older, have your blood pressure checked every year.  Maintain a healthy weight.  Reducing calorie intake and making food choices that are low in sodium, saturated fat, trans fat, and cholesterol are recommended to manage  weight.  Stop drug abuse.  Avoid taking birth control pills.  Talk to your health care provider about the risks of taking birth control pills if you are over 35 years old, smoke, get migraines, or have ever had a blood clot.  Get evaluated for sleep disorders (sleep apnea).  Talk to your health care provider about getting a sleep evaluation if you snore a lot or have excessive sleepiness.  Take medicines only as directed by your health care provider.  For some people, aspirin or blood thinners (anticoagulants) are helpful in reducing the risk of forming abnormal blood clots that can lead to stroke. If you have the irregular heart rhythm of atrial fibrillation, you should be on a blood thinner unless there is a good reason you cannot take them.  Understand all your medicine instructions.  Make sure that other conditions (such as anemia or atherosclerosis) are addressed. SEEK IMMEDIATE MEDICAL CARE IF:   You have sudden weakness or numbness of the face, arm, or leg, especially on one side of the body.  Your face or eyelid droops to one side.  You have sudden confusion.  You have trouble speaking (aphasia) or understanding.  You have sudden trouble seeing in one or both eyes.  You have sudden trouble walking.  You have dizziness.  You have a loss of balance or coordination.  You have a sudden, severe headache with no known cause.  You have new chest pain or an irregular heartbeat. Any of these symptoms may represent a serious problem that is an emergency. Do not wait to see if the symptoms will   go away. Get medical help at once. Call your local emergency services (911 in [place] Do not drive yourself to the hospital.   This information is not intended to replace advice given to you by your health care provider. Make sure you discuss any questions you have with your health care provider.   Document Released: 07/03/2004 Document Revised: 06/16/2014 Document Reviewed:  11/26/2012 Elsevier Interactive Patient Education 2016 [employer]  

## 2015-09-05 NOTE — Progress Notes (Signed)
Chief Complaint: Extracranial Carotid Artery Stenosis   History of Present Illness  Richard Burgess is a 82 y.o. male patient of Dr. Trula Slade patient is back today for follow-up of his extracranial carotid artery occlusive disease.   The patient denies any history of TIA or stroke symptoms, specifically the patient denies a history of amaurosis fugax or monocular blindness, denies a history unilateral  of facial drooping, denies a history of hemiplegia, and denies a history of receptive or expressive aphasia.    He continues to be a nonsmoker. His hypertension is treated with an ARB. He is on single agent antiplatelet therapy with a baby aspirin.    Pt denies claudication symptoms with walking.   The patient denies New Medical or Surgical History  Pt Diabetic: no Pt smoker: former smoker, quit in the 1960's, smoked for about 20 years, wife smokes outside the house  Pt meds include: Statin : no, he had arthralgias to a statin, He takes Welchol ASA: yes Other anticoagulants/antiplatelets: no   Past Medical History  Diagnosis Date  . Diverticulosis of colon   . Hypertention, malignant, with acute intensive management   . PVD (peripheral vascular disease) ([place])     Bilateral carotid  . Hx-TIA (transient ischemic attack)   . ED (erectile dysfunction)   . Glucose intolerance (impaired glucose tolerance)   . Hyperlipidemia   . GERD (gastroesophageal reflux disease)   . Allergy   . Osteoarthritis   . Low back pain   . History of pancreatitis   . BPH (benign prostatic hypertrophy)   . Tubular adenoma of colon 08/1991    One with carcinoma IN [facility] 1993, multiple adenomatous  . Hypertension   . Carotid artery occlusion     Social History Social History  Substance Use Topics  . Smoking status: Former Smoker -- 20 years    Types: Cigarettes  . Smokeless tobacco: Never Used     Comment: Daily caffeine use, regular exercise  . Alcohol Use: 4.2 - 8.4 oz/week    7-14 Standard  drinks or equivalent per week     Comment: occ beer    Family History Family History  Problem Relation Age of Onset  . Dementia Father   . Colon cancer Sister     dx in her 38's  . Diabetes Mother   . Peripheral vascular disease Brother     Surgical History Past Surgical History  Procedure Laterality Date  . Appendectomy    . Cholecystectomy    . Basal cell carcinoma excision    . Lumbar spine surgery      X4  . Colonoscopy    . Esophagogastroduodenoscopy      Allergies  Allergen Reactions  . Oxycodone     crazy  . Pravastatin Sodium     REACTION: bad dreams  . Rosuvastatin     REACTION: palpitations  . Statins     REACTION: aches    Current Outpatient Prescriptions  Medication Sig Dispense Refill  . amLODipine (NORVASC) 5 MG tablet Take 1 tablet (5 mg total) by mouth daily. 90 tablet 1  . aspirin 81 MG tablet Take 81 mg by mouth daily.      . Cholecalciferol (SM VITAMIN D3) 1000 UNITS tablet Take 1,000 Units by mouth daily.      . colesevelam (WELCHOL) 625 MG tablet Take 2 tablets (1,250 mg total) by mouth 2 (two) times daily with a meal. 360 tablet 3  . losartan (COZAAR) 100 MG tablet Take  1 tablet (100 mg total) by mouth daily. 90 tablet 1  . Multiple Vitamins-Minerals (VISION-VITE PRESERVE PO) Take 1 tablet by mouth daily.     . pantoprazole (PROTONIX) 40 MG tablet Take 1 tablet (40 mg total) by mouth daily. 90 tablet 1  . Red Yeast Rice 600 MG CAPS Take 1 each by mouth 2 (two) times daily.    . sildenafil (VIAGRA) 100 MG tablet Take 1 tablet (100 mg total) by mouth as needed. 90 tablet 3  . terbinafine (LAMISIL) 250 MG tablet daily.  0  . traMADol (ULTRAM) 50 MG tablet TAKE 1 TABLET BY MOUTH EVERY 8 HOURS AS NEEDED 90 tablet 2  . traZODone (DESYREL) 50 MG tablet     . triamcinolone cream (KENALOG) 0.1 % APPLY TO AFFECTED AREA UP TO TWICE A DAY AS NEEDED NOT TO FACE GROIN UNDERARMS  3  . triamcinolone ointment (KENALOG) 0.5 % Apply 1 application topically 2  (two) times daily. 60 g 1  . zolpidem (AMBIEN) 5 MG tablet TAKE 1 TABLET BY MOUTH AT BEDTIME AS NEEDED FOR SLEEP 30 tablet 3   Current Facility-Administered Medications  Medication Dose Route Frequency Provider Last Rate Last Dose  . methylPREDNISolone acetate (DEPO-MEDROL) injection 80 mg  80 mg Intra-articular Once Lew Dawes V, MD        Review of Systems : See HPI for pertinent positives and negatives.  Physical Examination  Filed Vitals:   09/05/15 1212 09/05/15 1217 09/05/15 1218  BP: 140/74 151/73 145/76  Pulse: 66 66 71  Temp: 97.3 F (36.3 C)    Resp: 18    Height: 5\' 9"  (1.753 m)    Weight: 211 lb (95.709 kg)    SpO2: 97%     Body mass index is 31.15 kg/(m^2).   General: WDWN obese male in NAD GAIT: normal Eyes: PERRLA Pulmonary:  Non-labored, CTAB, no rales, rhonchi, or wheezing.  Cardiac: regular rhythm, no detected murmur.  VASCULAR EXAM Carotid Bruits Right Left   Negative Negative    Aorta is not palpable. Radial pulses are 2+ palpable and equal.                                                                                                                            LE Pulses Right Left       POPLITEAL  not palpable   not palpable       POSTERIOR TIBIAL   palpable    palpable        DORSALIS PEDIS      ANTERIOR TIBIAL  palpable   palpable     Gastrointestinal: soft, nontender, BS WNL, no r/g, no palpable masses.  Musculoskeletal: no muscle atrophy/wasting. M/S 5/5 throughout, extremities without ischemic changes.  Neurologic: A&O X 3; Appropriate Affect, Speech is normal CN 2-12 intact, pain and light touch intact in extremities, motor exam as listed above.   Non-Invasive Vascular Imaging CAROTID DUPLEX 09/05/2015   Right ICA: 60 -  79 % stenosis. Left ICA: <40% stenosis. Right vertebral artery could not be identified. Left vertebral artery is WNL. Increase in right ICA stenosis compared to last exam   Assessment: Richard Burgess is a 82 y.o. male who has no history of stroke or TIA.  Today's carotid duplex suggests 60 - 79 % right ICA stenosis, and <40% left ICA stenosis. Right vertebral artery could not be identified. Left vertebral artery is WNL. Increase in right ICA stenosis compared to last exam   Fortunately he does not have DM. His atherosclerotic risk factors include remote history of smoking and obesity.  He is statin intolerant, he takes a daily 81 mg ASA.    Plan: Follow-up in 6 months with Carotid Duplex scan.   I discussed in depth with the patient the nature of atherosclerosis, and emphasized the importance of maximal medical management including strict control of blood pressure, blood glucose, and lipid levels, obtaining regular exercise, and continued cessation of smoking.  The patient is aware that without maximal medical management the underlying atherosclerotic disease process will progress, limiting the benefit of any interventions. The patient was given information about stroke prevention and what symptoms should prompt the patient to seek immediate medical care. Thank you for allowing [place] to participate in this patient's care.  Clemon Chambers, RN, MSN, FNP-C [facility] Office: [phone]  Clinic Physician: Oneida Alar on call  09/05/2015 11:13 AM

## 2015-10-16 ENCOUNTER — Other Ambulatory Visit: Payer: Self-pay | Admitting: Internal Medicine

## 2015-10-17 NOTE — Telephone Encounter (Signed)
Done

## 2015-11-21 ENCOUNTER — Encounter: Payer: Self-pay | Admitting: Internal Medicine

## 2015-11-21 ENCOUNTER — Ambulatory Visit (INDEPENDENT_AMBULATORY_CARE_PROVIDER_SITE_OTHER): Payer: PPO | Admitting: Internal Medicine

## 2015-11-21 VITALS — BP 138/64 | HR 72 | Wt 208.0 lb

## 2015-11-21 DIAGNOSIS — R202 Paresthesia of skin: Secondary | ICD-10-CM | POA: Diagnosis not present

## 2015-11-21 DIAGNOSIS — M5440 Lumbago with sciatica, unspecified side: Secondary | ICD-10-CM

## 2015-11-21 DIAGNOSIS — L570 Actinic keratosis: Secondary | ICD-10-CM

## 2015-11-21 DIAGNOSIS — N529 Male erectile dysfunction, unspecified: Secondary | ICD-10-CM

## 2015-11-21 DIAGNOSIS — I10 Essential (primary) hypertension: Secondary | ICD-10-CM | POA: Diagnosis not present

## 2015-11-21 DIAGNOSIS — E538 Deficiency of other specified B group vitamins: Secondary | ICD-10-CM

## 2015-11-21 DIAGNOSIS — G8929 Other chronic pain: Secondary | ICD-10-CM

## 2015-11-21 NOTE — Progress Notes (Signed)
Subjective:  Patient ID: Richard Burgess, male    DOB: [birth year]  Age: 82 y.o. MRN: [id number]  CC: No chief complaint on file.   HPI [name] presents for HTN, GERD, ED f/u. C/o skin lesions Pt hit his L thumb w/a hammer - declined X ray   Outpatient Prescriptions Prior to Visit  Medication Sig Dispense Refill  . amLODipine (NORVASC) 5 MG tablet Take 1 tablet (5 mg total) by mouth daily. 90 tablet 1  . aspirin 81 MG tablet Take 81 mg by mouth daily.      . Cholecalciferol (SM VITAMIN D3) 1000 UNITS tablet Take 1,000 Units by mouth daily.      . colesevelam (WELCHOL) 625 MG tablet Take 2 tablets (1,250 mg total) by mouth 2 (two) times daily with a meal. 360 tablet 3  . losartan (COZAAR) 100 MG tablet Take 1 tablet (100 mg total) by mouth daily. 90 tablet 1  . Multiple Vitamins-Minerals (VISION-VITE PRESERVE PO) Take 1 tablet by mouth daily.     . pantoprazole (PROTONIX) 40 MG tablet Take 1 tablet (40 mg total) by mouth daily. 90 tablet 1  . Red Yeast Rice 600 MG CAPS Take 1 each by mouth 2 (two) times daily.    . sildenafil (VIAGRA) 100 MG tablet Take 1 tablet (100 mg total) by mouth as needed. 90 tablet 3  . terbinafine (LAMISIL) 250 MG tablet daily.  0  . traMADol (ULTRAM) 50 MG tablet TAKE 1 TABLET BY MOUTH EVERY 8 HOURS AS NEEDED 90 tablet 2  . traZODone (DESYREL) 50 MG tablet Reported on 09/05/2015    . triamcinolone cream (KENALOG) 0.1 % APPLY TO AFFECTED AREA UP TO TWICE A DAY AS NEEDED NOT TO FACE GROIN UNDERARMS  3  . triamcinolone ointment (KENALOG) 0.5 % Apply 1 application topically 2 (two) times daily. 60 g 1  . zolpidem (AMBIEN) 5 MG tablet TAKE 1 TABLET BY MOUTH AT BEDTIME AS NEEDED FOR SLEEP 30 tablet 3   Facility-Administered Medications Prior to Visit  Medication Dose Route Frequency Provider Last Rate Last Dose  . methylPREDNISolone acetate (DEPO-MEDROL) injection 80 mg  80 mg Intra-articular Once Cassandria Anger, MD        ROS Review of Systems    Constitutional: Negative for appetite change, fatigue and unexpected weight change.  HENT: Negative for congestion, nosebleeds, sneezing, sore throat and trouble swallowing.   Eyes: Negative for itching and visual disturbance.  Respiratory: Negative for cough.   Cardiovascular: Negative for chest pain, palpitations and leg swelling.  Gastrointestinal: Negative for nausea, diarrhea, blood in stool and abdominal distention.  Genitourinary: Negative for frequency and hematuria.  Musculoskeletal: Positive for back pain and arthralgias. Negative for joint swelling, gait problem and neck pain.  Skin: Negative for rash.  Neurological: Negative for dizziness, tremors, speech difficulty and weakness.  Psychiatric/Behavioral: Negative for sleep disturbance, dysphoric mood and agitation. The patient is not nervous/anxious.     Objective:  BP 138/64 mmHg  Pulse 72  Wt 208 lb (94.348 kg)  SpO2 97%  BP Readings from Last 3 Encounters:  11/21/15 138/64  09/05/15 145/76  08/21/15 310/56    Wt Readings from Last 3 Encounters:  11/21/15 208 lb (94.348 kg)  09/05/15 211 lb (95.709 kg)  08/21/15 214 lb (97.07 kg)    Physical Exam  Constitutional: He is oriented to person, place, and time. He appears well-developed. No distress.  NAD  HENT:  Mouth/Throat: Oropharynx is clear and moist.  Eyes: Conjunctivae are normal. Pupils are equal, round, and reactive to light.  Neck: Normal range of motion. No JVD present. No thyromegaly present.  Cardiovascular: Normal rate, regular rhythm, normal heart sounds and intact distal pulses.  Exam reveals no gallop and no friction rub.   No murmur heard. Pulmonary/Chest: Effort normal and breath sounds normal. No respiratory distress. He has no wheezes. He has no rales. He exhibits no tenderness.  Abdominal: Soft. Bowel sounds are normal. He exhibits no distension and no mass. There is no tenderness. There is no rebound and no guarding.  Musculoskeletal: Normal  range of motion. He exhibits edema. He exhibits no tenderness.  Lymphadenopathy:    He has no cervical adenopathy.  Neurological: He is alert and oriented to person, place, and time. He has normal reflexes. No cranial nerve deficit. He exhibits normal muscle tone. He displays a negative Romberg sign. Coordination and gait normal.  Skin: Skin is warm and dry. No rash noted.  Psychiatric: He has a normal mood and affect. His behavior is normal. Judgment and thought content normal.  L thumb is swollen and bruised AKs on face 1 AK on back   Procedure Note :     Procedure : Cryosurgery   Indication:    Actinic keratosis(es)   Risks including unsuccessful procedure , bleeding, infection, bruising, scar, a need for a repeat  procedure and others were explained to the patient in detail as well as the benefits. Informed consent was obtained verbally.    6 lesion(s)  on face, back was/were treated with liquid nitrogen on a Q-tip in a usual fasion . Band-Aid was applied and antibiotic ointment was given for a later use.   Tolerated well. Complications none.      Lab Results  Component Value Date   WBC 7.7 10/30/2014   HGB 14.8 10/30/2014   HCT 43.3 10/30/2014   PLT 198.0 10/30/2014   GLUCOSE 128* 05/22/2015   CHOL 224* 08/01/2013   TRIG 183.0* 08/01/2013   HDL 40.60 08/01/2013   LDLDIRECT 164.5 08/01/2013   LDLCALC 91 04/22/2010   ALT 22 08/01/2013   AST 20 08/01/2013   NA 138 05/22/2015   K 4.2 05/22/2015   CL 104 05/22/2015   CREATININE 1.20 05/22/2015   BUN 24* 05/22/2015   CO2 26 05/22/2015   TSH 2.51 01/27/2014   PSA 0.28 03/23/2014   HGBA1C 6.5 08/01/2013   MICROALBUR 0.2 12/13/2007    No results found.  Assessment & Plan:   There are no diagnoses linked to this encounter. I am having Mr. Mainer maintain his aspirin, Cholecalciferol, colesevelam, sildenafil, Red Yeast Rice, triamcinolone ointment, Multiple Vitamins-Minerals (VISION-VITE PRESERVE PO), zolpidem,  traZODone, terbinafine, triamcinolone cream, pantoprazole, amLODipine, losartan, and traMADol. We will continue to administer methylPREDNISolone acetate.  No orders of the defined types were placed in this encounter.     Follow-up: No Follow-up on file.  [name], MD

## 2015-11-21 NOTE — Assessment & Plan Note (Signed)
On Amlodipine, Losartan °

## 2015-11-21 NOTE — Assessment & Plan Note (Signed)
Viagra prn 

## 2015-11-21 NOTE — Patient Instructions (Addendum)
   Postprocedure instructions :     Keep the wounds clean. You can wash them with liquid soap and water. Pat dry with gauze or a Kleenex tissue  Before applying antibiotic ointment and a Band-Aid.   You need to report immediately  if  any signs of infection develop.    

## 2015-11-21 NOTE — Assessment & Plan Note (Signed)
Chronic OA 

## 2015-11-21 NOTE — Assessment & Plan Note (Signed)
2017 R CTS surgery at the [facility] - better

## 2015-11-21 NOTE — Progress Notes (Signed)
Pre visit review using our clinic review tool, if applicable. No additional management support is needed unless otherwise documented below in the visit note. 

## 2015-11-22 NOTE — Assessment & Plan Note (Signed)
See procedure 

## 2015-11-22 NOTE — Assessment & Plan Note (Signed)
On B12 

## 2016-01-08 DIAGNOSIS — R490 Dysphonia: Secondary | ICD-10-CM | POA: Diagnosis not present

## 2016-01-08 DIAGNOSIS — J310 Chronic rhinitis: Secondary | ICD-10-CM | POA: Diagnosis not present

## 2016-02-18 ENCOUNTER — Encounter: Payer: Self-pay | Admitting: Internal Medicine

## 2016-02-18 ENCOUNTER — Ambulatory Visit (INDEPENDENT_AMBULATORY_CARE_PROVIDER_SITE_OTHER): Payer: PPO | Admitting: Internal Medicine

## 2016-02-18 ENCOUNTER — Telehealth: Payer: Self-pay | Admitting: Internal Medicine

## 2016-02-18 ENCOUNTER — Other Ambulatory Visit (INDEPENDENT_AMBULATORY_CARE_PROVIDER_SITE_OTHER): Payer: PPO

## 2016-02-18 VITALS — BP 148/70 | HR 74 | Temp 97.8°F | Wt 208.0 lb

## 2016-02-18 DIAGNOSIS — G459 Transient cerebral ischemic attack, unspecified: Secondary | ICD-10-CM

## 2016-02-18 DIAGNOSIS — M5440 Lumbago with sciatica, unspecified side: Secondary | ICD-10-CM | POA: Diagnosis not present

## 2016-02-18 DIAGNOSIS — Z23 Encounter for immunization: Secondary | ICD-10-CM

## 2016-02-18 DIAGNOSIS — I6521 Occlusion and stenosis of right carotid artery: Secondary | ICD-10-CM

## 2016-02-18 DIAGNOSIS — I10 Essential (primary) hypertension: Secondary | ICD-10-CM

## 2016-02-18 DIAGNOSIS — E785 Hyperlipidemia, unspecified: Secondary | ICD-10-CM

## 2016-02-18 DIAGNOSIS — E538 Deficiency of other specified B group vitamins: Secondary | ICD-10-CM

## 2016-02-18 DIAGNOSIS — I739 Peripheral vascular disease, unspecified: Secondary | ICD-10-CM

## 2016-02-18 DIAGNOSIS — G8929 Other chronic pain: Secondary | ICD-10-CM

## 2016-02-18 LAB — BASIC METABOLIC PANEL
BUN: 19 mg/dL (ref 6–23)
CALCIUM: 9.9 mg/dL (ref 8.4–10.5)
CO2: 28 mEq/L (ref 19–32)
CREATININE: 0.94 mg/dL (ref 0.40–1.50)
Chloride: 103 mEq/L (ref 96–112)
GFR: 81.03 mL/min (ref >60.00)
Glucose, Bld: 118 mg/dL — ABNORMAL HIGH (ref 70–99)
Potassium: 4.9 mEq/L (ref 3.5–5.1)
Sodium: 136 mEq/L (ref 135–145)

## 2016-02-18 LAB — LDL CHOLESTEROL, DIRECT: Direct LDL: 153 mg/dL

## 2016-02-18 LAB — URINALYSIS
Bilirubin Urine: NEGATIVE
Hgb urine dipstick: NEGATIVE
KETONES UR: NEGATIVE
LEUKOCYTES UA: NEGATIVE
Nitrite: NEGATIVE
PH: 7 (ref 5.0–8.0)
SPECIFIC GRAVITY, URINE: 1.01 (ref 1.000–1.030)
Total Protein, Urine: NEGATIVE
URINE GLUCOSE: NEGATIVE
Urobilinogen, UA: 0.2 (ref 0.0–1.0)

## 2016-02-18 LAB — HEPATIC FUNCTION PANEL
ALK PHOS: 67 U/L (ref 39–117)
ALT: 15 U/L (ref 0–53)
AST: 15 U/L (ref 0–37)
Albumin: 4.4 g/dL (ref 3.5–5.2)
BILIRUBIN DIRECT: 0.1 mg/dL (ref 0.0–0.3)
TOTAL PROTEIN: 7.3 g/dL (ref 6.0–8.3)
Total Bilirubin: 1 mg/dL (ref 0.2–1.2)

## 2016-02-18 LAB — LIPID PANEL
CHOLESTEROL: 220 mg/dL — AB (ref 0–200)
HDL: 37.5 mg/dL — ABNORMAL LOW (ref >39.00)
NonHDL: 182.41
TRIGLYCERIDES: 233 mg/dL — AB (ref 0.0–149.0)
Total CHOL/HDL Ratio: 6
VLDL: 46.6 mg/dL — ABNORMAL HIGH (ref 0.0–40.0)

## 2016-02-18 LAB — TSH: TSH: 2.88 u[IU]/mL (ref 0.35–4.50)

## 2016-02-18 NOTE — Assessment & Plan Note (Signed)
Off Amlodipine 9/17 Cont w/ Losartan

## 2016-02-18 NOTE — Progress Notes (Signed)
Subjective:  Patient ID: Richard Burgess, male    DOB: [birth year]  Age: 82 y.o. MRN: [id number]  CC: No chief complaint on file.   HPI Richard Burgess presents for HTN, PVD, dyslipidemia. The pt stopped "my BP pill" because "it was going to paralize my legs" -" I'm well now": he thought he was on one BP pill.  Outpatient Medications Prior to Visit  Medication Sig Dispense Refill  . amLODipine (NORVASC) 5 MG tablet Take 1 tablet (5 mg total) by mouth daily. 90 tablet 1  . aspirin 81 MG tablet Take 81 mg by mouth daily.      . Cholecalciferol (SM VITAMIN D3) 1000 UNITS tablet Take 1,000 Units by mouth daily.      . colesevelam (WELCHOL) 625 MG tablet Take 2 tablets (1,250 mg total) by mouth 2 (two) times daily with a meal. 360 tablet 3  . losartan (COZAAR) 100 MG tablet Take 1 tablet (100 mg total) by mouth daily. 90 tablet 1  . Multiple Vitamins-Minerals (VISION-VITE PRESERVE PO) Take 1 tablet by mouth daily.     . pantoprazole (PROTONIX) 40 MG tablet Take 1 tablet (40 mg total) by mouth daily. 90 tablet 1  . Red Yeast Rice 600 MG CAPS Take 1 each by mouth 2 (two) times daily.    . sildenafil (VIAGRA) 100 MG tablet Take 1 tablet (100 mg total) by mouth as needed. 90 tablet 3  . terbinafine (LAMISIL) 250 MG tablet daily.  0  . traMADol (ULTRAM) 50 MG tablet TAKE 1 TABLET BY MOUTH EVERY 8 HOURS AS NEEDED 90 tablet 2  . traZODone (DESYREL) 50 MG tablet Reported on 09/05/2015    . triamcinolone cream (KENALOG) 0.1 % APPLY TO AFFECTED AREA UP TO TWICE A DAY AS NEEDED NOT TO FACE GROIN UNDERARMS  3  . triamcinolone ointment (KENALOG) 0.5 % Apply 1 application topically 2 (two) times daily. 60 g 1  . zolpidem (AMBIEN) 5 MG tablet TAKE 1 TABLET BY MOUTH AT BEDTIME AS NEEDED FOR SLEEP 30 tablet 3   Facility-Administered Medications Prior to Visit  Medication Dose Route Frequency Provider Last Rate Last Dose  . methylPREDNISolone acetate (DEPO-MEDROL) injection 80 mg  80 mg Intra-articular Once  Cassandria Anger, MD        ROS Review of Systems  Constitutional: Negative for appetite change, fatigue and unexpected weight change.  HENT: Negative for congestion, nosebleeds, sneezing, sore throat and trouble swallowing.   Eyes: Negative for itching and visual disturbance.  Respiratory: Negative for cough.   Cardiovascular: Negative for chest pain, palpitations and leg swelling.  Gastrointestinal: Negative for abdominal distention, blood in stool, diarrhea and nausea.  Genitourinary: Negative for frequency and hematuria.  Musculoskeletal: Negative for back pain, gait problem, joint swelling and neck pain.  Skin: Negative for rash.  Neurological: Negative for dizziness, tremors, speech difficulty and weakness.  Psychiatric/Behavioral: Negative for agitation, dysphoric mood and sleep disturbance. The patient is not nervous/anxious.     Objective:  BP (!) 148/70   Pulse 74   Temp 97.8 F (36.6 C) (Oral)   Wt 208 lb (94.3 kg)   SpO2 92%   BMI 30.72 kg/m   BP Readings from Last 3 Encounters:  02/18/16 (!) 148/70  11/21/15 138/64  09/05/15 (!) 145/76    Wt Readings from Last 3 Encounters:  02/18/16 208 lb (94.3 kg)  11/21/15 208 lb (94.3 kg)  09/05/15 211 lb (95.7 kg)    Physical Exam  Constitutional: He  is oriented to person, place, and time. He appears well-developed. No distress.  NAD  HENT:  Mouth/Throat: Oropharynx is clear and moist.  Eyes: Conjunctivae are normal. Pupils are equal, round, and reactive to light.  Neck: Normal range of motion. No JVD present. No thyromegaly present.  Cardiovascular: Normal rate, regular rhythm, normal heart sounds and intact distal pulses.  Exam reveals no gallop and no friction rub.   No murmur heard. Pulmonary/Chest: Effort normal and breath sounds normal. No respiratory distress. He has no wheezes. He has no rales. He exhibits no tenderness.  Abdominal: Soft. Bowel sounds are normal. He exhibits no distension and no mass.  There is no tenderness. There is no rebound and no guarding.  Musculoskeletal: Normal range of motion. He exhibits no edema or tenderness.  Lymphadenopathy:    He has no cervical adenopathy.  Neurological: He is alert and oriented to person, place, and time. He has normal reflexes. No cranial nerve deficit. He exhibits normal muscle tone. He displays a negative Romberg sign. Coordination and gait normal.  Skin: Skin is warm and dry. No rash noted.  Psychiatric: He has a normal mood and affect. His behavior is normal. Judgment and thought content normal.  L temple scar  Lab Results  Component Value Date   WBC 7.7 10/30/2014   HGB 14.8 10/30/2014   HCT 43.3 10/30/2014   PLT 198.0 10/30/2014   GLUCOSE 128 (H) 05/22/2015   CHOL 224 (H) 08/01/2013   TRIG 183.0 (H) 08/01/2013   HDL 40.60 08/01/2013   LDLDIRECT 164.5 08/01/2013   LDLCALC 91 04/22/2010   ALT 22 08/01/2013   AST 20 08/01/2013   NA 138 05/22/2015   K 4.2 05/22/2015   CL 104 05/22/2015   CREATININE 1.20 05/22/2015   BUN 24 (H) 05/22/2015   CO2 26 05/22/2015   TSH 2.51 01/27/2014   PSA 0.28 03/23/2014   HGBA1C 6.5 08/01/2013   MICROALBUR 0.2 12/13/2007    No results found.  Assessment & Plan:   There are no diagnoses linked to this encounter. I am having Mr. Karen maintain his aspirin, Cholecalciferol, colesevelam, sildenafil, Red Yeast Rice, triamcinolone ointment, Multiple Vitamins-Minerals (VISION-VITE PRESERVE PO), zolpidem, traZODone, terbinafine, triamcinolone cream, pantoprazole, amLODipine, losartan, and traMADol. We will continue to administer methylPREDNISolone acetate.  No orders of the defined types were placed in this encounter.    Follow-up: No Follow-up on file.  [name], MD

## 2016-02-18 NOTE — Assessment & Plan Note (Signed)
Off Amlodipine Cont w/ Losartan, ASA, red rice yeast

## 2016-02-18 NOTE — Assessment & Plan Note (Signed)
Doing fair 

## 2016-02-18 NOTE — Telephone Encounter (Signed)
Patient wife called stating patient is taking amlodipine and omeprazole  for BP.  States Dr. Camila Li wanted him to call back to let him know what he was taking for BP.  States believes patient stopped taking losartan.

## 2016-02-18 NOTE — Assessment & Plan Note (Addendum)
Off Amlodipine Cont w/ Losartan, ASA, red rice yeast

## 2016-02-18 NOTE — Telephone Encounter (Signed)
Noted - re-start Losartan. Omeprazole is for GERD Thx

## 2016-02-18 NOTE — Assessment & Plan Note (Signed)
On B12 

## 2016-02-18 NOTE — Addendum Note (Signed)
Addended by: Cresenciano Lick on: 02/18/2016 02:33 PM   Modules accepted: Orders

## 2016-02-18 NOTE — Progress Notes (Signed)
Pre visit review using our clinic review tool, if applicable. No additional management support is needed unless otherwise documented below in the visit note. 

## 2016-02-18 NOTE — Assessment & Plan Note (Signed)
Doppler US tomorrow

## 2016-02-19 MED ORDER — LOSARTAN POTASSIUM 100 MG PO TABS: 100.0000 mg | ORAL_TABLET | Freq: Every day | ORAL | 1 refills | Status: DC

## 2016-02-19 NOTE — Telephone Encounter (Signed)
Notified pt wife w/MD response. Sent rx to [facility] on [address] Los[place] since she stated they could not locate med...Johny Chess

## 2016-02-20 NOTE — Telephone Encounter (Signed)
Pt called back- he states he can't take Losartan due to leg numbness and stiffness. He is upset that you advised him to restart it. He states he is not going to take Losartan. Please advise.

## 2016-02-20 NOTE — Telephone Encounter (Signed)
He said that his leg sx's were caused by Amlodipine, not Losartan.  Thx

## 2016-02-21 NOTE — Telephone Encounter (Signed)
I called pt- he seems very confused about what he has continued taking. He believes losartan is the medication that caused his leg sxs. He states he is going to bring all of the bottles of the medications he has continued so we will know more.

## 2016-03-05 ENCOUNTER — Encounter: Payer: Self-pay | Admitting: Family

## 2016-03-10 ENCOUNTER — Ambulatory Visit (INDEPENDENT_AMBULATORY_CARE_PROVIDER_SITE_OTHER): Payer: PPO | Admitting: Family

## 2016-03-10 ENCOUNTER — Ambulatory Visit (HOSPITAL_COMMUNITY)
Admission: RE | Admit: 2016-03-10 | Discharge: 2016-03-10 | Disposition: A | Discharge status: Home / Self Care | Payer: PPO | Source: Ambulatory Visit | Attending: Family | Admitting: Family

## 2016-03-10 ENCOUNTER — Encounter: Payer: Self-pay | Admitting: Family

## 2016-03-10 VITALS — BP 140/70 | HR 63 | Temp 98.3°F | Resp 16 | Ht 69.0 in

## 2016-03-10 DIAGNOSIS — Z87891 Personal history of nicotine dependence: Secondary | ICD-10-CM

## 2016-03-10 DIAGNOSIS — I6523 Occlusion and stenosis of bilateral carotid arteries: Secondary | ICD-10-CM

## 2016-03-10 NOTE — Patient Instructions (Signed)
Stroke Prevention Some medical conditions and behaviors are associated with an increased chance of having a stroke. You may prevent a stroke by making healthy choices and managing medical conditions. HOW CAN I REDUCE MY RISK OF HAVING A STROKE?   Stay physically active. Get at least 30 minutes of activity on most or all days.  Do not smoke. It may also be helpful to avoid exposure to secondhand smoke.  Limit alcohol use. Moderate alcohol use is considered to be:  No more than 2 drinks per day for men.  No more than 1 drink per day for nonpregnant women.  Eat healthy foods. This involves:  Eating 5 or more servings of fruits and vegetables a day.  Making dietary changes that address high blood pressure (hypertension), high cholesterol, diabetes, or obesity.  Manage your cholesterol levels.  Making food choices that are high in fiber and low in saturated fat, trans fat, and cholesterol may control cholesterol levels.  Take any prescribed medicines to control cholesterol as directed by your health care provider.  Manage your diabetes.  Controlling your carbohydrate and sugar intake is recommended to manage diabetes.  Take any prescribed medicines to control diabetes as directed by your health care provider.  Control your hypertension.  Making food choices that are low in salt (sodium), saturated fat, trans fat, and cholesterol is recommended to manage hypertension.  Ask your health care provider if you need treatment to lower your blood pressure. Take any prescribed medicines to control hypertension as directed by your health care provider.  If you are 18-39 years of age, have your blood pressure checked every 3-5 years. If you are 40 years of age or older, have your blood pressure checked every year.  Maintain a healthy weight.  Reducing calorie intake and making food choices that are low in sodium, saturated fat, trans fat, and cholesterol are recommended to manage  weight.  Stop drug abuse.  Avoid taking birth control pills.  Talk to your health care provider about the risks of taking birth control pills if you are over 35 years old, smoke, get migraines, or have ever had a blood clot.  Get evaluated for sleep disorders (sleep apnea).  Talk to your health care provider about getting a sleep evaluation if you snore a lot or have excessive sleepiness.  Take medicines only as directed by your health care provider.  For some people, aspirin or blood thinners (anticoagulants) are helpful in reducing the risk of forming abnormal blood clots that can lead to stroke. If you have the irregular heart rhythm of atrial fibrillation, you should be on a blood thinner unless there is a good reason you cannot take them.  Understand all your medicine instructions.  Make sure that other conditions (such as anemia or atherosclerosis) are addressed. SEEK IMMEDIATE MEDICAL CARE IF:   You have sudden weakness or numbness of the face, arm, or leg, especially on one side of the body.  Your face or eyelid droops to one side.  You have sudden confusion.  You have trouble speaking (aphasia) or understanding.  You have sudden trouble seeing in one or both eyes.  You have sudden trouble walking.  You have dizziness.  You have a loss of balance or coordination.  You have a sudden, severe headache with no known cause.  You have new chest pain or an irregular heartbeat. Any of these symptoms may represent a serious problem that is an emergency. Do not wait to see if the symptoms will   go away. Get medical help at once. Call your local emergency services (911 in [place] Do not drive yourself to the hospital.   This information is not intended to replace advice given to you by your health care provider. Make sure you discuss any questions you have with your health care provider.   Document Released: 07/03/2004 Document Revised: 06/16/2014 Document Reviewed:  11/26/2012 Elsevier Interactive Patient Education 2016 [employer]  

## 2016-03-10 NOTE — Progress Notes (Signed)
Chief Complaint: Follow up Extracranial Carotid Artery Stenosis   History of Present Illness  Richard Burgess is a 82 y.o. male patient of Dr. Trula Slade patient is back today for follow-up of his extracranial carotid artery occlusive disease.   The patient denies any history of TIA or stroke symptoms, specifically the patient denies a history of amaurosis fugax or monocular blindness, denies a history unilateral  of facial drooping, denies a history of hemiplegia, and denies a history of receptive or expressive aphasia.    He continues to be a nonsmoker. His hypertension is treated with an ARB. He is on single agent antiplatelet therapy with a baby aspirin.    Pt denies claudication symptoms with walking.   The patient denies New Medical or Surgical History  Pt Diabetic: no Pt smoker: former smoker, quit in the 1960's, smoked for about 20 years, wife smokes outside the house  Pt meds include: Statin : no, he had arthralgias to a statin, he takes Welchol and red yeast rice ASA: yes Other anticoagulants/antiplatelets: no   Past Medical History:  Diagnosis Date  . Allergy   . BPH (benign prostatic hypertrophy)   . Carotid artery occlusion   . Diverticulosis of colon   . ED (erectile dysfunction)   . GERD (gastroesophageal reflux disease)   . Glucose intolerance (impaired glucose tolerance)   . History of pancreatitis   . Hx-TIA (transient ischemic attack)   . Hyperlipidemia   . Hypertension   . Hypertention, malignant, with acute intensive management   . Low back pain   . Osteoarthritis   . PVD (peripheral vascular disease) ([place])    Bilateral carotid  . Tubular adenoma of colon 08/1991   One with carcinoma IN [facility] 1993, multiple adenomatous    Social History Social History  Substance Use Topics  . Smoking status: Former Smoker    Years: 20.00    Types: Cigarettes  . Smokeless tobacco: Never Used     Comment: Daily caffeine use, regular exercise  . Alcohol  use 4.2 - 8.4 oz/week    7 - 14 Standard drinks or equivalent per week     Comment: occ beer    Family History Family History  Problem Relation Age of Onset  . Dementia Father   . Diabetes Mother   . Peripheral vascular disease Brother   . Colon cancer Sister     dx in her 65's    Surgical History Past Surgical History:  Procedure Laterality Date  . APPENDECTOMY    . BASAL CELL CARCINOMA EXCISION    . CHOLECYSTECTOMY    . COLONOSCOPY    . ESOPHAGOGASTRODUODENOSCOPY    . LUMBAR SPINE SURGERY     X4    Allergies  Allergen Reactions  . Amlodipine     Weakness in legs  . Losartan Other (See Comments)    Leg numbness/stiffness.   . Oxycodone     crazy  . Pravastatin Sodium     REACTION: bad dreams  . Rosuvastatin     REACTION: palpitations  . Statins     REACTION: aches    Current Outpatient Prescriptions  Medication Sig Dispense Refill  . aspirin 81 MG tablet Take 81 mg by mouth daily.      . Cholecalciferol (SM VITAMIN D3) 1000 UNITS tablet Take 1,000 Units by mouth daily.      . colesevelam (WELCHOL) 625 MG tablet Take 2 tablets (1,250 mg total) by mouth 2 (two) times daily with a meal. 360  tablet 3  . Multiple Vitamins-Minerals (VISION-VITE PRESERVE PO) Take 1 tablet by mouth daily.     . pantoprazole (PROTONIX) 40 MG tablet Take 1 tablet (40 mg total) by mouth daily. 90 tablet 1  . sildenafil (VIAGRA) 100 MG tablet Take 1 tablet (100 mg total) by mouth as needed. 90 tablet 3  . traMADol (ULTRAM) 50 MG tablet TAKE 1 TABLET BY MOUTH EVERY 8 HOURS AS NEEDED 90 tablet 2  . traZODone (DESYREL) 50 MG tablet Reported on 09/05/2015    . triamcinolone cream (KENALOG) 0.1 % APPLY TO AFFECTED AREA UP TO TWICE A DAY AS NEEDED NOT TO FACE GROIN UNDERARMS  3  . triamcinolone ointment (KENALOG) 0.5 % Apply 1 application topically 2 (two) times daily. 60 g 1  . zolpidem (AMBIEN) 5 MG tablet TAKE 1 TABLET BY MOUTH AT BEDTIME AS NEEDED FOR SLEEP 30 tablet 3  . losartan (COZAAR)  100 MG tablet Take 1 tablet (100 mg total) by mouth daily. (Patient not taking: Reported on 03/10/2016) 90 tablet 1  . Red Yeast Rice 600 MG CAPS Take 1 each by mouth 2 (two) times daily.    Marland Kitchen terbinafine (LAMISIL) 250 MG tablet daily.  0   Current Facility-Administered Medications  Medication Dose Route Frequency Provider Last Rate Last Dose  . methylPREDNISolone acetate (DEPO-MEDROL) injection 80 mg  80 mg Intra-articular Once Cassandria Anger, MD        Review of Systems : See HPI for pertinent positives and negatives.  Physical Examination  Vitals:   03/10/16 0948 03/10/16 0950  BP: (!) 150/74 140/70  Pulse: 63   Resp: 16   Temp: 98.3 F (36.8 C)   TempSrc: Oral   SpO2: 97%   Height: 5\' 9"  (1.753 m)    There is no height or weight on file to calculate BMI.  General: WDWN obese male in NAD GAIT: normal Eyes: PERRLA Pulmonary: Respirations are non-labored, CTAB, no rales, rhonchi, or wheezing.  Cardiac: regular rhythm, no detected murmur.  VASCULAR EXAM Carotid Bruits Right Left   Negative Negative    Aorta is not palpable. Radial pulses are 2+ palpable and equal.                                                                                                                                          LE Pulses Right Left       POPLITEAL  not palpable  not palpable       POSTERIOR TIBIAL   palpable   palpable       DORSALIS PEDIS      ANTERIOR TIBIAL  not palpable  not palpable    Gastrointestinal: soft, nontender, BS WNL, no r/g, no palpable masses.  Musculoskeletal: no muscle atrophy/wasting. M/S 5/5 throughout, extremities without ischemic changes. 1+ pitting and non pitting edema in both ankles.   Neurologic: A&O X 3; Appropriate  Affect, Speech is normal CN 2-12 intact, pain and light touch intact in extremities, motor exam as listed above.    Assessment: Richard Burgess is a 82 y.o. male  who has no history of stroke or TIA. Fortunately he does  not have DM. His atherosclerotic risk factors include remote history of smoking and obesity. He is statin intolerant, he takes a daily 81 mg ASA.    DATA Today's carotid duplex suggests 60 - 79 % right ICA stenosis, and <40% left ICA stenosis. Right vertebral artery could not be identified. Left vertebral artery is antegrade (normal). No significant change compared to the exam of 09/05/15.   Plan: Follow-up in 6 months with Carotid Duplex scan.   I discussed in depth with the patient the nature of atherosclerosis, and emphasized the importance of maximal medical management including strict control of blood pressure, blood glucose, and lipid levels, obtaining regular exercise, and continued cessation of smoking.  The patient is aware that without maximal medical management the underlying atherosclerotic disease process will progress, limiting the benefit of any interventions. The patient was given information about stroke prevention and what symptoms should prompt the patient to seek immediate medical care. Thank you for allowing [place] to participate in this patient's care.  Clemon Chambers, RN, MSN, FNP-C [facility] Office: [facility] Physician: Trula Slade  03/10/16 10:36 AM

## 2016-03-12 ENCOUNTER — Telehealth: Payer: Self-pay | Admitting: Internal Medicine

## 2016-03-12 NOTE — Telephone Encounter (Signed)
See below based on his 02/18/16 labs. Please advise if he can just take his Cholestoff.      Notes Recorded by Cresenciano Lick, CMA on 02/21/2016 at 3:49 PM EDT Pt informed ------  Notes Recorded by Cassandria Anger, MD on 02/18/2016 at 10:39 PM EDT Erline Levine,  Please inform patient that all labs are normal except for elev lipids Cont w/red rice yeast Thx

## 2016-03-12 NOTE — Telephone Encounter (Signed)
Wife has called in regard to patient recent labs.  States patient has not taken Red Rice Yeast in over two years.  States there is a reason he can't take it but can not remember.  Would like to know what patient should do in regard to elevated lipids.

## 2016-03-12 NOTE — Telephone Encounter (Signed)
States patient is taking something called "Cholesterol Off".

## 2016-03-31 DIAGNOSIS — M270 Developmental disorders of jaws: Secondary | ICD-10-CM | POA: Diagnosis not present

## 2016-05-06 NOTE — Addendum Note (Signed)
Addended by: Lianne Cure A on: 05/06/2016 10:28 AM   Modules accepted: Orders

## 2016-05-14 ENCOUNTER — Encounter: Payer: PPO | Admitting: Internal Medicine

## 2016-05-21 ENCOUNTER — Other Ambulatory Visit: Payer: Self-pay | Admitting: *Deleted

## 2016-05-21 MED ORDER — PANTOPRAZOLE SODIUM 40 MG PO TBEC: 40.0000 mg | DELAYED_RELEASE_TABLET | Freq: Every day | ORAL | 3 refills | Status: DC

## 2016-05-21 NOTE — Telephone Encounter (Signed)
Pls stay on Pantaprazole Thx

## 2016-05-21 NOTE — Telephone Encounter (Signed)
Pt's wife is requesting refill on Omeprazole 20 mg. 90 day supply.   Current med list states he takes Pantoprazole 40 mg.   Ok to Rf Omeprazole?

## 2016-05-23 NOTE — Telephone Encounter (Signed)
Notified pt/wife w/MD response../lmb 

## 2016-06-11 ENCOUNTER — Ambulatory Visit (INDEPENDENT_AMBULATORY_CARE_PROVIDER_SITE_OTHER): Payer: PPO | Admitting: Internal Medicine

## 2016-06-11 ENCOUNTER — Encounter: Payer: Self-pay | Admitting: Internal Medicine

## 2016-06-11 VITALS — BP 138/70 | HR 67 | Ht 69.0 in | Wt 200.0 lb

## 2016-06-11 DIAGNOSIS — Z0000 Encounter for general adult medical examination without abnormal findings: Secondary | ICD-10-CM | POA: Diagnosis not present

## 2016-06-11 DIAGNOSIS — G459 Transient cerebral ischemic attack, unspecified: Secondary | ICD-10-CM

## 2016-06-11 DIAGNOSIS — I10 Essential (primary) hypertension: Secondary | ICD-10-CM | POA: Diagnosis not present

## 2016-06-11 DIAGNOSIS — E538 Deficiency of other specified B group vitamins: Secondary | ICD-10-CM

## 2016-06-11 MED ORDER — LOSARTAN POTASSIUM 100 MG PO TABS: 100.0000 mg | ORAL_TABLET | Freq: Every day | ORAL | 1 refills | Status: DC

## 2016-06-11 NOTE — Progress Notes (Signed)
Pre visit review using our clinic review tool, if applicable. No additional management support is needed unless otherwise documented below in the visit note. 

## 2016-06-11 NOTE — Progress Notes (Signed)
Subjective:  Patient ID: Richard Burgess, male    DOB: 02-Dec-1928  Age: 83 y.o. MRN: [id number]  CC: No chief complaint on file.   HPI Richard Burgess presents for a well exam F/u HTN - not taking Losartan  Outpatient Medications Prior to Visit  Medication Sig Dispense Refill  . aspirin 81 MG tablet Take 81 mg by mouth daily.      . Cholecalciferol (SM VITAMIN D3) 1000 UNITS tablet Take 1,000 Units by mouth daily.      . colesevelam (WELCHOL) 625 MG tablet Take 2 tablets (1,250 mg total) by mouth 2 (two) times daily with a meal. 360 tablet 3  . Multiple Vitamins-Minerals (VISION-VITE PRESERVE PO) Take 1 tablet by mouth daily.     . pantoprazole (PROTONIX) 40 MG tablet Take 1 tablet (40 mg total) by mouth daily. 90 tablet 3  . Red Yeast Rice 600 MG CAPS Take 1 each by mouth 2 (two) times daily.    . sildenafil (VIAGRA) 100 MG tablet Take 1 tablet (100 mg total) by mouth as needed. 90 tablet 3  . terbinafine (LAMISIL) 250 MG tablet daily.  0  . traMADol (ULTRAM) 50 MG tablet TAKE 1 TABLET BY MOUTH EVERY 8 HOURS AS NEEDED 90 tablet 2  . traZODone (DESYREL) 50 MG tablet Reported on 09/05/2015    . triamcinolone cream (KENALOG) 0.1 % APPLY TO AFFECTED AREA UP TO TWICE A DAY AS NEEDED NOT TO FACE GROIN UNDERARMS  3  . triamcinolone ointment (KENALOG) 0.5 % Apply 1 application topically 2 (two) times daily. 60 g 1  . zolpidem (AMBIEN) 5 MG tablet TAKE 1 TABLET BY MOUTH AT BEDTIME AS NEEDED FOR SLEEP 30 tablet 3  . losartan (COZAAR) 100 MG tablet Take 1 tablet (100 mg total) by mouth daily. (Patient not taking: Reported on 06/11/2016) 90 tablet 1   Facility-Administered Medications Prior to Visit  Medication Dose Route Frequency Provider Last Rate Last Dose  . methylPREDNISolone acetate (DEPO-MEDROL) injection 80 mg  80 mg Intra-articular Once Cassandria Anger, MD        ROS Review of Systems  Constitutional: Negative for appetite change, fatigue and unexpected weight change.  HENT:  Negative for congestion, nosebleeds, sneezing, sore throat and trouble swallowing.   Eyes: Negative for itching and visual disturbance.  Respiratory: Negative for cough.   Cardiovascular: Negative for chest pain, palpitations and leg swelling.  Gastrointestinal: Negative for abdominal distention, blood in stool, diarrhea and nausea.  Genitourinary: Negative for frequency and hematuria.  Musculoskeletal: Positive for arthralgias. Negative for back pain, gait problem, joint swelling and neck pain.  Skin: Negative for rash.  Neurological: Negative for dizziness, tremors, speech difficulty and weakness.  Psychiatric/Behavioral: Negative for agitation, dysphoric mood, sleep disturbance and suicidal ideas. The patient is not nervous/anxious.     Objective:  BP 138/70   Pulse 67   Ht 5\' 9"  (1.753 m)   Wt 200 lb (90.7 kg)   SpO2 95%   BMI 29.53 kg/m   BP Readings from Last 3 Encounters:  06/11/16 138/70  03/10/16 140/70  02/18/16 (!) 148/70    Wt Readings from Last 3 Encounters:  06/11/16 200 lb (90.7 kg)  02/18/16 208 lb (94.3 kg)  11/21/15 208 lb (94.3 kg)    Physical Exam  Constitutional: He is oriented to person, place, and time. He appears well-developed and well-nourished. No distress.  HENT:  Head: Normocephalic and atraumatic.  Right Ear: External ear normal.  Left Ear: External  ear normal.  Nose: Nose normal.  Mouth/Throat: Oropharynx is clear and moist. No oropharyngeal exudate.  Eyes: Conjunctivae and EOM are normal. Pupils are equal, round, and reactive to light. Right eye exhibits no discharge. Left eye exhibits no discharge. No scleral icterus.  Neck: Normal range of motion. Neck supple. No JVD present. No tracheal deviation present. No thyromegaly present.  Cardiovascular: Normal rate, regular rhythm, normal heart sounds and intact distal pulses.  Exam reveals no gallop and no friction rub.   No murmur heard. Pulmonary/Chest: Effort normal and breath sounds normal.  No stridor. No respiratory distress. He has no wheezes. He has no rales. He exhibits no tenderness.  Abdominal: Soft. Bowel sounds are normal. He exhibits no distension and no mass. There is no tenderness. There is no rebound and no guarding.  Musculoskeletal: Normal range of motion. He exhibits no edema or tenderness.  Lymphadenopathy:    He has no cervical adenopathy.  Neurological: He is alert and oriented to person, place, and time. He has normal reflexes. No cranial nerve deficit. He exhibits normal muscle tone. Coordination normal.  Skin: Skin is warm and dry. Rash noted. He is not diaphoretic. No erythema. No pallor.  Psychiatric: He has a normal mood and affect. His behavior is normal. Judgment and thought content normal.  AKs Rect - declined  Lab Results  Component Value Date   WBC 7.7 10/30/2014   HGB 14.8 10/30/2014   HCT 43.3 10/30/2014   PLT 198.0 10/30/2014   GLUCOSE 118 (H) 02/18/2016   CHOL 220 (H) 02/18/2016   TRIG 233.0 (H) 02/18/2016   HDL 37.50 (L) 02/18/2016   LDLDIRECT 153.0 02/18/2016   LDLCALC 91 04/22/2010   ALT 15 02/18/2016   AST 15 02/18/2016   NA 136 02/18/2016   K 4.9 02/18/2016   CL 103 02/18/2016   CREATININE 0.94 02/18/2016   BUN 19 02/18/2016   CO2 28 02/18/2016   TSH 2.88 02/18/2016   PSA 0.28 03/23/2014   HGBA1C 6.5 08/01/2013   MICROALBUR 0.2 12/13/2007    No results found.  Assessment & Plan:   There are no diagnoses linked to this encounter. I am having Richard Burgess maintain his aspirin, Cholecalciferol, colesevelam, sildenafil, Red Yeast Rice, triamcinolone ointment, Multiple Vitamins-Minerals (VISION-VITE PRESERVE PO), zolpidem, traZODone, terbinafine, triamcinolone cream, traMADol, losartan, pantoprazole, and HYDROcodone-acetaminophen. We will continue to administer methylPREDNISolone acetate.  Meds ordered this encounter  Medications  . HYDROcodone-acetaminophen (NORCO/VICODIN) 5-325 MG tablet    Sig: as needed.    Refill:  0      Follow-up: No Follow-up on file.  Richard Kehr, MD

## 2016-06-11 NOTE — Assessment & Plan Note (Signed)
BP Readings from Last 3 Encounters:  06/11/16 138/70  03/10/16 140/70  02/18/16 (!) 148/70  Stay off Amlodipine

## 2016-06-11 NOTE — Assessment & Plan Note (Signed)
Here for medicare wellness/physical  Diet: heart healthy  Physical activity: not sedentary  Depression/mood screen: negative  Hearing: intact to whispered voice  Visual acuity: grossly normal, performs annual eye exam - reading glasses ADLs: capable  Fall risk: low to none  Home safety: good  Cognitive evaluation: intact to orientation, naming, recall and repetition  EOL planning: adv directives, full code/ I agree  I have personally reviewed and have noted  1. The patient's medical, surgical and social history  2. Their use of alcohol, tobacco or illicit drugs  3. Their current medications and supplements  4. The patient's functional ability including ADL's, fall risks, home safety risks and hearing or visual impairment.  5. Diet and physical activities  6. Evidence for depression or mood disorders 7. The roster of all physicians providing medical care to patient - is listed in the Snapshot section of the chart and reviewed today.    Today patient counseled on age appropriate routine health concerns for screening and prevention, each reviewed and up to date or declined. Immunizations reviewed and up to date or declined. Labs ordered and reviewed. Risk factors for depression reviewed and negative. Hearing function and visual acuity are intact. ADLs screened and addressed as needed. Functional ability and level of safety reviewed and appropriate. Education, counseling and referrals performed based on assessed risks today. Patient provided with a copy of personalized plan for preventive services.

## 2016-06-11 NOTE — Patient Instructions (Signed)

## 2016-06-11 NOTE — Assessment & Plan Note (Signed)
On B12 

## 2016-06-11 NOTE — Assessment & Plan Note (Signed)
Losartan - re-start ASA, Red Rice yeast

## 2016-07-03 ENCOUNTER — Telehealth: Payer: Self-pay | Admitting: Internal Medicine

## 2016-07-03 NOTE — Telephone Encounter (Signed)
Attempted to call patient to schedule awv. Patient's wife asked what an awv is. Explained awv, patient's wife stated that he would not be interested in scheduling an appt at this time.

## 2016-08-23 ENCOUNTER — Emergency Department (HOSPITAL_BASED_OUTPATIENT_CLINIC_OR_DEPARTMENT_OTHER)
Admission: EM | Admit: 2016-08-23 | Discharge: 2016-08-23 | Disposition: A | Discharge status: Home / Self Care | Payer: PPO | Attending: Emergency Medicine | Admitting: Emergency Medicine

## 2016-08-23 ENCOUNTER — Encounter (HOSPITAL_BASED_OUTPATIENT_CLINIC_OR_DEPARTMENT_OTHER): Payer: Self-pay | Admitting: *Deleted

## 2016-08-23 DIAGNOSIS — Z87891 Personal history of nicotine dependence: Secondary | ICD-10-CM | POA: Diagnosis not present

## 2016-08-23 DIAGNOSIS — I10 Essential (primary) hypertension: Secondary | ICD-10-CM | POA: Diagnosis not present

## 2016-08-23 DIAGNOSIS — B029 Zoster without complications: Secondary | ICD-10-CM | POA: Diagnosis not present

## 2016-08-23 DIAGNOSIS — Z7982 Long term (current) use of aspirin: Secondary | ICD-10-CM | POA: Insufficient documentation

## 2016-08-23 DIAGNOSIS — Z79899 Other long term (current) drug therapy: Secondary | ICD-10-CM | POA: Insufficient documentation

## 2016-08-23 DIAGNOSIS — R21 Rash and other nonspecific skin eruption: Secondary | ICD-10-CM | POA: Diagnosis not present

## 2016-08-23 MED ORDER — HYDROCODONE-ACETAMINOPHEN 5-325 MG PO TABS: 0.5000 | ORAL_TABLET | Freq: Four times a day (QID) | ORAL | 0 refills | Status: DC | PRN

## 2016-08-23 MED ORDER — VALACYCLOVIR HCL 1 G PO TABS: 1000.0000 mg | ORAL_TABLET | Freq: Three times a day (TID) | ORAL | 0 refills | Status: DC

## 2016-08-23 NOTE — ED Notes (Signed)
PT discharged to home with family. NAD. 
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
## 2016-08-23 NOTE — ED Triage Notes (Signed)
Pt noted to have multiple 'knots' on his posterior head behind his left ear.  Reports burning sensation over knots.  No blisters noted, no drainage noted.

## 2016-08-27 ENCOUNTER — Encounter: Payer: Self-pay | Admitting: Internal Medicine

## 2016-08-27 ENCOUNTER — Ambulatory Visit (INDEPENDENT_AMBULATORY_CARE_PROVIDER_SITE_OTHER): Payer: PPO | Admitting: Internal Medicine

## 2016-08-27 DIAGNOSIS — G8929 Other chronic pain: Secondary | ICD-10-CM | POA: Diagnosis not present

## 2016-08-27 DIAGNOSIS — E538 Deficiency of other specified B group vitamins: Secondary | ICD-10-CM

## 2016-08-27 DIAGNOSIS — B029 Zoster without complications: Secondary | ICD-10-CM

## 2016-08-27 DIAGNOSIS — G459 Transient cerebral ischemic attack, unspecified: Secondary | ICD-10-CM

## 2016-08-27 DIAGNOSIS — M25512 Pain in left shoulder: Secondary | ICD-10-CM

## 2016-08-27 MED ORDER — METHYLPREDNISOLONE ACETATE 80 MG/ML IJ SUSP
80.0000 mg | Freq: Once | INTRAMUSCULAR | Status: AC
  Administered 2016-08-27: 80 mg via INTRA_ARTICULAR

## 2016-08-27 MED ORDER — TRAMADOL HCL 50 MG PO TABS: 50.0000 mg | ORAL_TABLET | Freq: Three times a day (TID) | ORAL | 2 refills | Status: DC | PRN

## 2016-08-27 NOTE — Assessment & Plan Note (Signed)
On B12 

## 2016-08-27 NOTE — Assessment & Plan Note (Signed)
Finish Valtrex

## 2016-08-27 NOTE — Progress Notes (Signed)
Pre-visit discussion using our clinic review tool. No additional management support is needed unless otherwise documented below in the visit note.  

## 2016-08-27 NOTE — Progress Notes (Signed)
Subjective:  Patient ID: Richard Burgess, male    DOB: 1928-08-02  Age: 83 y.o. MRN: [id number]  CC: Hospitalization Follow-up (shingles, pain left neck to top of head....); Shoulder Pain (left , popping sound hurts to raise up ); and Hypertension (stable )   HPI Richard Burgess presents for shingles f/u C/o L shoulder pain F/u HTN, OA  Outpatient Medications Prior to Visit  Medication Sig Dispense Refill  . aspirin 81 MG tablet Take 81 mg by mouth daily.      . Cholecalciferol (SM VITAMIN D3) 1000 UNITS tablet Take 1,000 Units by mouth daily.      . colesevelam (WELCHOL) 625 MG tablet Take 2 tablets (1,250 mg total) by mouth 2 (two) times daily with a meal. 360 tablet 3  . HYDROcodone-acetaminophen (NORCO/VICODIN) 5-325 MG tablet Take 0.5-1 tablets by mouth every 6 (six) hours as needed. 8 tablet 0  . losartan (COZAAR) 100 MG tablet Take 1 tablet (100 mg total) by mouth daily. 90 tablet 1  . Multiple Vitamins-Minerals (VISION-VITE PRESERVE PO) Take 1 tablet by mouth daily.     . pantoprazole (PROTONIX) 40 MG tablet Take 1 tablet (40 mg total) by mouth daily. 90 tablet 3  . Red Yeast Rice 600 MG CAPS Take 1 each by mouth 2 (two) times daily.    . sildenafil (VIAGRA) 100 MG tablet Take 1 tablet (100 mg total) by mouth as needed. 90 tablet 3  . terbinafine (LAMISIL) 250 MG tablet daily.  0  . traMADol (ULTRAM) 50 MG tablet TAKE 1 TABLET BY MOUTH EVERY 8 HOURS AS NEEDED 90 tablet 2  . traZODone (DESYREL) 50 MG tablet Reported on 09/05/2015    . triamcinolone cream (KENALOG) 0.1 % APPLY TO AFFECTED AREA UP TO TWICE A DAY AS NEEDED NOT TO FACE GROIN UNDERARMS  3  . triamcinolone ointment (KENALOG) 0.5 % Apply 1 application topically 2 (two) times daily. 60 g 1  . valACYclovir (VALTREX) 1000 MG tablet Take 1 tablet (1,000 mg total) by mouth 3 (three) times daily. 21 tablet 0  . zolpidem (AMBIEN) 5 MG tablet TAKE 1 TABLET BY MOUTH AT BEDTIME AS NEEDED FOR SLEEP 30 tablet 3    Facility-Administered Medications Prior to Visit  Medication Dose Route Frequency Provider Last Rate Last Dose  . methylPREDNISolone acetate (DEPO-MEDROL) injection 80 mg  80 mg Intra-articular Once Aleksei Plotnikov V, MD        ROS Review of Systems  Constitutional: Negative for appetite change, fatigue and unexpected weight change.  HENT: Negative for congestion, nosebleeds, sneezing, sore throat and trouble swallowing.   Eyes: Negative for itching and visual disturbance.  Respiratory: Negative for cough.   Cardiovascular: Negative for chest pain, palpitations and leg swelling.  Gastrointestinal: Negative for abdominal distention, blood in stool, diarrhea and nausea.  Genitourinary: Negative for frequency and hematuria.  Musculoskeletal: Positive for arthralgias. Negative for back pain, gait problem, joint swelling and neck pain.  Skin: Positive for rash.  Neurological: Negative for dizziness, tremors, speech difficulty and weakness.  Psychiatric/Behavioral: Negative for agitation, dysphoric mood and sleep disturbance. The patient is not nervous/anxious.     Objective:  BP 126/86   Pulse 78   Temp 98.6 F (37 C) (Oral)   Resp 16   Ht 5\' 10"  (1.778 m)   Wt 203 lb 8 oz (92.3 kg)   SpO2 96%   BMI 29.20 kg/m   BP Readings from Last 3 Encounters:  08/27/16 126/86  08/23/16 (!) 141/74  06/11/16 138/70    Wt Readings from Last 3 Encounters:  08/27/16 203 lb 8 oz (92.3 kg)  08/23/16 195 lb (88.5 kg)  06/11/16 200 lb (90.7 kg)    Physical Exam  Constitutional: He is oriented to person, place, and time. He appears well-developed. No distress.  NAD  HENT:  Mouth/Throat: Oropharynx is clear and moist.  Eyes: Conjunctivae are normal. Pupils are equal, round, and reactive to light.  Neck: Normal range of motion. No JVD present. No thyromegaly present.  Cardiovascular: Normal rate, regular rhythm, normal heart sounds and intact distal pulses.  Exam reveals no gallop and no  friction rub.   No murmur heard. Pulmonary/Chest: Effort normal and breath sounds normal. No respiratory distress. He has no wheezes. He has no rales. He exhibits no tenderness.  Abdominal: Soft. Bowel sounds are normal. He exhibits no distension and no mass. There is no tenderness. There is no rebound and no guarding.  Musculoskeletal: Normal range of motion. He exhibits tenderness. He exhibits no edema.  Lymphadenopathy:    He has no cervical adenopathy.  Neurological: He is alert and oriented to person, place, and time. He has normal reflexes. No cranial nerve deficit. He exhibits normal muscle tone. He displays a negative Romberg sign. Coordination and gait normal.  Skin: Skin is warm and dry. Rash noted.  Psychiatric: He has a normal mood and affect. His behavior is normal. Judgment and thought content normal.  Rash behind L ear L shoulder - tender w/ROM   Procedure :Joint Injection,  L shoulder   Indication:  Subacromial bursitis with refractory  chronic pain.   Risks including unsuccessful procedure , bleeding, infection, bruising, skin atrophy, "steroid flare-up" and others were explained to the patient in detail as well as the benefits. Informed consent was obtained and signed.   Tthe patient was placed in a comfortable position. Lateral approach was used. Skin was prepped with Betadine and alcohol  and anesthetized with a cooling spray. Then, a 5 cc syringe with a 2 inch long 24-gauge needle was used for a joint injection.. The needle was advanced  Into the subacromial space.The bursa was injected with 3 mL of 2% lidocaine and 40 mg of Depo-Medrol .  Band-Aid was applied.   Tolerated well. Complications: None. Good pain relief following the procedure.   Postprocedure instructions :    A Band-Aid should be left on for 12 hours. Injection therapy is not a cure itself. It is used in conjunction with other modalities. You can use nonsteroidal anti-inflammatories like ibuprofen , hot  and cold compresses. Rest is recommended in the next 24 hours. You need to report immediately  if fever, chills or any signs of infection develop.    Lab Results  Component Value Date   WBC 7.7 10/30/2014   HGB 14.8 10/30/2014   HCT 43.3 10/30/2014   PLT 198.0 10/30/2014   GLUCOSE 118 (H) 02/18/2016   CHOL 220 (H) 02/18/2016   TRIG 233.0 (H) 02/18/2016   HDL 37.50 (L) 02/18/2016   LDLDIRECT 153.0 02/18/2016   LDLCALC 91 04/22/2010   ALT 15 02/18/2016   AST 15 02/18/2016   NA 136 02/18/2016   K 4.9 02/18/2016   CL 103 02/18/2016   CREATININE 0.94 02/18/2016   BUN 19 02/18/2016   CO2 28 02/18/2016   TSH 2.88 02/18/2016   PSA 0.28 03/23/2014   HGBA1C 6.5 08/01/2013   MICROALBUR 0.2 12/13/2007    No results found.  Assessment & Plan:   There  are no diagnoses linked to this encounter. I am having Mr. Mikami maintain his aspirin, Cholecalciferol, colesevelam, sildenafil, Red Yeast Rice, triamcinolone ointment, Multiple Vitamins-Minerals (VISION-VITE PRESERVE PO), zolpidem, traZODone, terbinafine, triamcinolone cream, traMADol, pantoprazole, losartan, valACYclovir, and HYDROcodone-acetaminophen. We will continue to administer methylPREDNISolone acetate.  No orders of the defined types were placed in this encounter.    Follow-up: No Follow-up on file.  [name], MD

## 2016-08-27 NOTE — Patient Instructions (Signed)
Postprocedure instructions :    A Band-Aid should be left on for 12 hours. Injection therapy is not a cure itself. It is used in conjunction with other modalities. You can use nonsteroidal anti-inflammatories like ibuprofen , hot and cold compresses. Rest is recommended in the next 24 hours. You need to report immediately  if fever, chills or any signs of infection develop. 

## 2016-08-27 NOTE — Assessment & Plan Note (Signed)
No relapse Losartan, ASA

## 2016-08-27 NOTE — Addendum Note (Signed)
Addended by: Valere Dross on: 08/27/2016 09:52 AM   Modules accepted: Orders

## 2016-08-27 NOTE — Assessment & Plan Note (Signed)
Worse Will inject w/steroids

## 2016-08-29 ENCOUNTER — Telehealth: Payer: Self-pay | Admitting: Internal Medicine

## 2016-08-29 NOTE — Telephone Encounter (Signed)
Routing to dr plotnikov, please advise, thanks 

## 2016-08-29 NOTE — Telephone Encounter (Signed)
Patient states Dr. Camila Li gave him an injection in his shoulder on Wed.  Patient states he is still hurting back.  Patient is requesting another injection.  Please follow up in regard.

## 2016-08-29 NOTE — Telephone Encounter (Signed)
Advised patient's wife, mildred (on DPR)---she will let patient know

## 2016-08-29 NOTE — Telephone Encounter (Signed)
It may take longer for the shot to kick in We can't do another inj x 3 mo Use ice and heat prn Use Tramadol prn Thx

## 2016-09-03 ENCOUNTER — Encounter: Payer: Self-pay | Admitting: Family

## 2016-09-10 ENCOUNTER — Encounter: Payer: Self-pay | Admitting: Internal Medicine

## 2016-09-10 ENCOUNTER — Ambulatory Visit (INDEPENDENT_AMBULATORY_CARE_PROVIDER_SITE_OTHER): Payer: PPO | Admitting: Internal Medicine

## 2016-09-10 DIAGNOSIS — G8929 Other chronic pain: Secondary | ICD-10-CM

## 2016-09-10 DIAGNOSIS — B029 Zoster without complications: Secondary | ICD-10-CM | POA: Diagnosis not present

## 2016-09-10 DIAGNOSIS — G459 Transient cerebral ischemic attack, unspecified: Secondary | ICD-10-CM

## 2016-09-10 DIAGNOSIS — M25512 Pain in left shoulder: Secondary | ICD-10-CM

## 2016-09-10 DIAGNOSIS — I10 Essential (primary) hypertension: Secondary | ICD-10-CM | POA: Diagnosis not present

## 2016-09-10 DIAGNOSIS — E538 Deficiency of other specified B group vitamins: Secondary | ICD-10-CM | POA: Diagnosis not present

## 2016-09-10 MED ORDER — GABAPENTIN 100 MG PO CAPS: 100.0000 mg | ORAL_CAPSULE | Freq: Three times a day (TID) | ORAL | 3 refills | Status: DC | PRN

## 2016-09-10 MED ORDER — METHYLPREDNISOLONE ACETATE 80 MG/ML IJ SUSP
80.0000 mg | Freq: Once | INTRAMUSCULAR | Status: AC
  Administered 2016-09-10: 80 mg via INTRAMUSCULAR

## 2016-09-10 NOTE — Patient Instructions (Signed)
Use Tramadol and/or Gabapentin for pain

## 2016-09-10 NOTE — Assessment & Plan Note (Signed)
Tramadol prn Added Gabapentin

## 2016-09-10 NOTE — Addendum Note (Signed)
Addended by: Elta Guadeloupe on: 09/10/2016 08:52 AM   Modules accepted: Orders

## 2016-09-10 NOTE — Assessment & Plan Note (Signed)
He went home after the shot and was lifting heavy stuff and hurt it again. C/o L shoulder pain Depo-medrol  80 mg IM Sports Med ref Tramadol prn

## 2016-09-10 NOTE — Assessment & Plan Note (Signed)
On B12 

## 2016-09-10 NOTE — Assessment & Plan Note (Signed)
Losartan 

## 2016-09-10 NOTE — Progress Notes (Signed)
Subjective:  Patient ID: Richard Burgess, male    DOB: [birth year]  Age: 83 y.o. MRN: [id number]  CC: Follow-up and Shoulder Pain (Pt stated Lt shouder still painful)   HPI Richard Burgess presents for L shoulder pain. He went home after the shot and was lifting heavy stuff and hurt it again. C/o L shoulder pain F/u shingles - better, but they still hurt  Outpatient Medications Prior to Visit  Medication Sig Dispense Refill  . aspirin 81 MG tablet Take 81 mg by mouth daily.      . Cholecalciferol (SM VITAMIN D3) 1000 UNITS tablet Take 1,000 Units by mouth daily.      . colesevelam (WELCHOL) 625 MG tablet Take 2 tablets (1,250 mg total) by mouth 2 (two) times daily with a meal. 360 tablet 3  . losartan (COZAAR) 100 MG tablet Take 1 tablet (100 mg total) by mouth daily. 90 tablet 1  . Multiple Vitamins-Minerals (VISION-VITE PRESERVE PO) Take 1 tablet by mouth daily.     . pantoprazole (PROTONIX) 40 MG tablet Take 1 tablet (40 mg total) by mouth daily. 90 tablet 3  . Red Yeast Rice 600 MG CAPS Take 1 each by mouth 2 (two) times daily.    . sildenafil (VIAGRA) 100 MG tablet Take 1 tablet (100 mg total) by mouth as needed. 90 tablet 3  . terbinafine (LAMISIL) 250 MG tablet daily.  0  . traMADol (ULTRAM) 50 MG tablet Take 1 tablet (50 mg total) by mouth every 8 (eight) hours as needed. 90 tablet 2  . traZODone (DESYREL) 50 MG tablet Reported on 09/05/2015    . triamcinolone cream (KENALOG) 0.1 % APPLY TO AFFECTED AREA UP TO TWICE A DAY AS NEEDED NOT TO FACE GROIN UNDERARMS  3  . triamcinolone ointment (KENALOG) 0.5 % Apply 1 application topically 2 (two) times daily. 60 g 1  . valACYclovir (VALTREX) 1000 MG tablet Take 1 tablet (1,000 mg total) by mouth 3 (three) times daily. 21 tablet 0  . zolpidem (AMBIEN) 5 MG tablet TAKE 1 TABLET BY MOUTH AT BEDTIME AS NEEDED FOR SLEEP 30 tablet 3   Facility-Administered Medications Prior to Visit  Medication Dose Route Frequency Provider Last Rate Last  Dose  . methylPREDNISolone acetate (DEPO-MEDROL) injection 80 mg  80 mg Intra-articular Once Aleksei Plotnikov V, MD        ROS Review of Systems  Constitutional: Negative for appetite change, fatigue and unexpected weight change.  HENT: Negative for congestion, nosebleeds, sneezing, sore throat and trouble swallowing.   Eyes: Negative for itching and visual disturbance.  Respiratory: Negative for cough.   Cardiovascular: Negative for chest pain, palpitations and leg swelling.  Gastrointestinal: Negative for abdominal distention, blood in stool, diarrhea and nausea.  Genitourinary: Negative for frequency and hematuria.  Musculoskeletal: Negative for back pain, gait problem, joint swelling and neck pain.  Skin: Negative for rash.  Neurological: Negative for dizziness, tremors, speech difficulty and weakness.  Psychiatric/Behavioral: Negative for agitation, dysphoric mood and sleep disturbance. The patient is not nervous/anxious.     Objective:  BP 126/82   Pulse 66   Temp 97.6 F (36.4 C)   Ht 5\' 10"  (1.778 m)   Wt 202 lb (91.6 kg)   SpO2 98%   BMI 28.98 kg/m   BP Readings from Last 3 Encounters:  09/10/16 126/82  08/27/16 126/86  08/23/16 (!) 141/74    Wt Readings from Last 3 Encounters:  09/10/16 202 lb (91.6 kg)  08/27/16 203  lb 8 oz (92.3 kg)  08/23/16 195 lb (88.5 kg)    Physical Exam  Constitutional: He is oriented to person, place, and time. He appears well-developed. No distress.  NAD  HENT:  Mouth/Throat: Oropharynx is clear and moist.  Eyes: Conjunctivae are normal. Pupils are equal, round, and reactive to light.  Neck: Normal range of motion. No JVD present. No thyromegaly present.  Cardiovascular: Normal rate, regular rhythm, normal heart sounds and intact distal pulses.  Exam reveals no gallop and no friction rub.   No murmur heard. Pulmonary/Chest: Effort normal and breath sounds normal. No respiratory distress. He has no wheezes. He has no rales. He  exhibits no tenderness.  Abdominal: Soft. Bowel sounds are normal. He exhibits no distension and no mass. There is no tenderness. There is no rebound and no guarding.  Musculoskeletal: Normal range of motion. He exhibits tenderness. He exhibits no edema.  Lymphadenopathy:    He has no cervical adenopathy.  Neurological: He is alert and oriented to person, place, and time. He has normal reflexes. No cranial nerve deficit. He exhibits normal muscle tone. He displays a negative Romberg sign. Coordination and gait normal.  Skin: Skin is warm and dry. No rash noted.  Psychiatric: He has a normal mood and affect. His behavior is normal. Judgment and thought content normal.  L shoulder is tender w/ROM Rash has resolved  Lab Results  Component Value Date   WBC 7.7 10/30/2014   HGB 14.8 10/30/2014   HCT 43.3 10/30/2014   PLT 198.0 10/30/2014   GLUCOSE 118 (H) 02/18/2016   CHOL 220 (H) 02/18/2016   TRIG 233.0 (H) 02/18/2016   HDL 37.50 (L) 02/18/2016   LDLDIRECT 153.0 02/18/2016   LDLCALC 91 04/22/2010   ALT 15 02/18/2016   AST 15 02/18/2016   NA 136 02/18/2016   K 4.9 02/18/2016   CL 103 02/18/2016   CREATININE 0.94 02/18/2016   BUN 19 02/18/2016   CO2 28 02/18/2016   TSH 2.88 02/18/2016   PSA 0.28 03/23/2014   HGBA1C 6.5 08/01/2013   MICROALBUR 0.2 12/13/2007    No results found.  Assessment & Plan:   There are no diagnoses linked to this encounter. I am having [name] maintain his aspirin, Cholecalciferol, colesevelam, sildenafil, Red Yeast Rice, triamcinolone ointment, Multiple Vitamins-Minerals (VISION-VITE PRESERVE PO), zolpidem, traZODone, terbinafine, triamcinolone cream, pantoprazole, losartan, valACYclovir, traMADol, and HYDROcodone-acetaminophen. We will continue to administer methylPREDNISolone acetate.  Meds ordered this encounter  Medications  . HYDROcodone-acetaminophen (NORCO/VICODIN) 5-325 MG tablet     Follow-up: No Follow-up on file.  [name],  MD

## 2016-09-10 NOTE — Assessment & Plan Note (Signed)
Losartan and ASA

## 2016-09-15 ENCOUNTER — Encounter (HOSPITAL_COMMUNITY): Payer: PPO

## 2016-09-15 ENCOUNTER — Ambulatory Visit: Payer: PPO | Admitting: Family

## 2016-09-23 NOTE — Progress Notes (Signed)
Richard Burgess, Richard Burgess [id number] Phone: [phone] Subjective:    I'm seeing this patient by the request  of:  [name], MD   CC: Left shoulder pain  [id number]  Richard Burgess is a 83 y.o. male coming in with complaint of left shoulder pain. Patient states that he is had this pain for multiple months. Did see primary care provider and was given an injection in the left shoulder with no significant improvement. Patient states that if anything it seems to be worsening. Having some increasing weakness. Denies any radiation down the arm. Does have some associated neck pain but also recently had shingles in the area. Patient denies any fevers chills or any abnormal weight loss. Patient is very active but finds it difficult to continue to do some home exercises.      Past Medical History:  Diagnosis Date  . Allergy   . BPH (benign prostatic hypertrophy)   . Carotid artery occlusion   . Diverticulosis of colon   . ED (erectile dysfunction)   . GERD (gastroesophageal reflux disease)   . Glucose intolerance (impaired glucose tolerance)   . History of pancreatitis   . Hx-TIA (transient ischemic attack)   . Hyperlipidemia   . Hypertension   . Hypertention, malignant, with acute intensive management   . Low back pain   . Osteoarthritis   . PVD (peripheral vascular disease) ([place])    Bilateral carotid  . Tubular adenoma of colon 08/1991   One with carcinoma IN [facility] 1993, multiple adenomatous   Past Surgical History:  Procedure Laterality Date  . APPENDECTOMY    . BASAL CELL CARCINOMA EXCISION    . CHOLECYSTECTOMY    . COLONOSCOPY    . ESOPHAGOGASTRODUODENOSCOPY    . LUMBAR SPINE SURGERY     X4   Social History   Social History  . Marital status: Married    Spouse name: N/A  . Number of children: 3  . Years of education: N/A   Occupational History  . Retired-Construction    Social History Main Topics  . Smoking  status: Former Smoker    Years: 20.00    Types: Cigarettes  . Smokeless tobacco: Never Used     Comment: Daily caffeine use, regular exercise  . Alcohol use 4.2 - 8.4 oz/week    7 - 14 Standard drinks or equivalent per week     Comment: occ beer  . Drug use: No  . Sexual activity: Yes   Other Topics Concern  . Not on file   Social History Narrative  . No narrative on file   Allergies  Allergen Reactions  . Amlodipine     Weakness in legs  . Losartan Other (See Comments)    Leg numbness/stiffness.   . Oxycodone     crazy  . Pravastatin Sodium     REACTION: bad dreams  . Rosuvastatin     REACTION: palpitations  . Statins     REACTION: aches   Family History  Problem Relation Age of Onset  . Dementia Father   . Diabetes Mother   . Peripheral vascular disease Brother   . Colon cancer Sister     dx in her 81's    Past medical history, social, surgical and family history all reviewed in electronic medical record.  No pertanent information unless stated regarding to the chief complaint.   Review of Systems:Review of systems updated and as accurate as of 09/23/16  No headache, visual changes, nausea, vomiting, diarrhea, constipation, dizziness, abdominal pain, skin rash, fevers, chills, night sweats, weight loss, swollen lymph nodes, body aches, joint swelling,  chest pain, shortness of breath, mood changes.  Positive muscle aches  Objective  There were no vitals taken for this visit. Systems examined below as of 09/23/16   General: No apparent distress alert and oriented x3 mood and affect normal, dressed appropriately.  HEENT: Pupils equal, extraocular movements intact  Respiratory: Patient's speak in full sentences and does not appear short of breath  Cardiovascular: No lower extremity edema, non tender, no erythema  Skin: Warm dry intact with no signs of infection or rash on extremities or on axial skeleton.  Abdomen: Soft nontender  Neuro: Cranial nerves II  through XII are intact, neurovascularly intact in all extremities with 2+ DTRs and 2+ pulses.  Lymph: No lymphadenopathy of posterior or anterior cervical chain or axillae bilaterally.  Gait normal with good balance and coordination.  MSK:  Non tender with full range of motion and good stability and symmetric strength and tone of , elbows, wrist, hip, knee and ankles bilaterally. Severe arthritic changes of multiple joints Shoulder: left Inspection reveals no abnormalities, atrophy or asymmetry. Diffuse tenderness noted. Does have some difficulty with forward flexion lacking the last 5 with range of motion Rotator cuff strength normal throughout. signs of impingement with positive Neer and Hawkin's tests, but negative empty can sign. Speeds and Yergason's tests mild positive. Severely positive crossover test Normal scapular function observed. No painful arc and no drop arm sign. No apprehension sign  MSK US performed of: left This study was ordered, performed, and interpreted by Charlann Boxer D.O.  Shoulder:   Supraspinatus:  Large degenerative rotator cuff tear noted Bursal bulge seen with shoulder abduction on impingement view. Infraspinatus: Degenerative tearing noted Subscapularis:  Appears normal on long and transverse views. Positive bursa AC joint:  Severe arthritic changes with capsulitis Glenohumeral Joint:  Appears normal without effusion. Biceps Tendon:  Degenerative changes with hypoechoic changes within the tendon sheath  Impression: Rotator cuff arthropathy and acromioclavicular arthritis  Procedure: Real-time Ultrasound Guided Injection of left acromial clavicular joint Device: GE Logiq E  Ultrasound guided injection is preferred based studies that show increased duration, increased effect, greater accuracy, decreased procedural pain, increased response rate with ultrasound guided versus blind injection.  Verbal informed consent obtained.  Time-out conducted.  Noted no  overlying erythema, induration, or other signs of local infection.  Skin prepped in a sterile fashion.  Local anesthesia: Topical Ethyl chloride.  With sterile technique and under real time ultrasound guidance:  Joint visualized.  23g 1  inch needle inserted superior approach. Pictures taken for needle placement. Patient did have injection ofe, 2 cc of 0.5% Marcaine, and 1.0 cc of Kenalog 40 mg/dL. Completed without difficulty  Pain immediately resolved suggesting accurate placement of the medication.  Advised to call if fevers/chills, erythema, induration, drainage, or persistent bleeding.  Images permanently stored and available for review in the ultrasound unit.  Impression: Technically successful ultrasound guided injection.    Impression and Recommendations:     This case required medical decision making of moderate complexity.      Note: This dictation was prepared with Dragon dictation along with smaller phrase technology. Any transcriptional errors that result from this process are unintentional.

## 2016-09-24 ENCOUNTER — Ambulatory Visit (INDEPENDENT_AMBULATORY_CARE_PROVIDER_SITE_OTHER): Payer: PPO | Admitting: Family Medicine

## 2016-09-24 ENCOUNTER — Ambulatory Visit: Payer: Self-pay

## 2016-09-24 ENCOUNTER — Encounter: Payer: Self-pay | Admitting: Family Medicine

## 2016-09-24 VITALS — BP 100/68 | HR 88 | Resp 16 | Wt 195.5 lb

## 2016-09-24 DIAGNOSIS — M25512 Pain in left shoulder: Secondary | ICD-10-CM | POA: Diagnosis not present

## 2016-09-24 DIAGNOSIS — M19012 Primary osteoarthritis, left shoulder: Secondary | ICD-10-CM | POA: Insufficient documentation

## 2016-09-24 NOTE — Progress Notes (Signed)
Pre-visit discussion using our clinic review tool. No additional management support is needed unless otherwise documented below in the visit note.  

## 2016-09-24 NOTE — Patient Instructions (Signed)
Good to see you.  Ice 20 minutes 2 times daily. Usually after activity and before bed. Exercises 3 times a week.  pennsaid pinkie amount topically 2 times daily as needed.  Vitamin D 2000 IU daily  Tart cherry extract any dose at night Keep hands within peripheral vision.  Injected ac joint Try some exercises 3 times a week.  See em again in 3-4 weeks.

## 2016-09-24 NOTE — Assessment & Plan Note (Signed)
Patient was given an injection today with some resolution of pain. Patient does have moderate to severe osteophytic changes of the acromial clavicular joint as well as the shoulder itself. Patient does have what appears to be a rotator cuff arthropathy. Discussed with patient a topical medicine, given home exercises and icing protocol. Patient can make these changes and come back and see me again in 4 weeks.

## 2016-10-22 ENCOUNTER — Ambulatory Visit: Payer: PPO | Admitting: Family Medicine

## 2016-10-29 ENCOUNTER — Encounter: Payer: Self-pay | Admitting: Family

## 2016-11-10 ENCOUNTER — Encounter: Payer: Self-pay | Admitting: Family

## 2016-11-10 ENCOUNTER — Ambulatory Visit (INDEPENDENT_AMBULATORY_CARE_PROVIDER_SITE_OTHER): Payer: PPO | Admitting: Family

## 2016-11-10 ENCOUNTER — Ambulatory Visit (HOSPITAL_COMMUNITY)
Admission: RE | Admit: 2016-11-10 | Discharge: 2016-11-10 | Disposition: A | Discharge status: Home / Self Care | Payer: PPO | Source: Ambulatory Visit | Attending: Family | Admitting: Family

## 2016-11-10 VITALS — BP 141/73 | HR 62 | Temp 97.7°F | Resp 20 | Ht 70.0 in | Wt 199.0 lb

## 2016-11-10 DIAGNOSIS — I6523 Occlusion and stenosis of bilateral carotid arteries: Secondary | ICD-10-CM | POA: Diagnosis not present

## 2016-11-10 DIAGNOSIS — Z87891 Personal history of nicotine dependence: Secondary | ICD-10-CM

## 2016-11-10 LAB — VAS US CAROTID
LCCADDIAS: -13 cm/s
LCCADSYS: -89 cm/s
LEFT ECA DIAS: -9 cm/s
LEFT VERTEBRAL DIAS: -13 cm/s
LICADDIAS: -27 cm/s
LICADSYS: -67 cm/s
LICAPDIAS: 15 cm/s
LICAPSYS: 61 cm/s
Left CCA prox dias: 20 cm/s
Left CCA prox sys: 93 cm/s
RCCAPSYS: 84 cm/s
RIGHT CCA MID DIAS: -18 cm/s
RIGHT ECA DIAS: 21 cm/s
Right CCA prox dias: 19 cm/s

## 2016-11-10 NOTE — Progress Notes (Signed)
Chief Complaint: Follow up Extracranial Carotid Artery Stenosis   History of Present Illness  Richard Burgess is a 83 y.o. male patient of Dr. Trula Slade patient is back today for follow-up of his extracranial carotid artery occlusive disease.   The patient denies any history of TIA or stroke symptoms, specifically the patient denies a history of amaurosis fugax or monocular blindness, unilateral facial drooping, hemiplegia, or receptive or expressive aphasia.   He denies claudication symptoms with walking. He stays physically active, tends his large garden and yard.   Pt Diabetic: no Pt smoker: former smoker, quit in the 1960's, smoked for about 20 years, wife smokes outside the house  Pt meds include: Statin : no, he had arthralgias to a statin, he takes Welchol and red yeast rice ASA: yes Other anticoagulants/antiplatelets: no   Past Medical History:  Diagnosis Date  . Allergy   . BPH (benign prostatic hypertrophy)   . Carotid artery occlusion   . Diverticulosis of colon   . ED (erectile dysfunction)   . GERD (gastroesophageal reflux disease)   . Glucose intolerance (impaired glucose tolerance)   . History of pancreatitis   . Hx-TIA (transient ischemic attack)   . Hyperlipidemia   . Hypertension   . Hypertention, malignant, with acute intensive management   . Low back pain   . Osteoarthritis   . PVD (peripheral vascular disease) ([place])    Bilateral carotid  . Tubular adenoma of colon 08/1991   One with carcinoma IN [facility] 1993, multiple adenomatous    Social History Social History  Substance Use Topics  . Smoking status: Former Smoker    Years: 20.00    Types: Cigarettes  . Smokeless tobacco: Never Used     Comment: Daily caffeine use, regular exercise  . Alcohol use 4.2 - 8.4 oz/week    7 - 14 Standard drinks or equivalent per week     Comment: occ beer    Family History Family History  Problem Relation Age of Onset  . Dementia Father   . Diabetes  Mother   . Peripheral vascular disease Brother   . Colon cancer Sister        dx in her 109's    Surgical History Past Surgical History:  Procedure Laterality Date  . APPENDECTOMY    . BASAL CELL CARCINOMA EXCISION    . CHOLECYSTECTOMY    . COLONOSCOPY    . ESOPHAGOGASTRODUODENOSCOPY    . LUMBAR SPINE SURGERY     X4    Allergies  Allergen Reactions  . Amlodipine     Weakness in legs  . Losartan Other (See Comments)    Leg numbness/stiffness.   . Oxycodone     crazy  . Pravastatin Sodium     REACTION: bad dreams  . Rosuvastatin     REACTION: palpitations  . Statins     REACTION: aches    Current Outpatient Prescriptions  Medication Sig Dispense Refill  . aspirin 81 MG tablet Take 81 mg by mouth daily.      . Cholecalciferol (SM VITAMIN D3) 1000 UNITS tablet Take 1,000 Units by mouth daily.      . colesevelam (WELCHOL) 625 MG tablet Take 2 tablets (1,250 mg total) by mouth 2 (two) times daily with a meal. 360 tablet 3  . gabapentin (NEURONTIN) 100 MG capsule Take 1-2 capsules (100-200 mg total) by mouth 3 (three) times daily as needed. 120 capsule 3  . losartan (COZAAR) 100 MG tablet Take 1 tablet (100  mg total) by mouth daily. 90 tablet 1  . Multiple Vitamins-Minerals (VISION-VITE PRESERVE PO) Take 1 tablet by mouth daily.     . pantoprazole (PROTONIX) 40 MG tablet Take 1 tablet (40 mg total) by mouth daily. 90 tablet 3  . Red Yeast Rice 600 MG CAPS Take 1 each by mouth 2 (two) times daily.    . sildenafil (VIAGRA) 100 MG tablet Take 1 tablet (100 mg total) by mouth as needed. 90 tablet 3  . terbinafine (LAMISIL) 250 MG tablet daily.  0  . traMADol (ULTRAM) 50 MG tablet Take 1 tablet (50 mg total) by mouth every 8 (eight) hours as needed. 90 tablet 2  . traZODone (DESYREL) 50 MG tablet Reported on 09/05/2015    . triamcinolone cream (KENALOG) 0.1 % APPLY TO AFFECTED AREA UP TO TWICE A DAY AS NEEDED NOT TO FACE GROIN UNDERARMS  3  . triamcinolone ointment (KENALOG) 0.5 %  Apply 1 application topically 2 (two) times daily. 60 g 1  . valACYclovir (VALTREX) 1000 MG tablet Take 1 tablet (1,000 mg total) by mouth 3 (three) times daily. 21 tablet 0  . zolpidem (AMBIEN) 5 MG tablet TAKE 1 TABLET BY MOUTH AT BEDTIME AS NEEDED FOR SLEEP 30 tablet 3   Current Facility-Administered Medications  Medication Dose Route Frequency Provider Last Rate Last Dose  . methylPREDNISolone acetate (DEPO-MEDROL) injection 80 mg  80 mg Intra-articular Once Plotnikov, Evie Lacks, MD        Review of Systems : See HPI for pertinent positives and negatives.  Physical Examination  Vitals:   11/10/16 1128 11/10/16 1130  BP: (!) 142/76 (!) 141/73  Pulse: 62   Resp: 20   Temp: 97.7 F (36.5 C)   TempSrc: Oral   SpO2: 98%   Weight: 199 lb (90.3 kg)   Height: 5\' 10"  (1.778 m)    Body mass index is 28.55 kg/m.  General: WDWN male in NAD GAIT:normal Eyes: PERRLA Pulmonary: Respirations are non-labored, CTAB, no rales, rhonchi, or wheezing.  Cardiac: regular rhythm, no detected murmur.  VASCULAR EXAM Carotid Bruits Right Left   Negative Positive    Abdominal aortic pulse is not palpable. Radial pulses are 2+ palpable and equal.   LE Pulses Right Left  POPLITEAL not palpable not palpable  POSTERIOR TIBIAL 2+palpable 1+palpable  DORSALIS PEDIS ANTERIOR TIBIAL not palpable 1+ palpable    Gastrointestinal: soft, nontender, BS WNL, no r/g, no palpable masses.  Musculoskeletal: no muscle atrophy/wasting. M/S 5/5 throughout, extremities without ischemic changes. 1+ pitting and non pitting edema in both ankles.   Neurologic: A&O X 3; Appropriate Affect, Speech is normal CN 2-12 intact, pain and light touch intact in extremities, motor exam as listed above     Assessment: Richard Burgess is a 83 y.o. male who has no history of stroke or TIA. Fortunately he does not have DM.  His atherosclerotic risk  factors include remote history of smoking.  He is statin intolerant, he takes a daily 81 mg ASA.    DATA Today's carotid duplex suggests 60 - 79 % right ICA stenosis, and <40% left ICA stenosis. Bilateral vertebral artery flow is antegrade.  Bilateral subclavian artery waveforms are normal.  No significant change compared to the exams of 09/05/15 and 03/10/2016.   Plan: Follow-up in 6 months with Carotid Duplex scan.  I discussed in depth with the patient the nature of atherosclerosis, and emphasized the importance of maximal medical management including strict control of blood pressure, blood glucose,  and lipid levels, obtaining regular exercise, and continued cessation of smoking.  The patient is aware that without maximal medical management the underlying atherosclerotic disease process will progress, limiting the benefit of any interventions. The patient was given information about stroke prevention and what symptoms should prompt the patient to seek immediate medical care. Thank you for allowing [place] to participate in this patient's care.  Clemon Chambers, RN, MSN, FNP-C [facility] Office: [facility] Physician: Trula Slade  11/10/16 11:32 AM

## 2016-11-10 NOTE — Patient Instructions (Signed)
Stroke Prevention Some medical conditions and behaviors are associated with an increased chance of having a stroke. You may prevent a stroke by making healthy choices and managing medical conditions. How can I reduce my risk of having a stroke?  Stay physically active. Get at least 30 minutes of activity on most or all days.  Do not smoke. It may also be helpful to avoid exposure to secondhand smoke.  Limit alcohol use. Moderate alcohol use is considered to be: ? No more than 2 drinks per day for men. ? No more than 1 drink per day for nonpregnant women.  Eat healthy foods. This involves: ? Eating 5 or more servings of fruits and vegetables a day. ? Making dietary changes that address high blood pressure (hypertension), high cholesterol, diabetes, or obesity.  Manage your cholesterol levels. ? Making food choices that are high in fiber and low in saturated fat, trans fat, and cholesterol may control cholesterol levels. ? Take any prescribed medicines to control cholesterol as directed by your health care provider.  Manage your diabetes. ? Controlling your carbohydrate and sugar intake is recommended to manage diabetes. ? Take any prescribed medicines to control diabetes as directed by your health care provider.  Control your hypertension. ? Making food choices that are low in salt (sodium), saturated fat, trans fat, and cholesterol is recommended to manage hypertension. ? Ask your health care provider if you need treatment to lower your blood pressure. Take any prescribed medicines to control hypertension as directed by your health care provider. ? If you are 18-39 years of age, have your blood pressure checked every 3-5 years. If you are 40 years of age or older, have your blood pressure checked every year.  Maintain a healthy weight. ? Reducing calorie intake and making food choices that are low in sodium, saturated fat, trans fat, and cholesterol are recommended to manage  weight.  Stop drug abuse.  Avoid taking birth control pills. ? Talk to your health care provider about the risks of taking birth control pills if you are over 35 years old, smoke, get migraines, or have ever had a blood clot.  Get evaluated for sleep disorders (sleep apnea). ? Talk to your health care provider about getting a sleep evaluation if you snore a lot or have excessive sleepiness.  Take medicines only as directed by your health care provider. ? For some people, aspirin or blood thinners (anticoagulants) are helpful in reducing the risk of forming abnormal blood clots that can lead to stroke. If you have the irregular heart rhythm of atrial fibrillation, you should be on a blood thinner unless there is a good reason you cannot take them. ? Understand all your medicine instructions.  Make sure that other conditions (such as anemia or atherosclerosis) are addressed. Get help right away if:  You have sudden weakness or numbness of the face, arm, or leg, especially on one side of the body.  Your face or eyelid droops to one side.  You have sudden confusion.  You have trouble speaking (aphasia) or understanding.  You have sudden trouble seeing in one or both eyes.  You have sudden trouble walking.  You have dizziness.  You have a loss of balance or coordination.  You have a sudden, severe headache with no known cause.  You have new chest pain or an irregular heartbeat. Any of these symptoms may represent a serious problem that is an emergency. Do not wait to see if the symptoms will go away.   Get medical help at once. Call your local emergency services (911 in [place] Do not drive yourself to the hospital. This information is not intended to replace advice given to you by your health care provider. Make sure you discuss any questions you have with your health care provider. Document Released: 07/03/2004 Document Revised: 11/01/2015 Document Reviewed: 11/26/2012 Elsevier  Interactive Patient Education  2017 Elsevier Inc.     Preventing Cerebrovascular Disease Arteries are blood vessels that carry blood that contains oxygen from the heart to all parts of the body. Cerebrovascular disease affects arteries that supply the brain. Any condition that blocks or disrupts blood flow to the brain can cause cerebrovascular disease. Brain cells that lose blood supply start to die within minutes (stroke). Stroke is the main danger of cerebrovascular disease. Atherosclerosis and high blood pressure are common causes of cerebrovascular disease. Atherosclerosis is narrowing and hardening of an artery that results when fat, cholesterol, calcium, or other substances (plaque) build up inside an artery. Plaque reduces blood flow through the artery. High blood pressure increases the risk of bleeding inside the brain. Making diet and lifestyle changes to prevent atherosclerosis and high blood pressure lowers your risk of cerebrovascular disease. What nutrition changes can be made?  Eat more fruits, vegetables, and whole grains.  Reduce how much saturated fat you eat. To do this, eat less red meat and fewer full-fat dairy products.  Eat healthy proteins instead of red meat. Healthy proteins include: ? Fish. Eat fish that contains heart-healthy omega-3 fatty acids, twice a week. Examples include salmon, albacore tuna, mackerel, and herring. ? Chicken. ? Nuts. ? Low-fat or nonfat yogurt.  Avoid processed meats, like bacon and lunchmeat.  Avoid foods that contain: ? A lot of sugar, such as sweets and drinks with added sugar. ? A lot of salt (sodium). Avoid adding extra salt to your food, as told by your health care provider. ? Trans fats, such as margarine and baked goods. Trans fats may be listed as "partially hydrogenated oils" on food labels.  Check food labels to see how much sodium, sugar, and trans fats are in foods.  Use vegetable oils that contain low amounts of  saturated fat, such as olive oil or canola oil. What lifestyle changes can be made?  Drink alcohol in moderation. This means no more than 1 drink a day for nonpregnant women and 2 drinks a day for men. One drink equals 12 oz of beer, 5 oz of wine, or 1 oz of hard liquor.  If you are overweight, ask your health care provider to recommend a weight-loss plan for you. Losing 5-10 lb (2.2-4.5 kg) can reduce your risk of diabetes, atherosclerosis, and high blood pressure.  Exercise for 30?60 minutes on most days, or as much as told by your health care provider. ? Do moderate-intensity exercise, such as brisk walking, bicycling, and water aerobics. Ask your health care provider which activities are safe for you.  Do not use any products that contain nicotine or tobacco, such as cigarettes and e-cigarettes. If you need help quitting, ask your health care provider. Why are these changes important? Making these changes lowers your risk of many diseases that can cause cerebrovascular disease and stroke. Stroke is a leading cause of death and disability. Making these changes also improves your overall health and quality of life. What can I do to lower my risk? The following factors make you more likely to develop cerebrovascular disease:  Being overweight.  Smoking.  Being physically inactive.    Eating a high-fat diet.  Having certain health conditions, such as: ? Diabetes. ? High blood pressure. ? Heart disease. ? Atherosclerosis. ? High cholesterol. ? Sickle cell disease.  Talk with your health care provider about your risk for cerebrovascular disease. Work with your health care provider to control diseases that you have that may contribute to cerebrovascular disease. Your health care provider may prescribe medicines to help prevent major causes of cerebrovascular disease. Where to find more information: Learn more about preventing cerebrovascular disease from:  National Heart, Lung, and  Blood Institute: www.nhlbi.nih.gov/health/health-topics/topics/stroke  Centers for Disease Control and Prevention: cdc.gov/stroke/about.htm  Summary  Cerebrovascular disease can lead to a stroke.  Atherosclerosis and high blood pressure are major causes of cerebrovascular disease.  Making diet and lifestyle changes can reduce your risk of cerebrovascular disease.  Work with your health care provider to get your risk factors under control to reduce your risk of cerebrovascular disease. This information is not intended to replace advice given to you by your health care provider. Make sure you discuss any questions you have with your health care provider. Document Released: 06/10/2015 Document Revised: 12/14/2015 Document Reviewed: 06/10/2015 Elsevier Interactive Patient Education  2018 Elsevier Inc.  

## 2016-11-18 ENCOUNTER — Encounter: Payer: Self-pay | Admitting: Family Medicine

## 2016-11-18 ENCOUNTER — Ambulatory Visit (INDEPENDENT_AMBULATORY_CARE_PROVIDER_SITE_OTHER): Payer: PPO | Admitting: Family Medicine

## 2016-11-18 DIAGNOSIS — M19012 Primary osteoarthritis, left shoulder: Secondary | ICD-10-CM | POA: Diagnosis not present

## 2016-11-18 NOTE — Patient Instructions (Addendum)
Good to see you  Richard Burgess is your friend.  pennsaid pinkie amount topically 2 times daily as needed.  Keep hands within peripheral vision See me when you need me.

## 2016-11-18 NOTE — Assessment & Plan Note (Signed)
Much better after the injection. Patient is doing very well with conservative therapy. Follow-up as needed.

## 2016-11-18 NOTE — Progress Notes (Signed)
[name] [address] [place], [place] [address] Phone: [phone] Subjective:    I'm seeing this patient by the request  of:  Plotnikov, Evie Lacks, MD   CC: Left shoulder pain f/u  [id number]  Richard Burgess is a 83 y.o. male coming in with complaint of left shoulder pain. Patient was seen previously and was diagnosed with acromioclavicular arthritis. Given injection 09/24/2016. Patient was to do conservative therapy including topical anti-inflammatories and home exercises. Patient prior to this exam 10 weeks ago was given an injection intra-articularly the shoulder by primary care provider. Patient states doing much better at this time. States 90% better. Still some times when patient does move in 2 different planes he has a sharp pain that only last seconds. If he sleeps on that side and still can be uncomfortable. Not stopping him from any daily activities. Working garden on a regular basis.     Past Medical History:  Diagnosis Date  . Allergy   . BPH (benign prostatic hypertrophy)   . Carotid artery occlusion   . Diverticulosis of colon   . ED (erectile dysfunction)   . GERD (gastroesophageal reflux disease)   . Glucose intolerance (impaired glucose tolerance)   . History of pancreatitis   . Hx-TIA (transient ischemic attack)   . Hyperlipidemia   . Hypertension   . Hypertention, malignant, with acute intensive management   . Low back pain   . Osteoarthritis   . PVD (peripheral vascular disease) ([place])    Bilateral carotid  . Tubular adenoma of colon 08/1991   One with carcinoma IN [place] 1993, multiple adenomatous   Past Surgical History:  Procedure Laterality Date  . APPENDECTOMY    . BASAL CELL CARCINOMA EXCISION    . CHOLECYSTECTOMY    . COLONOSCOPY    . ESOPHAGOGASTRODUODENOSCOPY    . LUMBAR SPINE SURGERY     X4   Social History   Social History  . Marital status: Married    Spouse name: N/A  . Number of children: 3  .  Years of education: N/A   Occupational History  . Retired-Construction    Social History Main Topics  . Smoking status: Former Smoker    Years: 20.00    Types: Cigarettes  . Smokeless tobacco: Never Used     Comment: Daily caffeine use, regular exercise  . Alcohol use 4.2 - 8.4 oz/week    7 - 14 Standard drinks or equivalent per week     Comment: occ beer  . Drug use: No  . Sexual activity: Yes   Other Topics Concern  . Not on file   Social History Narrative  . No narrative on file   Allergies  Allergen Reactions  . Amlodipine     Weakness in legs  . Losartan Other (See Comments)    Leg numbness/stiffness.   . Oxycodone     crazy  . Pravastatin Sodium     REACTION: bad dreams  . Rosuvastatin     REACTION: palpitations  . Statins     REACTION: aches   Family History  Problem Relation Age of Onset  . Dementia Father   . Diabetes Mother   . Peripheral vascular disease Brother   . Colon cancer Sister        dx in her 47's    Past medical history, social, surgical and family history all reviewed in electronic medical record.  No pertanent information unless stated regarding to the chief  complaint.   Review of Systems: No headache, visual changes, nausea, vomiting, diarrhea, constipation, dizziness, abdominal pain, skin rash, fevers, chills, night sweats, weight loss, swollen lymph nodes, body aches, joint swelling, muscle aches, chest pain, shortness of breath, mood changes.  Positive muscle aches   Objective  There were no vitals taken for this visit.   Systems examined below as of 11/18/16 General: NAD A&O x3 mood, affect normal  HEENT: Pupils equal, extraocular movements intact no nystagmus Respiratory: not short of breath at rest or with speaking Cardiovascular: No lower extremity edema, non tender Skin: Warm dry intact with no signs of infection or rash on extremities or on axial skeleton. Abdomen: Soft nontender, no masses Neuro: Cranial nerves  intact,  neurovascularly intact in all extremities with 2+ DTRs and 2+ pulses. Lymph: No lymphadenopathy appreciated today  Gait antalgic gait MSK: Non tender with full range of motion and good stability and symmetric strength and tone of , elbows, wrist,  knee hips and ankles bilaterally.   Shoulder: left Inspection reveals no abnormalities, atrophy or asymmetry. Nontender on palpation Does have some difficulty with forward flexion lacking the last 5 with range of motion Rotator cuff strength 4 out of 5 compared to contralateral side signs of impingement with positive Neer and Hawkin's tests, but negative empty can sign. Speeds and Yergason's tests mild positive. Mildly positive crossover Normal scapular function observed. No painful arc and no drop arm sign. No apprehension sign Contralateral shoulder unremarkable    Impression and Recommendations:     This case required medical decision making of moderate complexity.      Note: This dictation was prepared with Dragon dictation along with smaller phrase technology. Any transcriptional errors that result from this process are unintentional.

## 2016-11-27 NOTE — Addendum Note (Signed)
Addended by: Lianne Cure A on: 11/27/2016 12:34 PM   Modules accepted: Orders

## 2016-12-11 ENCOUNTER — Encounter: Payer: Self-pay | Admitting: Internal Medicine

## 2016-12-11 ENCOUNTER — Ambulatory Visit (INDEPENDENT_AMBULATORY_CARE_PROVIDER_SITE_OTHER): Payer: PPO | Admitting: Internal Medicine

## 2016-12-11 VITALS — BP 128/70 | HR 66 | Temp 98.4°F | Wt 200.0 lb

## 2016-12-11 DIAGNOSIS — K219 Gastro-esophageal reflux disease without esophagitis: Secondary | ICD-10-CM | POA: Diagnosis not present

## 2016-12-11 DIAGNOSIS — E785 Hyperlipidemia, unspecified: Secondary | ICD-10-CM

## 2016-12-11 DIAGNOSIS — N320 Bladder-neck obstruction: Secondary | ICD-10-CM

## 2016-12-11 DIAGNOSIS — L570 Actinic keratosis: Secondary | ICD-10-CM

## 2016-12-11 DIAGNOSIS — R079 Chest pain, unspecified: Secondary | ICD-10-CM

## 2016-12-11 MED ORDER — NITROGLYCERIN 0.4 MG SL SUBL: 0.4000 mg | SUBLINGUAL_TABLET | SUBLINGUAL | 1 refills | Status: AC | PRN

## 2016-12-11 NOTE — Patient Instructions (Signed)
   Postprocedure instructions :     Keep the wounds clean. You can wash them with liquid soap and water. Pat dry with gauze or a Kleenex tissue  Before applying antibiotic ointment and a Band-Aid.   You need to report immediately  if  any signs of infection develop.    

## 2016-12-11 NOTE — Assessment & Plan Note (Signed)
See procedure 

## 2016-12-11 NOTE — Progress Notes (Signed)
Subjective:  Patient ID: Richard Burgess, male    DOB: January 12, 1929  Age: 83 y.o. MRN: [id number]  CC: No chief complaint on file.   HPI Richard Burgess presents for HTN, CAD, dyslipidemia f/u C/o GERD sx's w/poss CP - acid taste - two episodes after he ate cucumbers w/vinegar; last on Tue. TUMS helped. No LOC, DOE, sweating noted  Outpatient Medications Prior to Visit  Medication Sig Dispense Refill  . aspirin 81 MG tablet Take 81 mg by mouth daily.      . Cholecalciferol (SM VITAMIN D3) 1000 UNITS tablet Take 1,000 Units by mouth daily.      . colesevelam (WELCHOL) 625 MG tablet Take 2 tablets (1,250 mg total) by mouth 2 (two) times daily with a meal. 360 tablet 3  . gabapentin (NEURONTIN) 100 MG capsule Take 1-2 capsules (100-200 mg total) by mouth 3 (three) times daily as needed. 120 capsule 3  . losartan (COZAAR) 100 MG tablet Take 1 tablet (100 mg total) by mouth daily. 90 tablet 1  . Multiple Vitamins-Minerals (VISION-VITE PRESERVE PO) Take 1 tablet by mouth daily.     . pantoprazole (PROTONIX) 40 MG tablet Take 1 tablet (40 mg total) by mouth daily. 90 tablet 3  . Red Yeast Rice 600 MG CAPS Take 1 each by mouth 2 (two) times daily.    . sildenafil (VIAGRA) 100 MG tablet Take 1 tablet (100 mg total) by mouth as needed. 90 tablet 3  . terbinafine (LAMISIL) 250 MG tablet daily.  0  . traMADol (ULTRAM) 50 MG tablet Take 1 tablet (50 mg total) by mouth every 8 (eight) hours as needed. 90 tablet 2  . traZODone (DESYREL) 50 MG tablet Reported on 09/05/2015    . triamcinolone cream (KENALOG) 0.1 % APPLY TO AFFECTED AREA UP TO TWICE A DAY AS NEEDED NOT TO FACE GROIN UNDERARMS  3  . triamcinolone ointment (KENALOG) 0.5 % Apply 1 application topically 2 (two) times daily. 60 g 1  . valACYclovir (VALTREX) 1000 MG tablet Take 1 tablet (1,000 mg total) by mouth 3 (three) times daily. 21 tablet 0  . zolpidem (AMBIEN) 5 MG tablet TAKE 1 TABLET BY MOUTH AT BEDTIME AS NEEDED FOR SLEEP 30 tablet 3    Facility-Administered Medications Prior to Visit  Medication Dose Route Frequency Provider Last Rate Last Dose  . methylPREDNISolone acetate (DEPO-MEDROL) injection 80 mg  80 mg Intra-articular Once Plotnikov, Evie Lacks, MD        ROS Review of Systems  Constitutional: Negative for appetite change, fatigue and unexpected weight change.  HENT: Negative for congestion, nosebleeds, sneezing, sore throat and trouble swallowing.   Eyes: Negative for itching and visual disturbance.  Respiratory: Negative for cough.   Cardiovascular: Negative for chest pain, palpitations and leg swelling.  Gastrointestinal: Negative for abdominal distention, blood in stool, diarrhea and nausea.  Genitourinary: Negative for frequency and hematuria.  Musculoskeletal: Negative for back pain, gait problem, joint swelling and neck pain.  Skin: Negative for rash.  Neurological: Negative for dizziness, tremors, speech difficulty and weakness.  Psychiatric/Behavioral: Negative for agitation, dysphoric mood and sleep disturbance. The patient is not nervous/anxious.     Objective:  BP 128/70   Pulse 66   Temp 98.4 F (36.9 C)   Wt 200 lb (90.7 kg)   SpO2 98%   BMI 28.70 kg/m   BP Readings from Last 3 Encounters:  12/11/16 128/70  11/18/16 136/72  11/10/16 (!) 141/73    Wt Readings from  Last 3 Encounters:  12/11/16 200 lb (90.7 kg)  11/18/16 199 lb (90.3 kg)  11/10/16 199 lb (90.3 kg)    Physical Exam  Constitutional: He is oriented to person, place, and time. He appears well-developed. No distress.  NAD  HENT:  Mouth/Throat: Oropharynx is clear and moist.  Eyes: Conjunctivae are normal. Pupils are equal, round, and reactive to light.  Neck: Normal range of motion. No JVD present. No thyromegaly present.  Cardiovascular: Normal rate, regular rhythm, normal heart sounds and intact distal pulses.  Exam reveals no gallop and no friction rub.   No murmur heard. Pulmonary/Chest: Effort normal and  breath sounds normal. No respiratory distress. He has no wheezes. He has no rales. He exhibits no tenderness.  Abdominal: Soft. Bowel sounds are normal. He exhibits no distension and no mass. There is no tenderness. There is no rebound and no guarding.  Musculoskeletal: Normal range of motion. He exhibits no edema or tenderness.  Lymphadenopathy:    He has no cervical adenopathy.  Neurological: He is alert and oriented to person, place, and time. He has normal reflexes. No cranial nerve deficit. He exhibits normal muscle tone. He displays a negative Romberg sign. Coordination and gait normal.  Skin: Skin is warm and dry. No rash noted.  Psychiatric: He has a normal mood and affect. His behavior is normal. Judgment and thought content normal.  obese R ear lesion, face  Procedure: EKG Indication: chest pain Impression: NSR. No PACs. No acute changes.   Procedure Note :     Procedure : Cryosurgery   Indication:  Actinic keratosis(es)   Risks including unsuccessful procedure , bleeding, infection, bruising, scar, a need for a repeat  procedure and others were explained to the patient in detail as well as the benefits. Informed consent was obtained verbally.   1  lesion(s)  On R ear and 2 on face    was/were treated with liquid nitrogen on a Q-tip in a usual fasion . Band-Aid was applied and antibiotic ointment was given for a later use.   Tolerated well. Complications none.   Postprocedure instructions :     Keep the wounds clean. You can wash them with liquid soap and water. Pat dry with gauze or a Kleenex tissue  Before applying antibiotic ointment and a Band-Aid.   You need to report immediately  if  any signs of infection develop.     Lab Results  Component Value Date   WBC 7.7 10/30/2014   HGB 14.8 10/30/2014   HCT 43.3 10/30/2014   PLT 198.0 10/30/2014   GLUCOSE 118 (H) 02/18/2016   CHOL 220 (H) 02/18/2016   TRIG 233.0 (H) 02/18/2016   HDL 37.50 (L) 02/18/2016    LDLDIRECT 153.0 02/18/2016   LDLCALC 91 04/22/2010   ALT 15 02/18/2016   AST 15 02/18/2016   NA 136 02/18/2016   K 4.9 02/18/2016   CL 103 02/18/2016   CREATININE 0.94 02/18/2016   BUN 19 02/18/2016   CO2 28 02/18/2016   TSH 2.88 02/18/2016   PSA 0.28 03/23/2014   HGBA1C 6.5 08/01/2013   MICROALBUR 0.2 12/13/2007    No results found.  Assessment & Plan:   There are no diagnoses linked to this encounter. I am having Mr. Groleau maintain his aspirin, Cholecalciferol, colesevelam, sildenafil, Red Yeast Rice, triamcinolone ointment, Multiple Vitamins-Minerals (VISION-VITE PRESERVE PO), zolpidem, traZODone, terbinafine, triamcinolone cream, pantoprazole, losartan, valACYclovir, traMADol, and gabapentin. We will continue to administer methylPREDNISolone acetate.  No orders of the  defined types were placed in this encounter.    Follow-up: No Follow-up on file.  [name], MD

## 2016-12-11 NOTE — Assessment & Plan Note (Signed)
Worse On Pantoprazole TUMS

## 2016-12-11 NOTE — Assessment & Plan Note (Signed)
GERD related? On Pantoprazole TUMS EKG CL stress test

## 2017-01-06 ENCOUNTER — Telehealth: Payer: Self-pay | Admitting: Internal Medicine

## 2017-01-06 DIAGNOSIS — R0789 Other chest pain: Secondary | ICD-10-CM

## 2017-01-06 NOTE — Telephone Encounter (Signed)
[facility] [employer] is not approving pt's stress test because they are wanting him to do a treadmill test. They are needing a good reason as to why he is unable to do the treadmill. Please advise

## 2017-01-07 ENCOUNTER — Other Ambulatory Visit: Payer: Self-pay

## 2017-01-07 DIAGNOSIS — R079 Chest pain, unspecified: Secondary | ICD-10-CM

## 2017-01-07 NOTE — Telephone Encounter (Signed)
OK treadmill test Thx

## 2017-01-15 NOTE — Telephone Encounter (Signed)
I'll ref the pt to Cardiology Thx

## 2017-01-15 NOTE — Telephone Encounter (Signed)
Received another call from [facility] [employer] and they still won't approve the stress test because there is no prior history of heart disease, diabetes, hypertension, no abnormal Echo or EKG.She suggested maybe doing an echo first. Please advise.

## 2017-01-24 DIAGNOSIS — R079 Chest pain, unspecified: Secondary | ICD-10-CM | POA: Diagnosis not present

## 2017-01-25 ENCOUNTER — Encounter (HOSPITAL_COMMUNITY): Payer: Self-pay | Admitting: *Deleted

## 2017-01-25 ENCOUNTER — Emergency Department (HOSPITAL_COMMUNITY): Payer: PPO

## 2017-01-25 ENCOUNTER — Inpatient Hospital Stay (HOSPITAL_COMMUNITY)
Admission: EM | Admit: 2017-01-25 | Discharge: 2017-01-27 | DRG: 282 | Disposition: A | Discharge status: Home / Self Care | Payer: PPO | Attending: Cardiovascular Disease | Admitting: Cardiovascular Disease

## 2017-01-25 DIAGNOSIS — Z79899 Other long term (current) drug therapy: Secondary | ICD-10-CM | POA: Diagnosis not present

## 2017-01-25 DIAGNOSIS — Z8249 Family history of ischemic heart disease and other diseases of the circulatory system: Secondary | ICD-10-CM | POA: Diagnosis not present

## 2017-01-25 DIAGNOSIS — I10 Essential (primary) hypertension: Secondary | ICD-10-CM | POA: Diagnosis present

## 2017-01-25 DIAGNOSIS — Z7982 Long term (current) use of aspirin: Secondary | ICD-10-CM | POA: Diagnosis not present

## 2017-01-25 DIAGNOSIS — Z85828 Personal history of other malignant neoplasm of skin: Secondary | ICD-10-CM

## 2017-01-25 DIAGNOSIS — I959 Hypotension, unspecified: Secondary | ICD-10-CM | POA: Diagnosis not present

## 2017-01-25 DIAGNOSIS — Z885 Allergy status to narcotic agent status: Secondary | ICD-10-CM | POA: Diagnosis not present

## 2017-01-25 DIAGNOSIS — Z9049 Acquired absence of other specified parts of digestive tract: Secondary | ICD-10-CM | POA: Diagnosis not present

## 2017-01-25 DIAGNOSIS — M791 Myalgia: Secondary | ICD-10-CM | POA: Diagnosis not present

## 2017-01-25 DIAGNOSIS — E7800 Pure hypercholesterolemia, unspecified: Secondary | ICD-10-CM | POA: Diagnosis not present

## 2017-01-25 DIAGNOSIS — R1310 Dysphagia, unspecified: Secondary | ICD-10-CM | POA: Diagnosis not present

## 2017-01-25 DIAGNOSIS — R0789 Other chest pain: Secondary | ICD-10-CM

## 2017-01-25 DIAGNOSIS — I6529 Occlusion and stenosis of unspecified carotid artery: Secondary | ICD-10-CM | POA: Diagnosis present

## 2017-01-25 DIAGNOSIS — Z888 Allergy status to other drugs, medicaments and biological substances status: Secondary | ICD-10-CM

## 2017-01-25 DIAGNOSIS — I200 Unstable angina: Secondary | ICD-10-CM | POA: Diagnosis present

## 2017-01-25 DIAGNOSIS — E785 Hyperlipidemia, unspecified: Secondary | ICD-10-CM | POA: Diagnosis present

## 2017-01-25 DIAGNOSIS — I25110 Atherosclerotic heart disease of native coronary artery with unstable angina pectoris: Secondary | ICD-10-CM | POA: Diagnosis not present

## 2017-01-25 DIAGNOSIS — R079 Chest pain, unspecified: Secondary | ICD-10-CM | POA: Diagnosis not present

## 2017-01-25 DIAGNOSIS — K219 Gastro-esophageal reflux disease without esophagitis: Secondary | ICD-10-CM | POA: Diagnosis not present

## 2017-01-25 DIAGNOSIS — I214 Non-ST elevation (NSTEMI) myocardial infarction: Secondary | ICD-10-CM

## 2017-01-25 DIAGNOSIS — Z79891 Long term (current) use of opiate analgesic: Secondary | ICD-10-CM

## 2017-01-25 DIAGNOSIS — N400 Enlarged prostate without lower urinary tract symptoms: Secondary | ICD-10-CM | POA: Diagnosis present

## 2017-01-25 DIAGNOSIS — I2510 Atherosclerotic heart disease of native coronary artery without angina pectoris: Secondary | ICD-10-CM | POA: Diagnosis not present

## 2017-01-25 DIAGNOSIS — Z87891 Personal history of nicotine dependence: Secondary | ICD-10-CM

## 2017-01-25 DIAGNOSIS — I6523 Occlusion and stenosis of bilateral carotid arteries: Secondary | ICD-10-CM | POA: Diagnosis not present

## 2017-01-25 DIAGNOSIS — I739 Peripheral vascular disease, unspecified: Secondary | ICD-10-CM | POA: Diagnosis not present

## 2017-01-25 LAB — CBC
HCT: 35.8 % — ABNORMAL LOW (ref 39.0–52.0)
HEMATOCRIT: 37.7 % — AB (ref 39.0–52.0)
HEMOGLOBIN: 13.1 g/dL (ref 13.0–17.0)
Hemoglobin: 12.5 g/dL — ABNORMAL LOW (ref 13.0–17.0)
MCH: 30.1 pg (ref 26.0–34.0)
MCH: 30.3 pg (ref 26.0–34.0)
MCHC: 34.7 g/dL (ref 30.0–36.0)
MCHC: 34.9 g/dL (ref 30.0–36.0)
MCV: 86.7 fL (ref 78.0–100.0)
MCV: 86.7 fL (ref 78.0–100.0)
PLATELETS: 176 10*3/uL (ref 150–400)
Platelets: 190 10*3/uL (ref 150–400)
RBC: 4.13 MIL/uL — ABNORMAL LOW (ref 4.22–5.81)
RBC: 4.35 MIL/uL (ref 4.22–5.81)
RDW: 12.9 % (ref 11.5–15.5)
RDW: 13.1 % (ref 11.5–15.5)
WBC: 7 10*3/uL (ref 4.0–10.5)
WBC: 7.9 10*3/uL (ref 4.0–10.5)

## 2017-01-25 LAB — BASIC METABOLIC PANEL
ANION GAP: 9 (ref 5–15)
Anion gap: 8 (ref 5–15)
BUN: 14 mg/dL (ref 6–20)
BUN: 15 mg/dL (ref 6–20)
CALCIUM: 9.3 mg/dL (ref 8.9–10.3)
CO2: 21 mmol/L — AB (ref 22–32)
CO2: 23 mmol/L (ref 22–32)
Calcium: 9.5 mg/dL (ref 8.9–10.3)
Chloride: 108 mmol/L (ref 101–111)
Chloride: 108 mmol/L (ref 101–111)
Creatinine, Ser: 1.25 mg/dL — ABNORMAL HIGH (ref 0.61–1.24)
Creatinine, Ser: 1.22 mg/dL (ref 0.61–1.24)
GFR calc Af Amer: 58 mL/min — ABNORMAL LOW (ref >60)
GFR calc Af Amer: >60 mL/min (ref >60)
GFR calc non Af Amer: 50 mL/min — ABNORMAL LOW (ref >60)
GFR, EST NON AFRICAN AMERICAN: 52 mL/min — AB (ref >60)
GLUCOSE: 136 mg/dL — AB (ref 65–99)
GLUCOSE: 145 mg/dL — AB (ref 65–99)
POTASSIUM: 4.2 mmol/L (ref 3.5–5.1)
Potassium: 4.1 mmol/L (ref 3.5–5.1)
Sodium: 138 mmol/L (ref 135–145)
Sodium: 139 mmol/L (ref 135–145)

## 2017-01-25 LAB — CK: Total CK: 133 U/L (ref 49–397)

## 2017-01-25 LAB — I-STAT TROPONIN, ED: Troponin i, poc: 0.08 ng/mL (ref 0.00–0.08)

## 2017-01-25 LAB — HEMOGLOBIN A1C
Hgb A1c MFr Bld: 5.9 % — ABNORMAL HIGH (ref 4.8–5.6)
Mean Plasma Glucose: 122.63 mg/dL

## 2017-01-25 LAB — PROTIME-INR
INR: 1.01
PROTHROMBIN TIME: 13.3 s (ref 11.4–15.2)

## 2017-01-25 LAB — TROPONIN I
TROPONIN I: 2.69 ng/mL — AB (ref <0.03)
TROPONIN I: 7.77 ng/mL — AB (ref <0.03)
Troponin I: 0.45 ng/mL (ref <0.03)

## 2017-01-25 LAB — MRSA PCR SCREENING: MRSA BY PCR: NEGATIVE

## 2017-01-25 LAB — HEPARIN LEVEL (UNFRACTIONATED): Heparin Unfractionated: 0.2 IU/mL — ABNORMAL LOW (ref 0.30–0.70)

## 2017-01-25 LAB — GLUCOSE, CAPILLARY: Glucose-Capillary: 128 mg/dL — ABNORMAL HIGH (ref 65–99)

## 2017-01-25 MED ORDER — ZOLPIDEM TARTRATE 5 MG PO TABS
5.0000 mg | ORAL_TABLET | Freq: Every evening | ORAL | Status: DC | PRN
  Filled 2017-01-25: qty 1

## 2017-01-25 MED ORDER — TRAMADOL HCL 50 MG PO TABS
50.0000 mg | ORAL_TABLET | Freq: Four times a day (QID) | ORAL | Status: DC | PRN
  Administered 2017-01-25: 50 mg via ORAL
  Filled 2017-01-25: qty 1

## 2017-01-25 MED ORDER — SODIUM CHLORIDE 0.9 % WEIGHT BASED INFUSION
3.0000 mL/kg/h | INTRAVENOUS | Status: DC
  Administered 2017-01-26: 3 mL/kg/h via INTRAVENOUS

## 2017-01-25 MED ORDER — TRAZODONE HCL 50 MG PO TABS
50.0000 mg | ORAL_TABLET | Freq: Every day | ORAL | Status: DC
  Administered 2017-01-25: 50 mg via ORAL
  Filled 2017-01-25: qty 1

## 2017-01-25 MED ORDER — ONDANSETRON HCL 4 MG/2ML IJ SOLN
4.0000 mg | Freq: Four times a day (QID) | INTRAMUSCULAR | Status: DC | PRN
  Administered 2017-01-25: 4 mg via INTRAVENOUS
  Filled 2017-01-25: qty 2

## 2017-01-25 MED ORDER — ALUM & MAG HYDROXIDE-SIMETH 200-200-20 MG/5ML PO SUSP
30.0000 mL | ORAL | Status: DC | PRN
  Administered 2017-01-25: 30 mL via ORAL
  Filled 2017-01-25: qty 30

## 2017-01-25 MED ORDER — LOSARTAN POTASSIUM 50 MG PO TABS
50.0000 mg | ORAL_TABLET | Freq: Every day | ORAL | Status: DC
  Administered 2017-01-26: 50 mg via ORAL
  Filled 2017-01-25: qty 1

## 2017-01-25 MED ORDER — SODIUM CHLORIDE 0.9% FLUSH: 3.0000 mL | INTRAVENOUS | Status: DC | PRN

## 2017-01-25 MED ORDER — ACETAMINOPHEN 325 MG PO TABS: 650.0000 mg | ORAL_TABLET | ORAL | Status: DC | PRN

## 2017-01-25 MED ORDER — SODIUM CHLORIDE 0.9 % IV SOLN: 250.0000 mL | INTRAVENOUS | Status: DC | PRN

## 2017-01-25 MED ORDER — GABAPENTIN 100 MG PO CAPS
100.0000 mg | ORAL_CAPSULE | Freq: Three times a day (TID) | ORAL | Status: DC
  Administered 2017-01-25 (×3): 100 mg via ORAL
  Filled 2017-01-25 (×3): qty 1

## 2017-01-25 MED ORDER — PANTOPRAZOLE SODIUM 40 MG PO TBEC
40.0000 mg | DELAYED_RELEASE_TABLET | Freq: Every day | ORAL | Status: DC
  Administered 2017-01-25: 40 mg via ORAL
  Filled 2017-01-25: qty 1

## 2017-01-25 MED ORDER — ASPIRIN 81 MG PO CHEW: 324.0000 mg | CHEWABLE_TABLET | Freq: Once | ORAL | Status: DC

## 2017-01-25 MED ORDER — ONDANSETRON 4 MG PO TBDP
ORAL_TABLET | ORAL | Status: AC
  Filled 2017-01-25: qty 1

## 2017-01-25 MED ORDER — ORAL CARE MOUTH RINSE
15.0000 mL | Freq: Two times a day (BID) | OROMUCOSAL | Status: DC
  Administered 2017-01-25 – 2017-01-27 (×3): 15 mL via OROMUCOSAL

## 2017-01-25 MED ORDER — COLESEVELAM HCL 625 MG PO TABS
1250.0000 mg | ORAL_TABLET | Freq: Two times a day (BID) | ORAL | Status: DC
  Administered 2017-01-25: 1250 mg via ORAL
  Filled 2017-01-25 (×3): qty 2

## 2017-01-25 MED ORDER — LORAZEPAM 2 MG/ML IJ SOLN
INTRAMUSCULAR | Status: AC
  Filled 2017-01-25: qty 1

## 2017-01-25 MED ORDER — SODIUM CHLORIDE 0.9 % WEIGHT BASED INFUSION: 1.0000 mL/kg/h | INTRAVENOUS | Status: DC

## 2017-01-25 MED ORDER — HEPARIN (PORCINE) IN NACL 100-0.45 UNIT/ML-% IJ SOLN
1250.0000 [IU]/h | INTRAMUSCULAR | Status: DC
  Administered 2017-01-25: 1250 [IU]/h via INTRAVENOUS
  Administered 2017-01-25: 1000 [IU]/h via INTRAVENOUS
  Filled 2017-01-25 (×2): qty 250

## 2017-01-25 MED ORDER — NITROGLYCERIN 0.4 MG SL SUBL
0.4000 mg | SUBLINGUAL_TABLET | SUBLINGUAL | Status: DC | PRN
  Administered 2017-01-25: 0.4 mg via SUBLINGUAL
  Filled 2017-01-25: qty 1

## 2017-01-25 MED ORDER — ASPIRIN EC 81 MG PO TBEC
81.0000 mg | DELAYED_RELEASE_TABLET | Freq: Every day | ORAL | Status: DC
  Administered 2017-01-25 – 2017-01-27 (×3): 81 mg via ORAL
  Filled 2017-01-25 (×3): qty 1

## 2017-01-25 MED ORDER — SODIUM CHLORIDE 0.9% FLUSH
3.0000 mL | Freq: Two times a day (BID) | INTRAVENOUS | Status: DC
  Administered 2017-01-25 – 2017-01-26 (×2): 3 mL via INTRAVENOUS

## 2017-01-25 MED ORDER — VALACYCLOVIR HCL 500 MG PO TABS: 1000.0000 mg | ORAL_TABLET | Freq: Three times a day (TID) | ORAL | Status: DC

## 2017-01-25 MED ORDER — LOSARTAN POTASSIUM 50 MG PO TABS: 100.0000 mg | ORAL_TABLET | Freq: Every day | ORAL | Status: DC

## 2017-01-25 MED ORDER — NITROGLYCERIN 0.4 MG SL SUBL: 0.4000 mg | SUBLINGUAL_TABLET | SUBLINGUAL | Status: DC | PRN

## 2017-01-25 MED ORDER — METOPROLOL TARTRATE 12.5 MG HALF TABLET
12.5000 mg | ORAL_TABLET | Freq: Two times a day (BID) | ORAL | Status: DC
  Administered 2017-01-25 – 2017-01-27 (×3): 12.5 mg via ORAL
  Filled 2017-01-25 (×3): qty 1

## 2017-01-25 MED ORDER — LORAZEPAM 2 MG/ML IJ SOLN
0.5000 mg | Freq: Once | INTRAMUSCULAR | Status: AC
  Administered 2017-01-25: 0.5 mg via INTRAVENOUS

## 2017-01-25 MED ORDER — HEPARIN BOLUS VIA INFUSION
4000.0000 [IU] | Freq: Once | INTRAVENOUS | Status: AC
  Administered 2017-01-25: 4000 [IU] via INTRAVENOUS
  Filled 2017-01-25: qty 4000

## 2017-01-25 NOTE — ED Triage Notes (Signed)
Pt to ED by EMS c/o chest pain, denies radiation to back or either arms. Pt laid down after watching the race tonight and started having chest discomfort similar to indigestion. Pt took antiacids and nitro with some relief. EMS gave nitro x2 and 324mg  ASA enroute. Pt reports he has been working outside for the past three days and has felt generally fatigued

## 2017-01-25 NOTE — Progress Notes (Signed)
[facility] for Heparin  Indication: chest pain/ACS   Vital Signs: BP: 101/70 (08/19 1715) Pulse Rate: 62 (08/19 1715)  Heparin dosing weight: 89.4 kg  Labs:  Recent Labs  01/25/17 0037 01/25/17 0430 01/25/17 1052 01/25/17 1642  HGB 13.1 12.5*  --   --   HCT 37.7* 35.8*  --   --   PLT 190 176  --   --   LABPROT  --  13.3  --   --   INR  --  1.01  --   --   HEPARINUNFRC  --   --   --  0.20*  CREATININE 1.25* 1.22  --   --   CKTOTAL 133  --   --   --   TROPONINI  --  0.45* 2.69*  --     CrCl cannot be calculated (Unknown ideal weight.).   Medical History: Past Medical History:  Diagnosis Date  . Allergy   . BPH (benign prostatic hypertrophy)   . Carotid artery occlusion   . Diverticulosis of colon   . ED (erectile dysfunction)   . GERD (gastroesophageal reflux disease)   . Glucose intolerance (impaired glucose tolerance)   . History of pancreatitis   . Hx-TIA (transient ischemic attack)   . Hyperlipidemia   . Hypertension   . Hypertention, malignant, with acute intensive management   . Low back pain   . Osteoarthritis   . PVD (peripheral vascular disease) ([place])    Bilateral carotid  . Tubular adenoma of colon 08/1991   One with carcinoma IN [place] 1993, multiple adenomatous   Assessment: Heparin gtt for rule-out ACS With chest pain Evening heparin level is SUBtherapetuic  Will increase rate CBC wnl   Goal of Therapy:  Heparin level 0.3-0.7 units/ml Monitor platelets by anticoagulation protocol: Yes    Plan:  -Increase heparin to 1250 units/hr -Daily HL, CBC    Hughes Better, PharmD, BCPS Clinical Pharmacist 01/25/2017 5:24 PM

## 2017-01-25 NOTE — Progress Notes (Signed)
CRITICAL VALUE ALERT  Critical Value:  Trop 7.77  Date & Time Notied:  01/25/2017; 1650  Provider Notified: Wynonia Lawman  Orders Received/Actions taken: cont to monitor pt

## 2017-01-25 NOTE — ED Notes (Signed)
Called to room after receiving nitro x 1 c/o increased SOB and tachypneic. Pt hyperventilating, nearly in tears. Spoke with Cardiolgist, Dr.Fudim, who gave verbal order for ativan.

## 2017-01-25 NOTE — ED Notes (Signed)
Cardiology provider at bedside

## 2017-01-25 NOTE — ED Notes (Signed)
Cardiologist at bedside.  

## 2017-01-25 NOTE — Progress Notes (Signed)
Progress Note  Patient Name: Richard Burgess Date of Encounter: 01/25/2017  Primary Cardiologist: New   Subjective   Complaints of trouble swallowing. No teeth and has trouble with food sticking. Continues complaints of substernal chest pressure that has not gone away since admission.   Inpatient Medications    Scheduled Meds: . aspirin EC  81 mg Oral Daily  . colesevelam  1,250 mg Oral BID WC  . gabapentin  100 mg Oral TID  . losartan  50 mg Oral Daily  . metoprolol tartrate  12.5 mg Oral BID  . pantoprazole  40 mg Oral Daily  . traZODone  50 mg Oral QHS   Continuous Infusions: . heparin 1,000 Units/hr (01/25/17 0439)   PRN Meds: acetaminophen, alum & mag hydroxide-simeth, nitroGLYCERIN, nitroGLYCERIN, ondansetron (ZOFRAN) IV, traMADol, zolpidem   Vital Signs    Vitals:   01/25/17 0800 01/25/17 0830 01/25/17 0900 01/25/17 0920  BP: 110/66 98/66 (!) 99/56 115/72  Pulse: (!) 59 (!) 58 (!) 55 66  Resp: 19 15 13 18   Temp:      TempSrc:      SpO2: 90% 95% (!) 87% 97%   No intake or output data in the 24 hours ending 01/25/17 0955 There were no vitals filed for this visit.  Telemetry    NSR  Personally Reviewe  Physical Exam   GEN: No acute distress. Complaining of epigastric pain.  Neck: No JVD Cardiac: RRR, 1/6 systolic murmur,  rubs, or gallops.  Respiratory: Clear to auscultation bilaterally. GI: Soft, tenderness with palpation of epigastric area, non-distended  MS: No edema; No deformity. Neuro:  Nonfocal  Psych: Normal affect   Labs    Chemistry  Recent Labs Lab 01/25/17 0037 01/25/17 0430  NA 138 139  K 4.2 4.1  CL 108 108  CO2 21* 23  GLUCOSE 145* 136*  BUN 15 14  CREATININE 1.25* 1.22  CALCIUM 9.5 9.3  GFRNONAA 50* 52*  GFRAA 58* >60  ANIONGAP 9 8     Hematology  Recent Labs Lab 01/25/17 0037 01/25/17 0430  WBC 7.0 7.9  RBC 4.35 4.13*  HGB 13.1 12.5*  HCT 37.7* 35.8*  MCV 86.7 86.7  MCH 30.1 30.3  MCHC 34.7 34.9  RDW  12.9 13.1  PLT 190 176    Cardiac Enzymes  Recent Labs Lab 01/25/17 0430  TROPONINI 0.45*     Recent Labs Lab 01/25/17 0050  TROPIPOC 0.08     Radiology    Dg Chest 2 View  Result Date: 01/25/2017 CLINICAL DATA:  Non radiating chest pain while watching race tonight. Fatigue after working outside. EXAM: CHEST  2 VIEW COMPARISON:  Chest radiograph January 21, 2012 FINDINGS: Cardiomediastinal silhouette is normal. No pleural effusions or focal consolidations. Trachea projects midline and there is no pneumothorax. Soft tissue planes and included osseous structures are non-suspicious. Surgical clips in the included right abdomen compatible with cholecystectomy. Mild degenerative change of the thoracic spine. IMPRESSION: No acute cardiopulmonary process. Electronically Signed   By: Elon Alas M.D.   On: 01/25/2017 01:05    Cardiac Studies   None  Patient Profile     83 y.o. male  PMH of HTN, PAD. Presents with acute onset of CP. Coming from home. Under tremendous stress with family issues. Does not want to share why. Pain is substernal. Lasting for hours now. Nitro in the ambulance improved it. Denies PND, orthopnea, LEE or SOB. No missed meds.   Assessment & Plan  1. Chest Pain: Patient continues to have some epigastric discomfort, but not severe. Had severe pressure, non-radiating without associated dyspnea or diaphoresis. Some help with NTG at home. Troponin 0.08; 0.45 respectively. Plan is to consider cardiac cath for evaluation of coronary anatomy and CAD as etiology of discomfort. CVRF include age, HTN, carotid artery disease, hyperlipidemia. Continues on heparin gtt.   2. Hypertension: Soft this am. On losartan 100 mg daily. Will decrease this to 50 mg. Now on metoprolol 12.5 mg BID as well. He has received pain control but has not been given any more due to hypotension.    3.  Hx of GERD and Dysphagia: Continues on PPI. Reports that he is to be seen by GI for possible  EDG due to these symptoms. Defer to PCP team to arrange.   Signed, Phill Myron. West Pugh, ANP, [place]    01/25/2017, 9:55 AM    Attending note:  Patient admitted by cardiology fellow over night. Assessed this morning by Ms. Lawrence NP and pain free in ER, awaits bed. Troponin I up to 0.45. ECG without acute ST changes. Plan is for cardiac catheterization tomorrow. Continue Heparin infusion.  Satira Sark, M.D., F.A.C.C.

## 2017-01-25 NOTE — H&P (Signed)
History & Physical    Patient ID: Richard Burgess MRN: [id number], DOB/AGE: 81/02/1929   Admit date: 01/25/2017   Primary Physician: Cassandria Anger, MD Primary Cardiologist: None  Patient Profile    Unstable Angina  Past Medical History    Past Medical History:  Diagnosis Date  . Allergy   . BPH (benign prostatic hypertrophy)   . Carotid artery occlusion   . Diverticulosis of colon   . ED (erectile dysfunction)   . GERD (gastroesophageal reflux disease)   . Glucose intolerance (impaired glucose tolerance)   . History of pancreatitis   . Hx-TIA (transient ischemic attack)   . Hyperlipidemia   . Hypertension   . Hypertention, malignant, with acute intensive management   . Low back pain   . Osteoarthritis   . PVD (peripheral vascular disease) ([place])    Bilateral carotid  . Tubular adenoma of colon 08/1991   One with carcinoma IN [place] 1993, multiple adenomatous    Past Surgical History:  Procedure Laterality Date  . APPENDECTOMY    . BASAL CELL CARCINOMA EXCISION    . CHOLECYSTECTOMY    . COLONOSCOPY    . ESOPHAGOGASTRODUODENOSCOPY    . LUMBAR SPINE SURGERY     X4     Allergies  Allergies  Allergen Reactions  . Amlodipine     Weakness in legs  . Losartan Other (See Comments)    Leg numbness/stiffness.   . Oxycodone     crazy  . Pravastatin Sodium     REACTION: bad dreams  . Rosuvastatin     REACTION: palpitations  . Statins     REACTION: aches    History of Present Illness    Richard Burgess has a PMH of HTN, PAD. Presents with acute onset of CP. Coming from home. Under tremendous stress with family issues. Does not want to share why. Pain is substernal. Lasting for hours now. Nitro in the ambulance improved it. Denies PND, orthopnea, LEE or SOB. No missed meds. No cardiac hospitalizations.   Home Medications    Prior to Admission medications   Medication Sig Start Date End Date Taking? Authorizing Provider  aspirin 81 MG tablet Take 81 mg by  mouth daily.      [provider]  Cholecalciferol (SM VITAMIN D3) 1000 UNITS tablet Take 1,000 Units by mouth daily.      [provider]  colesevelam (WELCHOL) 625 MG tablet Take 2 tablets (1,250 mg total) by mouth 2 (two) times daily with a meal. 06/21/13   Plotnikov, Evie Lacks, MD  gabapentin (NEURONTIN) 100 MG capsule Take 1-2 capsules (100-200 mg total) by mouth 3 (three) times daily as needed. 09/10/16   Plotnikov, Evie Lacks, MD  losartan (COZAAR) 100 MG tablet Take 1 tablet (100 mg total) by mouth daily. 06/11/16   Plotnikov, Evie Lacks, MD  Multiple Vitamins-Minerals (VISION-VITE PRESERVE PO) Take 1 tablet by mouth daily.     [provider]  nitroGLYCERIN (NITROSTAT) 0.4 MG SL tablet Place 1 tablet (0.4 mg total) under the tongue every 5 (five) minutes as needed for chest pain. 12/11/16   Plotnikov, Evie Lacks, MD  pantoprazole (PROTONIX) 40 MG tablet Take 1 tablet (40 mg total) by mouth daily. 05/21/16   Plotnikov, Evie Lacks, MD  Red Yeast Rice 600 MG CAPS Take 1 each by mouth 2 (two) times daily.    [provider]  sildenafil (VIAGRA) 100 MG tablet Take 1 tablet (100 mg total) by mouth as needed. 06/21/13  Plotnikov, Evie Lacks, MD  terbinafine (LAMISIL) 250 MG tablet daily. 01/12/15   [provider]  traMADol (ULTRAM) 50 MG tablet Take 1 tablet (50 mg total) by mouth every 8 (eight) hours as needed. 08/27/16   Plotnikov, Evie Lacks, MD  traZODone (DESYREL) 50 MG tablet Reported on 09/05/2015 12/01/14   [provider]  triamcinolone cream (KENALOG) 0.1 % APPLY TO AFFECTED AREA UP TO TWICE A DAY AS NEEDED NOT TO FACE GROIN UNDERARMS 01/03/15   [provider]  triamcinolone ointment (KENALOG) 0.5 % Apply 1 application topically 2 (two) times daily. 04/28/14   Plotnikov, Evie Lacks, MD  valACYclovir (VALTREX) 1000 MG tablet Take 1 tablet (1,000 mg total) by mouth 3 (three) times daily. 08/23/16   Quintella Reichert, MD  zolpidem (AMBIEN) 5 MG  tablet TAKE 1 TABLET BY MOUTH AT BEDTIME AS NEEDED FOR SLEEP 11/27/14   Plotnikov, Evie Lacks, MD    Family History    Family History  Problem Relation Age of Onset  . Dementia Father   . Diabetes Mother   . Peripheral vascular disease Brother   . Colon cancer Sister        dx in her 40's    Social History    Social History   Social History  . Marital status: Married    Spouse name: N/A  . Number of children: 3  . Years of education: N/A   Occupational History  . Retired-Construction    Social History Main Topics  . Smoking status: Former Smoker    Years: 20.00    Types: Cigarettes  . Smokeless tobacco: Never Used     Comment: Daily caffeine use, regular exercise  . Alcohol use 4.2 - 8.4 oz/week    7 - 14 Standard drinks or equivalent per week     Comment: occ beer  . Drug use: No  . Sexual activity: Yes   Other Topics Concern  . Not on file   Social History Narrative  . No narrative on file     Review of Systems    General:  No chills, fever, night sweats or weight changes.  Cardiovascular:  No chest pain, dyspnea on exertion, edema, orthopnea, palpitations, paroxysmal nocturnal dyspnea. Dermatological: No rash, lesions/masses Respiratory: No cough, dyspnea Urologic: No hematuria, dysuria Abdominal:   No nausea, vomiting, diarrhea, bright red blood per rectum, melena, or hematemesis Neurologic:  No visual changes, wkns, changes in mental status. All other systems reviewed and are otherwise negative except as noted above.  Physical Exam    Blood pressure 113/73, pulse (!) 59, temperature 97.9 F (36.6 C), temperature source Oral, resp. rate 14, SpO2 98 %.  General: In distress Psych: Normal affect. Neuro: Alert and oriented X 3. Moves all extremities spontaneously. HEENT: Normal  Neck: Supple without bruits or JVD. Lungs:  Resp regular and unlabored, CTA. Heart: RRR no s3, s4, or murmurs. Abdomen: Soft, non-tender, non-distended, BS + x 4.    Extremities: No clubbing, cyanosis or edema. DP/PT/Radials 2+ and equal bilaterally.  Labs    Troponin [facility] Test)  Recent Labs  01/25/17 0050  TROPIPOC 0.08   No results for input(s): CKTOTAL, CKMB, TROPONINI in the last 72 hours. Lab Results  Component Value Date   WBC 7.0 01/25/2017   HGB 13.1 01/25/2017   HCT 37.7 (L) 01/25/2017   MCV 86.7 01/25/2017   PLT 190 01/25/2017    Recent Labs Lab 01/25/17 0037  NA 138  K 4.2  CL  108  CO2 21*  BUN 15  CREATININE 1.25*  CALCIUM 9.5  GLUCOSE 145*   Lab Results  Component Value Date   CHOL 220 (H) 02/18/2016   HDL 37.50 (L) 02/18/2016   LDLCALC 91 04/22/2010   TRIG 233.0 (H) 02/18/2016   No results found for: [facility]   Radiology Studies    Dg Chest 2 View  Result Date: 01/25/2017 CLINICAL DATA:  Non radiating chest pain while watching race tonight. Fatigue after working outside. EXAM: CHEST  2 VIEW COMPARISON:  Chest radiograph January 21, 2012 FINDINGS: Cardiomediastinal silhouette is normal. No pleural effusions or focal consolidations. Trachea projects midline and there is no pneumothorax. Soft tissue planes and included osseous structures are non-suspicious. Surgical clips in the included right abdomen compatible with cholecystectomy. Mild degenerative change of the thoracic spine. IMPRESSION: No acute cardiopulmonary process. Electronically Signed   By: Elon Alas M.D.   On: 01/25/2017 01:05    ECG & Cardiac Imaging    NSR, ST depression in lateral leads. ST elevation in inferior leads not meeting criteria for STEMI  Assessment & Plan    Richard Burgess has known HTN and PAD. Now with unstable angina. Currently symptoms are mostly controlled. He is very stressed. ECG is concerning but does not meet STEMI criteria.  First POC trop is normal (high end).   Plan: - Admit to cards - Heparin drip - Trend trop - Start metop. HOld statin due to allergies. Continue on ASA.  - Repeat ECG in few hours.  -  Anxiolytics for mental stress - Will start nitro drip if symptoms worsen as he seems to respond to nitro SLN - Possible LHC if symptoms worsen. Otherwise Monday.  Signed, Cristina Gong, MD 01/25/2017, 1:28 AM

## 2017-01-25 NOTE — ED Notes (Signed)
Ordered food tray for patient. Waiting for Cozaar to be verified by pharmacy.

## 2017-01-25 NOTE — ED Notes (Signed)
Lab called critical lab troponin 2.69 paged cardiology.

## 2017-01-25 NOTE — ED Notes (Signed)
Informed Dr.Fundim of elevated troponin of 0.45

## 2017-01-25 NOTE — ED Notes (Signed)
Ordered specific food for patient.

## 2017-01-25 NOTE — ED Notes (Signed)
Lunch tray ordered; heart healthy soft diet

## 2017-01-25 NOTE — ED Notes (Addendum)
Spoke with Cardiology reported troponin 2.69. Patient on heparin and chest pain 0/10.  Ordered to make sure cycle troponin every 6 hours scheduled to have catheterization tomorrow.

## 2017-01-25 NOTE — ED Notes (Signed)
Spoke with pharmacy. Discussed Cozaar has not been verified yet to be administered. Also if they could send Welchol via the tube station.  Stated will have pharmacy tech complete med rec and send they will send the medication.

## 2017-01-25 NOTE — ED Triage Notes (Signed)
PT reporting nausea PRN given.

## 2017-01-25 NOTE — Progress Notes (Signed)
ANTICOAGULATION CONSULT NOTE - Initial Consult  Pharmacy Consult for Heparin  Indication: chest pain/ACS    Vital Signs: Temp: 97.9 F (36.6 C) (08/19 0018) Temp Source: Oral (08/19 0018) BP: 92/55 (08/19 0300) Pulse Rate: 60 (08/19 0215)  Labs:  Recent Labs  01/25/17 0037  HGB 13.1  HCT 37.7*  PLT 190  CREATININE 1.25*  CKTOTAL 133    CrCl cannot be calculated (Unknown ideal weight.).   Medical History: Past Medical History:  Diagnosis Date  . Allergy   . BPH (benign prostatic hypertrophy)   . Carotid artery occlusion   . Diverticulosis of colon   . ED (erectile dysfunction)   . GERD (gastroesophageal reflux disease)   . Glucose intolerance (impaired glucose tolerance)   . History of pancreatitis   . Hx-TIA (transient ischemic attack)   . Hyperlipidemia   . Hypertension   . Hypertention, malignant, with acute intensive management   . Low back pain   . Osteoarthritis   . PVD (peripheral vascular disease) ([place])    Bilateral carotid  . Tubular adenoma of colon 08/1991   One with carcinoma IN [place] 1993, multiple adenomatous   Assessment: 83 y/o M with unstable angina to start heparin. CBC good, mild bump in Scr. PTA meds reviewed. Trending troponin, ?cath Monday.   Goal of Therapy:  Heparin level 0.3-0.7 units/ml Monitor platelets by anticoagulation protocol: Yes   Plan:  Heparin 4000 units BOLUS Start heparin drip at 1000 units/hr 1300 HL Daily CBC/HL Monitor for bleeding   Narda Bonds 01/25/2017,4:13 AM

## 2017-01-25 NOTE — ED Notes (Signed)
Patient does not have his teeth and unable to eggs. Given patient chicken broth and peanut butter to eat.

## 2017-01-25 NOTE — ED Notes (Signed)
Pharmacy tech at bedside 

## 2017-01-25 NOTE — ED Provider Notes (Signed)
TIME SEEN: 12:43 AM  CHIEF COMPLAINT: Chest pain  HPI: Patient is an 83 year old male with history of hypertension, hyperlipidemia, peripheral vascular disease who presents emergency department with complaints of chest pain. He reports that pain started around 11 PM last night. Described as a diffuse anterior chest pain without radiation that feels like a pressure. He states he is also felt "swimmy headed". No shortness of breath, nausea vomiting, diaphoresis. States he has not had a stress test or cardiac catheterization in over 10 years. He states he was previously a smoker but quit 65 years ago. Was given aspirin and nitroglycerin with EMS. Reports nitroglycerin significantly improved his chest discomfort. Denies any aggravating factors.  ROS: See HPI Constitutional: no fever  Eyes: no drainage  ENT: no runny nose   Cardiovascular:   chest pain  Resp: no SOB  GI: no vomiting GU: no dysuria Integumentary: no rash  Allergy: no hives  Musculoskeletal: no leg swelling  Neurological: no slurred speech ROS otherwise negative  PAST MEDICAL HISTORY/PAST SURGICAL HISTORY:  Past Medical History:  Diagnosis Date  . Allergy   . BPH (benign prostatic hypertrophy)   . Carotid artery occlusion   . Diverticulosis of colon   . ED (erectile dysfunction)   . GERD (gastroesophageal reflux disease)   . Glucose intolerance (impaired glucose tolerance)   . History of pancreatitis   . Hx-TIA (transient ischemic attack)   . Hyperlipidemia   . Hypertension   . Hypertention, malignant, with acute intensive management   . Low back pain   . Osteoarthritis   . PVD (peripheral vascular disease) ([place])    Bilateral carotid  . Tubular adenoma of colon 08/1991   One with carcinoma IN [place] 1993, multiple adenomatous    MEDICATIONS:  Prior to Admission medications   Medication Sig Start Date End Date Taking? Authorizing Provider  aspirin 81 MG tablet Take 81 mg by mouth daily.      [provider]  Cholecalciferol (SM VITAMIN D3) 1000 UNITS tablet Take 1,000 Units by mouth daily.      [provider]  colesevelam (WELCHOL) 625 MG tablet Take 2 tablets (1,250 mg total) by mouth 2 (two) times daily with a meal. 06/21/13   Plotnikov, Evie Lacks, MD  gabapentin (NEURONTIN) 100 MG capsule Take 1-2 capsules (100-200 mg total) by mouth 3 (three) times daily as needed. 09/10/16   Plotnikov, Evie Lacks, MD  losartan (COZAAR) 100 MG tablet Take 1 tablet (100 mg total) by mouth daily. 06/11/16   Plotnikov, Evie Lacks, MD  Multiple Vitamins-Minerals (VISION-VITE PRESERVE PO) Take 1 tablet by mouth daily.     [provider]  nitroGLYCERIN (NITROSTAT) 0.4 MG SL tablet Place 1 tablet (0.4 mg total) under the tongue every 5 (five) minutes as needed for chest pain. 12/11/16   Plotnikov, Evie Lacks, MD  pantoprazole (PROTONIX) 40 MG tablet Take 1 tablet (40 mg total) by mouth daily. 05/21/16   Plotnikov, Evie Lacks, MD  Red Yeast Rice 600 MG CAPS Take 1 each by mouth 2 (two) times daily.    [provider]  sildenafil (VIAGRA) 100 MG tablet Take 1 tablet (100 mg total) by mouth as needed. 06/21/13   Plotnikov, Evie Lacks, MD  terbinafine (LAMISIL) 250 MG tablet daily. 01/12/15   [provider]  traMADol (ULTRAM) 50 MG tablet Take 1 tablet (50 mg total) by mouth every 8 (eight) hours as needed. 08/27/16   Plotnikov, Evie Lacks, MD  traZODone (DESYREL) 50  MG tablet Reported on 09/05/2015 12/01/14   [provider]  triamcinolone cream (KENALOG) 0.1 % APPLY TO AFFECTED AREA UP TO TWICE A DAY AS NEEDED NOT TO FACE GROIN UNDERARMS 01/03/15   [provider]  triamcinolone ointment (KENALOG) 0.5 % Apply 1 application topically 2 (two) times daily. 04/28/14   Plotnikov, Evie Lacks, MD  valACYclovir (VALTREX) 1000 MG tablet Take 1 tablet (1,000 mg total) by mouth 3 (three) times daily. 08/23/16   Quintella Reichert, MD  zolpidem (AMBIEN) 5 MG tablet TAKE 1 TABLET BY MOUTH AT BEDTIME AS  NEEDED FOR SLEEP 11/27/14   Plotnikov, Evie Lacks, MD    ALLERGIES:  Allergies  Allergen Reactions  . Amlodipine     Weakness in legs  . Losartan Other (See Comments)    Leg numbness/stiffness.   . Oxycodone     crazy  . Pravastatin Sodium     REACTION: bad dreams  . Rosuvastatin     REACTION: palpitations  . Statins     REACTION: aches    SOCIAL HISTORY:  Social History  Substance Use Topics  . Smoking status: Former Smoker    Years: 20.00    Types: Cigarettes  . Smokeless tobacco: Never Used     Comment: Daily caffeine use, regular exercise  . Alcohol use 4.2 - 8.4 oz/week    7 - 14 Standard drinks or equivalent per week     Comment: occ beer    FAMILY HISTORY: Family History  Problem Relation Age of Onset  . Dementia Father   . Diabetes Mother   . Peripheral vascular disease Brother   . Colon cancer Sister        dx in her 58's    EXAM: BP 116/71   Pulse 63   Temp 97.9 F (36.6 C) (Oral)   Resp 16   SpO2 99%  CONSTITUTIONAL: Alert and oriented and responds appropriately to questions. Elderly, in no significant distress HEAD: Normocephalic EYES: Conjunctivae clear, pupils appear equal, EOMI ENT: normal nose; moist mucous membranes NECK: Supple, no meningismus, no nuchal rigidity, no LAD  CARD: RRR; S1 and S2 appreciated; no murmurs, no clicks, no rubs, no gallops RESP: Normal chest excursion without splinting or tachypnea; breath sounds clear and equal bilaterally; no wheezes, no rhonchi, no rales, no hypoxia or respiratory distress, speaking full sentences ABD/GI: Normal bowel sounds; non-distended; soft, non-tender, no rebound, no guarding, no peritoneal signs, no hepatosplenomegaly BACK:  The back appears normal and is non-tender to palpation, there is no CVA tenderness EXT: Normal ROM in all joints; non-tender to palpation; no edema; normal capillary refill; no cyanosis, no calf tenderness or swelling    SKIN: Normal color for age and race; warm; no  rash NEURO: Moves all extremities equally PSYCH: The patient's mood and manner are appropriate. Grooming and personal hygiene are appropriate.  MEDICAL DECISION MAKING: Patient here with chest pain. His EKG shows minor ST elevation in inferior leads with diffuse ST depression. Cardiology has seen the patient already and has reviewed his EKG. This time we will not call a Code STEMI.  Will continue nitroglycerin to get patient chest pain-free. Will obtain cardiac labs.  ED PROGRESS: Patient's troponin is negative at 0.08. Chest x-ray clear. We'll discuss with cardiology.  1:30 AM  D/w Dr. Raiford Simmonds who saw patient immediately upon arrival in room with EMS.  Appreciate his help. He will admit patient. Recommends repeating troponin. Recurrence holding heparin at this time. Patient and family updated with this plan.  Cardiologist will place admission orders.  Repeat EKG shows some improvement in his dynamic changes.   I reviewed all nursing notes, vitals, pertinent previous records, EKGs, lab and urine results, imaging (as available).      EKG Interpretation  Date/Time:  Sunday January 25 2017 00:14:33 EDT Ventricular Rate:  62 PR Interval:    QRS Duration: 109 QT Interval:  406 QTC Calculation: 413 R Axis:   95 Text Interpretation:  Sinus rhythm Right axis deviation Minimal ST depression, anterolateral leads Baseline wander in lead(s) II III aVF Confirmed by Pryor Curia [phone]) on 01/25/2017 12:43:03 AM         EKG Interpretation  Date/Time:  Sunday January 25 2017 01:20:46 EDT Ventricular Rate:  63 PR Interval:    QRS Duration: 112 QT Interval:  421 QTC Calculation: 431 R Axis:   92 Text Interpretation:  Sinus rhythm Borderline intraventricular conduction delay Confirmed by Pryor Curia [id number]) on 01/25/2017 1:31:29 AM          Ward, Delice Bison, DO 01/25/17 0236

## 2017-01-26 ENCOUNTER — Encounter (HOSPITAL_COMMUNITY)
Admission: EM | Disposition: A | Discharge status: Home / Self Care | Payer: Self-pay | Source: Home / Self Care | Attending: Cardiovascular Disease

## 2017-01-26 DIAGNOSIS — I6523 Occlusion and stenosis of bilateral carotid arteries: Secondary | ICD-10-CM

## 2017-01-26 DIAGNOSIS — I10 Essential (primary) hypertension: Secondary | ICD-10-CM

## 2017-01-26 DIAGNOSIS — E7800 Pure hypercholesterolemia, unspecified: Secondary | ICD-10-CM

## 2017-01-26 DIAGNOSIS — I2510 Atherosclerotic heart disease of native coronary artery without angina pectoris: Secondary | ICD-10-CM

## 2017-01-26 DIAGNOSIS — I214 Non-ST elevation (NSTEMI) myocardial infarction: Secondary | ICD-10-CM

## 2017-01-26 HISTORY — PX: LEFT HEART CATH AND CORONARY ANGIOGRAPHY: CATH118249

## 2017-01-26 LAB — URINALYSIS, ROUTINE W REFLEX MICROSCOPIC
Bilirubin Urine: NEGATIVE
Glucose, UA: NEGATIVE mg/dL
Hgb urine dipstick: NEGATIVE
KETONES UR: NEGATIVE mg/dL
LEUKOCYTES UA: NEGATIVE
NITRITE: NEGATIVE
PROTEIN: NEGATIVE mg/dL
Specific Gravity, Urine: 1.016 (ref 1.005–1.030)
pH: 5 (ref 5.0–8.0)

## 2017-01-26 LAB — LIPID PANEL
CHOLESTEROL: 158 mg/dL (ref 0–200)
HDL: 34 mg/dL — ABNORMAL LOW (ref >40)
LDL CALC: 94 mg/dL (ref 0–99)
Total CHOL/HDL Ratio: 4.6 RATIO
Triglycerides: 148 mg/dL (ref <150)
VLDL: 30 mg/dL (ref 0–40)

## 2017-01-26 LAB — CBC
HEMATOCRIT: 35.5 % — AB (ref 39.0–52.0)
Hemoglobin: 11.8 g/dL — ABNORMAL LOW (ref 13.0–17.0)
MCH: 29.1 pg (ref 26.0–34.0)
MCHC: 33.2 g/dL (ref 30.0–36.0)
MCV: 87.7 fL (ref 78.0–100.0)
Platelets: 178 10*3/uL (ref 150–400)
RBC: 4.05 MIL/uL — ABNORMAL LOW (ref 4.22–5.81)
RDW: 13.4 % (ref 11.5–15.5)
WBC: 9.4 10*3/uL (ref 4.0–10.5)

## 2017-01-26 LAB — HEPARIN LEVEL (UNFRACTIONATED): Heparin Unfractionated: 0.4 IU/mL (ref 0.30–0.70)

## 2017-01-26 SURGERY — LEFT HEART CATH AND CORONARY ANGIOGRAPHY
Anesthesia: LOCAL

## 2017-01-26 MED ORDER — SODIUM CHLORIDE 0.9% FLUSH: 3.0000 mL | INTRAVENOUS | Status: DC | PRN

## 2017-01-26 MED ORDER — MIDAZOLAM HCL 2 MG/2ML IJ SOLN
INTRAMUSCULAR | Status: DC | PRN
  Administered 2017-01-26: 1 mg via INTRAVENOUS

## 2017-01-26 MED ORDER — MIDAZOLAM HCL 2 MG/2ML IJ SOLN
INTRAMUSCULAR | Status: AC
  Filled 2017-01-26: qty 2

## 2017-01-26 MED ORDER — SODIUM CHLORIDE 0.9 % IV SOLN: INTRAVENOUS | Status: AC

## 2017-01-26 MED ORDER — FENTANYL CITRATE (PF) 100 MCG/2ML IJ SOLN
INTRAMUSCULAR | Status: DC | PRN
  Administered 2017-01-26: 25 ug via INTRAVENOUS

## 2017-01-26 MED ORDER — VERAPAMIL HCL 2.5 MG/ML IV SOLN
INTRAVENOUS | Status: AC
  Filled 2017-01-26: qty 2

## 2017-01-26 MED ORDER — LORAZEPAM 2 MG/ML IJ SOLN
INTRAMUSCULAR | Status: AC
  Administered 2017-01-26: 0.5 mg
  Filled 2017-01-26: qty 1

## 2017-01-26 MED ORDER — ONDANSETRON HCL 4 MG/2ML IJ SOLN: 4.0000 mg | Freq: Four times a day (QID) | INTRAMUSCULAR | Status: DC | PRN

## 2017-01-26 MED ORDER — ASPIRIN 81 MG PO CHEW: 81.0000 mg | CHEWABLE_TABLET | Freq: Every day | ORAL | Status: DC

## 2017-01-26 MED ORDER — LIDOCAINE HCL (PF) 1 % IJ SOLN
INTRAMUSCULAR | Status: DC | PRN
  Administered 2017-01-26: 2 mL

## 2017-01-26 MED ORDER — VERAPAMIL HCL 2.5 MG/ML IV SOLN
INTRA_ARTERIAL | Status: DC | PRN
  Administered 2017-01-26: 10 mL via INTRA_ARTERIAL

## 2017-01-26 MED ORDER — EZETIMIBE 10 MG PO TABS
10.0000 mg | ORAL_TABLET | Freq: Every day | ORAL | Status: DC
  Administered 2017-01-26 – 2017-01-27 (×2): 10 mg via ORAL
  Filled 2017-01-26 (×2): qty 1

## 2017-01-26 MED ORDER — HEPARIN (PORCINE) IN NACL 2-0.9 UNIT/ML-% IJ SOLN
INTRAMUSCULAR | Status: AC | PRN
  Administered 2017-01-26: 1000 mL

## 2017-01-26 MED ORDER — IOPAMIDOL (ISOVUE-370) INJECTION 76%
INTRAVENOUS | Status: AC
  Filled 2017-01-26: qty 100

## 2017-01-26 MED ORDER — IOPAMIDOL (ISOVUE-370) INJECTION 76%
INTRAVENOUS | Status: DC | PRN
  Administered 2017-01-26: 75 mL via INTRA_ARTERIAL

## 2017-01-26 MED ORDER — NITROGLYCERIN 1 MG/10 ML FOR IR/CATH LAB
INTRA_ARTERIAL | Status: AC
  Filled 2017-01-26: qty 10

## 2017-01-26 MED ORDER — HEPARIN SODIUM (PORCINE) 1000 UNIT/ML IJ SOLN
INTRAMUSCULAR | Status: DC | PRN
Start: 2017-01-26 — End: 2017-01-26
  Administered 2017-01-26: 4500 [IU] via INTRAVENOUS

## 2017-01-26 MED ORDER — SODIUM CHLORIDE 0.9% FLUSH
3.0000 mL | Freq: Two times a day (BID) | INTRAVENOUS | Status: DC
  Administered 2017-01-27: 3 mL via INTRAVENOUS

## 2017-01-26 MED ORDER — ACETAMINOPHEN 325 MG PO TABS: 650.0000 mg | ORAL_TABLET | ORAL | Status: DC | PRN

## 2017-01-26 MED ORDER — HEPARIN SODIUM (PORCINE) 1000 UNIT/ML IJ SOLN
INTRAMUSCULAR | Status: AC
  Filled 2017-01-26: qty 1

## 2017-01-26 MED ORDER — HEPARIN (PORCINE) IN NACL 2-0.9 UNIT/ML-% IJ SOLN
INTRAMUSCULAR | Status: AC
  Filled 2017-01-26: qty 1000

## 2017-01-26 MED ORDER — SODIUM CHLORIDE 0.9 % IV SOLN: 250.0000 mL | INTRAVENOUS | Status: DC | PRN

## 2017-01-26 MED ORDER — LORAZEPAM 0.5 MG PO TABS
0.5000 mg | ORAL_TABLET | ORAL | Status: DC | PRN
  Administered 2017-01-27: 0.5 mg via ORAL
  Filled 2017-01-26: qty 1

## 2017-01-26 MED ORDER — FENTANYL CITRATE (PF) 100 MCG/2ML IJ SOLN
INTRAMUSCULAR | Status: AC
  Filled 2017-01-26: qty 2

## 2017-01-26 SURGICAL SUPPLY — 13 items
CATH INFINITI 5FR ANG PIGTAIL (CATHETERS) ×2 IMPLANT
CATH OPTITORQUE TIG 4.0 5F (CATHETERS) ×2 IMPLANT
COVER PRB 48X5XTLSCP FOLD TPE (BAG) ×1 IMPLANT
COVER PROBE 5X48 (BAG) ×1
GLIDESHEATH SLEND A-KIT 6F 22G (SHEATH) ×2 IMPLANT
GUIDEWIRE INQWIRE 1.5J.035X260 (WIRE) ×1 IMPLANT
INQWIRE 1.5J .035X260CM (WIRE) ×2
KIT HEART LEFT (KITS) ×2 IMPLANT
PACK CARDIAC CATHETERIZATION (CUSTOM PROCEDURE TRAY) ×2 IMPLANT
SYR MEDRAD MARK V 150ML (SYRINGE) ×2 IMPLANT
TRANSDUCER W/STOPCOCK (MISCELLANEOUS) ×2 IMPLANT
TUBING CIL FLEX 10 FLL-RA (TUBING) ×2 IMPLANT
WIRE HI TORQ VERSACORE-J 145CM (WIRE) ×4 IMPLANT

## 2017-01-26 NOTE — Interval H&P Note (Signed)
Cath Lab Visit (complete for each Cath Lab visit)  Clinical Evaluation Leading to the Procedure:   ACS: Yes.    Non-ACS:    Anginal Classification: CCS III  Anti-ischemic medical therapy: No Therapy  Non-Invasive Test Results: No non-invasive testing performed  Prior CABG: No previous CABG      History and Physical Interval Note:  01/26/2017 1:53 PM  Richard Burgess  has presented today for surgery, with the diagnosis of cp  The various methods of treatment have been discussed with the patient and family. After consideration of risks, benefits and other options for treatment, the patient has consented to  Procedure(s): LEFT HEART CATH AND CORONARY ANGIOGRAPHY (N/A) as a surgical intervention .  The patient's history has been reviewed, patient examined, no change in status, stable for surgery.  I have reviewed the patient's chart and labs.  Questions were answered to the patient's satisfaction.     Quay Burow

## 2017-01-26 NOTE — Progress Notes (Signed)
ANTICOAGULATION CONSULT NOTE  Pharmacy Consult for Heparin  Indication: chest pain/ACS   Vital Signs: Temp: 98.5 F (36.9 C) (08/20 0347) Temp Source: Oral (08/20 0347) BP: 94/58 (08/20 0700) Pulse Rate: 53 (08/20 0700)  Heparin dosing weight: 89.4 kg  Labs:  Recent Labs  01/25/17 0037 01/25/17 0430 01/25/17 1052 01/25/17 1642 01/26/17 0230  HGB 13.1 12.5*  --   --  11.8*  HCT 37.7* 35.8*  --   --  35.5*  PLT 190 176  --   --  178  LABPROT  --  13.3  --   --   --   INR  --  1.01  --   --   --   HEPARINUNFRC  --   --   --  0.20* 0.40  CREATININE 1.25* 1.22  --   --   --   CKTOTAL 133  --   --   --   --   TROPONINI  --  0.45* 2.69* 7.77*  --     Estimated Creatinine Clearance: 46.3 mL/min (by C-G formula based on SCr of 1.22 mg/dL).   Medical History: Past Medical History:  Diagnosis Date  . Allergy   . BPH (benign prostatic hypertrophy)   . Carotid artery occlusion   . Diverticulosis of colon   . ED (erectile dysfunction)   . GERD (gastroesophageal reflux disease)   . Glucose intolerance (impaired glucose tolerance)   . History of pancreatitis   . Hx-TIA (transient ischemic attack)   . Hyperlipidemia   . Hypertension   . Hypertention, malignant, with acute intensive management   . Low back pain   . Osteoarthritis   . PVD (peripheral vascular disease) ([place])    Bilateral carotid  . Tubular adenoma of colon 08/1991   One with carcinoma IN [facility] 1993, multiple adenomatous   Assessment: 88 yoM on heparin infusion for ACS rule out. Heparin level currently therapeutic at 0.40, CBC stable, planning for cardiac cath.  Goal of Therapy:  Heparin level 0.3-0.7 units/ml Monitor platelets by anticoagulation protocol: Yes   Plan:  -Continue heparin 1250 units/hr -Monitor heparin level, CBC, S/Sx bleeding daily   Arrie Senate, PharmD PGY-2 Cardiology Pharmacy Resident Pager: [phone] 01/26/2017

## 2017-01-26 NOTE — Progress Notes (Signed)
Progress Note  Patient Name: Richard Burgess Date of Encounter: 01/26/2017  Primary Cardiologist: New (Dr. Domenic Polite)  Subjective   Feeling well.  Denies chest pain or shortness of breath. Feels like he can go home. Denies lightheadedness or dizziness  Inpatient Medications    Scheduled Meds: . aspirin EC  81 mg Oral Daily  . colesevelam  1,250 mg Oral BID WC  . gabapentin  100 mg Oral TID  . losartan  50 mg Oral Daily  . mouth rinse  15 mL Mouth Rinse BID  . metoprolol tartrate  12.5 mg Oral BID  . pantoprazole  40 mg Oral Daily  . sodium chloride flush  3 mL Intravenous Q12H  . traZODone  50 mg Oral QHS   Continuous Infusions: . sodium chloride    . sodium chloride 1 mL/kg/hr (01/26/17 0700)  . heparin 1,250 Units/hr (01/25/17 2300)   PRN Meds: sodium chloride, acetaminophen, alum & mag hydroxide-simeth, nitroGLYCERIN, nitroGLYCERIN, ondansetron (ZOFRAN) IV, sodium chloride flush, traMADol, zolpidem   Vital Signs    Vitals:   01/26/17 0500 01/26/17 0600 01/26/17 0700 01/26/17 0802  BP:  103/76 (!) 94/58   Pulse: (!) 55 (!) 52 (!) 53   Resp: '14 13 13   ' Temp:    97.9 F (36.6 C)  TempSrc:    Oral  SpO2: 92% 91% 94%   Weight:      Height:        Intake/Output Summary (Last 24 hours) at 01/26/17 0816 Last data filed at 01/26/17 0609  Gross per 24 hour  Intake           909.91 ml  Output              250 ml  Net           659.91 ml   Filed Weights   01/25/17 1756  Weight: 89.4 kg (197 lb 1.5 oz)    Telemetry    Sinus bradycardia, sinus rhythm. Rate upper 40s to 70s. 4 beats of NSVT. - Personally Reviewed  ECG    01/25/17:Sinus bradycardia. Rate 55 bpm.  - Personally Reviewed  Physical Exam   VS:  BP (!) 94/58   Pulse (!) 53   Temp 97.9 F (36.6 C) (Oral)   Resp 13   Ht '5\' 11"'  (1.803 m)   Wt 89.4 kg (197 lb 1.5 oz)   SpO2 94%   BMI 27.49 kg/m  , BMI Body mass index is 27.49 kg/m. GENERAL:  Well appearing.  No acute distress HEENT: Pupils  equal round and reactive, fundi not visualized, oral mucosa unremarkable NECK:  No jugular venous distention, waveform within normal limits, carotid upstroke brisk and symmetric, no bruits LUNGS:  Clear to auscultation bilaterally HEART:  RRR.  PMI not displaced or sustained,S1 and S2 within normal limits, no S3, no S4, no clicks, no rubs, no murmurs ABD:  Flat, positive bowel sounds normal in frequency in pitch, no bruits, no rebound, no guarding, no midline pulsatile mass, no hepatomegaly, no splenomegaly EXT:  2 plus pulses throughout, no edema, no cyanosis no clubbing SKIN:  No rashes no nodules NEURO:  Cranial nerves II through XII grossly intact, motor grossly intact throughout PSYCH:  Cognitively intact, oriented to person place and time   Labs    Chemistry Recent Labs Lab 01/25/17 0037 01/25/17 0430  NA 138 139  K 4.2 4.1  CL 108 108  CO2 21* 23  GLUCOSE 145* 136*  BUN 15 14  CREATININE 1.25* 1.22  CALCIUM 9.5 9.3  GFRNONAA 50* 52*  GFRAA 58* >60  ANIONGAP 9 8     Hematology Recent Labs Lab 01/25/17 0037 01/25/17 0430 01/26/17 0230  WBC 7.0 7.9 9.4  RBC 4.35 4.13* 4.05*  HGB 13.1 12.5* 11.8*  HCT 37.7* 35.8* 35.5*  MCV 86.7 86.7 87.7  MCH 30.1 30.3 29.1  MCHC 34.7 34.9 33.2  RDW 12.9 13.1 13.4  PLT 190 176 178    Cardiac Enzymes Recent Labs Lab 01/25/17 0430 01/25/17 1052 01/25/17 1642  TROPONINI 0.45* 2.69* 7.77*    Recent Labs Lab 01/25/17 0050  TROPIPOC 0.08     BNPNo results for input(s): BNP, PROBNP in the last 168 hours.   DDimer No results for input(s): DDIMER in the last 168 hours.   Radiology    Dg Chest 2 View  Result Date: 01/25/2017 CLINICAL DATA:  Non radiating chest pain while watching race tonight. Fatigue after working outside. EXAM: CHEST  2 VIEW COMPARISON:  Chest radiograph January 21, 2012 FINDINGS: Cardiomediastinal silhouette is normal. No pleural effusions or focal consolidations. Trachea projects midline and there is  no pneumothorax. Soft tissue planes and included osseous structures are non-suspicious. Surgical clips in the included right abdomen compatible with cholecystectomy. Mild degenerative change of the thoracic spine. IMPRESSION: No acute cardiopulmonary process. Electronically Signed   By: Elon Alas M.D.   On: 01/25/2017 01:05    Cardiac Studies   Carotid Dopplers 11/10/16: 60-79% R ICA stenosis.  <40% L ICA stenosis.  Patient Profile     83 y.o. male with hypertension, hyperlipidemia and carotid artery disease here with NSTEMI.  Assessment & Plan    # NSTEMI: Mr. Nix presented with chest pain and troponin is elevated to 7.77.  Plan for left heart catheterization today.  Will order an echo.   # Hypertension:  Continue met by the oprolol and losartan.   # Hyperlipidemia:  Mr. Hirano has myalgias to statins. He will need to be seen in lipid clinic for consideration of a PCSK9 inhibitor.  LDL was 153 02/2016. We will check fasting lipids.  Add ezetimibe 10 mg daily.   # Carotid stenosis: Continue aspirin and start ezetemibe.  Consider PCSK9 as above.    Signed, [name], MD  01/26/2017, 8:16 AM

## 2017-01-26 NOTE — Care Management Note (Signed)
Case Management Note Marvetta Gibbons RN, BSN Unit 4E-Case Manager--2H coverage [phone]  Patient Details  Name: Richard Burgess MRN: [id number] Date of Birth: [birth year]  Subjective/Objective:   Pt admitted with NSTEMI- plan for cardiac cath                 Action/Plan: PTA pt lived at home with wife- anticipate return home- CM to follow for d/c needs post cath.   Expected Discharge Date:                  Expected Discharge Plan:  Home/Self Care  In-House Referral:     Discharge planning Services  CM Consult  Post Acute Care Choice:    Choice offered to:     DME Arranged:    DME Agency:     HH Arranged:    HH Agency:     Status of Service:  In process, will continue to follow  If discussed at Long Length of Stay Meetings, dates discussed:    Discharge Disposition:   Additional Comments:  Dawayne Patricia, RN 01/26/2017, 11:15 AM

## 2017-01-26 NOTE — H&P (View-Only) (Signed)
Progress Note  Patient Name: Richard Burgess Date of Encounter: 01/26/2017  Primary Cardiologist: New (Dr. Domenic Polite)  Subjective   Feeling well.  Denies chest pain or shortness of breath. Feels like he can go home. Denies lightheadedness or dizziness  Inpatient Medications    Scheduled Meds: . aspirin EC  81 mg Oral Daily  . colesevelam  1,250 mg Oral BID WC  . gabapentin  100 mg Oral TID  . losartan  50 mg Oral Daily  . mouth rinse  15 mL Mouth Rinse BID  . metoprolol tartrate  12.5 mg Oral BID  . pantoprazole  40 mg Oral Daily  . sodium chloride flush  3 mL Intravenous Q12H  . traZODone  50 mg Oral QHS   Continuous Infusions: . sodium chloride    . sodium chloride 1 mL/kg/hr (01/26/17 0700)  . heparin 1,250 Units/hr (01/25/17 2300)   PRN Meds: sodium chloride, acetaminophen, alum & mag hydroxide-simeth, nitroGLYCERIN, nitroGLYCERIN, ondansetron (ZOFRAN) IV, sodium chloride flush, traMADol, zolpidem   Vital Signs    Vitals:   01/26/17 0500 01/26/17 0600 01/26/17 0700 01/26/17 0802  BP:  103/76 (!) 94/58   Pulse: (!) 55 (!) 52 (!) 53   Resp: '14 13 13   ' Temp:    97.9 F (36.6 C)  TempSrc:    Oral  SpO2: 92% 91% 94%   Weight:      Height:        Intake/Output Summary (Last 24 hours) at 01/26/17 0816 Last data filed at 01/26/17 0609  Gross per 24 hour  Intake           909.91 ml  Output              250 ml  Net           659.91 ml   Filed Weights   01/25/17 1756  Weight: 89.4 kg (197 lb 1.5 oz)    Telemetry    Sinus bradycardia, sinus rhythm. Rate upper 40s to 70s. 4 beats of NSVT. - Personally Reviewed  ECG    01/25/17:Sinus bradycardia. Rate 55 bpm.  - Personally Reviewed  Physical Exam   VS:  BP (!) 94/58   Pulse (!) 53   Temp 97.9 F (36.6 C) (Oral)   Resp 13   Ht '5\' 11"'  (1.803 m)   Wt 89.4 kg (197 lb 1.5 oz)   SpO2 94%   BMI 27.49 kg/m  , BMI Body mass index is 27.49 kg/m. GENERAL:  Well appearing.  No acute distress HEENT: Pupils  equal round and reactive, fundi not visualized, oral mucosa unremarkable NECK:  No jugular venous distention, waveform within normal limits, carotid upstroke brisk and symmetric, no bruits LUNGS:  Clear to auscultation bilaterally HEART:  RRR.  PMI not displaced or sustained,S1 and S2 within normal limits, no S3, no S4, no clicks, no rubs, no murmurs ABD:  Flat, positive bowel sounds normal in frequency in pitch, no bruits, no rebound, no guarding, no midline pulsatile mass, no hepatomegaly, no splenomegaly EXT:  2 plus pulses throughout, no edema, no cyanosis no clubbing SKIN:  No rashes no nodules NEURO:  Cranial nerves II through XII grossly intact, motor grossly intact throughout PSYCH:  Cognitively intact, oriented to person place and time   Labs    Chemistry Recent Labs Lab 01/25/17 0037 01/25/17 0430  NA 138 139  K 4.2 4.1  CL 108 108  CO2 21* 23  GLUCOSE 145* 136*  BUN 15 14  CREATININE 1.25* 1.22  CALCIUM 9.5 9.3  GFRNONAA 50* 52*  GFRAA 58* >60  ANIONGAP 9 8     Hematology Recent Labs Lab 01/25/17 0037 01/25/17 0430 01/26/17 0230  WBC 7.0 7.9 9.4  RBC 4.35 4.13* 4.05*  HGB 13.1 12.5* 11.8*  HCT 37.7* 35.8* 35.5*  MCV 86.7 86.7 87.7  MCH 30.1 30.3 29.1  MCHC 34.7 34.9 33.2  RDW 12.9 13.1 13.4  PLT 190 176 178    Cardiac Enzymes Recent Labs Lab 01/25/17 0430 01/25/17 1052 01/25/17 1642  TROPONINI 0.45* 2.69* 7.77*    Recent Labs Lab 01/25/17 0050  TROPIPOC 0.08     BNPNo results for input(s): BNP, PROBNP in the last 168 hours.   DDimer No results for input(s): DDIMER in the last 168 hours.   Radiology    Dg Chest 2 View  Result Date: 01/25/2017 CLINICAL DATA:  Non radiating chest pain while watching race tonight. Fatigue after working outside. EXAM: CHEST  2 VIEW COMPARISON:  Chest radiograph January 21, 2012 FINDINGS: Cardiomediastinal silhouette is normal. No pleural effusions or focal consolidations. Trachea projects midline and there is  no pneumothorax. Soft tissue planes and included osseous structures are non-suspicious. Surgical clips in the included right abdomen compatible with cholecystectomy. Mild degenerative change of the thoracic spine. IMPRESSION: No acute cardiopulmonary process. Electronically Signed   By: Elon Alas M.D.   On: 01/25/2017 01:05    Cardiac Studies   Carotid Dopplers 11/10/16: 60-79% R ICA stenosis.  <40% L ICA stenosis.  Patient Profile     83 y.o. male with hypertension, hyperlipidemia and carotid artery disease here with NSTEMI.  Assessment & Plan    # NSTEMI: Mr. Guarisco presented with chest pain and troponin is elevated to 7.77.  Plan for left heart catheterization today.  Will order an echo.   # Hypertension:  Continue met by the oprolol and losartan.   # Hyperlipidemia:  Mr. Mcmanamon has myalgias to statins. He will need to be seen in lipid clinic for consideration of a PCSK9 inhibitor.  LDL was 153 02/2016. We will check fasting lipids.  Add ezetimibe 10 mg daily.   # Carotid stenosis: Continue aspirin and start ezetemibe.  Consider PCSK9 as above.    Signed, [name], MD  01/26/2017, 8:16 AM

## 2017-01-26 NOTE — Progress Notes (Signed)
TR band removed at this time. Site dressed per protocol. No active bleeding. RN will continue to monitor.

## 2017-01-27 ENCOUNTER — Encounter (HOSPITAL_COMMUNITY): Payer: Self-pay | Admitting: Cardiovascular Disease

## 2017-01-27 ENCOUNTER — Telehealth: Payer: Self-pay | Admitting: Cardiovascular Disease

## 2017-01-27 ENCOUNTER — Inpatient Hospital Stay (HOSPITAL_COMMUNITY): Payer: PPO

## 2017-01-27 LAB — CBC
HCT: 33 % — ABNORMAL LOW (ref 39.0–52.0)
Hemoglobin: 11 g/dL — ABNORMAL LOW (ref 13.0–17.0)
MCH: 29.4 pg (ref 26.0–34.0)
MCHC: 33.3 g/dL (ref 30.0–36.0)
MCV: 88.2 fL (ref 78.0–100.0)
PLATELETS: 139 10*3/uL — AB (ref 150–400)
RBC: 3.74 MIL/uL — ABNORMAL LOW (ref 4.22–5.81)
RDW: 13.4 % (ref 11.5–15.5)
WBC: 9.3 10*3/uL (ref 4.0–10.5)

## 2017-01-27 LAB — BASIC METABOLIC PANEL
Anion gap: 7 (ref 5–15)
BUN: 14 mg/dL (ref 6–20)
CHLORIDE: 106 mmol/L (ref 101–111)
CO2: 20 mmol/L — AB (ref 22–32)
Calcium: 8.3 mg/dL — ABNORMAL LOW (ref 8.9–10.3)
Creatinine, Ser: 1.08 mg/dL (ref 0.61–1.24)
GFR calc Af Amer: >60 mL/min (ref >60)
GFR calc non Af Amer: >60 mL/min (ref >60)
Glucose, Bld: 147 mg/dL — ABNORMAL HIGH (ref 65–99)
Potassium: 3.8 mmol/L (ref 3.5–5.1)
SODIUM: 133 mmol/L — AB (ref 135–145)

## 2017-01-27 MED ORDER — METOPROLOL TARTRATE 25 MG PO TABS: 12.5000 mg | ORAL_TABLET | Freq: Two times a day (BID) | ORAL | 5 refills | Status: DC

## 2017-01-27 MED ORDER — LOSARTAN POTASSIUM 25 MG PO TABS
25.0000 mg | ORAL_TABLET | Freq: Every day | ORAL | Status: DC
  Administered 2017-01-27: 25 mg via ORAL
  Filled 2017-01-27: qty 1

## 2017-01-27 MED ORDER — EZETIMIBE 10 MG PO TABS: 10.0000 mg | ORAL_TABLET | Freq: Every day | ORAL | 11 refills | Status: DC

## 2017-01-27 MED ORDER — CLOPIDOGREL BISULFATE 75 MG PO TABS
75.0000 mg | ORAL_TABLET | Freq: Every day | ORAL | 11 refills | Status: DC
Start: 2017-01-28 — End: 2018-06-03

## 2017-01-27 MED ORDER — LOSARTAN POTASSIUM 25 MG PO TABS: 25.0000 mg | ORAL_TABLET | Freq: Every day | ORAL | 5 refills | Status: DC

## 2017-01-27 MED ORDER — CLOPIDOGREL BISULFATE 75 MG PO TABS
75.0000 mg | ORAL_TABLET | Freq: Every day | ORAL | Status: DC
  Administered 2017-01-27: 75 mg via ORAL
  Filled 2017-01-27: qty 1

## 2017-01-27 NOTE — Progress Notes (Signed)
CARDIAC REHAB PHASE I   PRE:  Rate/Rhythm: 74 SR  BP:  Supine:   Sitting: 98/66  Standing:    SaO2: 98%RA  MODE:  Ambulation: 370 ft   POST:  Rate/Rhythm: 95 SR  BP:  Supine:   Sitting: 118/60  Standing:    SaO2: 98%RA 0955-1048 Pt walked 370 ft on RA with asst x 1 with gait a little wobbly. Stated he is wobbly all the time as he has bad knees. Discussed MI restrictions, NTG use, heart healthy diet and walking as tolerated due to limitation from knees. Encouraged to watch his carbs as A1C a little elevated. Daughters state he eats lots of ice cream and such. To recliner after walk. Will refer to [facility] but pt stated he is not interested.   Graylon Good, RN BSN  01/27/2017 10:43 AM

## 2017-01-27 NOTE — Progress Notes (Signed)
Progress Note  Patient Name: Richard Burgess Date of Encounter: 01/27/2017  Primary Cardiologist: New (Dr. Domenic Polite)  Subjective   Feeling well.  Denies chest pain or shortness of breath. Wants to go home   Inpatient Medications    Scheduled Meds: . aspirin EC  81 mg Oral Daily  . clopidogrel  75 mg Oral Daily  . ezetimibe  10 mg Oral Daily  . losartan  25 mg Oral Daily  . mouth rinse  15 mL Mouth Rinse BID  . metoprolol tartrate  12.5 mg Oral BID  . sodium chloride flush  3 mL Intravenous Q12H   Continuous Infusions: . sodium chloride     PRN Meds: sodium chloride, acetaminophen, alum & mag hydroxide-simeth, LORazepam, nitroGLYCERIN, ondansetron (ZOFRAN) IV, sodium chloride flush, traMADol, zolpidem   Vital Signs    Vitals:   01/27/17 0600 01/27/17 0700 01/27/17 0800 01/27/17 0824  BP: (!) 85/57  (!) 97/55   Pulse: 71 65 65   Resp: 18 10 10    Temp:    99.3 F (37.4 C)  TempSrc:    Oral  SpO2: 91% 96% 96%   Weight:      Height:        Intake/Output Summary (Last 24 hours) at 01/27/17 0837 Last data filed at 01/26/17 2200  Gross per 24 hour  Intake          1758.53 ml  Output              650 ml  Net          1108.53 ml   Filed Weights   01/25/17 1756  Weight: 89.4 kg (197 lb 1.5 oz)    Telemetry    Sinus bradycardia, sinus rhythm. Rate upper 40s to 70s. 4 beats of NSVT. - Personally Reviewed  ECG    01/25/17:Sinus bradycardia. Rate 55 bpm.  - Personally Reviewed  Physical Exam   VS:  BP (!) 97/55   Pulse 65   Temp 99.3 F (37.4 C) (Oral)   Resp 10   Ht 5\' 11"  (1.803 m)   Wt 89.4 kg (197 lb 1.5 oz)   SpO2 96%   BMI 27.49 kg/m  , BMI Body mass index is 27.49 kg/m. GENERAL:  Well appearing HEENT: Pupils equal round and reactive, fundi not visualized, oral mucosa unremarkable NECK:  No jugular venous distention, waveform within normal limits, carotid upstroke brisk and symmetric, no bruits, no thyromegaly LYMPHATICS:  No cervical  adenopathy LUNGS:  Clear to auscultation bilaterally HEART:  RRR.  PMI not displaced or sustained,S1 and S2 within normal limits, no S3, no S4, no clicks, no rubs, no murmurs ABD:  Flat, positive bowel sounds normal in frequency in pitch, no bruits, no rebound, no guarding, no midline pulsatile mass, no hepatomegaly, no splenomegaly EXT:  2 plus pulses throughout, no edema, no cyanosis no clubbing.  R wrist without ecchymosis or hematoma SKIN:  No rashes no nodules NEURO:  Cranial nerves II through XII grossly intact, motor grossly intact throughout [facility]:  Cognitively intact, oriented to person place and time   Labs    Chemistry  Recent Labs Lab 01/25/17 0037 01/25/17 0430 01/27/17 0338  NA 138 139 133*  K 4.2 4.1 3.8  CL 108 108 106  CO2 21* 23 20*  GLUCOSE 145* 136* 147*  BUN 15 14 14   CREATININE 1.25* 1.22 1.08  CALCIUM 9.5 9.3 8.3*  GFRNONAA 50* 52* >60  GFRAA 58* >60 >60  ANIONGAP 9  8 7     Hematology  Recent Labs Lab 01/25/17 0430 01/26/17 0230 01/27/17 0338  WBC 7.9 9.4 9.3  RBC 4.13* 4.05* 3.74*  HGB 12.5* 11.8* 11.0*  HCT 35.8* 35.5* 33.0*  MCV 86.7 87.7 88.2  MCH 30.3 29.1 29.4  MCHC 34.9 33.2 33.3  RDW 13.1 13.4 13.4  PLT 176 178 139*    Cardiac Enzymes  Recent Labs Lab 01/25/17 0430 01/25/17 1052 01/25/17 1642  TROPONINI 0.45* 2.69* 7.77*     Recent Labs Lab 01/25/17 0050  TROPIPOC 0.08     BNPNo results for input(s): BNP, PROBNP in the last 168 hours.   DDimer No results for input(s): DDIMER in the last 168 hours.   Radiology    No results found.  Cardiac Studies   Carotid Dopplers 11/10/16: 60-79% R ICA stenosis.  <40% L ICA stenosis.  LHC 01/27/17:  Prox RCA to Mid RCA lesion, 100 %stenosed.  Ost LM lesion, 40 %stenosed.  Ost LAD lesion, 60 %stenosed.  1st Mrg lesion, 80 %stenosed.  There is mild to moderate left ventricular systolic dysfunction.  LV end diastolic pressure is mildly elevated.  The left  ventricular ejection fraction is 45-50% by visual estimate.    Patient Profile     83 y.o. male with hypertension, hyperlipidemia and carotid artery disease here with NSTEMI.  Assessment & Plan    # NSTEMI: Mr. Sawatzky presented with chest pain and troponin is elevated to 7.77.  LHC showed 100% RCA stenosis, indicating chronic occlusion.  He also has an 80% OM1 lesion, but this was a small vessel not amenable to PCI.  He has moderate LM and LAD disease.  Medical management was recommended.  We will continue aspirin and add clopidogrel 75mg  daily with plans for 12 months of DAPT.  Continue metoprolol.   # Hypertension:  BP has been low.  Metoprolol was held yesterday.  We will reduce losartan to 25mg  daily.  Continue metoprolol 12.5mg  bid.  Will need to consolidate eventually given reduced systolic function.    # Hyperlipidemia:  Mr. Crunkleton has myalgias to statins. He will need to be seen in lipid clinic for consideration of a PCSK9 inhibitor.  LDL was 153 02/2016. LDL 94 this admission.  Zetia was added this admission.  If LDL remains >70, he will need a PCSK9 inhibitor.  Check lipids in 6 weeks.   # Carotid stenosis: Continue aspirin and ezetemibe.  Consider PCSK9 as above.    Mr. Turay is stable for discharge from a cardiology standpoint if he remains chest pain free and has no lightheadedness with ambulation.  We will arrange follow up.    Signed, [name], MD  01/27/2017, 8:37 AM

## 2017-01-27 NOTE — Telephone Encounter (Signed)
Spoke to daughter, she is on patient's DPR for care discussion.  I went over discharge instructions which included a dose reduction of his losartan and introduction of low dose metoprolol. Daughter expresses these were the meds she wished to verify. I did advise per discharge instructions, these are both supposed to be taken. Explained action of these medications - each works differently and that it appears the losartan dose was reduced in order to start metoprolol. She will discuss further w Dr. Oval Linsey at post-hosp visit. I confirmed appt details on phone. Aware to call sooner if new concerns or needs identified.

## 2017-01-27 NOTE — Care Management Note (Signed)
Case Management Note Marvetta Gibbons RN, BSN Unit 4E-Case Manager--2H coverage [phone]  Patient Details  Name: Richard Burgess MRN: [id number] Date of Birth: January 31, 1929  Subjective/Objective:   Pt admitted with NSTEMI- plan for cardiac cath                 Action/Plan: PTA pt lived at home with wife- anticipate return home- CM to follow for d/c needs post cath.   Expected Discharge Date:  01/27/17               Expected Discharge Plan:  Home/Self Care  In-House Referral:     Discharge planning Services  CM Consult, Medication Assistance  Post Acute Care Choice:    Choice offered to:     DME Arranged:    DME Agency:     HH Arranged:    HH Agency:     Status of Service:  Completed, signed off  If discussed at [employer] of Stay Meetings, dates discussed:    Discharge Disposition: home/self care   Additional Comments:  01/27/17- 48- Marvetta Gibbons RN, CM- called by discharging bedside RN- regarding pt's d/c medications- per pt he gets his medications from the [place]- spoke with pt and daughter at the bedside- pt has PCP at the [facility] clinic- is not sure of PCP name- states he gets his meds via the [place]- per daughter pt has also picked up medications from [facility] when needed. Call made to [facility] where medications are ready for pick up total cost for 4 scripts is $26.90- daughter states they will pick meds up at [facility] today [facility]. Have faxed a copy of pt's d/c summary to the [facility] for pt's PCP to see what new medications pt has been placed on for the [place] to approve. Pt will d/c home with daughter, wife at home also.   Dawayne Patricia, RN 01/27/2017, 12:37 PM

## 2017-01-27 NOTE — Discharge Summary (Signed)
Discharge Summary    Patient ID: Richard Burgess,  MRN: [id number], DOB/AGE: Mar 09, 1929 83 y.o.  Admit date: 01/25/2017 Discharge date: 01/27/2017  Primary Care Provider: Cassandria Anger Primary Cardiologist: New- Dr. Oval Linsey  Discharge Diagnoses    Active Problems:   Unstable angina [facility])   Non-ST elevation (NSTEMI) myocardial infarction [facility])   Pure hypercholesterolemia  Allergies Allergies  Allergen Reactions  . Amlodipine Other (See Comments)    Weakness in legs  . Losartan Other (See Comments)    Leg numbness/stiffness.   . Oxycodone Other (See Comments)    crazy  . Pravastatin Sodium Other (See Comments)    REACTION: bad dreams  . Rosuvastatin Other (See Comments)    REACTION: palpitations  . Statins Other (See Comments)    REACTION: aches    Diagnostic Studies/Procedures    LEFT HEART CATH AND CORONARY ANGIOGRAPHY 01/26/17  Conclusion   Prox RCA to Mid RCA lesion, 100 %stenosed.  Ost LM lesion, 40 %stenosed.  Ost LAD lesion, 60 %stenosed.  1st Mrg lesion, 80 %stenosed.  There is mild to moderate left ventricular systolic dysfunction.  LV end diastolic pressure is mildly elevated.  The left ventricular ejection fraction is 45-50% by visual estimate.   IMPRESSION: Richard Burgess has an occluded dominant right which left right collaterals and a moderate ostial LAD stenosis. He does have a high-grade high first marginal branch but this is a relatively small vessel in caliber. He looks like he has a completed RCA infarct with inferobasal hypokinesia. I recommend optimizing his medical therapy at this time. The sheath was removed and a TR band was placed on the right wrist to achieve patent hemostasis. The patient left the lab in stable condition.  Quay Burow. MD, [facility]    _____________   History of Present Illness     Mr Burgess has a PMH of HTN, PAD. Presented on 01/25/17 with acute onset of CP. Coming from home. Under tremendous stress with  family issues. Does not want to share why. Pain is substernal. Lasting for hours now. Nitro in the ambulance improved it. Denied PND, orthopnea, LEE or SOB. No missed meds. No previous cardiac hospitalizations.   Hospital Course     Consultants: None  # NSTEMI: Richard Burgess presented with chest pain and troponin is elevated to 7.77.  LHC on 01/26/17 showed 100% RCA stenosis, indicating chronic occlusion.  He also has an 80% OM1 lesion, but this was a small vessel not amenable to PCI.  He has moderate LM and LAD disease.  Medical management was recommended.  We will continue aspirin and add clopidogrel 75mg  daily with plans for 12 months of DAPT.  Continue metoprolol.   # Hypertension:  BP has been low.  Metoprolol was held yesterday.  We will reduce losartan to 25mg  daily.  Continue metoprolol 12.5mg  bid.  Will need to consolidate eventually given reduced systolic function.    # Hyperlipidemia:  Richard Burgess has myalgias to statins. He will need to be seen in lipid clinic for consideration of a PCSK9 inhibitor.  LDL was 153 02/2016. LDL 94 this admission.  Zetia was added this admission.  If LDL remains >70, he will need a PCSK9 inhibitor.  Check lipids in 6 weeks.   # Carotid stenosis: Continue aspirin and ezetemibe.  Consider PCSK9 as above.    Richard Burgess was seen by Dr. Oval Linsey and is stable for discharge from a cardiology standpoint. We will arrange follow up.    The  patient does not want to travel to [facility] to see Dr. Domenic Polite. He would like to stay in [place]. Follow up arranged with Dr. Oval Linsey.   At time of discharge SCr 1.08, K+ 3.8, Hgb 11.0  _____________  Discharge Vitals Blood pressure 98/66, pulse 77, temperature 99.3 F (37.4 C), temperature source Oral, resp. rate 10, height 5\' 11"  (1.803 m), weight 197 lb 1.5 oz (89.4 kg), SpO2 97 %.  Filed Weights   01/25/17 1756  Weight: 197 lb 1.5 oz (89.4 kg)    Labs & Radiologic Studies    CBC  Recent Labs   01/26/17 0230 01/27/17 0338  WBC 9.4 9.3  HGB 11.8* 11.0*  HCT 35.5* 33.0*  MCV 87.7 88.2  PLT 178 476*   Basic Metabolic Panel  Recent Labs  01/25/17 0430 01/27/17 0338  NA 139 133*  K 4.1 3.8  CL 108 106  CO2 23 20*  GLUCOSE 136* 147*  BUN 14 14  CREATININE 1.22 1.08  CALCIUM 9.3 8.3*   Liver Function Tests No results for input(s): AST, ALT, ALKPHOS, BILITOT, PROT, ALBUMIN in the last 72 hours. No results for input(s): LIPASE, AMYLASE in the last 72 hours. Cardiac Enzymes  Recent Labs  01/25/17 0037 01/25/17 0430 01/25/17 1052 01/25/17 1642  CKTOTAL 133  --   --   --   TROPONINI  --  0.45* 2.69* 7.77*   BNP Invalid input(s): POCBNP D-Dimer No results for input(s): DDIMER in the last 72 hours. Hemoglobin A1C  Recent Labs  01/25/17 0430  HGBA1C 5.9*   Fasting Lipid Panel  Recent Labs  01/26/17 0900  CHOL 158  HDL 34*  LDLCALC 94  TRIG 148  CHOLHDL 4.6   Thyroid Function Tests No results for input(s): TSH, T4TOTAL, T3FREE, THYROIDAB in the last 72 hours.  Invalid input(s): FREET3 _____________  Dg Chest 2 View  Result Date: 01/25/2017 CLINICAL DATA:  Non radiating chest pain while watching race tonight. Fatigue after working outside. EXAM: CHEST  2 VIEW COMPARISON:  Chest radiograph January 21, 2012 FINDINGS: Cardiomediastinal silhouette is normal. No pleural effusions or focal consolidations. Trachea projects midline and there is no pneumothorax. Soft tissue planes and included osseous structures are non-suspicious. Surgical clips in the included right abdomen compatible with cholecystectomy. Mild degenerative change of the thoracic spine. IMPRESSION: No acute cardiopulmonary process. Electronically Signed   By: Elon Alas M.D.   On: 01/25/2017 01:05   Disposition   Pt is being discharged home today in good condition.  Follow-up Plans & Appointments    Follow-up Information    [name], MD Follow up.   Specialty:   Cardiology Why:  On September 5th at 10:20 for cardiology hospital follow up.  Contact information: [address] [place] [address] [phone]          Discharge Instructions    Amb Referral to Cardiac Rehabilitation    Complete by:  As directed    Diagnosis:  NSTEMI   Diet - low sodium heart healthy    Complete by:  As directed    Discharge instructions    Complete by:  As directed    PLEASE REMEMBER TO BRING ALL OF YOUR MEDICATIONS TO EACH OF YOUR FOLLOW-UP OFFICE VISITS.  PLEASE ATTEND ALL SCHEDULED FOLLOW-UP APPOINTMENTS.   Activity: Increase activity slowly as tolerated. You may shower, but no soaking baths (or swimming) for 1 week. No driving for 1 week. No lifting over 10 lbs for 2 weeks. No sexual activity  for 2 weeks.   You May Return to Work: in 1 week (if applicable)  Wound Care: You may wash cath site gently with soap and water. Keep cath site clean and dry. If you notice pain, swelling, bleeding or pus at your cath site, please call [phone].   Increase activity slowly    Complete by:  As directed       Discharge Medications   Current Discharge Medication List    START taking these medications   Details  clopidogrel (PLAVIX) 75 MG tablet Take 1 tablet (75 mg total) by mouth daily. Qty: 30 tablet, Refills: 11    ezetimibe (ZETIA) 10 MG tablet Take 1 tablet (10 mg total) by mouth daily. Qty: 30 tablet, Refills: 11    metoprolol tartrate (LOPRESSOR) 25 MG tablet Take 0.5 tablets (12.5 mg total) by mouth 2 (two) times daily. Qty: 30 tablet, Refills: 5      CONTINUE these medications which have CHANGED   Details  losartan (COZAAR) 25 MG tablet Take 1 tablet (25 mg total) by mouth daily. Qty: 30 tablet, Refills: 5      CONTINUE these medications which have NOT CHANGED   Details  aspirin EC 81 MG tablet Take 81 mg by mouth daily.    cholecalciferol (VITAMIN D) 1000 units tablet Take 1,000 Units by mouth daily.    cyanocobalamin  500 MCG tablet Take 500 mcg by mouth daily. Vitamin B12    Multiple Vitamins-Minerals (PRESERVISION AREDS 2) CAPS Take 1 capsule by mouth daily with supper.    nitroGLYCERIN (NITROSTAT) 0.4 MG SL tablet Place 1 tablet (0.4 mg total) under the tongue every 5 (five) minutes as needed for chest pain. Qty: 20 tablet, Refills: 1    Polyethyl Glycol-Propyl Glycol (SYSTANE OP) Place 1 drop into both eyes 4 (four) times daily as needed (dry eyes).    ranitidine (ZANTAC) 150 MG tablet Take 150 mg by mouth daily.    sildenafil (VIAGRA) 100 MG tablet Take 1 tablet (100 mg total) by mouth as needed. Qty: 90 tablet, Refills: 3    traMADol (ULTRAM) 50 MG tablet Take 1 tablet (50 mg total) by mouth every 8 (eight) hours as needed. Qty: 90 tablet, Refills: 2    zolpidem (AMBIEN) 5 MG tablet TAKE 1 TABLET BY MOUTH AT BEDTIME AS NEEDED FOR SLEEP Qty: 30 tablet, Refills: 3      STOP taking these medications     Plant Sterols and Stanols (CHOLESTOFF PO)      colesevelam (WELCHOL) 625 MG tablet      gabapentin (NEURONTIN) 100 MG capsule      pantoprazole (PROTONIX) 40 MG tablet      triamcinolone ointment (KENALOG) 0.5 %      aspirin 81 MG tablet          Aspirin prescribed at discharge?  Yes High Intensity Statin Prescribed? (Lipitor 40-80mg  or Crestor 20-40mg ): No: Pt intolerant to statins Beta Blocker Prescribed? Yes For EF <40%, was ACEI/ARB Prescribed? No: N/A EF 45-50% ADP Receptor Inhibitor Prescribed? (i.e. Plavix etc.-Includes Medically Managed Patients): Yes For EF <40%, Aldosterone Inhibitor Prescribed? No: EF 45-50% Was EF assessed during THIS hospitalization? Yes Was Cardiac Rehab II ordered? (Included Medically managed Patients): Yes   Outstanding Labs/Studies   Check lipids in 6 weeks.   Duration of Discharge Encounter   Greater than 30 minutes including physician time.  Signed, Daune Perch NP 01/27/2017, 11:29 AM

## 2017-01-27 NOTE — Telephone Encounter (Signed)
New message    Pt daughter is calling because pt was discharged from the hospital. He was given new BP medication and she wants to know if pt should take them both. Please call.

## 2017-01-27 NOTE — Progress Notes (Signed)
Patient discharge paperwork gone over with patient and daughter. All questions answered to patient satisfaction. All new medications gone over and patient states, "I don't know what I take, my wife gives me pills."  Encouraged patient to ask questions. Educated family on discharge paperwork and medications. CM to the bedside to help facilitate discharge and [facility] prescriptions. Patient states, " I will go to [facility] and get these medicines this time." IV removed intact. Telemetry discontinued. Patient discharged to home with daughter by way of wheelchair. VSS.  Lucius Conn, RN

## 2017-01-28 ENCOUNTER — Telehealth: Payer: Self-pay | Admitting: *Deleted

## 2017-01-28 NOTE — Telephone Encounter (Signed)
Pt was on TCM list admitted 01/25/17 Unstable angina (HCC),   Non-ST elevation (NSTEMI) myocardial infarction [facility]), and  Pure hypercholesterolemia. Pt was D/C on 8/21, and will be follow-up w/cardiology...Marylin Crosby, MD Follow up.   Specialty:  Cardiology Why:  On September 5th at 10:20 for cardiology hospital follow up.

## 2017-02-10 NOTE — Progress Notes (Signed)
Cardiology Office Note   Date:  02/11/2017   ID:  Richard Burgess, DOB 04-Sep-1928, MRN [id number]  PCP:  Cassandria Anger, MD  Cardiologist:   [name], MD   No chief complaint on file.   History of Present Illness: Richard Burgess is a 83 y.o. male with hypertension, hyperlipidemia, aortic aneurysm, carotid stenosis, R subclavian steal, who presents for follow up.  Richard Burgess presented to the ED with NSTEMI 01/2017.  He underwent left heart catheterization 01/2017 and was noted to have 3v CAD (100% RCA, 40% OM, 60% ostial LAD, and 80% OM1.  LVEF was 45-50%.  He had L-->R collaterals to the LAD.  Medical management was recommended.  He was started on clopidogrel with plans for 12 months of DAPT.  He had a history of statin intolerance.  He was started on Zetia in the hospital.  LDL was 94.  Since leaving the hospital he has been feeling OK.  He notes angina with exertion.  His wife reports that he has been outside in the hot weather cutting grass and chopping wood for hours at a time.  When he does this he gets chest pain and has to lay down.  It improves within 10 minues.  There is no associated shortness of breath, nausea or diaphoresis.  He has required several tablets of nitroglycerin. He denies lower extremity edema, orthopnea or PND.  He hasn't noted any myalgias.    Past Medical History:  Diagnosis Date  . Allergy   . BPH (benign prostatic hypertrophy)   . Carotid artery occlusion   . Diverticulosis of colon   . ED (erectile dysfunction)   . GERD (gastroesophageal reflux disease)   . Glucose intolerance (impaired glucose tolerance)   . History of pancreatitis   . Hx-TIA (transient ischemic attack)   . Hyperlipidemia   . Hypertension   . Hypertention, malignant, with acute intensive management   . Low back pain   . Osteoarthritis   . PVD (peripheral vascular disease) ([place])    Bilateral carotid  . Tubular adenoma of colon 08/1991   One with carcinoma IN [facility] 1993,  multiple adenomatous    Past Surgical History:  Procedure Laterality Date  . APPENDECTOMY    . BASAL CELL CARCINOMA EXCISION    . CHOLECYSTECTOMY    . COLONOSCOPY    . ESOPHAGOGASTRODUODENOSCOPY    . LEFT HEART CATH AND CORONARY ANGIOGRAPHY N/A 01/26/2017   Procedure: LEFT HEART CATH AND CORONARY ANGIOGRAPHY;  Surgeon: Lorretta Harp, MD;  Location: [place] [facility];  Service: Cardiovascular;  Laterality: N/A;  . LUMBAR SPINE SURGERY     X4     Current Outpatient Prescriptions  Medication Sig Dispense Refill  . aspirin EC 81 MG tablet Take 81 mg by mouth daily.    . cholecalciferol (VITAMIN D) 1000 units tablet Take 1,000 Units by mouth daily.    . clopidogrel (PLAVIX) 75 MG tablet Take 1 tablet (75 mg total) by mouth daily. 30 tablet 11  . cyanocobalamin 500 MCG tablet Take 500 mcg by mouth daily. Vitamin B12    . ezetimibe (ZETIA) 10 MG tablet Take 1 tablet (10 mg total) by mouth daily. 30 tablet 11  . losartan (COZAAR) 25 MG tablet Take 1 tablet (25 mg total) by mouth daily. 30 tablet 5  . Multiple Vitamins-Minerals (PRESERVISION AREDS 2) CAPS Take 1 capsule by mouth daily with supper.    . nitroGLYCERIN (NITROSTAT) 0.4 MG SL tablet Place 1  tablet (0.4 mg total) under the tongue every 5 (five) minutes as needed for chest pain. 20 tablet 1  . Polyethyl Glycol-Propyl Glycol (SYSTANE OP) Place 1 drop into both eyes 4 (four) times daily as needed (dry eyes).    . ranitidine (ZANTAC) 150 MG tablet Take 150 mg by mouth daily.    . traMADol (ULTRAM) 50 MG tablet Take 1 tablet (50 mg total) by mouth every 8 (eight) hours as needed. (Patient taking differently: Take 50 mg by mouth every 8 (eight) hours as needed (pain). ) 90 tablet 2  . zolpidem (AMBIEN) 5 MG tablet TAKE 1 TABLET BY MOUTH AT BEDTIME AS NEEDED FOR SLEEP 30 tablet 3  . isosorbide mononitrate (IMDUR) 30 MG 24 hr tablet Take 1 tablet (30 mg total) by mouth daily. 90 tablet 3  . metoprolol succinate (TOPROL XL) 25 MG 24 hr  tablet Take 1 tablet (25 mg total) by mouth daily. 90 tablet 3   No current facility-administered medications for this visit.     Allergies:   Amlodipine; Losartan; Oxycodone; Pravastatin sodium; Rosuvastatin; and Statins    Social History:  The patient  reports that he has quit smoking. His smoking use included Cigarettes. He quit after 20.00 years of use. He has never used smokeless tobacco. He reports that he drinks about 4.2 - 8.4 oz of alcohol per week . He reports that he does not use drugs.   Family History:  The patient's family history includes Colon cancer in his sister; Dementia in his father; Diabetes in his mother; Peripheral vascular disease in his brother.    ROS:  Please see the history of present illness.   Otherwise, review of systems are positive for none.   All other systems are reviewed and negative.    PHYSICAL EXAM: VS:  BP 127/70   Pulse 69   Ht 5\' 11"  (1.803 m)   Wt 87.3 kg (192 lb 6.4 oz)   BMI 26.83 kg/m  , BMI Body mass index is 26.83 kg/m. GENERAL:  Well appearing HEENT:  Pupils equal round and reactive, fundi not visualized, oral mucosa unremarkable NECK:  No jugular venous distention, waveform within normal limits, carotid upstroke brisk and symmetric, no bruits LUNGS:  Clear to auscultation bilaterally HEART:  RRR.  PMI not displaced or sustained,S1 and S2 within normal limits, no S3, no S4, no clicks, no rubs, no murmurs ABD:  Flat, positive bowel sounds normal in frequency in pitch, no bruits, no rebound, no guarding, no midline pulsatile mass, no hepatomegaly, no splenomegaly EXT:  2 plus pulses throughout, no edema, no cyanosis no clubbing SKIN:  No rashes no nodules NEURO:  Cranial nerves II through XII grossly intact, motor grossly intact throughout PSYCH:  Cognitively intact, oriented to person place and time   EKG:  EKG is not ordered today.   LEFT HEART CATH AND CORONARY ANGIOGRAPHY 01/26/17  Conclusion   Prox RCA to Mid RCA lesion,  100 %stenosed.  Ost LM lesion, 40 %stenosed.  Ost LAD lesion, 60 %stenosed.  1st Mrg lesion, 80 %stenosed.  There is mild to moderate left ventricular systolic dysfunction.  LV end diastolic pressure is mildly elevated.  The left ventricular ejection fraction is 45-50% by visual estimate.    Recent Labs: 02/18/2016: ALT 15; TSH 2.88 01/27/2017: BUN 14; Creatinine, Ser 1.08; Hemoglobin 11.0; Platelets 139; Potassium 3.8; Sodium 133    Lipid Panel    Component Value Date/Time   CHOL 158 01/26/2017 0900   TRIG 148  01/26/2017 0900   HDL 34 (L) 01/26/2017 0900   CHOLHDL 4.6 01/26/2017 0900   VLDL 30 01/26/2017 0900   LDLCALC 94 01/26/2017 0900   LDLDIRECT 153.0 02/18/2016 0826      Wt Readings from Last 3 Encounters:  02/11/17 87.3 kg (192 lb 6.4 oz)  01/25/17 89.4 kg (197 lb 1.5 oz)  12/11/16 90.7 kg (200 lb)      ASSESSMENT AND PLAN:  # Obstructive CAD: Mr. Krider has obstructive CAD that we can medically managing. He continues to have chest discomfort with exertion. We will add Imdur 30 mg daily.  Continue metoprolol. Continue DAPT through 01/2018.  He was instructed that he cannot use sildenafil while on this medication.   # Chronic systolic and diastolic heart failure:  Mr. Arnott is euvolemic.  Consolidate metoprolol to succinate 25mg  daily.  Continue losartan.   # Hyperlipidemia: Statin intolerant.  Continue Zetia and check lipids in 1 month.  LDL was 94 01/2017.        Current medicines are reviewed at length with the patient today.  The patient does not have concerns regarding medicines.  The following changes have been made:  Stop sildenafil. Start Imdur 30 mg daily.   Labs/ tests ordered today include:   Orders Placed This Encounter  Procedures  . Lipid panel     Disposition:   FU with Kentrell Hallahan C. Oval Linsey, MD, [facility] in 2 months.     This note was written with the assistance of speech recognition software.  Please excuse any transcriptional  errors.  Signed, Elyn Krogh C. Oval Linsey, MD, [facility]  02/11/2017 2:15 PM    [facility]

## 2017-02-11 ENCOUNTER — Ambulatory Visit (INDEPENDENT_AMBULATORY_CARE_PROVIDER_SITE_OTHER): Payer: PPO | Admitting: Cardiovascular Disease

## 2017-02-11 ENCOUNTER — Encounter: Payer: Self-pay | Admitting: Cardiovascular Disease

## 2017-02-11 VITALS — BP 127/70 | HR 69 | Ht 71.0 in | Wt 192.4 lb

## 2017-02-11 DIAGNOSIS — I208 Other forms of angina pectoris: Secondary | ICD-10-CM | POA: Diagnosis not present

## 2017-02-11 DIAGNOSIS — I2510 Atherosclerotic heart disease of native coronary artery without angina pectoris: Secondary | ICD-10-CM | POA: Diagnosis not present

## 2017-02-11 DIAGNOSIS — E785 Hyperlipidemia, unspecified: Secondary | ICD-10-CM

## 2017-02-11 DIAGNOSIS — I10 Essential (primary) hypertension: Secondary | ICD-10-CM | POA: Diagnosis not present

## 2017-02-11 MED ORDER — METOPROLOL SUCCINATE ER 25 MG PO TB24: 25.0000 mg | ORAL_TABLET | Freq: Every day | ORAL | 3 refills | Status: DC

## 2017-02-11 MED ORDER — ISOSORBIDE MONONITRATE ER 30 MG PO TB24: 30.0000 mg | ORAL_TABLET | Freq: Every day | ORAL | 3 refills | Status: DC

## 2017-02-11 NOTE — Patient Instructions (Addendum)
Medication Instructions:  START METOPROLOL SUC 25 MG DAILY, STOP METOPROLOL TARTRATE   START ISOSORBIDE 30 MG DAILY   STOP SILDENAFIL   Labwork: FASTING LP IN 1 MONTH   Testing/Procedures: NONE  Follow-Up: Your physician recommends that you schedule a follow-up appointment in: 2 MONTH OV  If you need a refill on your cardiac medications before your next appointment, please call your pharmacy.

## 2017-03-20 ENCOUNTER — Encounter: Payer: Self-pay | Admitting: Internal Medicine

## 2017-03-20 ENCOUNTER — Ambulatory Visit (INDEPENDENT_AMBULATORY_CARE_PROVIDER_SITE_OTHER): Payer: PPO | Admitting: Internal Medicine

## 2017-03-20 DIAGNOSIS — I214 Non-ST elevation (NSTEMI) myocardial infarction: Secondary | ICD-10-CM | POA: Diagnosis not present

## 2017-03-20 DIAGNOSIS — G459 Transient cerebral ischemic attack, unspecified: Secondary | ICD-10-CM | POA: Diagnosis not present

## 2017-03-20 DIAGNOSIS — E785 Hyperlipidemia, unspecified: Secondary | ICD-10-CM | POA: Diagnosis not present

## 2017-03-20 DIAGNOSIS — I10 Essential (primary) hypertension: Secondary | ICD-10-CM | POA: Diagnosis not present

## 2017-03-20 DIAGNOSIS — I6523 Occlusion and stenosis of bilateral carotid arteries: Secondary | ICD-10-CM

## 2017-03-20 NOTE — Assessment & Plan Note (Signed)
Losartan, Toprol, Isosorbide

## 2017-03-20 NOTE — Assessment & Plan Note (Signed)
Zetia

## 2017-03-20 NOTE — Assessment & Plan Note (Signed)
   Prox RCA to Mid RCA lesion, 100 %stenosed.  Ost LM lesion, 40 %stenosed.  Ost LAD lesion, 60 %stenosed.  1st Mrg lesion, 80 %stenosed.  There is mild to moderate left ventricular systolic dysfunction.  LV end diastolic pressure is mildly elevated.  The left ventricular ejection fraction is 45-50% by visual estimate.

## 2017-03-20 NOTE — Assessment & Plan Note (Signed)
Plavix 

## 2017-03-20 NOTE — Progress Notes (Signed)
Subjective:  Patient ID: Richard Burgess, male    DOB: 1929/05/19  Age: 83 y.o. MRN: [id number]  CC: No chief complaint on file.   HPI Richard Burgess presents for CAD, HTN, dyslipidemia f/u. Not taking NTG lately   "Richard Burgess is a 83 y.o. male with hypertension, hyperlipidemia, aortic aneurysm, carotid stenosis, R subclavian steal, who presents for follow up.  Mr. Payer presented to the ED with NSTEMI 01/2017.  He underwent left heart catheterization 01/2017 and was noted to have 3v CAD (100% RCA, 40% OM, 60% ostial LAD, and 80% OM1.  LVEF was 45-50%.  He had L-->R collaterals to the LAD.  Medical management was recommended.  He was started on clopidogrel with plans for 12 months of DAPT.  He had a history of statin intolerance.  He was started on Zetia in the hospital.  LDL was 94.  Since leaving the hospital he has been feeling OK.  He notes angina with exertion.  His wife reports that he has been outside in the hot weather cutting grass and chopping wood for hours at a time.  When he does this he gets chest pain and has to lay down.  It improves within 10 minues.  There is no associated shortness of breath, nausea or diaphoresis.  He has required several tablets of nitroglycerin. He denies lower extremity edema, orthopnea or PND.  He hasn't noted any myalgias. "  Outpatient Medications Prior to Visit  Medication Sig Dispense Refill  . aspirin EC 81 MG tablet Take 81 mg by mouth daily.    . cholecalciferol (VITAMIN D) 1000 units tablet Take 1,000 Units by mouth daily.    . clopidogrel (PLAVIX) 75 MG tablet Take 1 tablet (75 mg total) by mouth daily. 30 tablet 11  . cyanocobalamin 500 MCG tablet Take 500 mcg by mouth daily. Vitamin B12    . ezetimibe (ZETIA) 10 MG tablet Take 1 tablet (10 mg total) by mouth daily. 30 tablet 11  . isosorbide mononitrate (IMDUR) 30 MG 24 hr tablet Take 1 tablet (30 mg total) by mouth daily. 90 tablet 3  . losartan (COZAAR) 25 MG tablet Take 1 tablet (25 mg  total) by mouth daily. 30 tablet 5  . metoprolol succinate (TOPROL XL) 25 MG 24 hr tablet Take 1 tablet (25 mg total) by mouth daily. 90 tablet 3  . Multiple Vitamins-Minerals (PRESERVISION AREDS 2) CAPS Take 1 capsule by mouth daily with supper.    . nitroGLYCERIN (NITROSTAT) 0.4 MG SL tablet Place 1 tablet (0.4 mg total) under the tongue every 5 (five) minutes as needed for chest pain. 20 tablet 1  . Polyethyl Glycol-Propyl Glycol (SYSTANE OP) Place 1 drop into both eyes 4 (four) times daily as needed (dry eyes).    . ranitidine (ZANTAC) 150 MG tablet Take 150 mg by mouth daily.    . traMADol (ULTRAM) 50 MG tablet Take 1 tablet (50 mg total) by mouth every 8 (eight) hours as needed. (Patient taking differently: Take 50 mg by mouth every 8 (eight) hours as needed (pain). ) 90 tablet 2  . zolpidem (AMBIEN) 5 MG tablet TAKE 1 TABLET BY MOUTH AT BEDTIME AS NEEDED FOR SLEEP 30 tablet 3   No facility-administered medications prior to visit.     ROS Review of Systems  Constitutional: Negative for appetite change, fatigue and unexpected weight change.  HENT: Positive for hearing loss. Negative for congestion, nosebleeds, sneezing, sore throat and trouble swallowing.   Eyes: Negative  for itching and visual disturbance.  Respiratory: Negative for cough.   Cardiovascular: Negative for chest pain, palpitations and leg swelling.  Gastrointestinal: Negative for abdominal distention, blood in stool, diarrhea and nausea.  Genitourinary: Negative for frequency and hematuria.  Musculoskeletal: Negative for back pain, gait problem, joint swelling and neck pain.  Skin: Negative for rash.  Neurological: Negative for dizziness, tremors, speech difficulty and weakness.  Psychiatric/Behavioral: Negative for agitation, dysphoric mood and sleep disturbance. The patient is not nervous/anxious.     Objective:  BP 124/66 (BP Location: Left Arm, Patient Position: Sitting, Cuff Size: Large)   Pulse 67   Temp 97.9  F (36.6 C) (Oral)   Ht 5\' 11"  (1.803 m)   Wt 196 lb (88.9 kg)   SpO2 99%   BMI 27.34 kg/m   BP Readings from Last 3 Encounters:  03/20/17 124/66  02/11/17 127/70  01/27/17 (!) 104/59    Wt Readings from Last 3 Encounters:  03/20/17 196 lb (88.9 kg)  02/11/17 192 lb 6.4 oz (87.3 kg)  01/25/17 197 lb 1.5 oz (89.4 kg)    Physical Exam  Constitutional: He is oriented to person, place, and time. He appears well-developed. No distress.  NAD  HENT:  Mouth/Throat: Oropharynx is clear and moist.  Eyes: Pupils are equal, round, and reactive to light. Conjunctivae are normal.  Neck: Normal range of motion. No JVD present. No thyromegaly present.  Cardiovascular: Normal rate, regular rhythm, normal heart sounds and intact distal pulses.  Exam reveals no gallop and no friction rub.   No murmur heard. Pulmonary/Chest: Effort normal and breath sounds normal. No respiratory distress. He has no wheezes. He has no rales. He exhibits no tenderness.  Abdominal: Soft. Bowel sounds are normal. He exhibits no distension and no mass. There is no tenderness. There is no rebound and no guarding.  Musculoskeletal: Normal range of motion. He exhibits no edema or tenderness.  Lymphadenopathy:    He has no cervical adenopathy.  Neurological: He is alert and oriented to person, place, and time. He has normal reflexes. No cranial nerve deficit. He exhibits normal muscle tone. He displays a negative Romberg sign. Coordination and gait normal.  Skin: Skin is warm and dry. No rash noted.  Psychiatric: He has a normal mood and affect. His behavior is normal. Judgment and thought content normal.  hard hearing  Lab Results  Component Value Date   WBC 9.3 01/27/2017   HGB 11.0 (L) 01/27/2017   HCT 33.0 (L) 01/27/2017   PLT 139 (L) 01/27/2017   GLUCOSE 147 (H) 01/27/2017   CHOL 158 01/26/2017   TRIG 148 01/26/2017   HDL 34 (L) 01/26/2017   LDLDIRECT 153.0 02/18/2016   LDLCALC 94 01/26/2017   ALT 15  02/18/2016   AST 15 02/18/2016   NA 133 (L) 01/27/2017   K 3.8 01/27/2017   CL 106 01/27/2017   CREATININE 1.08 01/27/2017   BUN 14 01/27/2017   CO2 20 (L) 01/27/2017   TSH 2.88 02/18/2016   PSA 0.28 03/23/2014   INR 1.01 01/25/2017   HGBA1C 5.9 (H) 01/25/2017   MICROALBUR 0.2 12/13/2007    Dg Chest 2 View  Result Date: 01/25/2017 CLINICAL DATA:  Non radiating chest pain while watching race tonight. Fatigue after working outside. EXAM: CHEST  2 VIEW COMPARISON:  Chest radiograph January 21, 2012 FINDINGS: Cardiomediastinal silhouette is normal. No pleural effusions or focal consolidations. Trachea projects midline and there is no pneumothorax. Soft tissue planes and included osseous structures are  non-suspicious. Surgical clips in the included right abdomen compatible with cholecystectomy. Mild degenerative change of the thoracic spine. IMPRESSION: No acute cardiopulmonary process. Electronically Signed   By: Elon Alas M.D.   On: 01/25/2017 01:05    Assessment & Plan:   There are no diagnoses linked to this encounter. I am having Mr. Hayward maintain his zolpidem, traMADol, nitroGLYCERIN, cholecalciferol, cyanocobalamin, ranitidine, aspirin EC, PRESERVISION AREDS 2, Polyethyl Glycol-Propyl Glycol (SYSTANE OP), clopidogrel, ezetimibe, losartan, metoprolol succinate, and isosorbide mononitrate.  No orders of the defined types were placed in this encounter.    Follow-up: No Follow-up on file.  [name], MD

## 2017-03-20 NOTE — Assessment & Plan Note (Signed)
Plavix Lsartan, Toprol, Isosorbideo

## 2017-03-21 LAB — LIPID PANEL
CHOLESTEROL TOTAL: 189 mg/dL (ref 100–199)
Chol/HDL Ratio: 4.8 ratio (ref 0.0–5.0)
HDL: 39 mg/dL — ABNORMAL LOW (ref >39)
LDL CALC: 108 mg/dL — AB (ref 0–99)
Triglycerides: 210 mg/dL — ABNORMAL HIGH (ref 0–149)
VLDL Cholesterol Cal: 42 mg/dL — ABNORMAL HIGH (ref 5–40)

## 2017-03-27 ENCOUNTER — Telehealth: Payer: Self-pay | Admitting: *Deleted

## 2017-03-27 NOTE — Telephone Encounter (Addendum)
-----   Message from [name], MD sent at 03/25/2017 11:01 AM EDT ----- Cholesterol levels are higher than in August and not at goal.  Refer to lipid clinic for PCSK9 inhibitor.   Left message for pt to call

## 2017-03-31 NOTE — Telephone Encounter (Signed)
Results and recommendations discussed with patient's wife (DPR), who verbalized understanding and thanks. States they would prefer to wait till appt (11/5) to discuss w Dr. Oval Linsey. Thinks patient was not fasting fully for this test.

## 2017-03-31 NOTE — Telephone Encounter (Signed)
F/u Message  Pt wife call back about lab results. Please call back to discuss

## 2017-04-09 ENCOUNTER — Other Ambulatory Visit: Payer: Self-pay | Admitting: Internal Medicine

## 2017-04-10 ENCOUNTER — Emergency Department (HOSPITAL_COMMUNITY): Payer: PPO

## 2017-04-10 ENCOUNTER — Encounter (HOSPITAL_COMMUNITY): Payer: Self-pay | Admitting: Emergency Medicine

## 2017-04-10 ENCOUNTER — Observation Stay (HOSPITAL_COMMUNITY)
Admission: EM | Admit: 2017-04-10 | Discharge: 2017-04-11 | Disposition: A | Discharge status: Home / Self Care | Payer: PPO | Attending: Cardiology | Admitting: Cardiology

## 2017-04-10 DIAGNOSIS — E785 Hyperlipidemia, unspecified: Secondary | ICD-10-CM | POA: Insufficient documentation

## 2017-04-10 DIAGNOSIS — I10 Essential (primary) hypertension: Secondary | ICD-10-CM | POA: Insufficient documentation

## 2017-04-10 DIAGNOSIS — I2510 Atherosclerotic heart disease of native coronary artery without angina pectoris: Secondary | ICD-10-CM | POA: Insufficient documentation

## 2017-04-10 DIAGNOSIS — I252 Old myocardial infarction: Secondary | ICD-10-CM | POA: Diagnosis not present

## 2017-04-10 DIAGNOSIS — R072 Precordial pain: Secondary | ICD-10-CM | POA: Diagnosis not present

## 2017-04-10 DIAGNOSIS — Z85038 Personal history of other malignant neoplasm of large intestine: Secondary | ICD-10-CM | POA: Diagnosis not present

## 2017-04-10 DIAGNOSIS — R079 Chest pain, unspecified: Secondary | ICD-10-CM | POA: Diagnosis not present

## 2017-04-10 DIAGNOSIS — Z87891 Personal history of nicotine dependence: Secondary | ICD-10-CM | POA: Insufficient documentation

## 2017-04-10 DIAGNOSIS — Z85828 Personal history of other malignant neoplasm of skin: Secondary | ICD-10-CM | POA: Diagnosis not present

## 2017-04-10 DIAGNOSIS — Z7982 Long term (current) use of aspirin: Secondary | ICD-10-CM | POA: Insufficient documentation

## 2017-04-10 DIAGNOSIS — Z7902 Long term (current) use of antithrombotics/antiplatelets: Secondary | ICD-10-CM | POA: Insufficient documentation

## 2017-04-10 DIAGNOSIS — Z79899 Other long term (current) drug therapy: Secondary | ICD-10-CM | POA: Diagnosis not present

## 2017-04-10 LAB — CBC WITH DIFFERENTIAL/PLATELET
BASOS PCT: 0 %
Basophils Absolute: 0 10*3/uL (ref 0.0–0.1)
EOS ABS: 0.3 10*3/uL (ref 0.0–0.7)
Eosinophils Relative: 6 %
HCT: 37.1 % — ABNORMAL LOW (ref 39.0–52.0)
HEMOGLOBIN: 12.4 g/dL — AB (ref 13.0–17.0)
Lymphocytes Relative: 29 %
Lymphs Abs: 1.6 10*3/uL (ref 0.7–4.0)
MCH: 28.8 pg (ref 26.0–34.0)
MCHC: 33.4 g/dL (ref 30.0–36.0)
MCV: 86.3 fL (ref 78.0–100.0)
MONOS PCT: 9 %
Monocytes Absolute: 0.5 10*3/uL (ref 0.1–1.0)
NEUTROS PCT: 56 %
Neutro Abs: 3.1 10*3/uL (ref 1.7–7.7)
Platelets: 180 10*3/uL (ref 150–400)
RBC: 4.3 MIL/uL (ref 4.22–5.81)
RDW: 15.1 % (ref 11.5–15.5)
WBC: 5.6 10*3/uL (ref 4.0–10.5)

## 2017-04-10 LAB — COMPREHENSIVE METABOLIC PANEL
ALBUMIN: 3.8 g/dL (ref 3.5–5.0)
ALK PHOS: 57 U/L (ref 38–126)
ALT: 16 U/L — ABNORMAL LOW (ref 17–63)
ANION GAP: 6 (ref 5–15)
AST: 23 U/L (ref 15–41)
BUN: 11 mg/dL (ref 6–20)
CHLORIDE: 107 mmol/L (ref 101–111)
CO2: 21 mmol/L — AB (ref 22–32)
Calcium: 9.3 mg/dL (ref 8.9–10.3)
Creatinine, Ser: 1.1 mg/dL (ref 0.61–1.24)
GFR calc Af Amer: >60 mL/min (ref >60)
GFR calc non Af Amer: 59 mL/min — ABNORMAL LOW (ref >60)
GLUCOSE: 211 mg/dL — AB (ref 65–99)
POTASSIUM: 4.4 mmol/L (ref 3.5–5.1)
SODIUM: 134 mmol/L — AB (ref 135–145)
Total Bilirubin: 0.9 mg/dL (ref 0.3–1.2)
Total Protein: 6.5 g/dL (ref 6.5–8.1)

## 2017-04-10 LAB — I-STAT TROPONIN, ED
TROPONIN I, POC: 0.01 ng/mL (ref 0.00–0.08)
Troponin i, poc: 0.01 ng/mL (ref 0.00–0.08)

## 2017-04-10 LAB — PROTIME-INR
INR: 0.97
PROTHROMBIN TIME: 12.8 s (ref 11.4–15.2)

## 2017-04-10 LAB — BRAIN NATRIURETIC PEPTIDE: B Natriuretic Peptide: 94.9 pg/mL (ref 0.0–100.0)

## 2017-04-10 LAB — LIPASE, BLOOD: Lipase: 20 U/L (ref 11–51)

## 2017-04-10 LAB — TROPONIN I

## 2017-04-10 MED ORDER — EZETIMIBE 10 MG PO TABS
10.0000 mg | ORAL_TABLET | Freq: Every day | ORAL | Status: DC
  Administered 2017-04-11: 10 mg via ORAL
  Filled 2017-04-10: qty 1

## 2017-04-10 MED ORDER — ASPIRIN EC 81 MG PO TBEC
81.0000 mg | DELAYED_RELEASE_TABLET | Freq: Every day | ORAL | Status: DC
  Administered 2017-04-11: 81 mg via ORAL
  Filled 2017-04-10: qty 1

## 2017-04-10 MED ORDER — ONDANSETRON HCL 4 MG/2ML IJ SOLN: 4.0000 mg | Freq: Four times a day (QID) | INTRAMUSCULAR | Status: DC | PRN

## 2017-04-10 MED ORDER — TRAMADOL HCL 50 MG PO TABS: 50.0000 mg | ORAL_TABLET | Freq: Three times a day (TID) | ORAL | Status: DC | PRN

## 2017-04-10 MED ORDER — ALPRAZOLAM 0.25 MG PO TABS
0.2500 mg | ORAL_TABLET | Freq: Every evening | ORAL | Status: DC | PRN
  Administered 2017-04-10: 0.25 mg via ORAL
  Filled 2017-04-10: qty 1

## 2017-04-10 MED ORDER — NITROGLYCERIN 0.4 MG SL SUBL: 0.4000 mg | SUBLINGUAL_TABLET | SUBLINGUAL | Status: DC | PRN

## 2017-04-10 MED ORDER — LOSARTAN POTASSIUM 25 MG PO TABS
25.0000 mg | ORAL_TABLET | Freq: Every day | ORAL | Status: DC
  Administered 2017-04-11: 25 mg via ORAL
  Filled 2017-04-10: qty 1

## 2017-04-10 MED ORDER — ACETAMINOPHEN 325 MG PO TABS: 650.0000 mg | ORAL_TABLET | ORAL | Status: DC | PRN

## 2017-04-10 MED ORDER — CYANOCOBALAMIN 500 MCG PO TABS
500.0000 ug | ORAL_TABLET | Freq: Every day | ORAL | Status: DC
  Filled 2017-04-10: qty 1

## 2017-04-10 MED ORDER — ISOSORBIDE MONONITRATE ER 60 MG PO TB24
120.0000 mg | ORAL_TABLET | Freq: Every day | ORAL | Status: DC
  Administered 2017-04-10: 120 mg via ORAL
  Filled 2017-04-10 (×2): qty 2

## 2017-04-10 MED ORDER — METOPROLOL SUCCINATE ER 25 MG PO TB24
25.0000 mg | ORAL_TABLET | Freq: Every day | ORAL | Status: DC
  Filled 2017-04-10: qty 1

## 2017-04-10 MED ORDER — VITAMIN D 1000 UNITS PO TABS
1000.0000 [IU] | ORAL_TABLET | Freq: Every day | ORAL | Status: DC
  Administered 2017-04-11: 1000 [IU] via ORAL
  Filled 2017-04-10: qty 1

## 2017-04-10 MED ORDER — ENOXAPARIN SODIUM 40 MG/0.4ML ~~LOC~~ SOLN
40.0000 mg | SUBCUTANEOUS | Status: DC
  Administered 2017-04-10: 40 mg via SUBCUTANEOUS
  Filled 2017-04-10: qty 0.4

## 2017-04-10 MED ORDER — POLYVINYL ALCOHOL 1.4 % OP SOLN
Freq: Four times a day (QID) | OPHTHALMIC | Status: DC | PRN
  Filled 2017-04-10: qty 15

## 2017-04-10 MED ORDER — FAMOTIDINE 20 MG PO TABS
20.0000 mg | ORAL_TABLET | Freq: Every day | ORAL | Status: DC | PRN
  Administered 2017-04-10: 20 mg via ORAL
  Filled 2017-04-10: qty 1

## 2017-04-10 MED ORDER — CLOPIDOGREL BISULFATE 75 MG PO TABS
75.0000 mg | ORAL_TABLET | Freq: Every day | ORAL | Status: DC
  Administered 2017-04-11: 75 mg via ORAL
  Filled 2017-04-10: qty 1
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
## 2017-04-10 NOTE — Progress Notes (Signed)
Pt complained of indigestion.  Pt denies chest pain and says the discomfort passed when he released gas.  Pt vitals and full assessment completed.  No further concerns at this time.  Pt given indigestion medication and pt agreed to let RN know immediately if he has any further pain.  Will continue to monitor for safety.

## 2017-04-10 NOTE — Plan of Care (Signed)
Problem: Safety: Goal: Ability to remain free from injury will improve Outcome: Progressing Safety precautions reviewed with patient.  No injuries or issues to report at this time.  Will continue to monitor for safety.

## 2017-04-10 NOTE — H&P (Signed)
Cardiology Consultation:   Patient ID: BRENTIN SHIN; [id number]; [birth year]   Admit date: 04/10/2017 Date of Consult: 04/10/2017  Primary Care Provider: Cassandria Anger, MD Primary Cardiologist: [name], MD  Primary Electrophysiologist:  n/a  Patient Profile:   Richard Burgess is a 83yo male with a hx of CAD (complete occlusion of RCA, 80% OM1 not ammenable to PCI, and moderate LM and LAD disease), HTN, HLD, TIA, PVD who is being seen today for the evaluation of chest pain at the request of Dr. Sherry Ruffing.  History of Present Illness:   Richard Burgess is an 83yo male presenting with substernal chest pain that is pressure/squeezing like in quality which began at 1am early this morning at rest. Patient denies associated nausea, vomiting, diaphoresis, dizziness, syncope, shortness of breath or radiation of pain to neck, left arm or back. Patient was awake all night with the pain, then this morning thought he could walk it off and worked in his yard including mowing his lawn; when the pain persisted, he decided to seek evaluation. Patient also reports intermittent chest pain in the last couple of days associated with exertion and relieved by rest; he has been splitting wood for hours at a time and would intermittently take breaks when the pain presented. He did not have any associated symptoms with these episodes either.   Patient was administered 324mg  ASA and sl nitro by EMS on arrival to his residence which resolved his chest pain; he has been chest pain free since arrival to the ED.  Patient was admitted in August 2018 with chest pain and underwent LHC which showed complete occlusion of RCA, nonammenable OM1 occlusion, and mod LM and LAD disease; no stents were placed at that time and he was managed medically. He was started on plavix in addition to ASA for planned 75mo period of DAPT.  Patient endorses compliance with asa, plavix, metoprolol, losartan and zetia - he reports taking  these this morning. He denies gum bleeding, hematuria, melena or hematochezia.  Past Medical History:  Diagnosis Date  . Allergy   . BPH (benign prostatic hypertrophy)   . Carotid artery occlusion   . Diverticulosis of colon   . ED (erectile dysfunction)   . GERD (gastroesophageal reflux disease)   . Glucose intolerance (impaired glucose tolerance)   . History of pancreatitis   . Hx-TIA (transient ischemic attack)   . Hyperlipidemia   . Hypertension   . Hypertention, malignant, with acute intensive management   . Low back pain   . Osteoarthritis   . PVD (peripheral vascular disease) ([place])    Bilateral carotid  . Tubular adenoma of colon 08/1991   One with carcinoma IN [place] 1993, multiple adenomatous   Past Surgical History:  Procedure Laterality Date  . APPENDECTOMY    . BASAL CELL CARCINOMA EXCISION    . CHOLECYSTECTOMY    . COLONOSCOPY    . ESOPHAGOGASTRODUODENOSCOPY    . LEFT HEART CATH AND CORONARY ANGIOGRAPHY N/A 01/26/2017   Procedure: LEFT HEART CATH AND CORONARY ANGIOGRAPHY;  Surgeon: Lorretta Harp, MD;  Location: [facility];  Service: Cardiovascular;  Laterality: N/A;  . LUMBAR SPINE SURGERY     X4   Inpatient Medications: Scheduled Meds:  Continuous Infusions:  PRN Meds:  Allergies:    Allergies  Allergen Reactions  . Amlodipine Other (See Comments)    Weakness in legs  . Losartan Other (See Comments)    Leg numbness/stiffness.   . Oxycodone Other (See  Comments)    crazy  . Pravastatin Sodium Other (See Comments)    REACTION: bad dreams  . Rosuvastatin Other (See Comments)    REACTION: palpitations  . Statins Other (See Comments)    REACTION: aches   Social History:   Social History   Social History  . Marital status: Married    Spouse name: N/A  . Number of children: 3  . Years of education: N/A   Occupational History  . Retired-Construction    Social History Main Topics  . Smoking status: Former Smoker    Years: 20.00     Types: Cigarettes  . Smokeless tobacco: Never Used     Comment: Daily caffeine use, regular exercise  . Alcohol use 4.2 - 8.4 oz/week    7 - 14 Standard drinks or equivalent per week     Comment: occ beer  . Drug use: No  . Sexual activity: Yes   Other Topics Concern  . Not on file   Social History Narrative  . No narrative on file    Family History:    Family History  Problem Relation Age of Onset  . Dementia Father   . Diabetes Mother   . Peripheral vascular disease Brother   . Colon cancer Sister        dx in her 12's   ROS:  Please see the history of present illness.   All other ROS reviewed and negative.     Vitals:   04/10/17 1400 04/10/17 1415 04/10/17 1430 04/10/17 1500  BP: (!) 144/74 133/74 125/74 134/74  Pulse: (!) 59 (!) 58 (!) 57 60  Resp: 12 19 19 16   Temp:      TempSrc:      SpO2: 96% 97% 97% 99%  Weight:      Height:       No intake or output data in the 24 hours ending 04/10/17 1424  Filed Weights   04/10/17 1242  Weight: 190 lb (86.2 kg)    Body mass index is 26.5 kg/m.  General:  Well nourished, well developed, in no acute distress HEENT: normal Lymph: no adenopathy Neck: no JVD Endocrine:  No thryomegaly Vascular: Right carotid bruit; PT pulses 2+ bilaterally Cardiac:  normal S1, S2; RRR; no murmurs, rubs or gallops appreciated. Lungs:  clear to auscultation bilaterally, no wheezing, rhonchi or rales  Abd: soft, nontender, no hepatomegaly  Ext: no edema in bil LEs Musculoskeletal:  No deformities, moving all extremities freely Skin: warm and dry  Neuro:  CNs 2-12 grossly intact, no focal abnormalities noted Psych:  Normal affect   EKG:  The EKG was personally reviewed and demonstrates:  Sinus rhythm, q waves in lead III and aVF, TWI in III and aVF compared to priors, no ST elevation or depression.  Relevant CV Studies: LHC 01/26/17:  Prox RCA to Mid RCA lesion, 100 %stenosed.  Ost LM lesion, 40 %stenosed.  Ost LAD lesion,  60 %stenosed.  1st Mrg lesion, 80 %stenosed.  There is mild to moderate left ventricular systolic dysfunction.  LV end diastolic pressure is mildly elevated.  The left ventricular ejection fraction is 45-50% by visual estimate  Laboratory Data:  Chemistry BMET    Component Value Date/Time   NA 134 (L) 04/10/2017 1300   K 4.4 04/10/2017 1300   CL 107 04/10/2017 1300   CO2 21 (L) 04/10/2017 1300   GLUCOSE 211 (H) 04/10/2017 1300   BUN 11 04/10/2017 1300   CREATININE 1.10 04/10/2017 1300  CALCIUM 9.3 04/10/2017 1300   GFRNONAA 59 (L) 04/10/2017 1300   GFRAA >60 04/10/2017 1300    Hematology CBC    Component Value Date/Time   WBC 5.6 04/10/2017 1300   RBC 4.30 04/10/2017 1300   HGB 12.4 (L) 04/10/2017 1300   HCT 37.1 (L) 04/10/2017 1300   PLT 180 04/10/2017 1300   MCV 86.3 04/10/2017 1300   MCH 28.8 04/10/2017 1300   MCHC 33.4 04/10/2017 1300   RDW 15.1 04/10/2017 1300   LYMPHSABS 1.6 04/10/2017 1300   MONOABS 0.5 04/10/2017 1300   EOSABS 0.3 04/10/2017 1300   BASOSABS 0.0 04/10/2017 1300    Cardiac Enzymes  Recent Labs  04/10/17 1343  TROPIPOC 0.01   BNP    Component Value Date/Time   BNP 94.9 04/10/2017 1301   ProBNP    Component Value Date/Time   PROBNP 29.0 06/01/2008 1147   DDimer n/a  Radiology/Studies:  Dg Chest 2 View  Result Date: 04/10/2017 CLINICAL DATA:  Chest pain. EXAM: CHEST  2 VIEW COMPARISON:  Chest x-ray dated January 25, 2017. FINDINGS: Lordotic positioning. The cardiomediastinal silhouette is normal in size. Normal pulmonary vascularity. No focal consolidation, pleural effusion, or pneumothorax. No acute osseous abnormality. IMPRESSION: No active cardiopulmonary disease. Electronically Signed   By: Titus Dubin M.D.   On: 04/10/2017 13:58    Assessment and Plan:   1. Chest pain: Patient with 2 days of symptoms consistent with stable angina and episode of chest pain concerning for unstable angina which was relieved by nitro.  EKG shows new q waves in III, aVF along with new TWI in same leads. Initial POC troponin is wnl. Grace score 135, Heart score 6, TIMI score 4. He received 324mg  ASA. Patient is an otherwise very independent 83yo. Will admit to observation with serial troponins and repeat EKG. If troponins positive, would consider repeat cath for possible PCI for symptom control. Will increase Imdur to 120mg  daily. Further antianginal therapy can be considered if not controlled with max dose Imdur 240, such as stopping losartan and adding amlodipine based on his BP response.  2. HTN: stable on low dose metoprolol and losartan - metoprolol titration limited by HR;  3. HLD: patient with history of statin induced myopathy; he was recently started on zetia and has been tolerating well.   For questions or updates, please contact [place] Please consult [email or web address] for contact info under Cardiology/STEMI.   Signed, Alphonzo Grieve, MD IMTS - PGY2 04/10/2017   History and all data above reviewed.  Patient examined.  I agree with the findings as above.  The patient is very active.  He might have had some chest pain yesterday while cutting wood.  However, the discomfort that prompted the ED visit was early this morning while at rest.  He did take NTG and had some improvement with this.  He has one set of negative POC markers.  However, he did have some new T wave inversion in the inferior lead.  (He has a known occluded RCA).   The patient exam reveals COR:RRR  ,  Lungs: Clear  ,  Abd: Positive bowel sounds, no rebound no guarding, Ext No edema  .  All available labs, radiology testing, previous records reviewed. Agree with documented assessment and plan.  Chest pain:  Somewhat atypical.  However, known CAD (I reviewed the cath images personally.) Some non specific EKG changes.  I will increase the Imdur.  If enzymes remain negative and he has no further pain I  would send him home with medical management in the AM.  He could  have PCI of the OM if he has continued symptoms or enzyme changes.    Jeneen Rinks Drayke Grabel  3:53 PM  04/10/2017

## 2017-04-10 NOTE — ED Triage Notes (Signed)
Per [facility] patient coming from home complaining of chest pain since 1am. Patient took 1 nitro this morning with relief. Patient reports nausea. Ems administered 1 nitro, 324 mg aspirin, and 4 mg zofran. Patient pain free on arrival. Patient reports occlusion "in lower heart". Reports taking blood thinners for it. Appointment scheduled for Monday with [place] cardiology

## 2017-04-10 NOTE — Consult Note (Deleted)
Cardiology Consultation:   Patient ID: HAU SANOR; [id number]; 1928/09/27   Admit date: 04/10/2017 Date of Consult: 04/10/2017  Primary Care Provider: Cassandria Anger, MD Primary Cardiologist: [name], MD  Primary Electrophysiologist:  n/a   Patient Profile:   Richard Burgess is a 83yo male with a hx of CAD (complete occlusion of RCA, 80% OM1 not ammenable to PCI, and moderate LM and LAD disease), HTN, HLD, TIA, PVD who is being seen today for the evaluation of chest pain at the request of Dr. Sherry Ruffing.  History of Present Illness:   Richard Burgess is an 83yo male presenting with substernal chest pain that is pressure/squeezing like in quality which began at 1am early this morning at rest. Patient denies associated nausea, vomiting, diaphoresis, dizziness, syncope, shortness of breath or radiation of pain to neck, left arm or back. Patient was awake all night with the pain, then this morning thought he could walk it off and worked in his yard including mowing his lawn; when the pain persisted, he decided to seek evaluation. Patient also reports intermittent chest pain in the last couple of days associated with exertion and relieved by rest; he has been splitting wood for hours at a time and would intermittently take breaks when the pain presented. He did not have any associated symptoms with these episodes either.   Patient was administered 324mg  ASA and sl nitro by EMS on arrival to his residence which resolved his chest pain; he has been chest pain free since arrival to the ED.  Patient was admitted in August 2018 with chest pain and underwent LHC which showed complete occlusion of RCA, nonammenable OM1 occlusion, and mod LM and LAD disease; no stents were placed at that time and he was managed medically. He was started on plavix in addition to ASA for planned 61mo period of DAPT.  Patient endorses compliance with asa, plavix, metoprolol, losartan and zetia - he reports taking  these this morning. He denies gum bleeding, hematuria, melena or hematochezia.  Past Medical History:  Diagnosis Date  . Allergy   . BPH (benign prostatic hypertrophy)   . Carotid artery occlusion   . Diverticulosis of colon   . ED (erectile dysfunction)   . GERD (gastroesophageal reflux disease)   . Glucose intolerance (impaired glucose tolerance)   . History of pancreatitis   . Hx-TIA (transient ischemic attack)   . Hyperlipidemia   . Hypertension   . Hypertention, malignant, with acute intensive management   . Low back pain   . Osteoarthritis   . PVD (peripheral vascular disease) ([place])    Bilateral carotid  . Tubular adenoma of colon 08/1991   One with carcinoma IN [place] 1993, multiple adenomatous   Past Surgical History:  Procedure Laterality Date  . APPENDECTOMY    . BASAL CELL CARCINOMA EXCISION    . CHOLECYSTECTOMY    . COLONOSCOPY    . ESOPHAGOGASTRODUODENOSCOPY    . LEFT HEART CATH AND CORONARY ANGIOGRAPHY N/A 01/26/2017   Procedure: LEFT HEART CATH AND CORONARY ANGIOGRAPHY;  Surgeon: Lorretta Harp, MD;  Location: [facility];  Service: Cardiovascular;  Laterality: N/A;  . LUMBAR SPINE SURGERY     X4   Inpatient Medications: Scheduled Meds:  Continuous Infusions:  PRN Meds:  Allergies:    Allergies  Allergen Reactions  . Amlodipine Other (See Comments)    Weakness in legs  . Losartan Other (See Comments)    Leg numbness/stiffness.   . Oxycodone Other (  See Comments)    crazy  . Pravastatin Sodium Other (See Comments)    REACTION: bad dreams  . Rosuvastatin Other (See Comments)    REACTION: palpitations  . Statins Other (See Comments)    REACTION: aches   Social History:   Social History   Social History  . Marital status: Married    Spouse name: N/A  . Number of children: 3  . Years of education: N/A   Occupational History  . Retired-Construction    Social History Main Topics  . Smoking status: Former Smoker    Years: 20.00     Types: Cigarettes  . Smokeless tobacco: Never Used     Comment: Daily caffeine use, regular exercise  . Alcohol use 4.2 - 8.4 oz/week    7 - 14 Standard drinks or equivalent per week     Comment: occ beer  . Drug use: No  . Sexual activity: Yes   Other Topics Concern  . Not on file   Social History Narrative  . No narrative on file    Family History:    Family History  Problem Relation Age of Onset  . Dementia Father   . Diabetes Mother   . Peripheral vascular disease Brother   . Colon cancer Sister        dx in her 89's   ROS:  Please see the history of present illness.   All other ROS reviewed and negative.     Vitals:   04/10/17 1315 04/10/17 1330 04/10/17 1400 04/10/17 1415  BP: 118/68 124/68 (!) 144/74 133/74  Pulse: (!) 57 60 (!) 59 (!) 58  Resp: 12 15 12 19   Temp:      TempSrc:      SpO2: 95% 93% 96% 97%  Weight:      Height:       No intake or output data in the 24 hours ending 04/10/17 1424 Filed Weights   04/10/17 1242  Weight: 190 lb (86.2 kg)   Body mass index is 26.5 kg/m.  General:  Well nourished, well developed, in no acute distress HEENT: normal Lymph: no adenopathy Neck: no JVD Endocrine:  No thryomegaly Vascular: Right carotid bruit; PT pulses 2+ bilaterally Cardiac:  normal S1, S2; RRR; no murmurs, rubs or gallops appreciated. Lungs:  clear to auscultation bilaterally, no wheezing, rhonchi or rales  Abd: soft, nontender, no hepatomegaly  Ext: no edema in bil LEs Musculoskeletal:  No deformities, moving all extremities freely Skin: warm and dry  Neuro:  CNs 2-12 grossly intact, no focal abnormalities noted Psych:  Normal affect   EKG:  The EKG was personally reviewed and demonstrates:  Sinus rhythm, q waves in lead III and aVF, TWI in III and aVF compared to priors, no ST elevation or depression.  Relevant CV Studies: LHC 01/26/17:  Prox RCA to Mid RCA lesion, 100 %stenosed.  Ost LM lesion, 40 %stenosed.  Ost LAD lesion, 60  %stenosed.  1st Mrg lesion, 80 %stenosed.  There is mild to moderate left ventricular systolic dysfunction.  LV end diastolic pressure is mildly elevated.  The left ventricular ejection fraction is 45-50% by visual estimate  Laboratory Data:  Chemistry BMET    Component Value Date/Time   NA 134 (L) 04/10/2017 1300   K 4.4 04/10/2017 1300   CL 107 04/10/2017 1300   CO2 21 (L) 04/10/2017 1300   GLUCOSE 211 (H) 04/10/2017 1300   BUN 11 04/10/2017 1300   CREATININE 1.10 04/10/2017 1300  CALCIUM 9.3 04/10/2017 1300   GFRNONAA 59 (L) 04/10/2017 1300   GFRAA >60 04/10/2017 1300   Hematology CBC    Component Value Date/Time   WBC 5.6 04/10/2017 1300   RBC 4.30 04/10/2017 1300   HGB 12.4 (L) 04/10/2017 1300   HCT 37.1 (L) 04/10/2017 1300   PLT 180 04/10/2017 1300   MCV 86.3 04/10/2017 1300   MCH 28.8 04/10/2017 1300   MCHC 33.4 04/10/2017 1300   RDW 15.1 04/10/2017 1300   LYMPHSABS 1.6 04/10/2017 1300   MONOABS 0.5 04/10/2017 1300   EOSABS 0.3 04/10/2017 1300   BASOSABS 0.0 04/10/2017 1300   Cardiac Enzymes Troponin (Point of Care Test)  Recent Labs  04/10/17 1343  TROPIPOC 0.01   BNP     Component Value Date/Time   BNP 94.9 04/10/2017 1301   ProBNP    Component Value Date/Time   PROBNP 29.0 06/01/2008 1147   DDimer n/a  Radiology/Studies:  Dg Chest 2 View  Result Date: 04/10/2017 CLINICAL DATA:  Chest pain. EXAM: CHEST  2 VIEW COMPARISON:  Chest x-ray dated January 25, 2017. FINDINGS: Lordotic positioning. The cardiomediastinal silhouette is normal in size. Normal pulmonary vascularity. No focal consolidation, pleural effusion, or pneumothorax. No acute osseous abnormality. IMPRESSION: No active cardiopulmonary disease. Electronically Signed   By: Titus Dubin M.D.   On: 04/10/2017 13:58    Assessment and Plan:   1. Chest pain: Patient with 2 days of symptoms consistent with stable angina and episode of chest pain concerning for unstable angina  which was relieved by nitro. EKG shows new q waves in III, aVF along with new TWI in same leads. Initial POC troponin is wnl. Grace score 135, Heart score 6, TIMI score 4. He received 324mg  ASA. Patient is an otherwise very independent 83yo and would benefit from a repeat cath. 2. HTN: stable on low dose metoprolol and losartan - metoprolol titration limited by HR. 3. HLD: patient with history of statin induced myopathy; he was recently started on zetia and has been tolerating well.    For questions or updates, please contact [place] Please consult [email or web address] for contact info under Cardiology/STEMI.   Signed, Alphonzo Grieve, MD IMTS - PGY2 04/10/2017

## 2017-04-11 DIAGNOSIS — Z87891 Personal history of nicotine dependence: Secondary | ICD-10-CM | POA: Diagnosis not present

## 2017-04-11 DIAGNOSIS — Z85038 Personal history of other malignant neoplasm of large intestine: Secondary | ICD-10-CM | POA: Diagnosis not present

## 2017-04-11 DIAGNOSIS — Z7982 Long term (current) use of aspirin: Secondary | ICD-10-CM | POA: Diagnosis not present

## 2017-04-11 DIAGNOSIS — I252 Old myocardial infarction: Secondary | ICD-10-CM | POA: Diagnosis not present

## 2017-04-11 DIAGNOSIS — Z79899 Other long term (current) drug therapy: Secondary | ICD-10-CM | POA: Diagnosis not present

## 2017-04-11 DIAGNOSIS — R141 Gas pain: Secondary | ICD-10-CM | POA: Diagnosis not present

## 2017-04-11 DIAGNOSIS — R079 Chest pain, unspecified: Secondary | ICD-10-CM | POA: Diagnosis not present

## 2017-04-11 DIAGNOSIS — I2510 Atherosclerotic heart disease of native coronary artery without angina pectoris: Secondary | ICD-10-CM | POA: Diagnosis not present

## 2017-04-11 DIAGNOSIS — Z85828 Personal history of other malignant neoplasm of skin: Secondary | ICD-10-CM | POA: Diagnosis not present

## 2017-04-11 DIAGNOSIS — Z7902 Long term (current) use of antithrombotics/antiplatelets: Secondary | ICD-10-CM | POA: Diagnosis not present

## 2017-04-11 DIAGNOSIS — E785 Hyperlipidemia, unspecified: Secondary | ICD-10-CM | POA: Diagnosis not present

## 2017-04-11 DIAGNOSIS — I10 Essential (primary) hypertension: Secondary | ICD-10-CM | POA: Diagnosis not present

## 2017-04-11 LAB — TROPONIN I: Troponin I: <0.03 ng/mL (ref <0.03)

## 2017-04-11 MED ORDER — ISOSORBIDE MONONITRATE ER 120 MG PO TB24
120.0000 mg | ORAL_TABLET | Freq: Every day | ORAL | 2 refills | Status: DC
Start: 2017-04-11 — End: 2017-05-06

## 2017-04-11 NOTE — Progress Notes (Signed)
Discharge summary reviewed with pt and daughter at bedside. All questions answered and pt expresses no concerns at this time. No change since previous assessment. PIV removed. Pt discharged to lobby via wheelchair and NT.

## 2017-04-11 NOTE — Discharge Summary (Signed)
Discharge Summary    Patient ID: Richard Burgess,  MRN: [id number], DOB/AGE: [birth year] 83 y.o.  Admit date: 04/10/2017 Discharge date: 04/11/2017  Primary Care Provider: Cassandria Anger Primary Cardiologist: Oval Linsey   Discharge Diagnoses    Active Problems:   Chest pain   Allergies Allergies  Allergen Reactions  . Amlodipine Other (See Comments)    Weakness in legs  . Losartan Other (See Comments)    Leg numbness/stiffness.   . Oxycodone Other (See Comments)    crazy  . Pravastatin Sodium Other (See Comments)    REACTION: bad dreams  . Rosuvastatin Palpitations  . Statins Palpitations and Other (See Comments)    REACTION: aches, bad dreams    Diagnostic Studies/Procedures    n/a _____________   History of Present Illness     Richard Burgess is a 83yo male with a hx of CAD (complete occlusion of RCA, 80% OM1 not ammenable to PCI, and moderate LM and LAD disease), HTN, HLD, TIA, PVD who presented with chest pain. Mr. Gentzler presented with substernal chest pain that was pressure/squeezing like in quality which began at 1am early this morning at rest. Patient denied associated nausea, vomiting, diaphoresis, dizziness, syncope, shortness of breath or radiation of pain to neck, left arm or back. Patient was awake all night with the pain, then the morning of admission, thought he could walk it off and worked in his yard including mowing his lawn; when the pain persisted, he decided to seek evaluation. Patient also reported intermittent chest pain in the last couple of days associated with exertion and relieved by rest; he has been splitting wood for hours at a time and would intermittently take breaks when the pain presented. He did not have any associated symptoms with these episodes either.   Patient was administered 324mg  ASA and sl nitro by EMS on arrival to his residence which resolved his chest pain; he had been chest pain free since arrival to the ED.  Patient  was admitted in August 2018 with chest pain and underwent LHC which showed complete occlusion of RCA, nonammenable OM1 occlusion, and mod LM and LAD disease; no stents were placed at that time and he was managed medically. He was started on plavix in addition to ASA for planned 81mo period of DAPT. He was admitted for rule out.   Hospital Course     Troponins cycled and negative x5. Imdur was increased to 120mg  daily. Reported increased GERD likely symptoms throughout the night that improved with flatulence. No further chest pain.   Richard Burgess was seen by Dr. Debara Pickett and determined stable for discharge home. Follow up in the office has been arranged. Medications are listed below.   _____________  Discharge Vitals Blood pressure 118/64, pulse 60, temperature 98 F (36.7 C), temperature source Oral, resp. rate 16, height 5' 10.5" (1.791 m), weight 193 lb 4.8 oz (87.7 kg), SpO2 97 %.  Filed Weights   04/10/17 1242 04/10/17 1741 04/11/17 0642  Weight: 190 lb (86.2 kg) 193 lb 6.4 oz (87.7 kg) 193 lb 4.8 oz (87.7 kg)    Labs & Radiologic Studies    CBC  Recent Labs  04/10/17 1300  WBC 5.6  NEUTROABS 3.1  HGB 12.4*  HCT 37.1*  MCV 86.3  PLT 299   Basic Metabolic Panel  Recent Labs  04/10/17 1300  NA 134*  K 4.4  CL 107  CO2 21*  GLUCOSE 211*  BUN 11  CREATININE 1.10  CALCIUM 9.3   Liver Function Tests  Recent Labs  04/10/17 1300  AST 23  ALT 16*  ALKPHOS 57  BILITOT 0.9  PROT 6.5  ALBUMIN 3.8    Recent Labs  04/10/17 1300  LIPASE 20   Cardiac Enzymes  Recent Labs  04/10/17 1607 04/10/17 2156 04/11/17 0356  TROPONINI <0.03 <0.03 <0.03   BNP Invalid input(s): POCBNP D-Dimer No results for input(s): DDIMER in the last 72 hours. Hemoglobin A1C No results for input(s): HGBA1C in the last 72 hours. Fasting Lipid Panel No results for input(s): CHOL, HDL, LDLCALC, TRIG, CHOLHDL, LDLDIRECT in the last 72 hours. Thyroid Function Tests No results  for input(s): TSH, T4TOTAL, T3FREE, THYROIDAB in the last 72 hours.  Invalid input(s): FREET3 _____________  Dg Chest 2 View  Result Date: 04/10/2017 CLINICAL DATA:  Chest pain. EXAM: CHEST  2 VIEW COMPARISON:  Chest x-ray dated January 25, 2017. FINDINGS: Lordotic positioning. The cardiomediastinal silhouette is normal in size. Normal pulmonary vascularity. No focal consolidation, pleural effusion, or pneumothorax. No acute osseous abnormality. IMPRESSION: No active cardiopulmonary disease. Electronically Signed   By: Titus Dubin M.D.   On: 04/10/2017 13:58   Disposition   Pt is being discharged home today in good condition.  Follow-up Plans & Appointments     Discharge Instructions    Diet - low sodium heart healthy    Complete by:  As directed    Discharge instructions    Complete by:  As directed    Please monitor your blood pressure at home as we have increase some of your home medication called Imdur.   Increase activity slowly    Complete by:  As directed       Discharge Medications     Medication List    TAKE these medications   aspirin EC 81 MG tablet Take 81 mg by mouth daily.   cholecalciferol 1000 units tablet Commonly known as:  VITAMIN D Take 1,000 Units by mouth daily.   clopidogrel 75 MG tablet Commonly known as:  PLAVIX Take 1 tablet (75 mg total) by mouth daily.   cyanocobalamin 500 MCG tablet Take 500 mcg by mouth daily. Vitamin B12   ezetimibe 10 MG tablet Commonly known as:  ZETIA Take 1 tablet (10 mg total) by mouth daily.   isosorbide mononitrate 120 MG 24 hr tablet Commonly known as:  IMDUR Take 1 tablet (120 mg total) by mouth daily. What changed:  medication strength  how much to take   losartan 25 MG tablet Commonly known as:  COZAAR Take 1 tablet (25 mg total) by mouth daily.   metoprolol succinate 25 MG 24 hr tablet Commonly known as:  TOPROL XL Take 1 tablet (25 mg total) by mouth daily.   nitroGLYCERIN 0.4 MG SL  tablet Commonly known as:  NITROSTAT Place 1 tablet (0.4 mg total) under the tongue every 5 (five) minutes as needed for chest pain.   PRESERVISION AREDS 2 Caps Take 1 capsule by mouth daily with supper.   ranitidine 150 MG tablet Commonly known as:  ZANTAC Take 150 mg by mouth daily.   SYSTANE OP Place 1 drop into both eyes 4 (four) times daily as needed (dry eyes).   traMADol 50 MG tablet Commonly known as:  ULTRAM Take 1 tablet (50 mg total) by mouth every 8 (eight) hours as needed. What changed:  reasons to take this   zolpidem 5 MG tablet Commonly known as:  AMBIEN TAKE 1 TABLET BY MOUTH AT  BEDTIME AS NEEDED FOR SLEEP        Outstanding Labs/Studies   N/a  Duration of Discharge Encounter   Greater than 30 minutes including physician time.  Signed, Reino Bellis NP-C 04/11/2017, 12:02 PM

## 2017-04-11 NOTE — Progress Notes (Signed)
DAILY PROGRESS NOTE   Patient Name: Richard Burgess Date of Encounter: 04/11/2017  Chief Complaint   No chest pain  Patient Profile   ANTAEUS KAREL is a 83yo male with a hx of CAD (complete occlusion of RCA, 80% OM1 not ammenable to PCI, and moderate LM and LAD disease), HTN, HLD, TIA, PVD who is being seen today for the evaluation of chest pain at the request of Dr. Sherry Ruffing.  Subjective   No chest pain overnight.  Patient reported that his discomfort improved with flatulence.  Troponin has been negative x5.  BNP is low at 94.  Labs otherwise unremarkable.   Objective   Vitals:   04/11/17 0642 04/11/17 0735 04/11/17 1055 04/11/17 1111  BP: 103/63 (!) 111/50 99/62 118/64  Pulse: 61 60    Resp: 12 16    Temp: 97.8 F (36.6 C) 98 F (36.7 C)    TempSrc: Oral Oral    SpO2: 98% 97%    Weight: 193 lb 4.8 oz (87.7 kg)     Height:        Intake/Output Summary (Last 24 hours) at 04/11/17 1126 Last data filed at 04/11/17 0800  Gross per 24 hour  Intake              240 ml  Output              300 ml  Net              -60 ml   Filed Weights   04/10/17 1242 04/10/17 1741 04/11/17 0642  Weight: 190 lb (86.2 kg) 193 lb 6.4 oz (87.7 kg) 193 lb 4.8 oz (87.7 kg)    Physical Exam   General appearance: alert and no distress Neck: no carotid bruit, no JVD and thyroid not enlarged, symmetric, no tenderness/mass/nodules Lungs: clear to auscultation bilaterally Heart: regular rate and rhythm Abdomen: soft, non-tender; bowel sounds normal; no masses,  no organomegaly Extremities: extremities normal, atraumatic, no cyanosis or edema Pulses: 2+ and symmetric Skin: Skin color, texture, turgor normal. No rashes or lesions Neurologic: Grossly normal Psych: Pleasant  Inpatient Medications    Scheduled Meds: . aspirin EC  81 mg Oral Daily  . cholecalciferol  1,000 Units Oral Daily  . clopidogrel  75 mg Oral Daily  . cyanocobalamin  500 mcg Oral Daily  . enoxaparin (LOVENOX)  injection  40 mg Subcutaneous Q24H  . ezetimibe  10 mg Oral Daily  . isosorbide mononitrate  120 mg Oral Daily  . losartan  25 mg Oral Daily  . metoprolol succinate  25 mg Oral Daily    Continuous Infusions:   PRN Meds: acetaminophen, ALPRAZolam, famotidine, nitroGLYCERIN, ondansetron (ZOFRAN) IV, polyvinyl alcohol, traMADol   Labs   Results for orders placed or performed during the hospital encounter of 04/10/17 (from the past 48 hour(s))  Protime-INR     Status: None   Collection Time: 04/10/17  1:00 PM  Result Value Ref Range   Prothrombin Time 12.8 11.4 - 15.2 seconds   INR 0.97   CBC with Differential     Status: Abnormal   Collection Time: 04/10/17  1:00 PM  Result Value Ref Range   WBC 5.6 4.0 - 10.5 K/uL   RBC 4.30 4.22 - 5.81 MIL/uL   Hemoglobin 12.4 (L) 13.0 - 17.0 g/dL   HCT 37.1 (L) 39.0 - 52.0 %   MCV 86.3 78.0 - 100.0 fL   MCH 28.8 26.0 - 34.0 pg   MCHC  33.4 30.0 - 36.0 g/dL   RDW 15.1 11.5 - 15.5 %   Platelets 180 150 - 400 K/uL   Neutrophils Relative % 56 %   Neutro Abs 3.1 1.7 - 7.7 K/uL   Lymphocytes Relative 29 %   Lymphs Abs 1.6 0.7 - 4.0 K/uL   Monocytes Relative 9 %   Monocytes Absolute 0.5 0.1 - 1.0 K/uL   Eosinophils Relative 6 %   Eosinophils Absolute 0.3 0.0 - 0.7 K/uL   Basophils Relative 0 %   Basophils Absolute 0.0 0.0 - 0.1 K/uL  Comprehensive metabolic panel     Status: Abnormal   Collection Time: 04/10/17  1:00 PM  Result Value Ref Range   Sodium 134 (L) 135 - 145 mmol/L   Potassium 4.4 3.5 - 5.1 mmol/L   Chloride 107 101 - 111 mmol/L   CO2 21 (L) 22 - 32 mmol/L   Glucose, Bld 211 (H) 65 - 99 mg/dL   BUN 11 6 - 20 mg/dL   Creatinine, Ser 1.10 0.61 - 1.24 mg/dL   Calcium 9.3 8.9 - 10.3 mg/dL   Total Protein 6.5 6.5 - 8.1 g/dL   Albumin 3.8 3.5 - 5.0 g/dL   AST 23 15 - 41 U/L   ALT 16 (L) 17 - 63 U/L   Alkaline Phosphatase 57 38 - 126 U/L   Total Bilirubin 0.9 0.3 - 1.2 mg/dL   GFR calc non Af Amer 59 (L) >60 mL/min   GFR calc  Af Amer >60 >60 mL/min    Comment: (NOTE) The eGFR has been calculated using the CKD EPI equation. This calculation has not been validated in all clinical situations. eGFR's persistently <60 mL/min signify possible Chronic Kidney Disease.    Anion gap 6 5 - 15  Lipase, blood     Status: None   Collection Time: 04/10/17  1:00 PM  Result Value Ref Range   Lipase 20 11 - 51 U/L  Brain natriuretic peptide     Status: None   Collection Time: 04/10/17  1:01 PM  Result Value Ref Range   B Natriuretic Peptide 94.9 0.0 - 100.0 pg/mL  I-stat troponin, ED     Status: None   Collection Time: 04/10/17  1:43 PM  Result Value Ref Range   Troponin i, poc 0.01 0.00 - 0.08 ng/mL   Comment 3            Comment: Due to the release kinetics of cTnI, a negative result within the first hours of the onset of symptoms does not rule out myocardial infarction with certainty. If myocardial infarction is still suspected, repeat the test at appropriate intervals.   Troponin I     Status: None   Collection Time: 04/10/17  4:07 PM  Result Value Ref Range   Troponin I <0.03 <0.03 ng/mL  I-stat troponin, ED     Status: None   Collection Time: 04/10/17  4:19 PM  Result Value Ref Range   Troponin i, poc 0.01 0.00 - 0.08 ng/mL   Comment 3            Comment: Due to the release kinetics of cTnI, a negative result within the first hours of the onset of symptoms does not rule out myocardial infarction with certainty. If myocardial infarction is still suspected, repeat the test at appropriate intervals.   Troponin I     Status: None   Collection Time: 04/10/17  9:56 PM  Result Value Ref Range   Troponin  I <0.03 <0.03 ng/mL  Troponin I     Status: None   Collection Time: 04/11/17  3:56 AM  Result Value Ref Range   Troponin I <0.03 <0.03 ng/mL    ECG   Normal sinus rhythm at 62 no ischemic changes- Personally Reviewed  Telemetry   Sinus rhythm- Personally Reviewed  Radiology    Dg Chest 2  View  Result Date: 04/10/2017 CLINICAL DATA:  Chest pain. EXAM: CHEST  2 VIEW COMPARISON:  Chest x-ray dated January 25, 2017. FINDINGS: Lordotic positioning. The cardiomediastinal silhouette is normal in size. Normal pulmonary vascularity. No focal consolidation, pleural effusion, or pneumothorax. No acute osseous abnormality. IMPRESSION: No active cardiopulmonary disease. Electronically Signed   By: Titus Dubin M.D.   On: 04/10/2017 13:58    Cardiac Studies   N/A  Assessment   1. Active Problems: 2.   Chest pain 3.   Plan   1. Atypical chest pain overnight improved with flatulence.  Suspect GI etiology.  Troponin negative x5.  No chest pain complaints today.  Can safely be discharged.  Follow-up with Dr. Oval Linsey after discharge.  Time Spent Directly with Patient:  I have spent a total of 15 minutes with the patient reviewing hospital notes, telemetry, EKGs, labs and examining the patient as well as establishing an assessment and plan that was discussed personally with the patient. > 50% of time was spent in direct patient care.  Length of Stay:  LOS: 0 days   Pixie Casino, MD, [place]  Attending Cardiologist  Direct Dial: [phone]  Fax: [phone]  Website:  [email or web address] 04/11/2017, 11:26 AM

## 2017-04-13 ENCOUNTER — Ambulatory Visit: Payer: PPO | Admitting: Cardiovascular Disease

## 2017-05-06 ENCOUNTER — Ambulatory Visit: Payer: PPO | Admitting: Cardiovascular Disease

## 2017-05-06 ENCOUNTER — Encounter: Payer: Self-pay | Admitting: Cardiovascular Disease

## 2017-05-06 VITALS — BP 134/73 | HR 78 | Ht 70.5 in | Wt 202.4 lb

## 2017-05-06 DIAGNOSIS — I10 Essential (primary) hypertension: Secondary | ICD-10-CM

## 2017-05-06 DIAGNOSIS — I5042 Chronic combined systolic (congestive) and diastolic (congestive) heart failure: Secondary | ICD-10-CM

## 2017-05-06 DIAGNOSIS — I2510 Atherosclerotic heart disease of native coronary artery without angina pectoris: Secondary | ICD-10-CM | POA: Diagnosis not present

## 2017-05-06 DIAGNOSIS — E785 Hyperlipidemia, unspecified: Secondary | ICD-10-CM

## 2017-05-06 NOTE — Progress Notes (Signed)
Cardiology Office Note   Date:  05/06/2017   ID:  Richard Burgess, DOB 1929/01/31, MRN [id number]  PCP:  Cassandria Anger, MD  Cardiologist:   [name], MD   Chief Complaint  Patient presents with  . Follow-up    History of Present Illness: Richard Burgess is a 83 y.o. male with hypertension, hyperlipidemia, aortic aneurysm, carotid stenosis, R subclavian steal, who presents for follow up.  Richard Burgess presented to the ED with NSTEMI 01/2017.  He underwent left heart catheterization 01/2017 and was noted to have 3v CAD (100% RCA, 40% OM, 60% ostial LAD, and 80% OM1.  LVEF was 45-50%.  He had L-->R collaterals to the LAD.  Medical management was recommended.  He was started on clopidogrel with plans for 12 months of DAPT.  He had a history of statin intolerance.  He was started on Zetia in the hospital.  LDL was 94.   At his last appointment he reported exertional angina and was started on isosorbide.  Since his last appointment Richard Burgess was seen in the ED 04/10/17 with chest pain.  The episode occurred while mowing his lawn.  Cardiac enzymes were negative.  Imdur was increased.  There is also concerned that his symptoms could be related to GERD.  Since being discharged from the hospital Richard Burgess has been doing well.  His blood pressure was initially very elevated after increasing imdur.  He saw his PCP at the [place] and Imdur was decreased to 60 mg daily.  Since then he has been feeling well.  He denies any recurrent chest pain.  His blood pressure has been well-controlled at home.  He continues to be active in his garden.  He has not noted any shortness of breath, lower extremity edema, orthopnea, or PND.  Past Medical History:  Diagnosis Date  . Allergy   . BPH (benign prostatic hypertrophy)   . Carotid artery occlusion   . Diverticulosis of colon   . ED (erectile dysfunction)   . GERD (gastroesophageal reflux disease)   . Glucose intolerance (impaired glucose tolerance)   .  History of pancreatitis   . Hx-TIA (transient ischemic attack)   . Hyperlipidemia   . Hypertension   . Hypertention, malignant, with acute intensive management   . Low back pain   . Osteoarthritis   . PVD (peripheral vascular disease) ([place])    Bilateral carotid  . Tubular adenoma of colon 08/1991   One with carcinoma IN [facility] 1993, multiple adenomatous    Past Surgical History:  Procedure Laterality Date  . APPENDECTOMY    . BASAL CELL CARCINOMA EXCISION    . CHOLECYSTECTOMY    . COLONOSCOPY    . ESOPHAGOGASTRODUODENOSCOPY    . LEFT HEART CATH AND CORONARY ANGIOGRAPHY N/A 01/26/2017   Procedure: LEFT HEART CATH AND CORONARY ANGIOGRAPHY;  Surgeon: Lorretta Harp, MD;  Location: [facility];  Service: Cardiovascular;  Laterality: N/A;  . LUMBAR SPINE SURGERY     X4     Current Outpatient Medications  Medication Sig Dispense Refill  . aspirin EC 81 MG tablet Take 81 mg by mouth daily.    . cholecalciferol (VITAMIN D) 1000 units tablet Take 1,000 Units by mouth daily.    . clopidogrel (PLAVIX) 75 MG tablet Take 1 tablet (75 mg total) by mouth daily. 30 tablet 11  . cyanocobalamin 500 MCG tablet Take 500 mcg by mouth daily. Vitamin B12    . ezetimibe (ZETIA) 10 MG  tablet Take 1 tablet (10 mg total) by mouth daily. 30 tablet 11  . isosorbide mononitrate (IMDUR) 60 MG 24 hr tablet Take 60 mg by mouth daily.    Marland Kitchen losartan (COZAAR) 25 MG tablet Take 1 tablet (25 mg total) by mouth daily. 30 tablet 5  . metoprolol succinate (TOPROL XL) 25 MG 24 hr tablet Take 1 tablet (25 mg total) by mouth daily. 90 tablet 3  . Multiple Vitamins-Minerals (PRESERVISION AREDS 2) CAPS Take 1 capsule by mouth daily with supper.    . nitroGLYCERIN (NITROSTAT) 0.4 MG SL tablet Place 1 tablet (0.4 mg total) under the tongue every 5 (five) minutes as needed for chest pain. 20 tablet 1  . Polyethyl Glycol-Propyl Glycol (SYSTANE OP) Place 1 drop into both eyes 4 (four) times daily as needed (dry eyes).      . ranitidine (ZANTAC) 150 MG tablet Take 150 mg by mouth daily.    . traMADol (ULTRAM) 50 MG tablet TAKE 1 TABLET EVERY 8 HOURS AS NEEDED 90 tablet 3  . zolpidem (AMBIEN) 5 MG tablet TAKE 1 TABLET BY MOUTH AT BEDTIME AS NEEDED FOR SLEEP 30 tablet 3   No current facility-administered medications for this visit.     Allergies:   Amlodipine; Losartan; Oxycodone; Pravastatin sodium; Rosuvastatin; and Statins    Social History:  The patient  reports that he has quit smoking. His smoking use included cigarettes. He quit after 20.00 years of use. he has never used smokeless tobacco. He reports that he drinks about 4.2 - 8.4 oz of alcohol per week. He reports that he does not use drugs.   Family History:  The patient's family history includes Colon cancer in his sister; Dementia in his father; Diabetes in his mother; Peripheral vascular disease in his brother.    ROS:  Please see the history of present illness.   Otherwise, review of systems are positive for none.   All other systems are reviewed and negative.    PHYSICAL EXAM: VS:  BP 134/73   Pulse 78   Ht 5' 10.5" (1.791 m)   Wt 202 lb 6.4 oz (91.8 kg)   BMI 28.63 kg/m  , BMI Body mass index is 28.63 kg/m. GENERAL:  Well appearing HEENT: Pupils equal round and reactive, fundi not visualized, oral mucosa unremarkable NECK:  No jugular venous distention, waveform within normal limits, carotid upstroke brisk and symmetric, no bruits, no thyromegaly LUNGS:  Clear to auscultation bilaterally HEART:  RRR.  PMI not displaced or sustained,S1 and S2 within normal limits, no S3, no S4, no clicks, no rubs, no murmurs ABD:  Flat, positive bowel sounds normal in frequency in pitch, no bruits, no rebound, no guarding, no midline pulsatile mass, no hepatomegaly, no splenomegaly EXT:  2 plus pulses throughout, no edema, no cyanosis no clubbing SKIN:  No rashes no nodules NEURO:  Cranial nerves II through XII grossly intact, motor grossly intact  throughout PSYCH:  Cognitively intact, oriented to person place and time   EKG:  EKG is not ordered today.   LEFT HEART CATH AND CORONARY ANGIOGRAPHY 01/26/17  Conclusion   Prox RCA to Mid RCA lesion, 100 %stenosed.  Ost LM lesion, 40 %stenosed.  Ost LAD lesion, 60 %stenosed.  1st Mrg lesion, 80 %stenosed.  There is mild to moderate left ventricular systolic dysfunction.  LV end diastolic pressure is mildly elevated.  The left ventricular ejection fraction is 45-50% by visual estimate.    Recent Labs: 04/10/2017: ALT 16; B Natriuretic  Peptide 94.9; BUN 11; Creatinine, Ser 1.10; Hemoglobin 12.4; Platelets 180; Potassium 4.4; Sodium 134    Lipid Panel    Component Value Date/Time   CHOL 189 03/20/2017 0857   TRIG 210 (H) 03/20/2017 0857   HDL 39 (L) 03/20/2017 0857   CHOLHDL 4.8 03/20/2017 0857   CHOLHDL 4.6 01/26/2017 0900   VLDL 30 01/26/2017 0900   LDLCALC 108 (H) 03/20/2017 0857   LDLDIRECT 153.0 02/18/2016 0826      Wt Readings from Last 3 Encounters:  05/06/17 202 lb 6.4 oz (91.8 kg)  04/11/17 193 lb 4.8 oz (87.7 kg)  03/20/17 196 lb (88.9 kg)      ASSESSMENT AND PLAN:  # Obstructive CAD: Richard Burgess has obstructive CAD that we are medically managing. He had one episode of angina but none since increasing Imdu.  Continue aspirin, clopidogrel, isosorbide, and metoprolol. Continue DAPT at least through 01/2018.  He was instructed that he cannot use sildenafil while on Imdur.  # Chronic systolic and diastolic heart failure:  LVEF 45-50% on LV gram.  Richard Burgess is euvolemic.  Continue metoprolol to succinate 25mg  daily and losartan 25mg  daily.   # Hyperlipidemia: Statin intolerant.  Continue Zetia.  LDL was 108 when last checked 03/2017.  However he was not fasting.  If his LDL remains greater than 70 we will refer him to lipid clinic for a PCSK9 inhibitor.     Current medicines are reviewed at length with the patient today.  The patient does not have  concerns regarding medicines.  The following changes have been made:  None  Labs/ tests ordered today include:   Orders Placed This Encounter  Procedures  . Comprehensive metabolic panel  . Lipid panel     Disposition:   FU with Vaughn Beaumier C. Oval Linsey, MD, [facility] in 3 months.     This note was written with the assistance of speech recognition software.  Please excuse any transcriptional errors.  Signed, Hara Milholland C. Oval Linsey, MD, [facility]  05/06/2017 1:13 PM    [facility]

## 2017-05-06 NOTE — Patient Instructions (Addendum)
Medication Instructions:  Your physician recommends that you continue on your current medications as directed. Please refer to the Current Medication list given to you today.   Labwork: FASTING LP/CMET SOON   Testing/Procedures: NONE  Follow-Up: Your physician recommends that you schedule a follow-up appointment in: 3 MONTH OV  If you need a refill on your cardiac medications before your next appointment, please call your pharmacy.

## 2017-05-11 DIAGNOSIS — E785 Hyperlipidemia, unspecified: Secondary | ICD-10-CM | POA: Diagnosis not present

## 2017-05-11 DIAGNOSIS — I10 Essential (primary) hypertension: Secondary | ICD-10-CM | POA: Diagnosis not present

## 2017-05-11 LAB — LIPID PANEL
CHOL/HDL RATIO: 4 ratio (ref 0.0–5.0)
Cholesterol, Total: 194 mg/dL (ref 100–199)
HDL: 48 mg/dL (ref >39)
LDL Calculated: 126 mg/dL — ABNORMAL HIGH (ref 0–99)
Triglycerides: 98 mg/dL (ref 0–149)
VLDL CHOLESTEROL CAL: 20 mg/dL (ref 5–40)

## 2017-05-11 LAB — COMPREHENSIVE METABOLIC PANEL
A/G RATIO: 2.1 (ref 1.2–2.2)
ALBUMIN: 4.8 g/dL — AB (ref 3.5–4.7)
ALT: 17 IU/L (ref 0–44)
AST: 16 IU/L (ref 0–40)
Alkaline Phosphatase: 72 IU/L (ref 39–117)
BILIRUBIN TOTAL: 0.9 mg/dL (ref 0.0–1.2)
BUN / CREAT RATIO: 13 (ref 10–24)
BUN: 14 mg/dL (ref 8–27)
CALCIUM: 9.7 mg/dL (ref 8.6–10.2)
CHLORIDE: 100 mmol/L (ref 96–106)
CO2: 23 mmol/L (ref 20–29)
Creatinine, Ser: 1.11 mg/dL (ref 0.76–1.27)
GFR, EST AFRICAN AMERICAN: 69 mL/min/{1.73_m2} (ref >59)
GFR, EST NON AFRICAN AMERICAN: 60 mL/min/{1.73_m2} (ref >59)
Globulin, Total: 2.3 g/dL (ref 1.5–4.5)
Glucose: 118 mg/dL — ABNORMAL HIGH (ref 65–99)
POTASSIUM: 4.8 mmol/L (ref 3.5–5.2)
Sodium: 139 mmol/L (ref 134–144)
TOTAL PROTEIN: 7.1 g/dL (ref 6.0–8.5)

## 2017-05-16 ENCOUNTER — Encounter: Payer: Self-pay | Admitting: Cardiovascular Disease

## 2017-05-18 ENCOUNTER — Ambulatory Visit: Payer: PPO | Admitting: Family

## 2017-05-18 ENCOUNTER — Encounter (HOSPITAL_COMMUNITY): Payer: PPO

## 2017-05-20 ENCOUNTER — Telehealth: Payer: Self-pay | Admitting: Cardiovascular Disease

## 2017-05-20 MED ORDER — LOSARTAN POTASSIUM 25 MG PO TABS: 25.0000 mg | ORAL_TABLET | Freq: Every day | ORAL | 2 refills | Status: DC

## 2017-05-20 NOTE — Telephone Encounter (Signed)
°*  STAT* If patient is at the pharmacy, call can be transferred to refill team.   1. Which medications need to be refilled? (please list name of each medication and dose if known) Losartan 25 mg  2. Which pharmacy/location (including street and city if local pharmacy) is medication to be sent to?[id number] - [place], [place] - [address].  3. Do they need a 30 day or 90 day supply? 90  Patient is out of medication

## 2017-06-04 ENCOUNTER — Encounter: Payer: Self-pay | Admitting: Pharmacist Clinician (PhC)/ Clinical Pharmacy Specialist

## 2017-06-04 ENCOUNTER — Ambulatory Visit (INDEPENDENT_AMBULATORY_CARE_PROVIDER_SITE_OTHER): Payer: PPO | Admitting: Pharmacist Clinician (PhC)/ Clinical Pharmacy Specialist

## 2017-06-04 DIAGNOSIS — E785 Hyperlipidemia, unspecified: Secondary | ICD-10-CM

## 2017-06-04 DIAGNOSIS — I10 Essential (primary) hypertension: Secondary | ICD-10-CM | POA: Diagnosis not present

## 2017-06-04 NOTE — Assessment & Plan Note (Signed)
Patient BP looks good in the office today.  He is currently on metoprolol succ 25 mg daily and losaratn 25 mg daily.  He believes the losartan is the cause of his leg weakness.   I offered to switch the losartan to lisinopril, but his daughter notes that he took lisinopril previously.  She cannot recall why it was discontinued.  For now he will continue with losartan and we will consider holding it after a trial without ezetimibe.  He knows to call the office after holding meds to let us know if either was the cause.  If it was losartan we can consider rechallenging with lisinopril or trying chlorthalidone.  He will also check his home BP daily if he comes off the losartan and let us know that information.

## 2017-06-04 NOTE — Assessment & Plan Note (Addendum)
Patient with elevated lipids, unable to tolerate statins due to muscle aches/pains as well as nightmares.    Reviewed options of PCSK-9i versus research.  Patient willing to try Repatha.  Explained injection technique and offer to do first dose in the office once approved.   Will submit information to his insurance company.

## 2017-06-04 NOTE — Progress Notes (Signed)
06/04/2017 CONNELL BOGNAR 01-27-1929 [id number]   HPI:  Richard Burgess is a 83 y.o. male patient of Dr Oval Linsey, who presents today for a lipid clinic evaluation.  His cardiac history is significant for hypertension, aortic aneurysm, carotid stenosis, R subclavian steal and CAD with NSTEMI in August 2018.  A heart catheterization showed 3 vessel disease (100% RCA, 40% OM, 60% ostial LAD and 80% OM1), with an EF of 45-50%.   At that time he was started on ezetimibe, as he previously had problems with statins.    Today he is in the office to discuss cholesterol lowering options.  He is complaining about leg weakness/aches and believes it to be caused by his losartan.  It is listed in his chart as an allergy for that reason, so I'm not sure how he got back on the medication.     Current Medications:  Ezetimibe 10 mg daily  Cholesterol Goals:   LDD <70 Intolerant/previously tried:  Took rosuvastatin 10 mg and pravastatin 20 mg, both in 2011 from Dr. Alain Marion.  Chart indicates caused weakness and nightmares  Family history:   Both parents lived into their 91's, dad with atherosclerosis  2/3 children with hyperlipidemia  Diet:   Mostly home cooked meals; likes eggs, bacon, sausage; no limitations, likes sweets  Exercise:    Doesn't sit still, likes to work in yard, garden  Labs:   05/2017:  TC 194, TG 98, HDL 48, LDL 126    Current Outpatient Medications  Medication Sig Dispense Refill  . aspirin EC 81 MG tablet Take 81 mg by mouth daily.    . cholecalciferol (VITAMIN D) 1000 units tablet Take 1,000 Units by mouth daily.    . clopidogrel (PLAVIX) 75 MG tablet Take 1 tablet (75 mg total) by mouth daily. 30 tablet 11  . cyanocobalamin 500 MCG tablet Take 500 mcg by mouth daily. Vitamin B12    . ezetimibe (ZETIA) 10 MG tablet Take 1 tablet (10 mg total) by mouth daily. 30 tablet 11  . isosorbide mononitrate (IMDUR) 60 MG 24 hr tablet Take 60 mg by mouth daily.    Marland Kitchen losartan (COZAAR) 25  MG tablet Take 1 tablet (25 mg total) by mouth daily. 90 tablet 2  . metoprolol succinate (TOPROL XL) 25 MG 24 hr tablet Take 1 tablet (25 mg total) by mouth daily. 90 tablet 3  . Multiple Vitamins-Minerals (PRESERVISION AREDS 2) CAPS Take 1 capsule by mouth daily with supper.    . nitroGLYCERIN (NITROSTAT) 0.4 MG SL tablet Place 1 tablet (0.4 mg total) under the tongue every 5 (five) minutes as needed for chest pain. 20 tablet 1  . Polyethyl Glycol-Propyl Glycol (SYSTANE OP) Place 1 drop into both eyes 4 (four) times daily as needed (dry eyes).    . ranitidine (ZANTAC) 150 MG tablet Take 150 mg by mouth daily.    . traMADol (ULTRAM) 50 MG tablet TAKE 1 TABLET EVERY 8 HOURS AS NEEDED 90 tablet 3  . zolpidem (AMBIEN) 5 MG tablet TAKE 1 TABLET BY MOUTH AT BEDTIME AS NEEDED FOR SLEEP 30 tablet 3   No current facility-administered medications for this visit.     Allergies  Allergen Reactions  . Amlodipine Other (See Comments)    Weakness in legs  . Losartan Other (See Comments)    Leg numbness/stiffness.   . Oxycodone Other (See Comments)    crazy  . Pravastatin Sodium Other (See Comments)    REACTION: bad dreams  . Rosuvastatin Palpitations  .  Statins Palpitations and Other (See Comments)    REACTION: aches, bad dreams    Past Medical History:  Diagnosis Date  . Allergy   . BPH (benign prostatic hypertrophy)   . Carotid artery occlusion   . Diverticulosis of colon   . ED (erectile dysfunction)   . GERD (gastroesophageal reflux disease)   . Glucose intolerance (impaired glucose tolerance)   . History of pancreatitis   . Hx-TIA (transient ischemic attack)   . Hyperlipidemia   . Hypertension   . Hypertention, malignant, with acute intensive management   . Low back pain   . Osteoarthritis   . PVD (peripheral vascular disease) ([place])    Bilateral carotid  . Tubular adenoma of colon 08/1991   One with carcinoma IN [place] 1993, multiple adenomatous    Blood pressure 118/68, pulse  68.   Essential hypertension Patient BP looks good in the office today.  He is currently on metoprolol succ 25 mg daily and losaratn 25 mg daily.  He believes the losartan is the cause of his leg weakness.   I offered to switch the losartan to lisinopril, but his daughter notes that he took lisinopril previously.  She cannot recall why it was discontinued.  For now he will continue with losartan and we will consider holding it after a trial without ezetimibe.  He knows to call the office after holding meds to let us know if either was the cause.  If it was losartan we can consider rechallenging with lisinopril or trying chlorthalidone.  He will also check his home BP daily if he comes off the losartan and let us know that information.    Dyslipidemia Patient with elevated lipids, unable to tolerate statins due to muscle aches/pains as well as nightmares.    Reviewed options of PCSK-9i versus research.  Patient willing to try Repatha.  Explained injection technique and offer to do first dose in the office once approved.   Will submit information to his insurance company.     Tommy Medal PharmD CPP [facility]

## 2017-06-04 NOTE — Patient Instructions (Signed)
Stop ezetimibe 10 mg tablets for 1 week.  If you feel better please call and let us know.  If you feel no different, you can restart it and stop the losartan.  Again, let us know if this is the cause of your problems, as that would need to be replaced.  Hassell Patras/Raquel at [phone]  We will start the paperwork to get Repatha covered for you by your insurance company.     Evolocumab injection What is this medicine? EVOLOCUMAB (e voe LOK ue mab) is known as a PCSK9 inhibitor. It is used to lower the level of cholesterol in the blood. It may be used alone or in combination with other cholesterol-lowering drugs. This drug may also be used to reduce the risk of heart attack, stroke, and certain types of heart surgery in patients with heart disease. This medicine may be used for other purposes; ask your health care provider or pharmacist if you have questions. COMMON BRAND NAME(S): REPATHA What should I tell my health care provider before I take this medicine? They need to know if you have any of these conditions: -an unusual or allergic reaction to evolocumab, other medicines, foods, dyes, or preservatives -pregnant or trying to get pregnant -breast-feeding How should I use this medicine? This medicine is for injection under the skin. You will be taught how to prepare and give this medicine. Use exactly as directed. Take your medicine at regular intervals. Do not take your medicine more often than directed. It is important that you put your used needles and syringes in a special sharps container. Do not put them in a trash can. If you do not have a sharps container, call your pharmacist or health care provider to get one. Talk to your pediatrician regarding the use of this medicine in children. While this drug may be prescribed for children as young as 13 years for selected conditions, precautions do apply. Overdosage: If you think you have taken too much of this medicine contact a poison control  center or emergency room at once. NOTE: This medicine is only for you. Do not share this medicine with others. What if I miss a dose? If you miss a dose, take it as soon as you can if there are more than 7 days until the next scheduled dose, or skip the missed dose and take the next dose according to your original schedule. Do not take double or extra doses. What may interact with this medicine? Interactions are not expected. This list may not describe all possible interactions. Give your health care provider a list of all the medicines, herbs, non-prescription drugs, or dietary supplements you use. Also tell them if you smoke, drink alcohol, or use illegal drugs. Some items may interact with your medicine. What should I watch for while using this medicine? You may need blood work while you are taking this medicine. What side effects may I notice from receiving this medicine? Side effects that you should report to your doctor or health care professional as soon as possible: -allergic reactions like skin rash, itching or hives, swelling of the face, lips, or tongue -signs and symptoms of infection like fever or chills; cough; sore throat; pain or trouble passing urine Side effects that usually do not require medical attention (report to your doctor or health care professional if they continue or are bothersome): -diarrhea -nausea -muscle pain -pain, redness, or irritation at site where injected This list may not describe all possible side effects. Call your doctor for  medical advice about side effects. You may report side effects to FDA at 1-800-FDA-1088. Where should I keep my medicine? Keep out of the reach of children. You will be instructed on how to store this medicine. Throw away any unused medicine after the expiration date on the label. NOTE: This sheet is a summary. It may not cover all possible information. If you have questions about this medicine, talk to your doctor, pharmacist, or  health care provider.  2018 Elsevier/Gold Standard (2016-05-12 13:21:53)

## 2017-06-16 ENCOUNTER — Telehealth: Payer: Self-pay | Admitting: Pharmacist Clinician (PhC)/ Clinical Pharmacy Specialist

## 2017-06-16 NOTE — Telephone Encounter (Signed)
Daughter called to report that patient is feeling much better now that he has been off ezetimbe for a week.  Would like to stay permanently.    Also concerned about ongoing "sartan" recall.  Patient takes losartan 25 mg daily.  Newest FDA update includes 25 mg from at least one manufacturer.  Advised that they call their pharmacy to see if part of the recall.   LMOM with above information

## 2017-06-23 ENCOUNTER — Encounter: Payer: Self-pay | Admitting: Internal Medicine

## 2017-06-23 ENCOUNTER — Ambulatory Visit (INDEPENDENT_AMBULATORY_CARE_PROVIDER_SITE_OTHER): Payer: PPO | Admitting: Internal Medicine

## 2017-06-23 DIAGNOSIS — E538 Deficiency of other specified B group vitamins: Secondary | ICD-10-CM

## 2017-06-23 DIAGNOSIS — G459 Transient cerebral ischemic attack, unspecified: Secondary | ICD-10-CM | POA: Diagnosis not present

## 2017-06-23 DIAGNOSIS — I739 Peripheral vascular disease, unspecified: Secondary | ICD-10-CM | POA: Diagnosis not present

## 2017-06-23 DIAGNOSIS — E785 Hyperlipidemia, unspecified: Secondary | ICD-10-CM | POA: Diagnosis not present

## 2017-06-23 MED ORDER — PITAVASTATIN CALCIUM 1 MG PO TABS: 1.0000 mg | ORAL_TABLET | Freq: Every day | ORAL | 11 refills | Status: DC

## 2017-06-23 NOTE — Assessment & Plan Note (Signed)
F/u w/Lipid Clinic. He is sceptical about Repatha and other meds. On Zetia We can try Livalo or Zypitamag low dose if tolerated F/u w/Lipid Clinic

## 2017-06-23 NOTE — Progress Notes (Signed)
Subjective:  Patient ID: Richard Burgess, male    DOB: 10-10-1928  Age: 84 y.o. MRN: [id number]  CC: No chief complaint on file.   HPI Richard Burgess presents for CAD, dyslipidemia, B12 def States he had a flu shot C/o elevated lipids - Lipid Clinic. He is sceptical about Repatha and other meds.   Outpatient Medications Prior to Visit  Medication Sig Dispense Refill  . aspirin EC 81 MG tablet Take 81 mg by mouth daily.    . cholecalciferol (VITAMIN D) 1000 units tablet Take 1,000 Units by mouth daily.    . clopidogrel (PLAVIX) 75 MG tablet Take 1 tablet (75 mg total) by mouth daily. 30 tablet 11  . cyanocobalamin 500 MCG tablet Take 500 mcg by mouth daily. Vitamin B12    . ezetimibe (ZETIA) 10 MG tablet Take 1 tablet (10 mg total) by mouth daily. 30 tablet 11  . isosorbide mononitrate (IMDUR) 60 MG 24 hr tablet Take 60 mg by mouth daily.    Marland Kitchen losartan (COZAAR) 25 MG tablet Take 1 tablet (25 mg total) by mouth daily. 90 tablet 2  . metoprolol succinate (TOPROL XL) 25 MG 24 hr tablet Take 1 tablet (25 mg total) by mouth daily. 90 tablet 3  . Multiple Vitamins-Minerals (PRESERVISION AREDS 2) CAPS Take 1 capsule by mouth daily with supper.    . nitroGLYCERIN (NITROSTAT) 0.4 MG SL tablet Place 1 tablet (0.4 mg total) under the tongue every 5 (five) minutes as needed for chest pain. 20 tablet 1  . Polyethyl Glycol-Propyl Glycol (SYSTANE OP) Place 1 drop into both eyes 4 (four) times daily as needed (dry eyes).    . ranitidine (ZANTAC) 150 MG tablet Take 150 mg by mouth daily.    . traMADol (ULTRAM) 50 MG tablet TAKE 1 TABLET EVERY 8 HOURS AS NEEDED 90 tablet 3  . zolpidem (AMBIEN) 5 MG tablet TAKE 1 TABLET BY MOUTH AT BEDTIME AS NEEDED FOR SLEEP 30 tablet 3   No facility-administered medications prior to visit.     ROS Review of Systems  Constitutional: Negative for appetite change, fatigue and unexpected weight change.  HENT: Negative for congestion, nosebleeds, sneezing, sore throat  and trouble swallowing.   Eyes: Negative for itching and visual disturbance.  Respiratory: Negative for cough.   Cardiovascular: Negative for chest pain, palpitations and leg swelling.  Gastrointestinal: Negative for abdominal distention, blood in stool, diarrhea and nausea.  Genitourinary: Negative for frequency and hematuria.  Musculoskeletal: Positive for arthralgias. Negative for back pain, gait problem, joint swelling and neck pain.  Skin: Negative for rash.  Neurological: Negative for dizziness, tremors, speech difficulty and weakness.  Psychiatric/Behavioral: Negative for agitation, dysphoric mood and sleep disturbance. The patient is not nervous/anxious.     Objective:  BP 124/62 (BP Location: Left Arm, Patient Position: Sitting, Cuff Size: Large)   Pulse 66   Temp (!) 97.5 F (36.4 C) (Oral)   Ht 5' 10.5" (1.791 m)   Wt 202 lb (91.6 kg)   SpO2 97%   BMI 28.57 kg/m   BP Readings from Last 3 Encounters:  06/23/17 124/62  06/04/17 118/68  05/06/17 134/73    Wt Readings from Last 3 Encounters:  06/23/17 202 lb (91.6 kg)  05/06/17 202 lb 6.4 oz (91.8 kg)  04/11/17 193 lb 4.8 oz (87.7 kg)    Physical Exam  Constitutional: He is oriented to person, place, and time. He appears well-developed. No distress.  NAD  HENT:  Mouth/Throat: Oropharynx is  clear and moist.  Eyes: Conjunctivae are normal. Pupils are equal, round, and reactive to light.  Neck: Normal range of motion. No JVD present. No thyromegaly present.  Cardiovascular: Normal rate, regular rhythm, normal heart sounds and intact distal pulses. Exam reveals no gallop and no friction rub.  No murmur heard. Pulmonary/Chest: Effort normal and breath sounds normal. No respiratory distress. He has no wheezes. He has no rales. He exhibits no tenderness.  Abdominal: Soft. Bowel sounds are normal. He exhibits no distension and no mass. There is no tenderness. There is no rebound and no guarding.  Musculoskeletal: Normal  range of motion. He exhibits no edema or tenderness.  Lymphadenopathy:    He has no cervical adenopathy.  Neurological: He is alert and oriented to person, place, and time. He has normal reflexes. No cranial nerve deficit. He exhibits normal muscle tone. He displays a negative Romberg sign. Coordination and gait normal.  Skin: Skin is warm and dry. No rash noted.  Psychiatric: He has a normal mood and affect. His behavior is normal. Judgment and thought content normal.    Lab Results  Component Value Date   WBC 5.6 04/10/2017   HGB 12.4 (L) 04/10/2017   HCT 37.1 (L) 04/10/2017   PLT 180 04/10/2017   GLUCOSE 118 (H) 05/11/2017   CHOL 194 05/11/2017   TRIG 98 05/11/2017   HDL 48 05/11/2017   LDLDIRECT 153.0 02/18/2016   LDLCALC 126 (H) 05/11/2017   ALT 17 05/11/2017   AST 16 05/11/2017   NA 139 05/11/2017   K 4.8 05/11/2017   CL 100 05/11/2017   CREATININE 1.11 05/11/2017   BUN 14 05/11/2017   CO2 23 05/11/2017   TSH 2.88 02/18/2016   PSA 0.28 03/23/2014   INR 0.97 04/10/2017   HGBA1C 5.9 (H) 01/25/2017   MICROALBUR 0.2 12/13/2007    Dg Chest 2 View  Result Date: 04/10/2017 CLINICAL DATA:  Chest pain. EXAM: CHEST  2 VIEW COMPARISON:  Chest x-ray dated January 25, 2017. FINDINGS: Lordotic positioning. The cardiomediastinal silhouette is normal in size. Normal pulmonary vascularity. No focal consolidation, pleural effusion, or pneumothorax. No acute osseous abnormality. IMPRESSION: No active cardiopulmonary disease. Electronically Signed   By: Titus Dubin M.D.   On: 04/10/2017 13:58    Assessment & Plan:   There are no diagnoses linked to this encounter. I am having Richard Burgess maintain his zolpidem, nitroGLYCERIN, cholecalciferol, cyanocobalamin, ranitidine, aspirin EC, PRESERVISION AREDS 2, Polyethyl Glycol-Propyl Glycol (SYSTANE OP), clopidogrel, ezetimibe, metoprolol succinate, traMADol, isosorbide mononitrate, and losartan.  No orders of the defined types were  placed in this encounter.    Follow-up: No Follow-up on file.  [name], MD

## 2017-06-23 NOTE — Assessment & Plan Note (Signed)
On B12 

## 2017-06-23 NOTE — Assessment & Plan Note (Addendum)
F/u w/Lipid Clinic. He is sceptical about Repatha and other meds. On Zetia We can try Livalo or Zypitamag low dose if tolerated F/u w/Lipid Clinic

## 2017-06-26 ENCOUNTER — Telehealth: Payer: Self-pay | Admitting: Internal Medicine

## 2017-06-26 NOTE — Telephone Encounter (Signed)
Please advise 

## 2017-06-26 NOTE — Telephone Encounter (Signed)
Pt.notified

## 2017-06-26 NOTE — Telephone Encounter (Signed)
Pt states he took first dose of Pitavastatin(Livalo) 1mg  last night and began to have symptoms of knee aching down to the bone and ankle, felt as if his heart was "flip-flopping" and difficulty breathing. Pt states the pain caused him to wake up from his sleep and continued throughout the night. Pt denies having any of these symptoms this morning and states he feels fine at this time. Pt states he will not take any more doses of Pitavastatin due to the side effects he experienced last night.

## 2017-06-26 NOTE — Telephone Encounter (Signed)
pls stop Livalo Thx

## 2017-07-03 ENCOUNTER — Encounter: Payer: Self-pay | Admitting: Family

## 2017-07-03 ENCOUNTER — Ambulatory Visit (INDEPENDENT_AMBULATORY_CARE_PROVIDER_SITE_OTHER): Payer: PPO | Admitting: Family

## 2017-07-03 ENCOUNTER — Ambulatory Visit (HOSPITAL_COMMUNITY)
Admission: RE | Admit: 2017-07-03 | Discharge: 2017-07-03 | Disposition: A | Discharge status: Home / Self Care | Payer: PPO | Source: Ambulatory Visit | Attending: Family | Admitting: Family

## 2017-07-03 ENCOUNTER — Other Ambulatory Visit: Payer: Self-pay

## 2017-07-03 VITALS — BP 135/82 | HR 58 | Temp 97.7°F | Resp 18 | Ht 71.0 in | Wt 199.0 lb

## 2017-07-03 DIAGNOSIS — I6523 Occlusion and stenosis of bilateral carotid arteries: Secondary | ICD-10-CM | POA: Diagnosis not present

## 2017-07-03 DIAGNOSIS — Z87891 Personal history of nicotine dependence: Secondary | ICD-10-CM | POA: Diagnosis not present

## 2017-07-03 LAB — VAS US CAROTID
LCCADSYS: -72 cm/s
LCCAPDIAS: 16 cm/s
LCCAPSYS: 70 cm/s
LEFT VERTEBRAL DIAS: 10 cm/s
LICADDIAS: -20 cm/s
LICADSYS: -61 cm/s
LICAPSYS: 67 cm/s
Left CCA dist dias: -16 cm/s
Left ICA prox dias: 17 cm/s
RCCAPDIAS: 11 cm/s
RIGHT CCA MID DIAS: -14 cm/s
RIGHT ECA DIAS: -6 cm/s
Right CCA prox sys: 62 cm/s
Right cca dist sys: 34 cm/s

## 2017-07-03 NOTE — Patient Instructions (Signed)

## 2017-07-03 NOTE — Progress Notes (Signed)
Chief Complaint: Follow up Extracranial Carotid Artery Stenosis   History of Present Illness  Richard Burgess is a 84 y.o. male whom Dr. Trula Slade has been monitoring and is back today for follow-up of his extracranial carotid artery occlusive disease.   The patient denies any history of TIA or stroke symptoms, specifically the patient denies a history of amaurosis fugax or monocular blindness, unilateral facial drooping, hemiplegia, or receptive or expressive aphasia.   He denies claudication symptoms with walking. He stays physically active, tends his large garden and yard.   Pt Diabetic: no Pt smoker: former smoker, quit in the 1960's, smoked for about 20 years, wife smokes outside the house  Pt meds include: Statin : no, he had arthralgias to a statin, he takes an OTC "cholest-off"  ASA: yes Other anticoagulants/antiplatelets: Plavix   Past Medical History:  Diagnosis Date  . Allergy   . BPH (benign prostatic hypertrophy)   . Carotid artery occlusion   . Diverticulosis of colon   . ED (erectile dysfunction)   . GERD (gastroesophageal reflux disease)   . Glucose intolerance (impaired glucose tolerance)   . History of pancreatitis   . Hx-TIA (transient ischemic attack)   . Hyperlipidemia   . Hypertension   . Hypertention, malignant, with acute intensive management   . Low back pain   . Osteoarthritis   . PVD (peripheral vascular disease) ([place])    Bilateral carotid  . Tubular adenoma of colon 08/1991   One with carcinoma IN [place] 1993, multiple adenomatous    Social History Social History   Tobacco Use  . Smoking status: Former Smoker    Years: 20.00    Types: Cigarettes  . Smokeless tobacco: Never Used  . Tobacco comment: Daily caffeine use, regular exercise  Substance Use Topics  . Alcohol use: Yes    Alcohol/week: 4.2 - 8.4 oz    Types: 7 - 14 Standard drinks or equivalent per week    Comment: occ beer  . Drug use: No    Family History Family  History  Problem Relation Age of Onset  . Dementia Father   . Diabetes Mother   . Peripheral vascular disease Brother   . Colon cancer Sister        dx in her 57's    Surgical History Past Surgical History:  Procedure Laterality Date  . APPENDECTOMY    . BASAL CELL CARCINOMA EXCISION    . CHOLECYSTECTOMY    . COLONOSCOPY    . ESOPHAGOGASTRODUODENOSCOPY    . LEFT HEART CATH AND CORONARY ANGIOGRAPHY N/A 01/26/2017   Procedure: LEFT HEART CATH AND CORONARY ANGIOGRAPHY;  Surgeon: Lorretta Harp, MD;  Location: [facility];  Service: Cardiovascular;  Laterality: N/A;  . LUMBAR SPINE SURGERY     X4    Allergies  Allergen Reactions  . Amlodipine Other (See Comments)    Weakness in legs  . Livalo [Pitavastatin]     Pain, SOB  . Losartan Other (See Comments)    Leg numbness/stiffness.   . Oxycodone Other (See Comments)    crazy  . Pravastatin Sodium Other (See Comments)    REACTION: bad dreams  . Rosuvastatin Palpitations  . Statins Palpitations and Other (See Comments)    REACTION: aches, bad dreams    Current Outpatient Medications  Medication Sig Dispense Refill  . aspirin EC 81 MG tablet Take 81 mg by mouth daily.    . cholecalciferol (VITAMIN D) 1000 units tablet Take 1,000 Units by  mouth daily.    . clopidogrel (PLAVIX) 75 MG tablet Take 1 tablet (75 mg total) by mouth daily. 30 tablet 11  . cyanocobalamin 500 MCG tablet Take 500 mcg by mouth daily. Vitamin B12    . isosorbide mononitrate (IMDUR) 60 MG 24 hr tablet Take 60 mg by mouth daily.    Marland Kitchen losartan (COZAAR) 25 MG tablet Take 1 tablet (25 mg total) by mouth daily. 90 tablet 2  . metoprolol succinate (TOPROL XL) 25 MG 24 hr tablet Take 1 tablet (25 mg total) by mouth daily. 90 tablet 3  . Multiple Vitamins-Minerals (PRESERVISION AREDS 2) CAPS Take 1 capsule by mouth daily with supper.    . nitroGLYCERIN (NITROSTAT) 0.4 MG SL tablet Place 1 tablet (0.4 mg total) under the tongue every 5 (five) minutes as  needed for chest pain. 20 tablet 1  . Polyethyl Glycol-Propyl Glycol (SYSTANE OP) Place 1 drop into both eyes 4 (four) times daily as needed (dry eyes).    . ranitidine (ZANTAC) 150 MG tablet Take 150 mg by mouth daily.    . traMADol (ULTRAM) 50 MG tablet TAKE 1 TABLET EVERY 8 HOURS AS NEEDED 90 tablet 3  . zolpidem (AMBIEN) 5 MG tablet TAKE 1 TABLET BY MOUTH AT BEDTIME AS NEEDED FOR SLEEP 30 tablet 3  . ezetimibe (ZETIA) 10 MG tablet Take 1 tablet (10 mg total) by mouth daily. (Patient not taking: Reported on 07/03/2017) 30 tablet 11   No current facility-administered medications for this visit.     Review of Systems : See HPI for pertinent positives and negatives.  Physical Examination  Vitals:   07/03/17 1208 07/03/17 1211  BP: 132/76 135/82  Pulse: (!) 58 (!) 58  Resp: 18   Temp: 97.7 F (36.5 C)   TempSrc: Oral   SpO2: 96%   Weight: 199 lb (90.3 kg)   Height: 5\' 11"  (1.803 m)    Body mass index is 27.75 kg/m.  General: WDWN male in NAD GAIT:normal HENT: No apparent abnormalities  Eyes: PERRLA Pulmonary: Respirations are non-labored, CTAB, no rales, rhonchi, or wheezing. Cardiac: regular rhythm, no detected murmur.  VASCULAR EXAM Carotid Bruits Right Left   Negative Positive    Abdominal aortic pulse is not palpable. Radial pulses are 2+ palpable and equal.   LE Pulses Right Left  POPLITEAL not palpable not palpable  POSTERIOR TIBIAL 2+palpable 1+palpable  DORSALIS PEDIS ANTERIOR TIBIAL not palpable 1+ palpable    Gastrointestinal: soft, nontender, BS WNL, no r/g, no palpable masses. Musculoskeletal: no muscle atrophy/wasting. M/S 5/5 throughout, extremities without ischemic changes. 1+ pitting and non pitting edema in both ankles.  Skin: No rash, no cellulitis, no ulcers noted.  Neurologic: A&O X 3; appropriate affect, speech is normal, CN 2-12 intact, pain and light touch intact in extremities,  motor exam as listed above. Psychiatric: Normal thought content, mood appropriate to clinical situation.       Assessment: Richard Burgess is a 84 y.o. male who has no history of stroke or TIA. Fortunately he does not have DM.  His atherosclerotic risk factors include remote history of smoking.  He is statin intolerant, he takes a daily 81 mg ASA.    DATA Crotid Duplex (07/03/17):  60 - 79 % right ICA stenosis, and 1-39% left ICA stenosis. Left vertebral artery is antegrade, right vertebral artery was not visualized.  Bilateral subclavian artery waveforms are normal.  No significant change compared to the exams of 09-05-15, 03-10-16, and 11-10-16.   Plan:  Follow-up in 47monthswith Carotid Duplex scan.    I discussed in depth with the patient the nature of atherosclerosis, and emphasized the importance of maximal medical management including strict control of blood pressure, blood glucose, and lipid levels, obtaining regular exercise, and continued cessation of smoking.  The patient is aware that without maximal medical management the underlying atherosclerotic disease process will progress, limiting the benefit of any interventions. The patient was given information about stroke prevention and what symptoms should prompt the patient to seek immediate medical care. Thank you for allowing [place] to participate in this patient's care.  Clemon Chambers, RN, MSN, FNP-C [facility] Office: [facility] Physician: Bridgett Larsson  07/03/17 12:30 PM

## 2017-07-06 NOTE — Addendum Note (Signed)
Addended by: Lianne Cure A on: 07/06/2017 02:57 PM   Modules accepted: Orders

## 2017-08-06 ENCOUNTER — Ambulatory Visit (INDEPENDENT_AMBULATORY_CARE_PROVIDER_SITE_OTHER): Payer: PPO | Admitting: Cardiovascular Disease

## 2017-08-06 ENCOUNTER — Encounter: Payer: Self-pay | Admitting: Cardiovascular Disease

## 2017-08-06 VITALS — BP 99/55 | HR 74 | Ht 70.5 in | Wt 206.8 lb

## 2017-08-06 DIAGNOSIS — I2510 Atherosclerotic heart disease of native coronary artery without angina pectoris: Secondary | ICD-10-CM | POA: Diagnosis not present

## 2017-08-06 DIAGNOSIS — I6523 Occlusion and stenosis of bilateral carotid arteries: Secondary | ICD-10-CM

## 2017-08-06 DIAGNOSIS — E785 Hyperlipidemia, unspecified: Secondary | ICD-10-CM

## 2017-08-06 DIAGNOSIS — I10 Essential (primary) hypertension: Secondary | ICD-10-CM | POA: Diagnosis not present
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
## 2017-08-06 NOTE — Patient Instructions (Signed)
Medication Instructions:  Your physician recommends that you continue on your current medications as directed. Please refer to the Current Medication list given to you today.  Labwork: NONE  Testing/Procedures: NONE  Follow-Up: Your physician recommends that you schedule a follow-up appointment in: 4 MONTH OV  If you need a refill on your cardiac medications before your next appointment, please call your pharmacy.   

## 2017-09-22 ENCOUNTER — Encounter: Payer: Self-pay | Admitting: Internal Medicine

## 2017-09-22 ENCOUNTER — Ambulatory Visit (INDEPENDENT_AMBULATORY_CARE_PROVIDER_SITE_OTHER): Payer: PPO | Admitting: Internal Medicine

## 2017-09-22 DIAGNOSIS — K219 Gastro-esophageal reflux disease without esophagitis: Secondary | ICD-10-CM

## 2017-09-22 DIAGNOSIS — M5440 Lumbago with sciatica, unspecified side: Secondary | ICD-10-CM

## 2017-09-22 DIAGNOSIS — I10 Essential (primary) hypertension: Secondary | ICD-10-CM

## 2017-09-22 DIAGNOSIS — R634 Abnormal weight loss: Secondary | ICD-10-CM | POA: Diagnosis not present

## 2017-09-22 DIAGNOSIS — E785 Hyperlipidemia, unspecified: Secondary | ICD-10-CM

## 2017-09-22 DIAGNOSIS — G8929 Other chronic pain: Secondary | ICD-10-CM

## 2017-09-22 DIAGNOSIS — L570 Actinic keratosis: Secondary | ICD-10-CM | POA: Diagnosis not present

## 2017-09-22 DIAGNOSIS — E538 Deficiency of other specified B group vitamins: Secondary | ICD-10-CM | POA: Diagnosis not present

## 2017-09-22 NOTE — Patient Instructions (Signed)
MC well w/Jill 

## 2017-09-22 NOTE — Assessment & Plan Note (Signed)
On B12 

## 2017-09-22 NOTE — Assessment & Plan Note (Signed)
Zetia

## 2017-09-22 NOTE — Progress Notes (Signed)
Subjective:  Patient ID: Richard Burgess, male    DOB: 10/22/1928  Age: 84 y.o. MRN: [id number]  CC: No chief complaint on file.   HPI Richard Burgess presents for HTN, OA, CAD f/u. Had Labs/OV at the [place]. He also saw a skin doctor at the [facility]  Outpatient Medications Prior to Visit  Medication Sig Dispense Refill  . aspirin EC 81 MG tablet Take 81 mg by mouth daily.    . cholecalciferol (VITAMIN D) 1000 units tablet Take 1,000 Units by mouth daily.    . clopidogrel (PLAVIX) 75 MG tablet Take 1 tablet (75 mg total) by mouth daily. 30 tablet 11  . cyanocobalamin 500 MCG tablet Take 500 mcg by mouth daily. Vitamin B12    . isosorbide mononitrate (IMDUR) 60 MG 24 hr tablet Take 60 mg by mouth daily.    Marland Kitchen losartan (COZAAR) 25 MG tablet Take 1 tablet (25 mg total) by mouth daily. 90 tablet 2  . metoprolol succinate (TOPROL XL) 25 MG 24 hr tablet Take 1 tablet (25 mg total) by mouth daily. 90 tablet 3  . Multiple Vitamins-Minerals (PRESERVISION AREDS 2) CAPS Take 1 capsule by mouth daily with supper.    . nitroGLYCERIN (NITROSTAT) 0.4 MG SL tablet Place 1 tablet (0.4 mg total) under the tongue every 5 (five) minutes as needed for chest pain. 20 tablet 1  . Plant Sterol Stanol-Pantethine (CHOLESTOFF COMPLETE PO) Take 1 capsule by mouth daily.    Richard Burgess Glycol-Propyl Glycol (SYSTANE OP) Place 1 drop into both eyes 4 (four) times daily as needed (dry eyes).    . ranitidine (ZANTAC) 150 MG tablet Take 150 mg by mouth daily.    . traMADol (ULTRAM) 50 MG tablet TAKE 1 TABLET EVERY 8 HOURS AS NEEDED 90 tablet 3  . traZODone (DESYREL) 150 MG tablet Take 150 mg by mouth at bedtime.     No facility-administered medications prior to visit.     ROS Review of Systems  Constitutional: Negative for appetite change, fatigue and unexpected weight change.  HENT: Negative for congestion, nosebleeds, sneezing, sore throat and trouble swallowing.   Eyes: Negative for itching and visual disturbance.    Respiratory: Negative for cough.   Cardiovascular: Negative for chest pain, palpitations and leg swelling.  Gastrointestinal: Negative for abdominal distention, blood in stool, diarrhea and nausea.  Genitourinary: Negative for frequency and hematuria.  Musculoskeletal: Positive for back pain. Negative for gait problem, joint swelling and neck pain.  Skin: Negative for rash.  Neurological: Negative for dizziness, tremors, speech difficulty and weakness.  Psychiatric/Behavioral: Negative for agitation, dysphoric mood and sleep disturbance. The patient is not nervous/anxious.     Objective:  BP 118/60 (BP Location: Left Arm, Patient Position: Sitting, Cuff Size: Large)   Pulse 69   Temp 98.4 F (36.9 C) (Oral)   Ht 5' 10.5" (1.791 m)   Wt 202 lb (91.6 kg)   SpO2 99%   BMI 28.57 kg/m   BP Readings from Last 3 Encounters:  09/22/17 118/60  08/06/17 (!) 99/55  07/03/17 135/82    Wt Readings from Last 3 Encounters:  09/22/17 202 lb (91.6 kg)  08/06/17 206 lb 12.8 oz (93.8 kg)  07/03/17 199 lb (90.3 kg)    Physical Exam  Constitutional: He is oriented to person, place, and time. He appears well-developed. No distress.  NAD  HENT:  Mouth/Throat: Oropharynx is clear and moist.  Eyes: Pupils are equal, round, and reactive to light. Conjunctivae are normal.  Neck: Normal range of motion. No JVD present. No thyromegaly present.  Cardiovascular: Normal rate, regular rhythm, normal heart sounds and intact distal pulses. Exam reveals no gallop and no friction rub.  No murmur heard. Pulmonary/Chest: Effort normal and breath sounds normal. No respiratory distress. He has no wheezes. He has no rales. He exhibits no tenderness.  Abdominal: Soft. Bowel sounds are normal. He exhibits no distension and no mass. There is no tenderness. There is no rebound and no guarding.  Musculoskeletal: Normal range of motion. He exhibits no edema or tenderness.  Lymphadenopathy:    He has no cervical  adenopathy.  Neurological: He is alert and oriented to person, place, and time. He has normal reflexes. No cranial nerve deficit. He exhibits normal muscle tone. He displays a negative Romberg sign. Coordination and gait normal.  Skin: Skin is warm and dry. No rash noted.  Psychiatric: He has a normal mood and affect. His behavior is normal. Judgment and thought content normal.    Lab Results  Component Value Date   WBC 5.6 04/10/2017   HGB 12.4 (L) 04/10/2017   HCT 37.1 (L) 04/10/2017   PLT 180 04/10/2017   GLUCOSE 118 (H) 05/11/2017   CHOL 194 05/11/2017   TRIG 98 05/11/2017   HDL 48 05/11/2017   LDLDIRECT 153.0 02/18/2016   LDLCALC 126 (H) 05/11/2017   ALT 17 05/11/2017   AST 16 05/11/2017   NA 139 05/11/2017   K 4.8 05/11/2017   CL 100 05/11/2017   CREATININE 1.11 05/11/2017   BUN 14 05/11/2017   CO2 23 05/11/2017   TSH 2.88 02/18/2016   PSA 0.28 03/23/2014   INR 0.97 04/10/2017   HGBA1C 5.9 (H) 01/25/2017   MICROALBUR 0.2 12/13/2007    No results found.  Assessment & Plan:   There are no diagnoses linked to this encounter. I am having Richard Burgess maintain his nitroGLYCERIN, cholecalciferol, cyanocobalamin, ranitidine, aspirin EC, PRESERVISION AREDS 2, Polyethyl Glycol-Propyl Glycol (SYSTANE OP), clopidogrel, metoprolol succinate, traMADol, isosorbide mononitrate, losartan, Plant Sterol Stanol-Pantethine (CHOLESTOFF COMPLETE PO), and traZODone.  No orders of the defined types were placed in this encounter.    Follow-up: No follow-ups on file.  [name], MD

## 2017-09-22 NOTE — Assessment & Plan Note (Signed)
Wt Readings from Last 3 Encounters:  09/22/17 202 lb (91.6 kg)  08/06/17 206 lb 12.8 oz (93.8 kg)  07/03/17 199 lb (90.3 kg)  monitoring

## 2017-09-22 NOTE — Assessment & Plan Note (Signed)
Tylenol or Tramadol prn or nothing on some days

## 2017-09-22 NOTE — Assessment & Plan Note (Signed)
Losartan, Toprol, Isosorbide

## 2017-09-22 NOTE — Assessment & Plan Note (Signed)
Protonix.  ?

## 2017-09-22 NOTE — Assessment & Plan Note (Signed)
He also saw a skin doctor at the [place]

## 2017-12-04 ENCOUNTER — Encounter: Payer: Self-pay | Admitting: Cardiovascular Disease

## 2017-12-04 ENCOUNTER — Ambulatory Visit (INDEPENDENT_AMBULATORY_CARE_PROVIDER_SITE_OTHER): Payer: PPO | Admitting: Cardiovascular Disease

## 2017-12-04 VITALS — BP 114/63 | HR 60 | Ht 71.0 in | Wt 210.0 lb

## 2017-12-04 DIAGNOSIS — Z789 Other specified health status: Secondary | ICD-10-CM | POA: Diagnosis not present

## 2017-12-04 DIAGNOSIS — E785 Hyperlipidemia, unspecified: Secondary | ICD-10-CM

## 2017-12-04 DIAGNOSIS — I2510 Atherosclerotic heart disease of native coronary artery without angina pectoris: Secondary | ICD-10-CM

## 2017-12-04 DIAGNOSIS — I5042 Chronic combined systolic (congestive) and diastolic (congestive) heart failure: Secondary | ICD-10-CM | POA: Diagnosis not present

## 2017-12-04 DIAGNOSIS — I10 Essential (primary) hypertension: Secondary | ICD-10-CM

## 2017-12-04 MED ORDER — METOPROLOL SUCCINATE ER 25 MG PO TB24: 25.0000 mg | ORAL_TABLET | Freq: Every day | ORAL | 3 refills | Status: DC

## 2017-12-04 NOTE — Progress Notes (Signed)
Cardiology Office Note   Date:  12/04/2017   ID:  Richard Burgess, DOB [birth year], MRN [id number]  PCP:  Cassandria Anger, MD  Cardiologist:   [name], MD   No chief complaint on file.   History of Present Illness: Richard Burgess is an 84 y.o. male with chronic systolic and diastolic heart failure, hypertension, hyperlipidemia, aortic aneurysm, carotid stenosis, R subclavian steal, who presents for follow up.  Richard Burgess presented to the ED with NSTEMI 01/2017.  He underwent left heart catheterization 01/2017 and was noted to have 3v CAD (100% RCA, 40% OM, 60% ostial LAD, and 80% OM1.  LVEF was 45-50%.  He had L-->R collaterals to the LAD.  Medical management was recommended.  He was started on clopidogrel with plans for 12 months of DAPT.  He had a history of statin intolerance.  He was started on Zetia in the hospital.  LDL was 94 he was referred to lipid clinic and prescribed Repatha.   He reported exertional angina and was started on isosorbide.    At his last appointment Richard Burgess was doing well.  He hadn't started Repatha but was willing to start this summer.  He is willing to try a PCSK9 inhibitor at this time.  Since that time he is continued to do well.  He remains very active in his large garden.  He has no exertional chest pain or shortness of breath.  He also denies lower extremity edema, orthopnea, or PND.  However, his daughter notes that he gets edema after sitting for long periods of time.  She reports that he has a heavy salt intake.  He gets all his medications from the [facility].  They have been sending him metoprolol tartrate 50 mg to be taken twice daily.  His wife has been giving him 1/2 tablet daily.     Past Medical History:  Diagnosis Date  . Allergy   . BPH (benign prostatic hypertrophy)   . Carotid artery occlusion   . Diverticulosis of colon   . ED (erectile dysfunction)   . GERD (gastroesophageal reflux disease)   . Glucose  intolerance (impaired glucose tolerance)   . History of pancreatitis   . Hx-TIA (transient ischemic attack)   . Hyperlipidemia   . Hypertension   . Hypertention, malignant, with acute intensive management   . Low back pain   . Osteoarthritis   . PVD (peripheral vascular disease) ([place])    Bilateral carotid  . Tubular adenoma of colon 08/1991   One with carcinoma IN [place] 1993, multiple adenomatous    Past Surgical History:  Procedure Laterality Date  . APPENDECTOMY    . BASAL CELL CARCINOMA EXCISION    . CHOLECYSTECTOMY    . COLONOSCOPY    . ESOPHAGOGASTRODUODENOSCOPY    . LEFT HEART CATH AND CORONARY ANGIOGRAPHY N/A 01/26/2017   Procedure: LEFT HEART CATH AND CORONARY ANGIOGRAPHY;  Surgeon: Lorretta Harp, MD;  Location: [facility];  Service: Cardiovascular;  Laterality: N/A;  . LUMBAR SPINE SURGERY     X4     Current Outpatient Medications  Medication Sig Dispense Refill  . aspirin EC 81 MG tablet Take 81 mg by mouth daily.    . cholecalciferol (VITAMIN D) 1000 units tablet Take 1,000 Units by mouth daily.    . clopidogrel (PLAVIX) 75 MG tablet Take 1 tablet (75 mg total) by mouth daily. 30 tablet 11  . cyanocobalamin 500 MCG tablet Take 500 mcg  by mouth daily. Vitamin B12    . isosorbide mononitrate (IMDUR) 60 MG 24 hr tablet Take 60 mg by mouth daily.    Marland Kitchen losartan (COZAAR) 25 MG tablet Take 1 tablet (25 mg total) by mouth daily. 90 tablet 2  . metoprolol succinate (TOPROL XL) 25 MG 24 hr tablet Take 1 tablet (25 mg total) by mouth daily. 90 tablet 3  . Multiple Vitamins-Minerals (PRESERVISION AREDS 2) CAPS Take 1 capsule by mouth daily with supper.    . nitroGLYCERIN (NITROSTAT) 0.4 MG SL tablet Place 1 tablet (0.4 mg total) under the tongue every 5 (five) minutes as needed for chest pain. 20 tablet 1  . Plant Sterol Stanol-Pantethine (CHOLESTOFF COMPLETE PO) Take 1 capsule by mouth daily.    Vladimir Faster Glycol-Propyl Glycol (SYSTANE OP) Place 1 drop into both eyes  4 (four) times daily as needed (dry eyes).    . ranitidine (ZANTAC) 150 MG tablet Take 150 mg by mouth daily.    . traMADol (ULTRAM) 50 MG tablet TAKE 1 TABLET EVERY 8 HOURS AS NEEDED 90 tablet 3  . traZODone (DESYREL) 150 MG tablet Take 150 mg by mouth at bedtime.     No current facility-administered medications for this visit.     Allergies:   Amlodipine; Livalo [pitavastatin]; Losartan; Oxycodone; Pravastatin sodium; Zetia [ezetimibe]; Rosuvastatin; and Statins    Social History:  The patient  reports that he has quit smoking. His smoking use included cigarettes. He quit after 20.00 years of use. He has never used smokeless tobacco. He reports that he drinks about 4.2 - 8.4 oz of alcohol per week. He reports that he does not use drugs.   Family History:  The patient's family history includes Colon cancer in his sister; Dementia in his father; Diabetes in his mother; Peripheral vascular disease in his brother.    ROS:  Please see the history of present illness.   Otherwise, review of systems are positive for none.   All other systems are reviewed and negative.    PHYSICAL EXAM: VS:  BP 114/63   Pulse 60   Ht 5\' 11"  (1.803 m)   Wt 210 lb (95.3 kg)   BMI 29.29 kg/m  , BMI Body mass index is 29.29 kg/m. GENERAL:  Well appearing HEENT: Pupils equal round and reactive, fundi not visualized, oral mucosa unremarkable NECK:  No jugular venous distention, waveform within normal limits, carotid upstroke brisk and symmetric, no bruits LUNGS:  Clear to auscultation bilaterally HEART:  RRR.  PMI not displaced or sustained,S1 and S2 within normal limits, no S3, no S4, no clicks, no rubs, no murmurs ABD:  Flat, positive bowel sounds normal in frequency in pitch, no bruits, no rebound, no guarding, no midline pulsatile mass, no hepatomegaly, no splenomegaly EXT:  2 plus pulses throughout, 1+ LE edema to above the ankle bilaterally, no cyanosis no clubbing SKIN:  No rashes no nodules NEURO:   Cranial nerves II through XII grossly intact, motor grossly intact throughout PSYCH:  Cognitively intact, oriented to person place and time   EKG:  EKG is ordered today. 12/04/17: Sinus rhythm.  Rate 60 bpm.  RBBB.   LEFT HEART CATH AND CORONARY ANGIOGRAPHY 01/26/17  Conclusion   Prox RCA to Mid RCA lesion, 100 %stenosed.  Ost LM lesion, 40 %stenosed.  Ost LAD lesion, 60 %stenosed.  1st Mrg lesion, 80 %stenosed.  There is mild to moderate left ventricular systolic dysfunction.  LV end diastolic pressure is mildly elevated.  The left  ventricular ejection fraction is 45-50% by visual estimate.    Recent Labs: 04/10/2017: B Natriuretic Peptide 94.9; Hemoglobin 12.4; Platelets 180 05/11/2017: ALT 17; BUN 14; Creatinine, Ser 1.11; Potassium 4.8; Sodium 139    Lipid Panel    Component Value Date/Time   CHOL 194 05/11/2017 1018   TRIG 98 05/11/2017 1018   HDL 48 05/11/2017 1018   CHOLHDL 4.0 05/11/2017 1018   CHOLHDL 4.6 01/26/2017 0900   VLDL 30 01/26/2017 0900   LDLCALC 126 (H) 05/11/2017 1018   LDLDIRECT 153.0 02/18/2016 0826      Wt Readings from Last 3 Encounters:  12/04/17 210 lb (95.3 kg)  09/22/17 202 lb (91.6 kg)  08/06/17 206 lb 12.8 oz (93.8 kg)      ASSESSMENT AND PLAN:  # Obstructive CAD: Mr. Thoma has obstructive CAD that we are medically managing. No recurrent angina since starting Imur.  Continue aspirin, clopidogrel, isosorbide, and metoprolol. He will stop clopidogrel 01/2018.  # Chronic systolic and diastolic heart failure:  LVEF 45-50% on LV gram.  He is mildly volume overloaded.  His family notes that he  Uses a lot of salt.  He will work on reducing his salt intake.  He will also wear compression stockings when sitting for long periods of time and try to elevate his legs when possible.  We will give him Lasix 20 mg to take as needed.  # Hyperlipidemia: Statin intolerant.  He also did not tolerate Zetia.  For now he is taking an over-the-counter  medicine called Cholest-off.  He will start [place] now.  We will have him seen in the Lipid Clinic.    Current medicines are reviewed at length with the patient today.  The patient does not have concerns regarding medicines.  The following changes have been made:  None  Labs/ tests ordered today include:   Orders Placed This Encounter  Procedures  . EKG 12-Lead     Disposition:   FU with Vinayak Bobier C. Oval Linsey, MD, [facility] in 6 months.      Signed, Sonna Lipsky C. Oval Linsey, MD, [facility]  12/04/2017 10:46 AM    [place]

## 2017-12-04 NOTE — Patient Instructions (Signed)
Medication Instructions:  Ok to stop Plavix in [place]  Follow-Up: With pharmacist to discuss Repatha  6 months with Dr. Oval Linsey  Any Other Special Instructions Will Be Listed Below (If Applicable).     If you need a refill on your cardiac medications before your next appointment, please call your pharmacy.

## 2017-12-08 ENCOUNTER — Encounter: Payer: Self-pay | Admitting: Pharmacist Clinician (PhC)/ Clinical Pharmacy Specialist

## 2017-12-08 ENCOUNTER — Ambulatory Visit (INDEPENDENT_AMBULATORY_CARE_PROVIDER_SITE_OTHER): Payer: PPO | Admitting: Pharmacist Clinician (PhC)/ Clinical Pharmacy Specialist

## 2017-12-08 DIAGNOSIS — E785 Hyperlipidemia, unspecified: Secondary | ICD-10-CM

## 2017-12-08 LAB — HEPATIC FUNCTION PANEL
ALBUMIN: 4.7 g/dL (ref 3.5–4.7)
ALK PHOS: 60 IU/L (ref 39–117)
ALT: 17 IU/L (ref 0–44)
AST: 13 IU/L (ref 0–40)
Bilirubin Total: 0.8 mg/dL (ref 0.0–1.2)
Bilirubin, Direct: 0.17 mg/dL (ref 0.00–0.40)
TOTAL PROTEIN: 6.8 g/dL (ref 6.0–8.5)

## 2017-12-08 LAB — LIPID PANEL
CHOL/HDL RATIO: 5.5 ratio — AB (ref 0.0–5.0)
Cholesterol, Total: 219 mg/dL — ABNORMAL HIGH (ref 100–199)
HDL: 40 mg/dL (ref >39)
LDL CALC: 145 mg/dL — AB (ref 0–99)
Triglycerides: 169 mg/dL — ABNORMAL HIGH (ref 0–149)
VLDL Cholesterol Cal: 34 mg/dL (ref 5–40)

## 2017-12-08 NOTE — Patient Instructions (Signed)
We will start the process to get Repatha covered for you.     Get labs today for your cholesterol  You will hear from [place] in about 7-21 days with information about the medication.  Evolocumab injection What is this medicine? EVOLOCUMAB (e voe LOK ue mab) is known as a PCSK9 inhibitor. It is used to lower the level of cholesterol in the blood. It may be used alone or in combination with other cholesterol-lowering drugs. This drug may also be used to reduce the risk of heart attack, stroke, and certain types of heart surgery in patients with heart disease. This medicine may be used for other purposes; ask your health care provider or pharmacist if you have questions. COMMON BRAND NAME(S): REPATHA What should I tell my health care provider before I take this medicine? They need to know if you have any of these conditions: -an unusual or allergic reaction to evolocumab, other medicines, foods, dyes, or preservatives -pregnant or trying to get pregnant -breast-feeding How should I use this medicine? This medicine is for injection under the skin. You will be taught how to prepare and give this medicine. Use exactly as directed. Take your medicine at regular intervals. Do not take your medicine more often than directed. It is important that you put your used needles and syringes in a special sharps container. Do not put them in a trash can. If you do not have a sharps container, call your pharmacist or health care provider to get one. Talk to your pediatrician regarding the use of this medicine in children. While this drug may be prescribed for children as young as 13 years for selected conditions, precautions do apply. Overdosage: If you think you have taken too much of this medicine contact a poison control center or emergency room at once. NOTE: This medicine is only for you. Do not share this medicine with others. What if I miss a dose? If you miss a dose, take it as soon as you can if there are  more than 7 days until the next scheduled dose, or skip the missed dose and take the next dose according to your original schedule. Do not take double or extra doses. What may interact with this medicine? Interactions are not expected. This list may not describe all possible interactions. Give your health care provider a list of all the medicines, herbs, non-prescription drugs, or dietary supplements you use. Also tell them if you smoke, drink alcohol, or use illegal drugs. Some items may interact with your medicine. What should I watch for while using this medicine? You may need blood work while you are taking this medicine. What side effects may I notice from receiving this medicine? Side effects that you should report to your doctor or health care professional as soon as possible: -allergic reactions like skin rash, itching or hives, swelling of the face, lips, or tongue -signs and symptoms of infection like fever or chills; cough; sore throat; pain or trouble passing urine Side effects that usually do not require medical attention (report to your doctor or health care professional if they continue or are bothersome): -diarrhea -nausea -muscle pain -pain, redness, or irritation at site where injected This list may not describe all possible side effects. Call your doctor for medical advice about side effects. You may report side effects to FDA at 1-800-FDA-1088. Where should I keep my medicine? Keep out of the reach of children. You will be instructed on how to store this medicine. Throw away any  unused medicine after the expiration date on the label. NOTE: This sheet is a summary. It may not cover all possible information. If you have questions about this medicine, talk to your doctor, pharmacist, or health care provider.  2018 Elsevier/Gold Standard (2016-05-12 13:21:53)

## 2017-12-08 NOTE — Assessment & Plan Note (Signed)
Patient with hyperlipidemia, intolerant to multiple statin drugs.   Will start paperwork to get Repatha covered.   Patient would prefer the [employer] system, but will use the pen if he has to.  Cost is a concern for him, so we had him fill out the paperwork for the [employer] program.   He was encouraged to stay active and continue with a mostly healthy diet.  Will repeat labs after 5th or 6th dose.

## 2017-12-08 NOTE — Progress Notes (Signed)
12/08/2017 [name] Jun 29, 1928 [id number]   HPI:  Richard Burgess is a 84 y.o. male patient of Dr Oval Linsey, who presents today for a lipid clinic evaluation.  In addition to hyperlipidemia, his medical history is significant for chronic systolic/diastolic heart failure, CAD, NSTEMI,  hypertension, aortic aneurysm, carotid stenosis and subclavian steal.  His NSTEMI was in August of 2018 and a heart catheterization showed 3 vessel disease (RCA 100%, OM 40%, ostial LAD 60% and OM1 80%).   He has a history of statin intolerance and was previously recommended that he start [place].  He was not interested at that time, but is willing to start now.    Current Medications:  None  Cholesterol Goals:   LDL < 70  Intolerant/previously tried:  Pravastatin, rosuvastatin, pitavastatin  Family history:   Father 28 - old age, atherosclerosis  Mother 45- stomach issues  1 sister with cancer  1 sister with heart failure (died at 6)  3 children, all with elevated cholesterol levels  Diet:   Mostly home cooked, adds salt; enjoys fried eggs as well as fried vegetables.     Exercise:    No regular exercise, stays active all day, works in his yard and garden  Labs:   05/2017:  TC 194, TG 98, HDL 48, LDL 126  Current Outpatient Medications  Medication Sig Dispense Refill  . aspirin EC 81 MG tablet Take 81 mg by mouth daily.    . cholecalciferol (VITAMIN D) 1000 units tablet Take 1,000 Units by mouth daily.    . clopidogrel (PLAVIX) 75 MG tablet Take 1 tablet (75 mg total) by mouth daily. 30 tablet 11  . cyanocobalamin 500 MCG tablet Take 500 mcg by mouth daily. Vitamin B12    . isosorbide mononitrate (IMDUR) 60 MG 24 hr tablet Take 60 mg by mouth daily.    Marland Kitchen losartan (COZAAR) 25 MG tablet Take 1 tablet (25 mg total) by mouth daily. 90 tablet 2  . metoprolol succinate (TOPROL XL) 25 MG 24 hr tablet Take 1 tablet (25 mg total) by mouth daily. 90 tablet 3  . Multiple Vitamins-Minerals (PRESERVISION  AREDS 2) CAPS Take 1 capsule by mouth daily with supper.    . nitroGLYCERIN (NITROSTAT) 0.4 MG SL tablet Place 1 tablet (0.4 mg total) under the tongue every 5 (five) minutes as needed for chest pain. 20 tablet 1  . Plant Sterol Stanol-Pantethine (CHOLESTOFF COMPLETE PO) Take 1 capsule by mouth daily.    Vladimir Faster Glycol-Propyl Glycol (SYSTANE OP) Place 1 drop into both eyes 4 (four) times daily as needed (dry eyes).    . ranitidine (ZANTAC) 150 MG tablet Take 150 mg by mouth daily.    . traMADol (ULTRAM) 50 MG tablet TAKE 1 TABLET EVERY 8 HOURS AS NEEDED 90 tablet 3  . traZODone (DESYREL) 150 MG tablet Take 150 mg by mouth at bedtime.     No current facility-administered medications for this visit.     Allergies  Allergen Reactions  . Amlodipine Other (See Comments)    Weakness in legs  . Livalo [Pitavastatin]     Pain, SOB  . Losartan Other (See Comments)    Leg numbness/stiffness.   . Oxycodone Other (See Comments)    crazy  . Pravastatin Sodium Other (See Comments)    REACTION: bad dreams  . Zetia [Ezetimibe]     LEG PAIN   . Rosuvastatin Palpitations  . Statins Palpitations and Other (See Comments)    REACTION: aches, bad dreams  Past Medical History:  Diagnosis Date  . Allergy   . BPH (benign prostatic hypertrophy)   . Carotid artery occlusion   . Diverticulosis of colon   . ED (erectile dysfunction)   . GERD (gastroesophageal reflux disease)   . Glucose intolerance (impaired glucose tolerance)   . History of pancreatitis   . Hx-TIA (transient ischemic attack)   . Hyperlipidemia   . Hypertension   . Hypertention, malignant, with acute intensive management   . Low back pain   . Osteoarthritis   . PVD (peripheral vascular disease) ([place])    Bilateral carotid  . Tubular adenoma of colon 08/1991   One with carcinoma IN [place] 1993, multiple adenomatous    There were no vitals taken for this visit.   Dyslipidemia Patient with hyperlipidemia, intolerant to  multiple statin drugs.   Will start paperwork to get Repatha covered.   Patient would prefer the [employer] system, but will use the pen if he has to.  Cost is a concern for him, so we had him fill out the paperwork for the [employer] program.   He was encouraged to stay active and continue with a mostly healthy diet.  Will repeat labs after 5th or 6th dose.     Tommy Medal PharmD CPP [facility]

## 2017-12-15 ENCOUNTER — Ambulatory Visit: Payer: PPO | Admitting: Internal Medicine

## 2017-12-31 ENCOUNTER — Telehealth: Payer: Self-pay | Admitting: Pharmacist Clinician (PhC)/ Clinical Pharmacy Specialist

## 2017-12-31 ENCOUNTER — Other Ambulatory Visit: Payer: Self-pay | Admitting: Pharmacist Clinician (PhC)/ Clinical Pharmacy Specialist

## 2017-12-31 MED ORDER — EVOLOCUMAB WITH INFUSOR 420 MG/3.5ML ~~LOC~~ SOCT: 420.0000 mg | SUBCUTANEOUS | 12 refills | Status: DC

## 2017-12-31 NOTE — Telephone Encounter (Signed)
Repatha 420 mg approved to 12-11-18.  Patient to contact [facility] for pricing.  Will call if cost prohibitive.

## 2017-12-31 NOTE — Telephone Encounter (Signed)
Follow Up:     Pharmacist says they are unable to get pt's Evolocumab.

## 2017-12-31 NOTE — Telephone Encounter (Signed)
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  Rx sent to [facility]

## 2018-01-04 ENCOUNTER — Encounter: Payer: Self-pay | Admitting: Family

## 2018-01-04 ENCOUNTER — Ambulatory Visit (HOSPITAL_COMMUNITY)
Admission: RE | Admit: 2018-01-04 | Discharge: 2018-01-04 | Disposition: A | Discharge status: Home / Self Care | Payer: PPO | Source: Ambulatory Visit | Attending: Vascular Surgery | Admitting: Vascular Surgery

## 2018-01-04 ENCOUNTER — Ambulatory Visit: Payer: PPO | Admitting: Family

## 2018-01-04 VITALS — BP 134/70 | HR 53 | Temp 97.8°F | Resp 18 | Ht 71.0 in | Wt 201.0 lb

## 2018-01-04 DIAGNOSIS — I10 Essential (primary) hypertension: Secondary | ICD-10-CM | POA: Diagnosis not present

## 2018-01-04 DIAGNOSIS — Z87891 Personal history of nicotine dependence: Secondary | ICD-10-CM

## 2018-01-04 DIAGNOSIS — E785 Hyperlipidemia, unspecified: Secondary | ICD-10-CM | POA: Diagnosis not present

## 2018-01-04 DIAGNOSIS — I739 Peripheral vascular disease, unspecified: Secondary | ICD-10-CM | POA: Diagnosis not present

## 2018-01-04 DIAGNOSIS — I6523 Occlusion and stenosis of bilateral carotid arteries: Secondary | ICD-10-CM | POA: Diagnosis not present

## 2018-01-04 NOTE — Progress Notes (Signed)
Chief Complaint: Follow up Extracranial Carotid Artery Stenosis   History of Present Illness  Richard Burgess is a 84 y.o. male whom Dr. Trula Slade has been monitoring and is back today for follow-up of his extracranial carotid artery occlusive disease.   The patient denies any history of TIA or stroke symptoms, specifically he denies a history of amaurosis fugax or monocular blindness, unilateral facial drooping, hemiplegia,orreceptive or expressive aphasia.   Hedenies claudication type symptoms with walking. He stays physically active, tends his large garden and yard.  Pt Diabetic: no Pt smoker: former smoker, quit in the 1960's, smoked for about 20 years, wife smokes outside the house  Pt meds include: Statin : no, he had arthralgias to a statin, he takes an OTC "cholest-off" Daughter states he will be receiving a PCSK9 inhibitor. injectable to lower LDL.  ASA: yes Other anticoagulants/antiplatelets: Plavix will be stopped 01-07-18, as advised by his cardiologist, Dr. [name], per pt.     Past Medical History:  Diagnosis Date  . Allergy   . BPH (benign prostatic hypertrophy)   . Carotid artery occlusion   . Diverticulosis of colon   . ED (erectile dysfunction)   . GERD (gastroesophageal reflux disease)   . Glucose intolerance (impaired glucose tolerance)   . History of pancreatitis   . Hx-TIA (transient ischemic attack)   . Hyperlipidemia   . Hypertension   . Hypertention, malignant, with acute intensive management   . Low back pain   . Osteoarthritis   . PVD (peripheral vascular disease) ([place])    Bilateral carotid  . Tubular adenoma of colon 08/1991   One with carcinoma IN [place] 1993, multiple adenomatous    Social History Social History   Tobacco Use  . Smoking status: Former Smoker    Years: 20.00    Types: Cigarettes  . Smokeless tobacco: Never Used  . Tobacco comment: Daily caffeine use, regular exercise  Substance Use Topics  . Alcohol  use: Yes    Alcohol/week: 4.2 - 8.4 oz    Types: 7 - 14 Standard drinks or equivalent per week    Comment: occ beer  . Drug use: No    Family History Family History  Problem Relation Age of Onset  . Dementia Father   . Diabetes Mother   . Peripheral vascular disease Brother   . Colon cancer Sister        dx in her 63's    Surgical History Past Surgical History:  Procedure Laterality Date  . APPENDECTOMY    . BASAL CELL CARCINOMA EXCISION    . CHOLECYSTECTOMY    . COLONOSCOPY    . ESOPHAGOGASTRODUODENOSCOPY    . LEFT HEART CATH AND CORONARY ANGIOGRAPHY N/A 01/26/2017   Procedure: LEFT HEART CATH AND CORONARY ANGIOGRAPHY;  Surgeon: Lorretta Harp, MD;  Location: [facility];  Service: Cardiovascular;  Laterality: N/A;  . LUMBAR SPINE SURGERY     X4    Allergies  Allergen Reactions  . Amlodipine Other (See Comments)    Weakness in legs  . Livalo [Pitavastatin]     Pain, SOB  . Losartan Other (See Comments)    Leg numbness/stiffness.   . Oxycodone Other (See Comments)    crazy  . Pravastatin Sodium Other (See Comments)    REACTION: bad dreams  . Zetia [Ezetimibe]     LEG PAIN   . Rosuvastatin Palpitations  . Statins Palpitations and Other (See Comments)    REACTION: aches, bad dreams  Current Outpatient Medications  Medication Sig Dispense Refill  . aspirin EC 81 MG tablet Take 81 mg by mouth daily.    . cholecalciferol (VITAMIN D) 1000 units tablet Take 1,000 Units by mouth daily.    . clopidogrel (PLAVIX) 75 MG tablet Take 1 tablet (75 mg total) by mouth daily. 30 tablet 11  . cyanocobalamin 500 MCG tablet Take 500 mcg by mouth daily. Vitamin B12    . Evolocumab with Infusor ([place]) 420 MG/3.5ML SOCT Inject 420 mg into the skin every 30 (thirty) days. 1 Cartridge 12  . isosorbide mononitrate (IMDUR) 60 MG 24 hr tablet Take 60 mg by mouth daily.    Marland Kitchen losartan (COZAAR) 25 MG tablet Take 1 tablet (25 mg total) by mouth daily. 90  tablet 2  . metoprolol succinate (TOPROL XL) 25 MG 24 hr tablet Take 1 tablet (25 mg total) by mouth daily. 90 tablet 3  . Multiple Vitamins-Minerals (PRESERVISION AREDS 2) CAPS Take 1 capsule by mouth daily with supper.    . nitroGLYCERIN (NITROSTAT) 0.4 MG SL tablet Place 1 tablet (0.4 mg total) under the tongue every 5 (five) minutes as needed for chest pain. 20 tablet 1  . Plant Sterol Stanol-Pantethine (CHOLESTOFF COMPLETE PO) Take 1 capsule by mouth daily.    Vladimir Faster Glycol-Propyl Glycol (SYSTANE OP) Place 1 drop into both eyes 4 (four) times daily as needed (dry eyes).    . ranitidine (ZANTAC) 150 MG tablet Take 150 mg by mouth daily.    . traMADol (ULTRAM) 50 MG tablet TAKE 1 TABLET EVERY 8 HOURS AS NEEDED 90 tablet 3  . traZODone (DESYREL) 150 MG tablet Take 150 mg by mouth at bedtime.     No current facility-administered medications for this visit.     Review of Systems : See HPI for pertinent positives and negatives.  Physical Examination  Vitals:   01/04/18 1006 01/04/18 1008  BP: (!) 156/75 134/70  Pulse: (!) 53   Resp: 18   Temp: 97.8 F (36.6 C)   TempSrc: Oral   Weight: 201 lb (91.2 kg)   Height: 5\' 11"  (1.803 m)    Body mass index is 28.03 kg/m.  General: WDWN male in NAD. Central adiposity.  GAIT:normal HENT: No apparent abnormalities  Eyes: PERRLA Pulmonary: Respirations are non-labored, CTAB, no rales, rhonchi, or wheezing. Cardiac: regular rhythm, bradycardic (on a beta blocker), no detected murmur.  VASCULAR EXAM Carotid Bruits Right Left   Negative negative   Abdominal aortic pulseis not palpable. Radial pulses are 2+ palpable and equal.   LE Pulses Right Left  POPLITEAL not palpable not palpable  POSTERIOR TIBIAL 2+palpable 1+palpable  DORSALIS PEDIS ANTERIOR TIBIAL notpalpable 1+palpable    Gastrointestinal: soft, nontender, BS WNL, no r/g, no palpable  masses. Musculoskeletal: no muscle atrophy/wasting. M/S 5/5 throughout, extremities without ischemic changes. Skin: No rash, no cellulitis, no ulcers noted. Tanned.  Neurologic: A&O X 3; appropriate affect, speech is normal, CN 2-12 intact, pain and light touch intact in extremities, motor exam as listed above. Psychiatric: Normal thought content, mood appropriate to clinical situation    Assessment: Richard Burgess is a 84 y.o. male who has no history of stroke or TIA. Fortunately he does not have DM.  His atherosclerotic risk factors include remote history of smoking. He is statin intolerant, he takes a daily 81 mg ASA.    DATA Crotid Duplex (01-04-18): Right ICA: 60 - 79 % stenosis Left ICA: 1-39% stenosis. Left vertebral artery is  antegrade, right vertebral artery was not visualized.  Bilateral subclavian artery waveforms are normal. No significant change compared to the examsof 09-05-15, 03-10-16, 11-10-16, and 07-03-17.   Plan:  Follow-up in 22monthswith Carotid Duplex scan.   I discussed in depth with the patient the nature of atherosclerosis, and emphasized the importance of maximal medical management including strict control of blood pressure, blood glucose, and lipid levels, obtaining regular exercise, and continued cessation of smoking.  The patient is aware that without maximal medical management the underlying atherosclerotic disease process will progress, limiting the benefit of any interventions. The patient was given information about stroke prevention and what symptoms should prompt the patient to seek immediate medical care. Thank you for allowing [place] to participate in this patient's care.  Clemon Chambers, RN, MSN, FNP-C [facility] Office: [facility] Physician: Oneida Alar  01/04/18 10:38 AM

## 2018-01-04 NOTE — Patient Instructions (Signed)

## 2018-01-25 ENCOUNTER — Other Ambulatory Visit: Payer: Self-pay

## 2018-01-25 DIAGNOSIS — I6523 Occlusion and stenosis of bilateral carotid arteries: Secondary | ICD-10-CM

## 2018-01-27 ENCOUNTER — Telehealth: Payer: Self-pay | Admitting: Cardiovascular Disease

## 2018-01-27 ENCOUNTER — Ambulatory Visit (INDEPENDENT_AMBULATORY_CARE_PROVIDER_SITE_OTHER): Payer: PPO | Admitting: Internal Medicine

## 2018-01-27 ENCOUNTER — Encounter: Payer: Self-pay | Admitting: Internal Medicine

## 2018-01-27 DIAGNOSIS — G459 Transient cerebral ischemic attack, unspecified: Secondary | ICD-10-CM

## 2018-01-27 DIAGNOSIS — E538 Deficiency of other specified B group vitamins: Secondary | ICD-10-CM | POA: Diagnosis not present

## 2018-01-27 DIAGNOSIS — G8929 Other chronic pain: Secondary | ICD-10-CM

## 2018-01-27 DIAGNOSIS — I10 Essential (primary) hypertension: Secondary | ICD-10-CM

## 2018-01-27 DIAGNOSIS — E785 Hyperlipidemia, unspecified: Secondary | ICD-10-CM | POA: Diagnosis not present

## 2018-01-27 DIAGNOSIS — M5440 Lumbago with sciatica, unspecified side: Secondary | ICD-10-CM

## 2018-01-27 MED ORDER — LINACLOTIDE 290 MCG PO CAPS: 290.0000 ug | ORAL_CAPSULE | Freq: Every day | ORAL | 11 refills | Status: DC | PRN

## 2018-01-27 NOTE — Assessment & Plan Note (Signed)
On B12 

## 2018-01-27 NOTE — Telephone Encounter (Signed)
New Massage:  Patient daughter calling she received a letter stating that he was approve for a grant Medication for cholestanol injection. Please call daughter, they  have not heard anything and its been  about 2 weeks.

## 2018-01-27 NOTE — Assessment & Plan Note (Signed)
Losartan, Toprol, Isosorbide

## 2018-01-27 NOTE — Assessment & Plan Note (Signed)
Tramadol prn - rare  Potential benefits of a long term opioids use as well as potential risks (i.e. addiction risk, apnea etc) and complications (i.e. Somnolence, constipation and others) were explained to the patient and were aknowledged.  

## 2018-01-27 NOTE — Progress Notes (Signed)
Subjective:  Patient ID: Richard Burgess, male    DOB: 1929-01-30  Age: 84 y.o. MRN: [id number]  CC: No chief complaint on file.   HPI [place] presents for HTN, CAD, OA f/u C/o constipation off and on - worse lately  Outpatient Medications Prior to Visit  Medication Sig Dispense Refill  . aspirin EC 81 MG tablet Take 81 mg by mouth daily.    . cholecalciferol (VITAMIN D) 1000 units tablet Take 1,000 Units by mouth daily.    . clopidogrel (PLAVIX) 75 MG tablet Take 1 tablet (75 mg total) by mouth daily. 30 tablet 11  . cyanocobalamin 500 MCG tablet Take 500 mcg by mouth daily. Vitamin B12    . Evolocumab with Infusor ([place]) 420 MG/3.5ML SOCT Inject 420 mg into the skin every 30 (thirty) days. 1 Cartridge 12  . isosorbide mononitrate (IMDUR) 60 MG 24 hr tablet Take 60 mg by mouth daily.    Marland Kitchen losartan (COZAAR) 25 MG tablet Take 1 tablet (25 mg total) by mouth daily. 90 tablet 2  . metoprolol succinate (TOPROL XL) 25 MG 24 hr tablet Take 1 tablet (25 mg total) by mouth daily. 90 tablet 3  . Multiple Vitamins-Minerals (PRESERVISION AREDS 2) CAPS Take 1 capsule by mouth daily with supper.    . nitroGLYCERIN (NITROSTAT) 0.4 MG SL tablet Place 1 tablet (0.4 mg total) under the tongue every 5 (five) minutes as needed for chest pain. 20 tablet 1  . Plant Sterol Stanol-Pantethine (CHOLESTOFF COMPLETE PO) Take 1 capsule by mouth daily.    Vladimir Faster Glycol-Propyl Glycol (SYSTANE OP) Place 1 drop into both eyes 4 (four) times daily as needed (dry eyes).    . ranitidine (ZANTAC) 150 MG tablet Take 150 mg by mouth daily.    . traMADol (ULTRAM) 50 MG tablet TAKE 1 TABLET EVERY 8 HOURS AS NEEDED 90 tablet 3  . traZODone (DESYREL) 150 MG tablet Take 150 mg by mouth at bedtime.     No facility-administered medications prior to visit.     ROS: Review of Systems  Constitutional: Negative for appetite change, fatigue and unexpected weight change.  HENT: Negative for  congestion, nosebleeds, sneezing, sore throat and trouble swallowing.   Eyes: Negative for itching and visual disturbance.  Respiratory: Negative for cough.   Cardiovascular: Negative for chest pain, palpitations and leg swelling.  Gastrointestinal: Positive for constipation. Negative for abdominal distention, blood in stool, diarrhea and nausea.  Genitourinary: Negative for frequency and hematuria.  Musculoskeletal: Positive for arthralgias, back pain and gait problem. Negative for joint swelling and neck pain.  Skin: Negative for rash.  Neurological: Negative for dizziness, tremors, speech difficulty and weakness.  Psychiatric/Behavioral: Negative for agitation, dysphoric mood and sleep disturbance. The patient is not nervous/anxious.     Objective:  BP 136/78 (BP Location: Left Arm, Patient Position: Sitting, Cuff Size: Normal)   Pulse 68   Temp 98 F (36.7 C) (Oral)   Ht 5\' 11"  (1.803 m)   Wt 202 lb (91.6 kg)   SpO2 96%   BMI 28.17 kg/m   BP Readings from Last 3 Encounters:  01/27/18 136/78  01/04/18 134/70  12/04/17 114/63    Wt Readings from Last 3 Encounters:  01/27/18 202 lb (91.6 kg)  01/04/18 201 lb (91.2 kg)  12/04/17 210 lb (95.3 kg)    Physical Exam  Constitutional: He is oriented to person, place, and time. He appears well-developed. No distress.  NAD  HENT:  Mouth/Throat:  Oropharynx is clear and moist.  Eyes: Pupils are equal, round, and reactive to light. Conjunctivae are normal.  Neck: Normal range of motion. No JVD present. No thyromegaly present.  Cardiovascular: Normal rate, regular rhythm, normal heart sounds and intact distal pulses. Exam reveals no gallop and no friction rub.  No murmur heard. Pulmonary/Chest: Effort normal and breath sounds normal. No respiratory distress. He has no wheezes. He has no rales. He exhibits no tenderness.  Abdominal: Soft. Bowel sounds are normal. He exhibits no distension and no mass. There is no tenderness. There is  no rebound and no guarding.  Musculoskeletal: Normal range of motion. He exhibits no edema or tenderness.  Lymphadenopathy:    He has no cervical adenopathy.  Neurological: He is alert and oriented to person, place, and time. He has normal reflexes. No cranial nerve deficit. He exhibits normal muscle tone. He displays a negative Romberg sign. Coordination and gait normal.  Skin: Skin is warm and dry. No rash noted.  Psychiatric: He has a normal mood and affect. His behavior is normal. Judgment and thought content normal.  limp  Lab Results  Component Value Date   WBC 5.6 04/10/2017   HGB 12.4 (L) 04/10/2017   HCT 37.1 (L) 04/10/2017   PLT 180 04/10/2017   GLUCOSE 118 (H) 05/11/2017   CHOL 219 (H) 12/08/2017   TRIG 169 (H) 12/08/2017   HDL 40 12/08/2017   LDLDIRECT 153.0 02/18/2016   LDLCALC 145 (H) 12/08/2017   ALT 17 12/08/2017   AST 13 12/08/2017   NA 139 05/11/2017   K 4.8 05/11/2017   CL 100 05/11/2017   CREATININE 1.11 05/11/2017   BUN 14 05/11/2017   CO2 23 05/11/2017   TSH 2.88 02/18/2016   PSA 0.28 03/23/2014   INR 0.97 04/10/2017   HGBA1C 5.9 (H) 01/25/2017   MICROALBUR 0.2 12/13/2007    No results found.  Assessment & Plan:   There are no diagnoses linked to this encounter.   No orders of the defined types were placed in this encounter.    Follow-up: No follow-ups on file.  [name], MD

## 2018-01-27 NOTE — Assessment & Plan Note (Signed)
No relapse 

## 2018-01-27 NOTE — Assessment & Plan Note (Signed)
Planning to start [place]

## 2018-01-27 NOTE — Telephone Encounter (Signed)
LMOM for dtr to return call

## 2018-02-04 ENCOUNTER — Telehealth: Payer: Self-pay | Admitting: Cardiovascular Disease

## 2018-02-04 NOTE — Telephone Encounter (Signed)
Spoke with daughter, she will download safety net application and bring by the office

## 2018-02-04 NOTE — Telephone Encounter (Signed)
New Message:   Pt is return a call to [employer]

## 2018-03-24 ENCOUNTER — Ambulatory Visit (INDEPENDENT_AMBULATORY_CARE_PROVIDER_SITE_OTHER): Payer: PPO

## 2018-03-24 ENCOUNTER — Ambulatory Visit (INDEPENDENT_AMBULATORY_CARE_PROVIDER_SITE_OTHER): Payer: No Typology Code available for payment source | Admitting: Family Medicine

## 2018-03-24 ENCOUNTER — Ambulatory Visit: Payer: PPO | Admitting: Nurse Practitioner

## 2018-03-24 VITALS — BP 130/70 | HR 61 | Temp 98.2°F | Wt 205.8 lb

## 2018-03-24 DIAGNOSIS — M170 Bilateral primary osteoarthritis of knee: Secondary | ICD-10-CM

## 2018-03-24 MED ORDER — TRIAMCINOLONE ACETONIDE 40 MG/ML IJ SUSP
40.0000 mg | Freq: Once | INTRAMUSCULAR | Status: AC
  Administered 2018-03-24: 40 mg via INTRAMUSCULAR

## 2018-03-24 NOTE — Progress Notes (Signed)
Richard Burgess - 84 y.o. male MRN [id number]  Date of birth: November 29, 1928  SUBJECTIVE:  Including CC & ROS.  Chief Complaint  Patient presents with  . Joint Swelling    Richard Burgess is a 84 y.o. male that is presenting with bilateral knee pain.  The pain is acute on chronic in nature.  He received a steroid injection 5 years ago in his left knee and had improvement of his pain since then.  This pain started over the course the past few weeks.  The pain is on the medial component of each knee.  He reports swelling.  He denies any mechanical symptoms.  He has not taken any medications recently.  He denies any mechanism of injury.  The pain is worse after he has been walking for some period of time.  The pain is localized to the knee.  The pain is severe in nature..    Review of Systems  Constitutional: Negative for fever.  HENT: Negative for congestion.   Respiratory: Negative for cough.   Cardiovascular: Negative for chest pain.  Gastrointestinal: Negative for abdominal pain.  Musculoskeletal: Positive for arthralgias.  Skin: Negative for color change.  Neurological: Negative for weakness.  Hematological: Negative for adenopathy.  Psychiatric/Behavioral: Negative for agitation.    HISTORY: Past Medical, Surgical, Social, and Family History Reviewed & Updated per EMR.   Pertinent Historical Findings include:  Past Medical History:  Diagnosis Date  . Allergy   . BPH (benign prostatic hypertrophy)   . Carotid artery occlusion   . Diverticulosis of colon   . ED (erectile dysfunction)   . GERD (gastroesophageal reflux disease)   . Glucose intolerance (impaired glucose tolerance)   . History of pancreatitis   . Hx-TIA (transient ischemic attack)   . Hyperlipidemia   . Hypertension   . Hypertention, malignant, with acute intensive management   . Low back pain   . Osteoarthritis   . PVD (peripheral vascular disease) ([place])    Bilateral carotid  . Tubular adenoma of colon 08/1991     One with carcinoma IN [facility] 1993, multiple adenomatous    Past Surgical History:  Procedure Laterality Date  . APPENDECTOMY    . BASAL CELL CARCINOMA EXCISION    . CHOLECYSTECTOMY    . COLONOSCOPY    . ESOPHAGOGASTRODUODENOSCOPY    . LEFT HEART CATH AND CORONARY ANGIOGRAPHY N/A 01/26/2017   Procedure: LEFT HEART CATH AND CORONARY ANGIOGRAPHY;  Surgeon: Lorretta Harp, MD;  Location: [facility];  Service: Cardiovascular;  Laterality: N/A;  . LUMBAR SPINE SURGERY     X4    Allergies  Allergen Reactions  . Amlodipine Other (See Comments)    Weakness in legs  . Livalo [Pitavastatin]     Pain, SOB  . Losartan Other (See Comments)    Leg numbness/stiffness.   . Oxycodone Other (See Comments)    crazy  . Pravastatin Sodium Other (See Comments)    REACTION: bad dreams  . Zetia [Ezetimibe]     LEG PAIN   . Rosuvastatin Palpitations  . Statins Palpitations and Other (See Comments)    REACTION: aches, bad dreams    Family History  Problem Relation Age of Onset  . Dementia Father   . Diabetes Mother   . Peripheral vascular disease Brother   . Colon cancer Sister        dx in her 75's     Social History   Socioeconomic History  . Marital status: Married  Spouse name: Not on file  . Number of children: 3  . Years of education: Not on file  . Highest education level: Not on file  Occupational History  . Occupation: Retired-Construction  Social Needs  . Financial resource strain: Not on file  . Food insecurity:    Worry: Not on file    Inability: Not on file  . Transportation needs:    Medical: Not on file    Non-medical: Not on file  Tobacco Use  . Smoking status: Former Smoker    Years: 20.00    Types: Cigarettes  . Smokeless tobacco: Never Used  . Tobacco comment: Daily caffeine use, regular exercise  Substance and Sexual Activity  . Alcohol use: Yes    Alcohol/week: 7.0 - 14.0 standard drinks    Types: 7 - 14 Standard drinks or equivalent per  week    Comment: occ beer  . Drug use: No  . Sexual activity: Yes  Lifestyle  . Physical activity:    Days per week: Not on file    Minutes per session: Not on file  . Stress: Not on file  Relationships  . Social connections:    Talks on phone: Not on file    Gets together: Not on file    Attends religious service: Not on file    Active member of club or organization: Not on file    Attends meetings of clubs or organizations: Not on file    Relationship status: Not on file  . Intimate partner violence:    Fear of current or ex partner: Not on file    Emotionally abused: Not on file    Physically abused: Not on file    Forced sexual activity: Not on file  Other Topics Concern  . Not on file  Social History Narrative  . Not on file     PHYSICAL EXAM:  VS: BP 130/70   Pulse 61   Temp 98.2 F (36.8 C) (Oral)   Wt 205 lb 12.8 oz (93.4 kg)   SpO2 97%   BMI 28.70 kg/m  Physical Exam Gen: NAD, alert, cooperative with exam, well-appearing ENT: normal lips, normal nasal mucosa,  Eye: normal EOM, normal conjunctiva and lids CV:  no edema, +2 pedal pulses   Resp: no accessory muscle use, non-labored,  Skin: no rashes, no areas of induration  Neuro: normal tone, normal sensation to touch Psych:  normal insight, alert and oriented MSK:  Left and right knee: Valgus deformity bilaterally. Effusion noted. Normal range of motion. Normal strength. Instability with valgus stress testing. Negative McMurray's test. Neurovascularly intact    Aspiration/Injection Procedure Note Richard Burgess 18-Apr-1929  Procedure: Aspiration and Injection Indications: left knee pain   Procedure Details Consent: Risks of procedure as well as the alternatives and risks of each were explained to the (patient/caregiver).  Consent for procedure obtained. Time Out: Verified patient identification, verified procedure, site/side was marked, verified correct patient position, special  equipment/implants available, medications/allergies/relevent history reviewed, required imaging and test results available.  Performed.  The area was cleaned with iodine and alcohol swabs.    The left knee suprapatellar pouch was aspirated using an 18-gauge inch and half needle.  The syringe was switched and a mixture using 1 cc's of 40 mg Kenalog and 4 cc's of 0.25% of bupivacaine.  Ultrasound was used. Images were obtained in Long views showing the injection.    Amount of Fluid Aspirated: 39mL Character of Fluid: clear and straw colored Fluid  was sent for:n/a A sterile dressing was applied.  Patient did tolerate procedure well.   Aspiration/Injection Procedure Note Richard Burgess 1928-12-18  Procedure: Aspiration and Injection Indications: Right knee pain  Procedure Details Consent: Risks of procedure as well as the alternatives and risks of each were explained to the (patient/caregiver).  Consent for procedure obtained. Time Out: Verified patient identification, verified procedure, site/side was marked, verified correct patient position, special equipment/implants available, medications/allergies/relevent history reviewed, required imaging and test results available.  Performed.  The area was cleaned with iodine and alcohol swabs.    The right knee suprapatellar pouch was aspirated using an 18-gauge inch and half needle.  The syringe was switched and a mixture using 1 cc's of 40 mg Kenalog and 4 cc's of 0.25% of bupivacaine.  Ultrasound was used. Images were obtained in Long views showing the injection.    Amount of Fluid Aspirated: 64mL Character of Fluid: clear and straw colored Fluid was sent for:n/a A sterile dressing was applied.  Patient did tolerate procedure well.    ASSESSMENT & PLAN:   Osteoarthritis Pain is likely the result of degenerative changes.  - bilateral aspiration and injection today  - provided pennsaid samples - counseled on supportive care - if no  improvement would consider gel injections and up date imaging.

## 2018-03-24 NOTE — Patient Instructions (Signed)
Nice to meet you  Take tylenol 650 mg three times a day is the best evidence based medicine we have for arthritis.  Glucosamine sulfate 750mg  twice a day is a supplement that has been shown to help moderate to severe arthritis. Vitamin D 2000 IU daily Fish oil 2 grams daily.  Tumeric 500mg  twice daily.  Capsaicin topically up to four times a day may also help with pain. Cortisone injections are an option if these interventions do not seem to make a difference or need more relief.  If cortisone injections do not help, there are different types of shots that may help but they take longer to take effect.  We can discuss this at follow up.  It's important that you continue to stay active. Please see me back if your pain doesn't improve and we can try different injections.

## 2018-03-24 NOTE — Assessment & Plan Note (Signed)
Pain is likely the result of degenerative changes.  - bilateral aspiration and injection today  - provided pennsaid samples - counseled on supportive care - if no improvement would consider gel injections and up date imaging.

## 2018-04-18 ENCOUNTER — Other Ambulatory Visit: Payer: Self-pay | Admitting: Cardiology

## 2018-04-28 ENCOUNTER — Telehealth: Payer: Self-pay

## 2018-04-28 NOTE — Telephone Encounter (Signed)
Called pt to schedule an appt for a pen/needle demonstration and assistance with the first injection. Scheduled for 04/30/18 @ 11:30am

## 2018-04-30 ENCOUNTER — Other Ambulatory Visit: Payer: Self-pay | Admitting: Pharmacist Clinician (PhC)/ Clinical Pharmacy Specialist

## 2018-04-30 MED ORDER — ISOSORBIDE MONONITRATE ER 60 MG PO TB24: 60.0000 mg | ORAL_TABLET | Freq: Every day | ORAL | 3 refills | Status: DC

## 2018-04-30 MED ORDER — LOSARTAN POTASSIUM 25 MG PO TABS: 25.0000 mg | ORAL_TABLET | Freq: Every day | ORAL | 3 refills | Status: DC

## 2018-04-30 NOTE — Telephone Encounter (Signed)
Patient in office today for first dose of Repatha Pushtronix.  Unfortunately we had problems with both his device as well as the sample device in our office. Neither would dispense medication.  He was ultimately given a Sureclick 165 mg dose in the office and another dose to take home for in 2 weeks.  We will call [employer] and ask if he could be switched to the [facility] for future fills.  Also reviewed medication bottles.  He has bottles of both ranitidine and pantoprazole and thinks he is actually taking both meds twice daily.  States no problems with heartburn for most of the past year, except when eating overly spicy foods.  I asked him to discontinue ranitidine and cut the pantoprazole to just once daily.  If after a month he is doing well, he can cut it to just 3 times per week.  If he continues to just have the occasional problems, he will probably do just fine with Tums or prn ranitidine.

## 2018-05-25 ENCOUNTER — Telehealth: Payer: Self-pay

## 2018-05-25 NOTE — Telephone Encounter (Signed)
Called pt to let them know to call the [employer]. They want a sample to get them through until they receive med because pt due to take next injection on Friday.

## 2018-05-25 NOTE — Telephone Encounter (Signed)
Pt daughter called to find our how to refill repatha.

## 2018-05-25 NOTE — Telephone Encounter (Signed)
They will need to call [facility] at [phone] to schedule another shipment through the drug company.

## 2018-05-31 ENCOUNTER — Ambulatory Visit (INDEPENDENT_AMBULATORY_CARE_PROVIDER_SITE_OTHER): Payer: PPO | Admitting: Internal Medicine

## 2018-05-31 ENCOUNTER — Encounter: Payer: Self-pay | Admitting: Internal Medicine

## 2018-05-31 ENCOUNTER — Other Ambulatory Visit (INDEPENDENT_AMBULATORY_CARE_PROVIDER_SITE_OTHER): Payer: PPO

## 2018-05-31 DIAGNOSIS — E538 Deficiency of other specified B group vitamins: Secondary | ICD-10-CM | POA: Diagnosis not present

## 2018-05-31 DIAGNOSIS — I739 Peripheral vascular disease, unspecified: Secondary | ICD-10-CM

## 2018-05-31 DIAGNOSIS — I10 Essential (primary) hypertension: Secondary | ICD-10-CM | POA: Diagnosis not present

## 2018-05-31 DIAGNOSIS — E785 Hyperlipidemia, unspecified: Secondary | ICD-10-CM

## 2018-05-31 LAB — LIPID PANEL
CHOLESTEROL: 102 mg/dL (ref 0–200)
HDL: 41.6 mg/dL (ref >39.00)
LDL CALC: 33 mg/dL (ref 0–99)
NonHDL: 60.32
TRIGLYCERIDES: 136 mg/dL (ref 0.0–149.0)
Total CHOL/HDL Ratio: 2
VLDL: 27.2 mg/dL (ref 0.0–40.0)

## 2018-05-31 LAB — BASIC METABOLIC PANEL
BUN: 17 mg/dL (ref 6–23)
CO2: 27 mEq/L (ref 19–32)
CREATININE: 1.05 mg/dL (ref 0.40–1.50)
Calcium: 10 mg/dL (ref 8.4–10.5)
Chloride: 104 mEq/L (ref 96–112)
GFR: 70.93 mL/min (ref >60.00)
Glucose, Bld: 159 mg/dL — ABNORMAL HIGH (ref 70–99)
Potassium: 4.6 mEq/L (ref 3.5–5.1)
Sodium: 138 mEq/L (ref 135–145)

## 2018-05-31 LAB — HEPATIC FUNCTION PANEL
ALK PHOS: 58 U/L (ref 39–117)
ALT: 12 U/L (ref 0–53)
AST: 14 U/L (ref 0–37)
Albumin: 4.3 g/dL (ref 3.5–5.2)
Bilirubin, Direct: 0.2 mg/dL (ref 0.0–0.3)
TOTAL PROTEIN: 6.9 g/dL (ref 6.0–8.3)
Total Bilirubin: 0.7 mg/dL (ref 0.2–1.2)

## 2018-05-31 LAB — CK: CK TOTAL: 92 U/L (ref 7–232)

## 2018-05-31 NOTE — Assessment & Plan Note (Signed)
   On Repatha 2019

## 2018-05-31 NOTE — Assessment & Plan Note (Signed)
On B12 

## 2018-05-31 NOTE — Progress Notes (Signed)
Subjective:  Patient ID: Richard Burgess, male    DOB: 1928/07/18  Age: 84 y.o. MRN: [id number]  CC: No chief complaint on file.   HPI Richard Burgess presents for CAD, dyslipidemia, HTN f/u. On Repatha now  Outpatient Medications Prior to Visit  Medication Sig Dispense Refill  . aspirin EC 81 MG tablet Take 81 mg by mouth daily.    . cholecalciferol (VITAMIN D) 1000 units tablet Take 1,000 Units by mouth daily.    . clopidogrel (PLAVIX) 75 MG tablet Take 1 tablet (75 mg total) by mouth daily. 30 tablet 11  . cyanocobalamin 500 MCG tablet Take 500 mcg by mouth daily. Vitamin B12    . Evolocumab with Infusor (Keystone) 420 MG/3.5ML SOCT Inject 420 mg into the skin every 30 (thirty) days. 1 Cartridge 12  . isosorbide mononitrate (IMDUR) 60 MG 24 hr tablet Take 1 tablet (60 mg total) by mouth daily. 90 tablet 3  . linaclotide (LINZESS) 290 MCG CAPS capsule Take 1 capsule (290 mcg total) by mouth daily as needed. 30 capsule 11  . losartan (COZAAR) 25 MG tablet Take 1 tablet (25 mg total) by mouth daily. 90 tablet 3  . metoprolol succinate (TOPROL XL) 25 MG 24 hr tablet Take 1 tablet (25 mg total) by mouth daily. 90 tablet 3  . Multiple Vitamins-Minerals (PRESERVISION AREDS 2) CAPS Take 1 capsule by mouth daily with supper.    . nitroGLYCERIN (NITROSTAT) 0.4 MG SL tablet Place 1 tablet (0.4 mg total) under the tongue every 5 (five) minutes as needed for chest pain. 20 tablet 1  . Plant Sterol Stanol-Pantethine (CHOLESTOFF COMPLETE PO) Take 1 capsule by mouth daily.    Vladimir Faster Glycol-Propyl Glycol (SYSTANE OP) Place 1 drop into both eyes 4 (four) times daily as needed (dry eyes).    . ranitidine (ZANTAC) 150 MG tablet Take 150 mg by mouth daily.    . traMADol (ULTRAM) 50 MG tablet TAKE 1 TABLET EVERY 8 HOURS AS NEEDED 90 tablet 3  . traZODone (DESYREL) 150 MG tablet Take 150 mg by mouth at bedtime.     No facility-administered medications prior to visit.      ROS: Review of Systems  Constitutional: Negative for appetite change, fatigue and unexpected weight change.  HENT: Negative for congestion, nosebleeds, sneezing, sore throat and trouble swallowing.   Eyes: Negative for itching and visual disturbance.  Respiratory: Negative for cough, chest tightness and shortness of breath.   Cardiovascular: Negative for chest pain, palpitations and leg swelling.  Gastrointestinal: Negative for abdominal distention, blood in stool, diarrhea and nausea.  Genitourinary: Negative for frequency and hematuria.  Musculoskeletal: Positive for arthralgias. Negative for back pain, gait problem, joint swelling and neck pain.  Skin: Negative for rash.  Neurological: Negative for dizziness, tremors, speech difficulty and weakness.  Psychiatric/Behavioral: Negative for agitation, dysphoric mood and sleep disturbance. The patient is not nervous/anxious.     Objective:  BP 124/64 (BP Location: Left Arm, Patient Position: Sitting, Cuff Size: Large)   Pulse 81   Temp 98.2 F (36.8 C) (Oral)   Ht 5\' 11"  (1.803 m)   Wt 210 lb (95.3 kg)   SpO2 96%   BMI 29.29 kg/m   BP Readings from Last 3 Encounters:  05/31/18 124/64  03/24/18 130/70  01/27/18 136/78    Wt Readings from Last 3 Encounters:  05/31/18 210 lb (95.3 kg)  03/24/18 205 lb 12.8 oz (93.4 kg)  01/27/18 202 lb (91.6 kg)  Physical Exam Constitutional:      General: He is not in acute distress.    Appearance: He is well-developed.     Comments: NAD  Eyes:     Conjunctiva/sclera: Conjunctivae normal.     Pupils: Pupils are equal, round, and reactive to light.  Neck:     Musculoskeletal: Normal range of motion.     Thyroid: No thyromegaly.     Vascular: No JVD.  Cardiovascular:     Rate and Rhythm: Normal rate and regular rhythm.     Heart sounds: Normal heart sounds. No murmur. No friction rub. No gallop.   Pulmonary:     Effort: Pulmonary effort is normal. No respiratory distress.      Breath sounds: Normal breath sounds. No wheezing or rales.  Chest:     Chest wall: No tenderness.  Abdominal:     General: Bowel sounds are normal. There is no distension.     Palpations: Abdomen is soft. There is no mass.     Tenderness: There is no abdominal tenderness. There is no guarding or rebound.  Musculoskeletal: Normal range of motion.        General: No tenderness.  Lymphadenopathy:     Cervical: No cervical adenopathy.  Skin:    General: Skin is warm and dry.     Findings: No rash.  Neurological:     Mental Status: He is alert and oriented to person, place, and time.     Cranial Nerves: No cranial nerve deficit.     Motor: No abnormal muscle tone.     Coordination: Coordination normal.     Gait: Gait normal.     Deep Tendon Reflexes: Reflexes are normal and symmetric.  Psychiatric:        Behavior: Behavior normal.        Thought Content: Thought content normal.        Judgment: Judgment normal.   L lower eye lid is swollen w/erythema - s/p bx at the [facility] AKs OA changes  Lab Results  Component Value Date   WBC 5.6 04/10/2017   HGB 12.4 (L) 04/10/2017   HCT 37.1 (L) 04/10/2017   PLT 180 04/10/2017   GLUCOSE 118 (H) 05/11/2017   CHOL 219 (H) 12/08/2017   TRIG 169 (H) 12/08/2017   HDL 40 12/08/2017   LDLDIRECT 153.0 02/18/2016   LDLCALC 145 (H) 12/08/2017   ALT 17 12/08/2017   AST 13 12/08/2017   NA 139 05/11/2017   K 4.8 05/11/2017   CL 100 05/11/2017   CREATININE 1.11 05/11/2017   BUN 14 05/11/2017   CO2 23 05/11/2017   TSH 2.88 02/18/2016   PSA 0.28 03/23/2014   INR 0.97 04/10/2017   HGBA1C 5.9 (H) 01/25/2017   MICROALBUR 0.2 12/13/2007    Vas US Carotid  Result Date: 01/04/2018 Carotid Arterial Duplex Study Indications:       Carotid artery disease. Risk Factors:      Hypertension, hyperlipidemia, past history of smoking, PAD. Comparison Study:  No significant change when compared to previous exam on                    07/03/17. Performing  Technologist: Burley Saver RVT  Examination Guidelines: A complete evaluation includes B-mode imaging, spectral Doppler, color Doppler, and power Doppler as needed of all accessible portions of each vessel. Bilateral testing is considered an integral part of a complete examination. Limited examinations for reoccurring indications may be performed as noted.  Right Carotid  Findings: +----------+--------+--------+--------+------------+--------+           PSV cm/sEDV cm/sStenosisDescribe    Comments +----------+--------+--------+--------+------------+--------+ CCA Prox  56      11              homogeneous          +----------+--------+--------+--------+------------+--------+ CCA Mid   48      13              heterogenous         +----------+--------+--------+--------+------------+--------+ CCA Distal66      12              calcific             +----------+--------+--------+--------+------------+--------+ ICA Prox  278     85      60-79%  heterogenous         +----------+--------+--------+--------+------------+--------+ ICA Mid   51      11              heterogenous         +----------+--------+--------+--------+------------+--------+ ICA Distal44      11                                   +----------+--------+--------+--------+------------+--------+ ECA       95      9                                    +----------+--------+--------+--------+------------+--------+ +----------+--------+-------+----------------+-------------------+           PSV cm/sEDV cmsDescribe        Arm Pressure (mmHG) +----------+--------+-------+----------------+-------------------+ [id number]            Multiphasic, WNL                    +----------+--------+-------+----------------+-------------------+ +---------+--------+--------+--------------+ VertebralPSV cm/sEDV cm/sNot identified +---------+--------+--------+--------------+  Left Carotid Findings:  +----------+--------+--------+--------+------------+--------+           PSV cm/sEDV cm/sStenosisDescribe    Comments +----------+--------+--------+--------+------------+--------+ CCA Prox  64      12                                   +----------+--------+--------+--------+------------+--------+ CCA Mid   83      18                                   +----------+--------+--------+--------+------------+--------+ CCA Distal65      16              calcific             +----------+--------+--------+--------+------------+--------+ ICA Prox  60      20      1-39%   heterogenous         +----------+--------+--------+--------+------------+--------+ ICA Mid   57      19                                   +----------+--------+--------+--------+------------+--------+ ICA Distal42      10                                   +----------+--------+--------+--------+------------+--------+  ECA       101     14                                   +----------+--------+--------+--------+------------+--------+ +----------+--------+--------+----------------+-------------------+ SubclavianPSV cm/sEDV cm/sDescribe        Arm Pressure (mmHG) +----------+--------+--------+----------------+-------------------+           129             Multiphasic, WNL                    +----------+--------+--------+----------------+-------------------+ +---------+--------+--------+---------+ VertebralPSV cm/sEDV cm/sAntegrade +---------+--------+--------+---------+  Final Interpretation: Right Carotid: Velocities in the right ICA are consistent with a 60-79%                stenosis. Left Carotid: Velocities in the left ICA are consistent with a 1-39% stenosis. Vertebrals:  Left vertebral artery demonstrates antegrade flow. Right vertebral              artery was not visualized. Subclavians: Normal flow hemodynamics were seen in bilateral subclavian              arteries. *See table(s) above  for measurements and observations.  Electronically signed by Ruta Hinds MD on 01/04/2018 at 1:00:39 PM.    Final     Assessment & Plan:   There are no diagnoses linked to this encounter.   No orders of the defined types were placed in this encounter.    Follow-up: No follow-ups on file.  [name], MD

## 2018-05-31 NOTE — Assessment & Plan Note (Addendum)
  Cont w/ Losartan, ASA On Repatha 2019

## 2018-05-31 NOTE — Assessment & Plan Note (Signed)
Losartan, Toprol, Isosorbide

## 2018-06-03 ENCOUNTER — Ambulatory Visit (INDEPENDENT_AMBULATORY_CARE_PROVIDER_SITE_OTHER): Payer: PPO | Admitting: Cardiovascular Disease

## 2018-06-03 ENCOUNTER — Encounter: Payer: Self-pay | Admitting: Cardiovascular Disease

## 2018-06-03 VITALS — BP 130/60 | HR 64 | Ht 71.0 in | Wt 209.6 lb

## 2018-06-03 DIAGNOSIS — R82998 Other abnormal findings in urine: Secondary | ICD-10-CM

## 2018-06-03 DIAGNOSIS — I10 Essential (primary) hypertension: Secondary | ICD-10-CM

## 2018-06-03 DIAGNOSIS — E7800 Pure hypercholesterolemia, unspecified: Secondary | ICD-10-CM

## 2018-06-03 DIAGNOSIS — I2510 Atherosclerotic heart disease of native coronary artery without angina pectoris: Secondary | ICD-10-CM

## 2018-06-03 DIAGNOSIS — I5042 Chronic combined systolic (congestive) and diastolic (congestive) heart failure: Secondary | ICD-10-CM

## 2018-06-03 LAB — URINALYSIS
BILIRUBIN UA: NEGATIVE
Glucose, UA: NEGATIVE
Ketones, UA: NEGATIVE
LEUKOCYTES UA: NEGATIVE
NITRITE UA: NEGATIVE
PH UA: 6.5 (ref 5.0–7.5)
PROTEIN UA: NEGATIVE
RBC UA: NEGATIVE
Specific Gravity, UA: 1.024 (ref 1.005–1.030)
Urobilinogen, Ur: 0.2 mg/dL (ref 0.2–1.0)

## 2018-06-03 NOTE — Progress Notes (Signed)
Cardiology Office Note   Date:  06/03/2018   ID:  Richard Burgess, DOB 1929/03/05, MRN [id number]  PCP:  Cassandria Anger, MD  Cardiologist:   [name], MD   No chief complaint on file.   History of Present Illness: Richard Burgess is an 84 y.o. male with chronic systolic and diastolic heart failure, hypertension, hyperlipidemia, aortic aneurysm, carotid stenosis, R subclavian steal, who presents for follow up.  Richard Burgess presented to the ED with NSTEMI 01/2017.  He underwent left heart catheterization 01/2017 and was noted to have 3v CAD (100% RCA, 40% OM, 60% ostial LAD, and 80% OM1.  LVEF was 45-50%.  He had L-->R collaterals to the LAD.  Medical management was recommended.  He was started on clopidogrel with plans for 12 months of DAPT.  He had a history of statin intolerance.  He was started on Zetia in the hospital.  LDL was 94 he was referred to lipid clinic and prescribed Repatha.   He reported exertional angina and was started on isosorbide.    Since his last appointment Richard Burgess has been doing well.  He continues to remain active.  He has been cutting wood and doing other yard work around his house.  He has no chest pain or shortness of breath with activity.  He has no lower extremity edema, orthopnea, or PND.  He started taking Repatha and is tolerating it well.  He notes that for a day or 2 after taking it he feels tired and does not have much energy.  He is also unsure of why he is gaining weight.  He has not changed his diet.  He notes that his urine has been foamy since starting the Repatha.  He has no dysuria, fever or chills.    Past Medical History:  Diagnosis Date  . Allergy   . BPH (benign prostatic hypertrophy)   . Carotid artery occlusion   . Diverticulosis of colon   . ED (erectile dysfunction)   . GERD (gastroesophageal reflux disease)   . Glucose intolerance (impaired glucose tolerance)   . History of pancreatitis   . Hx-TIA (transient ischemic  attack)   . Hyperlipidemia   . Hypertension   . Hypertention, malignant, with acute intensive management   . Low back pain   . Osteoarthritis   . PVD (peripheral vascular disease) ([place])    Bilateral carotid  . Tubular adenoma of colon 08/1991   One with carcinoma IN SIT[facility] 1993, multiple adenomatous    Past Surgical History:  Procedure Laterality Date  . APPENDECTOMY    . BASAL CELL CARCINOMA EXCISION    . CHOLECYSTECTOMY    . COLONOSCOPY    . ESOPHAGOGASTRODUODENOSCOPY    . LEFT HEART CATH AND CORONARY ANGIOGRAPHY N/A 01/26/2017   Procedure: LEFT HEART CATH AND CORONARY ANGIOGRAPHY;  Surgeon: Lorretta Harp, MD;  Location: [facility];  Service: Cardiovascular;  Laterality: N/A;  . LUMBAR SPINE SURGERY     X4     Current Outpatient Medications  Medication Sig Dispense Refill  . aspirin EC 81 MG tablet Take 81 mg by mouth daily.    . cholecalciferol (VITAMIN D) 1000 units tablet Take 1,000 Units by mouth daily.    . cyanocobalamin 500 MCG tablet Take 500 mcg by mouth daily. Vitamin B12    . Evolocumab with Infusor ([place]) 420 MG/3.5ML SOCT Inject 420 mg into the skin every 30 (thirty) days. 1 Cartridge 12  .  isosorbide mononitrate (IMDUR) 60 MG 24 hr tablet Take 1 tablet (60 mg total) by mouth daily. 90 tablet 3  . linaclotide (LINZESS) 290 MCG CAPS capsule Take 1 capsule (290 mcg total) by mouth daily as needed. 30 capsule 11  . losartan (COZAAR) 25 MG tablet Take 1 tablet (25 mg total) by mouth daily. 90 tablet 3  . metoprolol succinate (TOPROL XL) 25 MG 24 hr tablet Take 1 tablet (25 mg total) by mouth daily. 90 tablet 3  . Multiple Vitamins-Minerals (PRESERVISION AREDS 2) CAPS Take 1 capsule by mouth daily with supper.    . nitroGLYCERIN (NITROSTAT) 0.4 MG SL tablet Place 1 tablet (0.4 mg total) under the tongue every 5 (five) minutes as needed for chest pain. 20 tablet 1  . Polyethyl Glycol-Propyl Glycol (SYSTANE OP) Place 1 drop into both eyes 4  (four) times daily as needed (dry eyes).    . traMADol (ULTRAM) 50 MG tablet TAKE 1 TABLET EVERY 8 HOURS AS NEEDED 90 tablet 3  . traZODone (DESYREL) 150 MG tablet Take 150 mg by mouth at bedtime.     No current facility-administered medications for this visit.     Allergies:   Amlodipine; Livalo [pitavastatin]; Losartan; Oxycodone; Pravastatin sodium; Zetia [ezetimibe]; Rosuvastatin; and Statins    Social History:  The patient  reports that he has quit smoking. His smoking use included cigarettes. He quit after 20.00 years of use. He has never used smokeless tobacco. He reports current alcohol use of about 7.0 - 14.0 standard drinks of alcohol per week. He reports that he does not use drugs.   Family History:  The patient's family history includes Colon cancer in his sister; Dementia in his father; Diabetes in his mother; Peripheral vascular disease in his brother.    ROS:  Please see the history of present illness.   Otherwise, review of systems are positive for none.   All other systems are reviewed and negative.    PHYSICAL EXAM: VS:  BP 130/60   Pulse 64   Ht 5\' 11"  (1.803 m)   Wt 209 lb 9.6 oz (95.1 kg)   BMI 29.23 kg/m  , BMI Body mass index is 29.23 kg/m. GENERAL:  Well appearing HEENT: Pupils equal round and reactive, fundi not visualized, oral mucosa unremarkable NECK:  No jugular venous distention, waveform within normal limits, carotid upstroke brisk and symmetric, no bruits LUNGS:  Clear to auscultation bilaterally HEART:  RRR.  PMI not displaced or sustained,S1 and S2 within normal limits, no S3, no S4, no clicks, no rubs, no murmurs ABD:  Flat, positive bowel sounds normal in frequency in pitch, no bruits, no rebound, no guarding, no midline pulsatile mass, no hepatomegaly, no splenomegaly EXT:  2 plus pulses throughout, 1+ LE edema to above the ankle bilaterally, no cyanosis no clubbing SKIN:  No rashes no nodules NEURO:  Cranial nerves II through XII grossly intact,  motor grossly intact throughout PSYCH:  Cognitively intact, oriented to person place and time   EKG:  EKG is ordered today. 12/04/17: Sinus rhythm.  Rate 60 bpm.  RBBB. 06/03/18: Sinus rhythm.  Rate 64 bpm.  Right bundle branch block.   LEFT HEART CATH AND CORONARY ANGIOGRAPHY 01/26/17  Conclusion   Prox RCA to Mid RCA lesion, 100 %stenosed.  Ost LM lesion, 40 %stenosed.  Ost LAD lesion, 60 %stenosed.  1st Mrg lesion, 80 %stenosed.  There is mild to moderate left ventricular systolic dysfunction.  LV end diastolic pressure is mildly elevated.  The left ventricular ejection fraction is 45-50% by visual estimate.    Recent Labs: 05/31/2018: ALT 12; BUN 17; Creatinine, Ser 1.05; Potassium 4.6; Sodium 138    Lipid Panel    Component Value Date/Time   CHOL 102 05/31/2018 0915   CHOL 219 (H) 12/08/2017 1049   TRIG 136.0 05/31/2018 0915   HDL 41.60 05/31/2018 0915   HDL 40 12/08/2017 1049   CHOLHDL 2 05/31/2018 0915   VLDL 27.2 05/31/2018 0915   LDLCALC 33 05/31/2018 0915   LDLCALC 145 (H) 12/08/2017 1049   LDLDIRECT 153.0 02/18/2016 0826      Wt Readings from Last 3 Encounters:  06/03/18 209 lb 9.6 oz (95.1 kg)  05/31/18 210 lb (95.3 kg)  03/24/18 205 lb 12.8 oz (93.4 kg)      ASSESSMENT AND PLAN:  # Obstructive CAD: Richard Burgess has obstructive CAD that we are medically managing. No recurrent angina since starting Imur.  Continue aspirin, isosorbide, and metoprolol. He is doing well.   # Chronic systolic and diastolic heart failure:  LVEF 45-50% on LV gram.  Volume status is stable and he has no symptoms of heart failure.  Continue metoprolol succinate.   # Hyperlipidemia: Statin intolerant.  He also did not tolerate Zetia.  LDL is now down to 33 after starting Repatha.  # Foamy urine: Richard Burgess notes that his urine has been very foamy since starting Repatha.  He denies dysuria and it has been clear.  Protein levels are normal on labs 05/31/18.  Check u/a for  protein.   Current medicines are reviewed at length with the patient today.  The patient does not have concerns regarding medicines.  The following changes have been made:  None  Labs/ tests ordered today include:   Orders Placed This Encounter  Procedures  . Urinalysis  . EKG 12-Lead     Disposition:   FU with Richard Raether C. Oval Linsey, MD, [facility] in 1 year.  APP in 6 months.      Signed, Richard Milewski C. Oval Linsey, MD, [facility]  06/03/2018 11:22 AM    [place]

## 2018-06-03 NOTE — Patient Instructions (Signed)
Medication Instructions:  NO CHANGE If you need a refill on your cardiac medications before your next appointment, please call your pharmacy.   Lab work: Your physician recommends that you UA TODAY If you have labs (blood work) drawn today and your tests are completely normal, you will receive your results only by: Marland Kitchen MyChart Message (if you have MyChart) OR . A paper copy in the mail If you have any lab test that is abnormal or we need to change your treatment, we will call you to review the results.  Follow-Up: At [facility], you and your health needs are our priority.  As part of our continuing mission to provide you with exceptional heart care, we have created designated Provider Care Teams.  These Care Teams include your primary Cardiologist (physician) and Advanced Practice Providers (APPs -  Physician Assistants and Nurse Practitioners) who all work together to provide you with the care you need, when you need it. You will need a follow up appointment in 12 months.  Please call our office 2 months in advance to schedule this appointment.  You may see TIFFANY Lennox MD or one of the following Advanced Practice Providers on your designated Care Team:   Kerin Ransom, PA-C Roby Lofts, [place] . Sande Rives, PA-C

## 2018-06-05 ENCOUNTER — Other Ambulatory Visit: Payer: Self-pay | Admitting: Internal Medicine

## 2018-06-28 NOTE — Telephone Encounter (Signed)
Pt called stating that they haven't ordered refill and want sample. Instructed pt to order refill and that  If it wouldn't get there by Friday that we would give one injection.

## 2018-07-09 ENCOUNTER — Ambulatory Visit: Payer: PPO | Admitting: Family

## 2018-07-09 ENCOUNTER — Encounter (HOSPITAL_COMMUNITY): Payer: PPO

## 2018-07-20 NOTE — Progress Notes (Signed)
HISTORY AND PHYSICAL     CC:  follow up. Requesting Provider:  Plotnikov, Evie Lacks, MD  HPI: This is a 85 y.o. male here for follow up for carotid artery stenosis.   Pt was last seen 01/04/18 and at that time, he was asymptomatic.    He is seen today and is doing well.  He remains asymptomatic and denies any temporary blindness, facial droop, speech difficulties or clumsiness or hemiparesis.  He states that his knees are about give out and that is his only issue.  He states he has been up on the roof trying to fix a leak.     The pt is not on a statin for cholesterol management.  He takes injections (Repatha) The pt is not diabetic.   The pt is on a BB and ARB for hypertension.   Tobacco hx:  Remote-quit in the 60's.   The pt is on a daily aspirin. Other AC:  none    Past Medical History:  Diagnosis Date  . Allergy   . BPH (benign prostatic hypertrophy)   . Carotid artery occlusion   . Diverticulosis of colon   . ED (erectile dysfunction)   . GERD (gastroesophageal reflux disease)   . Glucose intolerance (impaired glucose tolerance)   . History of pancreatitis   . Hx-TIA (transient ischemic attack)   . Hyperlipidemia   . Hypertension   . Hypertention, malignant, with acute intensive management   . Low back pain   . Osteoarthritis   . PVD (peripheral vascular disease) ([place])    Bilateral carotid  . Tubular adenoma of colon 08/1991   One with carcinoma IN [facility] 1993, multiple adenomatous    Past Surgical History:  Procedure Laterality Date  . APPENDECTOMY    . BASAL CELL CARCINOMA EXCISION    . CHOLECYSTECTOMY    . COLONOSCOPY    . ESOPHAGOGASTRODUODENOSCOPY    . LEFT HEART CATH AND CORONARY ANGIOGRAPHY N/A 01/26/2017   Procedure: LEFT HEART CATH AND CORONARY ANGIOGRAPHY;  Surgeon: Lorretta Harp, MD;  Location: [facility];  Service: Cardiovascular;  Laterality: N/A;  . LUMBAR SPINE SURGERY     X4    Allergies  Allergen Reactions  . Amlodipine Other  (See Comments)    Weakness in legs  . Livalo [Pitavastatin]     Pain, SOB  . Losartan Other (See Comments)    Leg numbness/stiffness.   . Oxycodone Other (See Comments)    crazy  . Pravastatin Sodium Other (See Comments)    REACTION: bad dreams  . Zetia [Ezetimibe]     LEG PAIN   . Rosuvastatin Palpitations  . Statins Palpitations and Other (See Comments)    REACTION: aches, bad dreams    Current Outpatient Medications  Medication Sig Dispense Refill  . aspirin EC 81 MG tablet Take 81 mg by mouth daily.    . cholecalciferol (VITAMIN D) 1000 units tablet Take 1,000 Units by mouth daily.    . cyanocobalamin 500 MCG tablet Take 500 mcg by mouth daily. Vitamin B12    . Evolocumab with Infusor ([place]) 420 MG/3.5ML SOCT Inject 420 mg into the skin every 30 (thirty) days. 1 Cartridge 12  . isosorbide mononitrate (IMDUR) 60 MG 24 hr tablet Take 1 tablet (60 mg total) by mouth daily. 90 tablet 3  . linaclotide (LINZESS) 290 MCG CAPS capsule Take 1 capsule (290 mcg total) by mouth daily as needed. 30 capsule 11  . losartan (COZAAR) 25 MG tablet  Take 1 tablet (25 mg total) by mouth daily. 90 tablet 3  . metoprolol succinate (TOPROL XL) 25 MG 24 hr tablet Take 1 tablet (25 mg total) by mouth daily. 90 tablet 3  . Multiple Vitamins-Minerals (PRESERVISION AREDS 2) CAPS Take 1 capsule by mouth daily with supper.    . nitroGLYCERIN (NITROSTAT) 0.4 MG SL tablet Place 1 tablet (0.4 mg total) under the tongue every 5 (five) minutes as needed for chest pain. 20 tablet 1  . Polyethyl Glycol-Propyl Glycol (SYSTANE OP) Place 1 drop into both eyes 4 (four) times daily as needed (dry eyes).    . traMADol (ULTRAM) 50 MG tablet TAKE 1 TABLET BY MOUTH EVERY 8 HOURS AS NEEDED 90 tablet 3  . traZODone (DESYREL) 150 MG tablet Take 150 mg by mouth at bedtime.     No current facility-administered medications for this visit.     Family History  Problem Relation Age of Onset  . Dementia Father    . Diabetes Mother   . Peripheral vascular disease Brother   . Colon cancer Sister        dx in her 17's    Social History   Socioeconomic History  . Marital status: Married    Spouse name: Not on file  . Number of children: 3  . Years of education: Not on file  . Highest education level: Not on file  Occupational History  . Occupation: Retired-Construction  Social Needs  . Financial resource strain: Not on file  . Food insecurity:    Worry: Not on file    Inability: Not on file  . Transportation needs:    Medical: Not on file    Non-medical: Not on file  Tobacco Use  . Smoking status: Former Smoker    Years: 20.00    Types: Cigarettes  . Smokeless tobacco: Never Used  . Tobacco comment: Daily caffeine use, regular exercise  Substance and Sexual Activity  . Alcohol use: Yes    Alcohol/week: 7.0 - 14.0 standard drinks    Types: 7 - 14 Standard drinks or equivalent per week    Comment: occ beer  . Drug use: No  . Sexual activity: Yes  Lifestyle  . Physical activity:    Days per week: Not on file    Minutes per session: Not on file  . Stress: Not on file  Relationships  . Social connections:    Talks on phone: Not on file    Gets together: Not on file    Attends religious service: Not on file    Active member of club or organization: Not on file    Attends meetings of clubs or organizations: Not on file    Relationship status: Not on file  . Intimate partner violence:    Fear of current or ex partner: Not on file    Emotionally abused: Not on file    Physically abused: Not on file    Forced sexual activity: Not on file  Other Topics Concern  . Not on file  Social History Narrative  . Not on file     REVIEW OF SYSTEMS:   [X]  denotes positive finding, [ ]  denotes negative finding Cardiac  Comments:  Chest pain or chest pressure:    Shortness of breath upon exertion:    Short of breath when lying flat:    Irregular heart rhythm:        Vascular      Pain in calf, thigh, or hip brought  on by ambulation:    Pain in feet at night that wakes you up from your sleep:     Blood clot in your veins:    Leg swelling:         Pulmonary    Oxygen at home:    Productive cough:     Wheezing:         Neurologic    Sudden weakness in arms or legs:     Sudden numbness in arms or legs:     Sudden onset of difficulty speaking or slurred speech:    Temporary loss of vision in one eye:     Problems with dizziness:         Gastrointestinal    Blood in stool:     Vomited blood:         Genitourinary    Burning when urinating:     Blood in urine:        Psychiatric    Major depression:         Hematologic    Bleeding problems:    Problems with blood clotting too easily:        Skin    Rashes or ulcers:        Constitutional    Fever or chills:      PHYSICAL EXAMINATION:  Today's Vitals   07/22/18 0950 07/22/18 0953  BP: 117/76 115/73  Pulse: 62   Resp: 20   Temp: (!) 97.4 F (36.3 C)   SpO2: 94%   Weight: 209 lb (94.8 kg)   Height: 5\' 11"  (1.803 m)    Body mass index is 29.15 kg/m.   General:  WDWN in NAD; vital signs documented above Gait: Not observed HENT: WNL, normocephalic Pulmonary: normal non-labored breathing , without Rales, rhonchi,  wheezing Cardiac: regular HR, without  Murmurs, rubs or gallops; without carotid bruits Abdomen: soft, NT, no masses Skin: without rashes Vascular Exam/Pulses:  Right Left  Radial 2+ (normal) 2+ (normal)  Ulnar Unable to palpate  Unable to palpate   DP 2+ (normal) 1+ (weak)  PT 2+ (normal) Unable to palpate    Extremities: without ischemic changes, without Gangrene , without cellulitis; without open wounds;  Musculoskeletal: no muscle wasting or atrophy  Neurologic: A&O X 3 Psychiatric:  The pt has Normal affect.   Non-Invasive Vascular Imaging:   Carotid Duplex on 07/22/2018: Right:  60-79% ICA stenosis Left:  1-39% ICA stenosis Essentially  unchanged.  Previous Carotid duplex on 01/04/18: Right: 60-79% stenosis Left:   1-39% stenosis    ASSESSMENT/PLAN:: 85 y.o. male here for follow up carotid artery stenosis.   -pt doing well and remains asymptomatic.    -discussed s/s of stroke with pt and they understand should they develop any of these sx, they will go to the nearest ER. -continue asa/Repatha -he will f/u in 6 months with duplex.  He will call sooner should he have any issues.  -discussed with him getting help and being very careful up on the roof as his knees are weak.  Leontine Locket, PA-C [facility] [phone]  Clinic MD:  Oneida Alar

## 2018-07-22 ENCOUNTER — Ambulatory Visit (HOSPITAL_COMMUNITY)
Admission: RE | Admit: 2018-07-22 | Discharge: 2018-07-22 | Disposition: A | Discharge status: Home / Self Care | Payer: PPO | Source: Ambulatory Visit | Attending: Family | Admitting: Family

## 2018-07-22 ENCOUNTER — Ambulatory Visit (INDEPENDENT_AMBULATORY_CARE_PROVIDER_SITE_OTHER): Payer: PPO | Admitting: Physician Assistant

## 2018-07-22 ENCOUNTER — Other Ambulatory Visit: Payer: Self-pay

## 2018-07-22 ENCOUNTER — Encounter: Payer: Self-pay | Admitting: Family

## 2018-07-22 VITALS — BP 115/73 | HR 62 | Temp 97.4°F | Resp 20 | Ht 71.0 in | Wt 209.0 lb

## 2018-07-22 DIAGNOSIS — I6523 Occlusion and stenosis of bilateral carotid arteries: Secondary | ICD-10-CM

## 2018-08-12 DIAGNOSIS — H02135 Senile ectropion of left lower eyelid: Secondary | ICD-10-CM | POA: Diagnosis not present

## 2018-08-12 DIAGNOSIS — E7800 Pure hypercholesterolemia, unspecified: Secondary | ICD-10-CM | POA: Insufficient documentation

## 2018-08-12 DIAGNOSIS — H0231 Blepharochalasis right upper eyelid: Secondary | ICD-10-CM | POA: Diagnosis not present

## 2018-08-12 DIAGNOSIS — H019 Unspecified inflammation of eyelid: Secondary | ICD-10-CM | POA: Diagnosis not present

## 2018-08-12 DIAGNOSIS — H0234 Blepharochalasis left upper eyelid: Secondary | ICD-10-CM | POA: Diagnosis not present

## 2018-08-12 DIAGNOSIS — H02115 Cicatricial ectropion of left lower eyelid: Secondary | ICD-10-CM | POA: Diagnosis not present

## 2018-08-25 DIAGNOSIS — H353131 Nonexudative age-related macular degeneration, bilateral, early dry stage: Secondary | ICD-10-CM | POA: Diagnosis not present

## 2018-08-25 DIAGNOSIS — H04123 Dry eye syndrome of bilateral lacrimal glands: Secondary | ICD-10-CM | POA: Diagnosis not present

## 2018-08-25 DIAGNOSIS — H02125 Mechanical ectropion of left lower eyelid: Secondary | ICD-10-CM | POA: Diagnosis not present

## 2018-08-30 DIAGNOSIS — H353131 Nonexudative age-related macular degeneration, bilateral, early dry stage: Secondary | ICD-10-CM | POA: Diagnosis not present

## 2018-08-30 DIAGNOSIS — H02125 Mechanical ectropion of left lower eyelid: Secondary | ICD-10-CM | POA: Diagnosis not present

## 2018-08-30 DIAGNOSIS — H04123 Dry eye syndrome of bilateral lacrimal glands: Secondary | ICD-10-CM | POA: Diagnosis not present

## 2018-09-22 DIAGNOSIS — S0502XA Injury of conjunctiva and corneal abrasion without foreign body, left eye, initial encounter: Secondary | ICD-10-CM | POA: Diagnosis not present

## 2018-09-22 DIAGNOSIS — H02135 Senile ectropion of left lower eyelid: Secondary | ICD-10-CM | POA: Diagnosis not present

## 2018-09-22 DIAGNOSIS — H16212 Exposure keratoconjunctivitis, left eye: Secondary | ICD-10-CM | POA: Diagnosis not present

## 2018-09-22 DIAGNOSIS — H02125 Mechanical ectropion of left lower eyelid: Secondary | ICD-10-CM | POA: Diagnosis not present

## 2018-09-22 DIAGNOSIS — H25813 Combined forms of age-related cataract, bilateral: Secondary | ICD-10-CM | POA: Diagnosis not present

## 2018-09-22 DIAGNOSIS — H02883 Meibomian gland dysfunction of right eye, unspecified eyelid: Secondary | ICD-10-CM | POA: Diagnosis not present

## 2018-09-22 DIAGNOSIS — Z48810 Encounter for surgical aftercare following surgery on the sense organs: Secondary | ICD-10-CM | POA: Diagnosis not present

## 2018-09-22 DIAGNOSIS — B0050 Herpesviral ocular disease, unspecified: Secondary | ICD-10-CM | POA: Diagnosis not present

## 2018-09-22 DIAGNOSIS — H02886 Meibomian gland dysfunction of left eye, unspecified eyelid: Secondary | ICD-10-CM | POA: Diagnosis not present

## 2018-09-22 DIAGNOSIS — H168 Other keratitis: Secondary | ICD-10-CM | POA: Diagnosis not present

## 2018-09-22 DIAGNOSIS — H04123 Dry eye syndrome of bilateral lacrimal glands: Secondary | ICD-10-CM | POA: Diagnosis not present

## 2018-09-29 ENCOUNTER — Ambulatory Visit: Payer: PPO

## 2018-09-29 ENCOUNTER — Ambulatory Visit: Payer: PPO | Admitting: Internal Medicine

## 2018-09-29 DIAGNOSIS — H02135 Senile ectropion of left lower eyelid: Secondary | ICD-10-CM | POA: Diagnosis not present

## 2018-09-29 DIAGNOSIS — Z79899 Other long term (current) drug therapy: Secondary | ICD-10-CM | POA: Diagnosis not present

## 2018-09-29 DIAGNOSIS — H25813 Combined forms of age-related cataract, bilateral: Secondary | ICD-10-CM | POA: Diagnosis not present

## 2018-09-29 DIAGNOSIS — Z9889 Other specified postprocedural states: Secondary | ICD-10-CM | POA: Diagnosis not present

## 2018-09-29 DIAGNOSIS — B0050 Herpesviral ocular disease, unspecified: Secondary | ICD-10-CM | POA: Diagnosis not present

## 2018-09-29 DIAGNOSIS — H16212 Exposure keratoconjunctivitis, left eye: Secondary | ICD-10-CM | POA: Diagnosis not present

## 2018-09-29 DIAGNOSIS — H02125 Mechanical ectropion of left lower eyelid: Secondary | ICD-10-CM | POA: Diagnosis not present

## 2018-09-29 DIAGNOSIS — H16002 Unspecified corneal ulcer, left eye: Secondary | ICD-10-CM | POA: Diagnosis not present

## 2018-09-29 DIAGNOSIS — H04123 Dry eye syndrome of bilateral lacrimal glands: Secondary | ICD-10-CM | POA: Diagnosis not present

## 2018-10-01 DIAGNOSIS — Z79899 Other long term (current) drug therapy: Secondary | ICD-10-CM | POA: Diagnosis not present

## 2018-10-01 DIAGNOSIS — H16002 Unspecified corneal ulcer, left eye: Secondary | ICD-10-CM | POA: Diagnosis not present

## 2018-10-01 DIAGNOSIS — H02135 Senile ectropion of left lower eyelid: Secondary | ICD-10-CM | POA: Diagnosis not present

## 2018-10-01 DIAGNOSIS — Z9889 Other specified postprocedural states: Secondary | ICD-10-CM | POA: Diagnosis not present

## 2018-10-01 DIAGNOSIS — H1820 Unspecified corneal edema: Secondary | ICD-10-CM | POA: Diagnosis not present

## 2018-10-01 DIAGNOSIS — H25813 Combined forms of age-related cataract, bilateral: Secondary | ICD-10-CM | POA: Diagnosis not present

## 2018-10-04 DIAGNOSIS — H25813 Combined forms of age-related cataract, bilateral: Secondary | ICD-10-CM | POA: Diagnosis not present

## 2018-10-04 DIAGNOSIS — B0052 Herpesviral keratitis: Secondary | ICD-10-CM | POA: Diagnosis not present

## 2018-10-04 DIAGNOSIS — H11232 Symblepharon, left eye: Secondary | ICD-10-CM | POA: Diagnosis not present

## 2018-10-04 DIAGNOSIS — H02135 Senile ectropion of left lower eyelid: Secondary | ICD-10-CM | POA: Diagnosis not present

## 2018-10-04 DIAGNOSIS — Z9889 Other specified postprocedural states: Secondary | ICD-10-CM | POA: Diagnosis not present

## 2018-10-04 DIAGNOSIS — Z7952 Long term (current) use of systemic steroids: Secondary | ICD-10-CM | POA: Diagnosis not present

## 2018-10-04 DIAGNOSIS — H179 Unspecified corneal scar and opacity: Secondary | ICD-10-CM | POA: Diagnosis not present

## 2018-10-04 DIAGNOSIS — Z79899 Other long term (current) drug therapy: Secondary | ICD-10-CM | POA: Diagnosis not present

## 2018-10-04 DIAGNOSIS — H16002 Unspecified corneal ulcer, left eye: Secondary | ICD-10-CM | POA: Diagnosis not present

## 2018-10-05 DIAGNOSIS — H25813 Combined forms of age-related cataract, bilateral: Secondary | ICD-10-CM | POA: Diagnosis not present

## 2018-10-05 DIAGNOSIS — H02135 Senile ectropion of left lower eyelid: Secondary | ICD-10-CM | POA: Diagnosis not present

## 2018-10-05 DIAGNOSIS — Z79899 Other long term (current) drug therapy: Secondary | ICD-10-CM | POA: Diagnosis not present

## 2018-10-05 DIAGNOSIS — H179 Unspecified corneal scar and opacity: Secondary | ICD-10-CM | POA: Diagnosis not present

## 2018-10-08 DIAGNOSIS — H179 Unspecified corneal scar and opacity: Secondary | ICD-10-CM | POA: Diagnosis not present

## 2018-10-08 DIAGNOSIS — B0052 Herpesviral keratitis: Secondary | ICD-10-CM | POA: Diagnosis not present

## 2018-10-08 DIAGNOSIS — H02135 Senile ectropion of left lower eyelid: Secondary | ICD-10-CM | POA: Diagnosis not present

## 2018-10-08 DIAGNOSIS — H25813 Combined forms of age-related cataract, bilateral: Secondary | ICD-10-CM | POA: Diagnosis not present

## 2018-10-15 DIAGNOSIS — B0052 Herpesviral keratitis: Secondary | ICD-10-CM | POA: Diagnosis not present

## 2018-10-15 DIAGNOSIS — H16002 Unspecified corneal ulcer, left eye: Secondary | ICD-10-CM | POA: Diagnosis not present

## 2018-10-15 DIAGNOSIS — H25813 Combined forms of age-related cataract, bilateral: Secondary | ICD-10-CM | POA: Diagnosis not present

## 2018-10-15 DIAGNOSIS — H179 Unspecified corneal scar and opacity: Secondary | ICD-10-CM | POA: Diagnosis not present

## 2018-10-22 ENCOUNTER — Other Ambulatory Visit: Payer: Self-pay

## 2018-10-22 DIAGNOSIS — H179 Unspecified corneal scar and opacity: Secondary | ICD-10-CM | POA: Diagnosis not present

## 2018-10-22 DIAGNOSIS — H25813 Combined forms of age-related cataract, bilateral: Secondary | ICD-10-CM | POA: Diagnosis not present

## 2018-10-22 DIAGNOSIS — H02135 Senile ectropion of left lower eyelid: Secondary | ICD-10-CM | POA: Diagnosis not present

## 2018-10-22 DIAGNOSIS — B0052 Herpesviral keratitis: Secondary | ICD-10-CM | POA: Diagnosis not present

## 2018-10-22 DIAGNOSIS — H16002 Unspecified corneal ulcer, left eye: Secondary | ICD-10-CM | POA: Diagnosis not present

## 2018-10-22 MED ORDER — LOSARTAN POTASSIUM 25 MG PO TABS: 25.0000 mg | ORAL_TABLET | Freq: Every day | ORAL | 3 refills | Status: DC

## 2018-11-02 ENCOUNTER — Other Ambulatory Visit: Payer: Self-pay

## 2018-11-02 ENCOUNTER — Encounter: Payer: Self-pay | Admitting: Internal Medicine

## 2018-11-02 ENCOUNTER — Ambulatory Visit (INDEPENDENT_AMBULATORY_CARE_PROVIDER_SITE_OTHER): Payer: PPO | Admitting: Internal Medicine

## 2018-11-02 DIAGNOSIS — I10 Essential (primary) hypertension: Secondary | ICD-10-CM

## 2018-11-02 DIAGNOSIS — H9390 Unspecified disorder of ear, unspecified ear: Secondary | ICD-10-CM | POA: Diagnosis not present

## 2018-11-02 DIAGNOSIS — E538 Deficiency of other specified B group vitamins: Secondary | ICD-10-CM

## 2018-11-02 DIAGNOSIS — E785 Hyperlipidemia, unspecified: Secondary | ICD-10-CM

## 2018-11-02 MED ORDER — MUPIROCIN 2 % EX OINT: TOPICAL_OINTMENT | CUTANEOUS | 0 refills | Status: DC

## 2018-11-02 MED ORDER — DOXYCYCLINE HYCLATE 100 MG PO TABS: 100.0000 mg | ORAL_TABLET | Freq: Two times a day (BID) | ORAL | 0 refills | Status: DC

## 2018-11-02 NOTE — Assessment & Plan Note (Signed)
On B12 

## 2018-11-02 NOTE — Progress Notes (Signed)
Subjective:  Patient ID: Richard Burgess, male    DOB: 26-May-1929  Age: 85 y.o. MRN: [id number]  CC: No chief complaint on file.   HPI Richard Burgess presents for R earache x days - a bleeding lesion; not getting better - he was using antifungal cream... C/o L eye lower eyelid complications from surgery F/u HTN, CAD   Outpatient Medications Prior to Visit  Medication Sig Dispense Refill  . aspirin EC 81 MG tablet Take 81 mg by mouth daily.    . cholecalciferol (VITAMIN D) 1000 units tablet Take 1,000 Units by mouth daily.    . cyanocobalamin 500 MCG tablet Take 500 mcg by mouth daily. Vitamin B12    . Evolocumab with Infusor ([place]) 420 MG/3.5ML SOCT Inject 420 mg into the skin every 30 (thirty) days. 1 Cartridge 12  . isosorbide mononitrate (IMDUR) 60 MG 24 hr tablet Take 1 tablet (60 mg total) by mouth daily. 90 tablet 3  . linaclotide (LINZESS) 290 MCG CAPS capsule Take 1 capsule (290 mcg total) by mouth daily as needed. 30 capsule 11  . losartan (COZAAR) 25 MG tablet Take 1 tablet (25 mg total) by mouth daily. 90 tablet 3  . metoprolol succinate (TOPROL XL) 25 MG 24 hr tablet Take 1 tablet (25 mg total) by mouth daily. 90 tablet 3  . Multiple Vitamins-Minerals (PRESERVISION AREDS 2) CAPS Take 1 capsule by mouth daily with supper.    . nitroGLYCERIN (NITROSTAT) 0.4 MG SL tablet Place 1 tablet (0.4 mg total) under the tongue every 5 (five) minutes as needed for chest pain. 20 tablet 1  . Polyethyl Glycol-Propyl Glycol (SYSTANE OP) Place 1 drop into both eyes 4 (four) times daily as needed (dry eyes).    . traMADol (ULTRAM) 50 MG tablet TAKE 1 TABLET BY MOUTH EVERY 8 HOURS AS NEEDED 90 tablet 3  . traZODone (DESYREL) 150 MG tablet Take 150 mg by mouth at bedtime.     No facility-administered medications prior to visit.     ROS: Review of Systems  Constitutional: Negative for appetite change, fatigue and unexpected weight change.  HENT: Positive for ear pain.  Negative for congestion, nosebleeds, sneezing, sore throat and trouble swallowing.   Eyes: Positive for pain and redness. Negative for itching and visual disturbance.  Respiratory: Negative for cough.   Cardiovascular: Negative for chest pain, palpitations and leg swelling.  Gastrointestinal: Negative for abdominal distention, blood in stool, diarrhea and nausea.  Genitourinary: Negative for frequency and hematuria.  Musculoskeletal: Positive for arthralgias. Negative for back pain, gait problem, joint swelling and neck pain.  Skin: Negative for rash.  Neurological: Negative for dizziness, tremors, speech difficulty and weakness.  Psychiatric/Behavioral: Negative for agitation, dysphoric mood and sleep disturbance. The patient is not nervous/anxious.     Objective:  BP 124/76 (BP Location: Left Arm, Patient Position: Sitting, Cuff Size: Large)   Pulse 88   Temp 98.3 F (36.8 C) (Oral)   Ht 5\' 11"  (1.803 m)   Wt 211 lb (95.7 kg)   SpO2 96%   BMI 29.43 kg/m   BP Readings from Last 3 Encounters:  11/02/18 124/76  07/22/18 115/73  06/03/18 130/60    Wt Readings from Last 3 Encounters:  11/02/18 211 lb (95.7 kg)  07/22/18 209 lb (94.8 kg)  06/03/18 209 lb 9.6 oz (95.1 kg)    Physical Exam Constitutional:      General: He is not in acute distress.    Appearance: He is  well-developed.     Comments: NAD  HENT:     Right Ear: Tympanic membrane and ear canal normal.     Left Ear: Tympanic membrane, ear canal and external ear normal.     Nose: Nose normal.  Eyes:     Conjunctiva/sclera: Conjunctivae normal.     Pupils: Pupils are equal, round, and reactive to light.  Neck:     Musculoskeletal: Normal range of motion.     Thyroid: No thyromegaly.     Vascular: No JVD.  Cardiovascular:     Rate and Rhythm: Normal rate and regular rhythm.     Heart sounds: Normal heart sounds. No murmur. No friction rub. No gallop.   Pulmonary:     Effort: Pulmonary effort is normal. No  respiratory distress.     Breath sounds: Normal breath sounds. No wheezing or rales.  Chest:     Chest wall: No tenderness.  Abdominal:     General: Bowel sounds are normal. There is no distension.     Palpations: Abdomen is soft. There is no mass.     Tenderness: There is no abdominal tenderness. There is no guarding or rebound.  Musculoskeletal: Normal range of motion.        General: No tenderness.  Lymphadenopathy:     Cervical: No cervical adenopathy.  Skin:    General: Skin is warm and dry.     Findings: Lesion present. No rash.  Neurological:     Mental Status: He is alert and oriented to person, place, and time.     Cranial Nerves: No cranial nerve deficit.     Motor: No abnormal muscle tone.     Coordination: Coordination normal.     Gait: Gait normal.     Deep Tendon Reflexes: Reflexes are normal and symmetric.  Psychiatric:        Behavior: Behavior normal.        Thought Content: Thought content normal.        Judgment: Judgment normal.    R ear antehelix lesion w/ erythema, bleeding  Lab Results  Component Value Date   WBC 5.6 04/10/2017   HGB 12.4 (L) 04/10/2017   HCT 37.1 (L) 04/10/2017   PLT 180 04/10/2017   GLUCOSE 159 (H) 05/31/2018   CHOL 102 05/31/2018   TRIG 136.0 05/31/2018   HDL 41.60 05/31/2018   LDLDIRECT 153.0 02/18/2016   LDLCALC 33 05/31/2018   ALT 12 05/31/2018   AST 14 05/31/2018   NA 138 05/31/2018   K 4.6 05/31/2018   CL 104 05/31/2018   CREATININE 1.05 05/31/2018   BUN 17 05/31/2018   CO2 27 05/31/2018   TSH 2.88 02/18/2016   PSA 0.28 03/23/2014   INR 0.97 04/10/2017   HGBA1C 5.9 (H) 01/25/2017   MICROALBUR 0.2 12/13/2007    Vas US Carotid  Result Date: 07/22/2018 Carotid Arterial Duplex Study Indications:       Carotid artery disease. Risk Factors:      Hypertension, hyperlipidemia, past history of smoking, PAD. Comparison Study:  No significant change when compared to previous study on                    01/04/2018.  Performing Technologist: Burley Saver RVT  Examination Guidelines: A complete evaluation includes B-mode imaging, spectral Doppler, color Doppler, and power Doppler as needed of all accessible portions of each vessel. Bilateral testing is considered an integral part of a complete examination. Limited examinations for reoccurring indications may be performed  as noted.  Right Carotid Findings: +----------+--------+--------+--------+-------------------------+--------+           PSV cm/sEDV cm/sStenosisDescribe                 Comments +----------+--------+--------+--------+-------------------------+--------+ CCA Prox  54      14      <50%    heterogenous                      +----------+--------+--------+--------+-------------------------+--------+ CCA Mid   67      13      <50%    heterogenous                      +----------+--------+--------+--------+-------------------------+--------+ CCA Distal60      14                                                +----------+--------+--------+--------+-------------------------+--------+ ICA Prox  267     92      60-79%  heterogenous and calcific         +----------+--------+--------+--------+-------------------------+--------+ ICA Mid   68      18      1-39%   heterogenous                      +----------+--------+--------+--------+-------------------------+--------+ ICA Distal44      16                                                +----------+--------+--------+--------+-------------------------+--------+ ECA       85      11              heterogenous                      +----------+--------+--------+--------+-------------------------+--------+ +----------+--------+-------+---------+-------------------+           PSV cm/sEDV cmsDescribe Arm Pressure (mmHG) +----------+--------+-------+---------+-------------------+ [id number]            Turbulent                     +----------+--------+-------+---------+-------------------+ +---------+--------+--+--------+--+-------------------------------------------+ VertebralPSV cm/s55EDV cm/s13Antegrade, low amplitude and High resistant +---------+--------+--+--------+--+-------------------------------------------+  Left Carotid Findings: +----------+--------+--------+--------+------------+--------+           PSV cm/sEDV cm/sStenosisDescribe    Comments +----------+--------+--------+--------+------------+--------+ CCA Prox  69      20                                   +----------+--------+--------+--------+------------+--------+ CCA Mid   70      20      <50%    homogeneous          +----------+--------+--------+--------+------------+--------+ CCA Distal45      14      <50%    calcific             +----------+--------+--------+--------+------------+--------+ ICA Prox  42      17      1-39%   heterogenous         +----------+--------+--------+--------+------------+--------+ ICA Mid   65      25                                   +----------+--------+--------+--------+------------+--------+  ICA Distal47      13                                   +----------+--------+--------+--------+------------+--------+ ECA       49      10                                   +----------+--------+--------+--------+------------+--------+ +----------+--------+--------+----------------+-------------------+ SubclavianPSV cm/sEDV cm/sDescribe        Arm Pressure (mmHG) +----------+--------+--------+----------------+-------------------+           124             Multiphasic, WNL                    +----------+--------+--------+----------------+-------------------+ +---------+--------+--+--------+--+---------+ VertebralPSV cm/s56EDV cm/s21Antegrade +---------+--------+--+--------+--+---------+  Summary: Right Carotid: Velocities in the right ICA are consistent with a 60-79%                 stenosis. Non-hemodynamically significant plaque <50% noted in                the CCA. The ECA appears <50% stenosed. Left Carotid: Velocities in the left ICA are consistent with a 1-39% stenosis.               Non-hemodynamically significant plaque noted in the CCA. Vertebrals:  Left vertebral artery demonstrates antegrade flow. Right vertebral              artery demonstrates high resistant flow. Subclavians: Right subclavian artery flow was disturbed. Normal flow              hemodynamics were seen in the left subclavian artery. *See table(s) above for measurements and observations.  Electronically signed by Ruta Hinds MD on 07/22/2018 at 1:47:48 PM.    Final     Assessment & Plan:   There are no diagnoses linked to this encounter.   No orders of the defined types were placed in this encounter.    Follow-up: No follow-ups on file.  [name], MD

## 2018-11-02 NOTE — Assessment & Plan Note (Addendum)
R ear antehelix lesion - ENT ref Bactroban Doxy po

## 2018-11-02 NOTE — Assessment & Plan Note (Signed)
Losartan, Toprol, Isosorbide

## 2018-11-02 NOTE — Assessment & Plan Note (Signed)
F/u w/Lipid clinic

## 2018-11-04 DIAGNOSIS — L989 Disorder of the skin and subcutaneous tissue, unspecified: Secondary | ICD-10-CM | POA: Diagnosis not present

## 2018-11-05 DIAGNOSIS — H179 Unspecified corneal scar and opacity: Secondary | ICD-10-CM | POA: Diagnosis not present

## 2018-11-05 DIAGNOSIS — B0052 Herpesviral keratitis: Secondary | ICD-10-CM | POA: Diagnosis not present

## 2018-11-05 DIAGNOSIS — H02135 Senile ectropion of left lower eyelid: Secondary | ICD-10-CM | POA: Diagnosis not present

## 2018-11-08 DIAGNOSIS — H25813 Combined forms of age-related cataract, bilateral: Secondary | ICD-10-CM | POA: Diagnosis not present

## 2018-11-08 DIAGNOSIS — H02135 Senile ectropion of left lower eyelid: Secondary | ICD-10-CM | POA: Diagnosis not present

## 2018-11-08 DIAGNOSIS — H179 Unspecified corneal scar and opacity: Secondary | ICD-10-CM | POA: Diagnosis not present

## 2018-12-03 DIAGNOSIS — H02135 Senile ectropion of left lower eyelid: Secondary | ICD-10-CM | POA: Diagnosis not present

## 2018-12-15 ENCOUNTER — Ambulatory Visit: Payer: No Typology Code available for payment source | Admitting: Internal Medicine

## 2018-12-15 ENCOUNTER — Ambulatory Visit: Payer: No Typology Code available for payment source

## 2018-12-19 NOTE — Progress Notes (Signed)
[name] [place],  [address] Phone: [phone] Subjective:   Fontaine No, am serving as a scribe for Dr. Hulan Saas.   CC: Knee pain bilateral    [name]  Richard Burgess is a 85 y.o. male coming in with complaint of bilateral knee pain. Has had injections from Dr. Raeford Razor. Patient feels more of a stiffness than pain in his knees. Has trouble descending stairs.  Patient describes the pain as a dull, throbbing aching pain.  Wakes him up at night.  Patient states only mild increasing instability recently. Left knee seems to be the worsening aspect.  Right knee seems to be doing okay overall.  Left knee does what is affecting his daily activities.  Patient is also complaining of left shoulder pain.  States that it is waking him up at night.  Patient was seen for something similar 2 years ago and feels like it is about the same.  At that time patient did have a injection and was doing well.       Past Medical History:  Diagnosis Date  . Allergy   . BPH (benign prostatic hypertrophy)   . Carotid artery occlusion   . Diverticulosis of colon   . ED (erectile dysfunction)   . GERD (gastroesophageal reflux disease)   . Glucose intolerance (impaired glucose tolerance)   . History of pancreatitis   . Hx-TIA (transient ischemic attack)   . Hyperlipidemia   . Hypertension   . Hypertention, malignant, with acute intensive management   . Low back pain   . Osteoarthritis   . PVD (peripheral vascular disease) ([place])    Bilateral carotid  . Tubular adenoma of colon 08/1991   One with carcinoma IN [facility] 1993, multiple adenomatous   Past Surgical History:  Procedure Laterality Date  . APPENDECTOMY    . BASAL CELL CARCINOMA EXCISION    . CHOLECYSTECTOMY    . COLONOSCOPY    . ESOPHAGOGASTRODUODENOSCOPY    . LEFT HEART CATH AND CORONARY ANGIOGRAPHY N/A 01/26/2017   Procedure: LEFT HEART CATH AND CORONARY ANGIOGRAPHY;  Surgeon:  Richard Harp, MD;  Location: [facility];  Service: Cardiovascular;  Laterality: N/A;  . LUMBAR SPINE SURGERY     X4   Social History   Socioeconomic History  . Marital status: Married    Spouse name: Not on file  . Number of children: 3  . Years of education: Not on file  . Highest education level: Not on file  Occupational History  . Occupation: Retired-Construction  Social Needs  . Financial resource strain: Not on file  . Food insecurity    Worry: Not on file    Inability: Not on file  . Transportation needs    Medical: Not on file    Non-medical: Not on file  Tobacco Use  . Smoking status: Former Smoker    Years: 20.00    Types: Cigarettes  . Smokeless tobacco: Never Used  . Tobacco comment: Daily caffeine use, regular exercise  Substance and Sexual Activity  . Alcohol use: Yes    Alcohol/week: 7.0 - 14.0 standard drinks    Types: 7 - 14 Standard drinks or equivalent per week    Comment: occ beer  . Drug use: No  . Sexual activity: Yes  Lifestyle  . Physical activity    Days per week: Not on file    Minutes per session: Not on file  . Stress: Not on file  Relationships  . Social Herbalist on phone: Not on file    Gets together: Not on file    Attends religious service: Not on file    Active member of club or organization: Not on file    Attends meetings of clubs or organizations: Not on file    Relationship status: Not on file  Other Topics Concern  . Not on file  Social History Narrative  . Not on file   Allergies  Allergen Reactions  . Amlodipine Other (See Comments)    Weakness in legs  . Livalo [Pitavastatin]     Pain, SOB  . Losartan Other (See Comments)    Leg numbness/stiffness.   . Oxycodone Other (See Comments)    crazy  . Pravastatin Sodium Other (See Comments)    REACTION: bad dreams  . Zetia [Ezetimibe]     LEG PAIN   . Rosuvastatin Palpitations  . Statins Palpitations and Other (See Comments)    REACTION:  aches, bad dreams   Family History  Problem Relation Age of Onset  . Dementia Father   . Diabetes Mother   . Peripheral vascular disease Brother   . Colon cancer Sister        dx in her 28's     Current Outpatient Medications (Cardiovascular):  Marland Kitchen  Evolocumab with Infusor ([place]) 420 MG/3.5ML SOCT, Inject 420 mg into the skin every 30 (thirty) days. .  isosorbide mononitrate (IMDUR) 60 MG 24 hr tablet, Take 1 tablet (60 mg total) by mouth daily. Marland Kitchen  losartan (COZAAR) 25 MG tablet, Take 1 tablet (25 mg total) by mouth daily. .  metoprolol succinate (TOPROL XL) 25 MG 24 hr tablet, Take 1 tablet (25 mg total) by mouth daily. .  nitroGLYCERIN (NITROSTAT) 0.4 MG SL tablet, Place 1 tablet (0.4 mg total) under the tongue every 5 (five) minutes as needed for chest pain.   Current Outpatient Medications (Analgesics):  .  aspirin EC 81 MG tablet, Take 81 mg by mouth daily. .  traMADol (ULTRAM) 50 MG tablet, TAKE 1 TABLET BY MOUTH EVERY 8 HOURS AS NEEDED  Current Outpatient Medications (Hematological):  .  cyanocobalamin 500 MCG tablet, Take 500 mcg by mouth daily. Vitamin B12  Current Outpatient Medications (Other):  .  cholecalciferol (VITAMIN D) 1000 units tablet, Take 1,000 Units by mouth daily. Marland Kitchen  doxycycline (VIBRA-TABS) 100 MG tablet, Take 1 tablet (100 mg total) by mouth 2 (two) times daily. Marland Kitchen  linaclotide (LINZESS) 290 MCG CAPS capsule, Take 1 capsule (290 mcg total) by mouth daily as needed. .  Multiple Vitamins-Minerals (PRESERVISION AREDS 2) CAPS, Take 1 capsule by mouth daily with supper. .  mupirocin ointment ([place]) 2 %, On leg wound w/dressing change qd or bid .  Polyethyl Glycol-Propyl Glycol (SYSTANE OP), Place 1 drop into both eyes 4 (four) times daily as needed (dry eyes). .  traZODone (DESYREL) 150 MG tablet, Take 150 mg by mouth at bedtime.    Past medical history, social, surgical and family history all reviewed in electronic medical record.  No  pertanent information unless stated regarding to the chief complaint.   Review of Systems:  No headache, visual changes, nausea, vomiting, diarrhea, constipation, dizziness, abdominal pain, skin rash, fevers, chills, night sweats, weight loss, swollen lymph nodes, body aches, joint swelling, chest pain, shortness of breath, mood changes.  Positive muscle aches  Objective  Blood pressure 122/74, pulse 71, height 5\' 11"  (1.803 m), weight 206 lb (93.4 kg),  SpO2 98 %.    General: No apparent distress alert and oriented x3 mood and affect normal, dressed appropriately.  HEENT: Pupils equal, extraocular movements intact  Respiratory: Patient's speak in full sentences and does not appear short of breath  Cardiovascular: No lower extremity edema, non tender, no erythema  Skin: Warm dry intact with no signs of infection or rash on extremities or on axial skeleton.  Abdomen: Soft nontender  Neuro: Cranial nerves II through XII are intact, neurovascularly intact in all extremities with 2+ DTRs and 2+ pulses.  Lymph: No lymphadenopathy of posterior or anterior cervical chain or axillae bilaterally.  Gait antalgic MSK:  tender with limited range of motion and good stability and symmetric strength and tone of, elbows, wrist, hip, and ankles bilaterally.   Knee: bilateral  valgus deformity noted. Large thigh to calf ratio.  Tender to palpation over medial and PF joint line.  ROM full in flexion and extension and lower leg rotation. instability with valgus force.  painful patellar compression. Patellar glide with moderate crepitus. Patellar and quadriceps tendons unremarkable. Hamstring and quadriceps strength is normal.  Left shoulder exam shows some mild osteoarthritic changes.  Patient does have tenderness to palpation over the acromioclavicular joint on the left side.  Positive crossover.  Rotator cuff 4 out of 5 strength but symmetric to the contralateral side.  After informed written and verbal  consent, patient was seated on exam table. Right knee was prepped with alcohol swab and utilizing anterolateral approach, patient's right knee space was injected with 4:1  marcaine 0.5%: Kenalog 40mg /dL. Patient tolerated the procedure well without immediate complications.  After informed written and verbal consent, patient was seated on exam table. Left knee was prepped with alcohol swab and utilizing anterolateral approach, patient's left knee space was injected with 4:1  marcaine 0.5%: Kenalog 40mg /dL. Patient tolerated the procedure well without immediate complications.  Procedure: Real-time Ultrasound Guided Injection of left acromioclavicular joint Device: GE Logiq Q7 Ultrasound guided injection is preferred based studies that show increased duration, increased effect, greater accuracy, decreased procedural pain, increased response rate, and decreased cost with ultrasound guided versus blind injection.  Verbal informed consent obtained.  Time-out conducted.  Noted no overlying erythema, induration, or other signs of local infection.  Skin prepped in a sterile fashion.  Local anesthesia: Topical Ethyl chloride.  With sterile technique and under real time ultrasound guidance: With a 25-gauge half inch with 0.5 cc of 0.5% Marcaine and 0.5 cc of Kenalog 40 mg/mL Completed without difficulty  Pain immediately resolved suggesting accurate placement of the medication.  Advised to call if fevers/chills, erythema, induration, drainage, or persistent bleeding.  Images permanently stored and available for review in the ultrasound unit.  Impression: Technically successful ultrasound guided injection.    Impression and Recommendations:     This case required medical decision making of moderate complexity. The above documentation has been reviewed and is accurate and complete Lyndal Pulley, DO       Note: This dictation was prepared with Dragon dictation along with smaller phrase technology.  Any transcriptional errors that result from this process are unintentional.

## 2018-12-20 ENCOUNTER — Ambulatory Visit (INDEPENDENT_AMBULATORY_CARE_PROVIDER_SITE_OTHER): Payer: No Typology Code available for payment source | Admitting: Family Medicine

## 2018-12-20 ENCOUNTER — Encounter: Payer: Self-pay | Admitting: Family Medicine

## 2018-12-20 ENCOUNTER — Other Ambulatory Visit: Payer: Self-pay

## 2018-12-20 ENCOUNTER — Ambulatory Visit: Payer: Self-pay

## 2018-12-20 VITALS — BP 122/74 | HR 71 | Ht 71.0 in | Wt 206.0 lb

## 2018-12-20 DIAGNOSIS — M25512 Pain in left shoulder: Secondary | ICD-10-CM

## 2018-12-20 DIAGNOSIS — G8929 Other chronic pain: Secondary | ICD-10-CM

## 2018-12-20 DIAGNOSIS — M170 Bilateral primary osteoarthritis of knee: Secondary | ICD-10-CM

## 2018-12-20 DIAGNOSIS — M19012 Primary osteoarthritis, left shoulder: Secondary | ICD-10-CM | POA: Diagnosis not present

## 2018-12-20 NOTE — Patient Instructions (Addendum)
Ice 20 minutes at end of the day Injected shoulder and knees Pennsaid 2x daily finger tip sized amount See me again in 4-5 weeks

## 2018-12-20 NOTE — Assessment & Plan Note (Signed)
Bilateral injections given.  Discussed icing regimen and home exercise.  Discussed which activities of doing which wants to avoid.  Patient is to increase activity as tolerated.  Follow-up again in 4 to 8 weeks.

## 2018-12-20 NOTE — Assessment & Plan Note (Signed)
Patient given an injection today.  Tolerated the procedure well.  Likely will do well.  Discussed icing regimen and home exercise.  Follow-up again in 4 to 8 weeks.

## 2018-12-31 ENCOUNTER — Telehealth: Payer: Self-pay | Admitting: Internal Medicine

## 2018-12-31 DIAGNOSIS — H02135 Senile ectropion of left lower eyelid: Secondary | ICD-10-CM | POA: Diagnosis not present

## 2018-12-31 NOTE — Telephone Encounter (Signed)
Richard Burgess from [facility] called to get an OK from Dr. Alain Marion for the Pt to stop taking Asprin 5-7 days prior to his surgery. Pt is having entropion repair on August 5th / please advise    Cb# [phone]

## 2018-12-31 NOTE — Telephone Encounter (Signed)
Ok Thx 

## 2019-01-03 ENCOUNTER — Telehealth: Payer: Self-pay | Admitting: Cardiovascular Disease

## 2019-01-03 DIAGNOSIS — I2510 Atherosclerotic heart disease of native coronary artery without angina pectoris: Secondary | ICD-10-CM | POA: Insufficient documentation

## 2019-01-03 DIAGNOSIS — H3530 Unspecified macular degeneration: Secondary | ICD-10-CM | POA: Insufficient documentation

## 2019-01-03 NOTE — Telephone Encounter (Signed)
[facility] center informed of below.

## 2019-01-03 NOTE — Telephone Encounter (Signed)
   Primary Cardiologist: No primary care provider on file.  Chart reviewed as part of pre-operative protocol coverage. Patient was contacted 01/03/2019 in reference to pre-operative risk assessment for pending surgery as outlined below.  Richard Burgess was last seen on 06/03/18 by Richard Burgess - has history of multivessel CAD with NSTEMI 01/2017 treated medically (100% RCA, 40% OM, 60% ostial LAD, and 80% OM1, L-R collaterals to LAD), HLD with statin intolerance, ICM EF 70-26%, chronic systolic and diastolic heart failure, hypertension, hyperlipidemia, aortic aneurysm/carotid stenosis/R subclavian steal (followed by vascular surgery).  As below, we have received request to hold ASA prior to ectropion surgery. I will route to Richard Burgess/delegate to review - - Please route response to [facility] PREOP (the pre-op pool). Thank you.  When we get reply, will plan to call patient to assess how he is doing and finalize clearance (was remaining very active & doing well at 05/2018 OV).  Richard Pitter, PA-C 01/03/2019, 11:14 AM

## 2019-01-03 NOTE — Telephone Encounter (Signed)
Recommend performing surgery on ASA if possible.   If not possible due to concerns of severe bleeding, can consider holding ASA for 7 days prior to surgery, restart as soon as safe after surgery to be determined by surgeon. I do not see a history of stent placement for patient. If he has a history of coronary stents not noted in the last cath report, I would not recommend stopping aspirin, and procedure should be performed on ASA if safe.

## 2019-01-03 NOTE — Telephone Encounter (Signed)
   Primary [place], MD  Reached out to patient to discuss how he is doing - wife says he isn't home and took a message. She gave me his daughter's cell because they are together but she did not answer. LMOM to call us back and ask for preop team.  [name], PA-C 01/03/2019, 11:49 AM

## 2019-01-03 NOTE — Telephone Encounter (Signed)
   Primary Cardiologist: Richard Latch, MD  I was able to speak with patient who affirms he is doing great without any cardiac symptoms. No interim PCI or cardiology updates. He is able to garden, walk and remain very active without any angina, SOB, palpitations, dizziness or syncope. He understands that due to his age and medical history he is at increased risk for CV complications from undergoing surgery but given past medical history and time since last visit, based on ACC/AHA guidelines, Richard Burgess would be at acceptable risk for the planned procedure without further cardiovascular testing. Per Dr. Margaretann Burgess (filling in for Dr. Oval Linsey), "Recommend performing surgery on ASA if possible. If not possible due to concerns of severe bleeding, can consider holding ASA for 7 days prior to surgery, restart as soon as safe after surgery to be determined by surgeon."  I will route this recommendation to the requesting party via [facility] fax function and remove from pre-op pool.  Please call with questions.  [name], PA-C 01/03/2019, 1:47 PM

## 2019-01-03 NOTE — Telephone Encounter (Signed)
° °  [facility] Pre-operative Risk Assessment    Request for surgical clearance:  1. What type of surgery is being performed?  Ectropion   2. When is this surgery scheduled?  01-12-19   3. What type of clearance is required (medical clearance vs. Pharmacy clearance to hold med vs. Both)? Pharmacy  4. Are there any medications that need to be held prior to surgery and how long  Aspirin  5. Practice name and name of physician performing surgery? [facility]   6. What is your office phone number [phone]   7.   What is your office fax number [phone]  8.   Anesthesia type (None, local, MAC, general) ? MAC   Glyn Ade 01/03/2019, 10:49 AM  _________________________________________________________________   (provider comments below)

## 2019-01-03 NOTE — Telephone Encounter (Signed)
Patient returned call

## 2019-01-04 ENCOUNTER — Telehealth: Payer: Self-pay | Admitting: Cardiovascular Disease

## 2019-01-04 NOTE — Telephone Encounter (Signed)
Please reference clearance done yesterday and re-fax, then follow up to make sure they received it - this included duration recommendation. Dayna Dunn PA-C

## 2019-01-04 NOTE — Telephone Encounter (Signed)
Follow Up:    They need to know if pt can stop his Aspirin 7 days prior to surgery? IHis clearance said he could stop his Aspirin, but did not say how long.  Please fax to [phone].

## 2019-01-04 NOTE — Telephone Encounter (Signed)
S/w Apolonio Schneiders from Dr. Magda Kiel office with [facility].  Did not receive fax sent yesterday.  Confirmed fax [phone] and # is [phone] will resend via Epic.

## 2019-01-06 DIAGNOSIS — H02135 Senile ectropion of left lower eyelid: Secondary | ICD-10-CM | POA: Diagnosis not present

## 2019-01-06 DIAGNOSIS — Z1159 Encounter for screening for other viral diseases: Secondary | ICD-10-CM | POA: Diagnosis not present

## 2019-01-06 DIAGNOSIS — Z01812 Encounter for preprocedural laboratory examination: Secondary | ICD-10-CM | POA: Diagnosis not present

## 2019-01-10 ENCOUNTER — Telehealth: Payer: Self-pay | Admitting: *Deleted

## 2019-01-10 NOTE — Telephone Encounter (Signed)
I spoke with Richard Burgess to schedule his follow up visit with the APP,he refused wants to wait and see Dr.Olney in December.

## 2019-01-12 DIAGNOSIS — H02135 Senile ectropion of left lower eyelid: Secondary | ICD-10-CM | POA: Diagnosis not present

## 2019-01-12 DIAGNOSIS — H10402 Unspecified chronic conjunctivitis, left eye: Secondary | ICD-10-CM | POA: Diagnosis not present

## 2019-01-12 DIAGNOSIS — H02105 Unspecified ectropion of left lower eyelid: Secondary | ICD-10-CM | POA: Diagnosis not present

## 2019-01-17 ENCOUNTER — Other Ambulatory Visit: Payer: Self-pay

## 2019-01-17 DIAGNOSIS — I6523 Occlusion and stenosis of bilateral carotid arteries: Secondary | ICD-10-CM

## 2019-01-19 ENCOUNTER — Telehealth (HOSPITAL_COMMUNITY): Payer: Self-pay | Admitting: Rehabilitation

## 2019-01-19 NOTE — Telephone Encounter (Signed)

## 2019-01-20 ENCOUNTER — Other Ambulatory Visit: Payer: Self-pay

## 2019-01-20 ENCOUNTER — Ambulatory Visit (HOSPITAL_COMMUNITY)
Admission: RE | Admit: 2019-01-20 | Discharge: 2019-01-20 | Disposition: A | Discharge status: Home / Self Care | Payer: PPO | Source: Ambulatory Visit | Attending: Family | Admitting: Family

## 2019-01-20 ENCOUNTER — Encounter: Payer: Self-pay | Admitting: Family

## 2019-01-20 ENCOUNTER — Ambulatory Visit (INDEPENDENT_AMBULATORY_CARE_PROVIDER_SITE_OTHER): Payer: No Typology Code available for payment source | Admitting: Family

## 2019-01-20 VITALS — BP 109/70 | HR 65 | Temp 97.9°F | Resp 14 | Ht 71.0 in | Wt 200.7 lb

## 2019-01-20 DIAGNOSIS — Z87891 Personal history of nicotine dependence: Secondary | ICD-10-CM | POA: Diagnosis not present

## 2019-01-20 DIAGNOSIS — I6523 Occlusion and stenosis of bilateral carotid arteries: Secondary | ICD-10-CM | POA: Insufficient documentation

## 2019-01-20 NOTE — Progress Notes (Signed)
Chief Complaint: Follow up Extracranial Carotid Artery Stenosis   History of Present Illness  RAMIZ TURPIN is a 85 y.o. male who whomDr. Rubye Beach been monitoring andis back today for follow-up of his extracranial carotid artery occlusive disease.   The patient denies any history of TIA or stroke symptoms, specifically he denies a history of amaurosis fugax or monocular blindness, unilateral facial drooping, hemiplegia,orreceptive or expressive aphasia.   He denies chest pain or dyspnea, denies fever or chills. He is having constipation and problems with his known diverticulosis.   Hedenies claudication type symptoms with walking. He stays physically active, tends his large garden and yard.  Diabetic: no Tobacco use: former smoker, quit in the 1960's, smoked for about 20 years, wife smokes outside the house  Pt meds include: Statin : no, he had arthralgias to a statin, he takes Repatha ASA: yes Other anticoagulants/antiplatelets: no  Past Medical History:  Diagnosis Date  . Allergy   . BPH (benign prostatic hypertrophy)   . Carotid artery occlusion   . Diverticulosis of colon   . ED (erectile dysfunction)   . GERD (gastroesophageal reflux disease)   . Glucose intolerance (impaired glucose tolerance)   . History of pancreatitis   . Hx-TIA (transient ischemic attack)   . Hyperlipidemia   . Hypertension   . Hypertention, malignant, with acute intensive management   . Low back pain   . Osteoarthritis   . PVD (peripheral vascular disease) ([place])    Bilateral carotid  . Tubular adenoma of colon 08/1991   One with carcinoma IN [place] 1993, multiple adenomatous    Social History Social History   Tobacco Use  . Smoking status: Former Smoker    Years: 20.00    Types: Cigarettes  . Smokeless tobacco: Never Used  . Tobacco comment: Daily caffeine use, regular exercise  Substance Use Topics  . Alcohol use: Yes    Alcohol/week: 7.0 - 14.0 standard drinks   Types: 7 - 14 Standard drinks or equivalent per week    Comment: occ beer  . Drug use: No    Family History Family History  Problem Relation Age of Onset  . Dementia Father   . Diabetes Mother   . Peripheral vascular disease Brother   . Colon cancer Sister        dx in her 36's    Surgical History Past Surgical History:  Procedure Laterality Date  . APPENDECTOMY    . BASAL CELL CARCINOMA EXCISION    . CHOLECYSTECTOMY    . COLONOSCOPY    . ESOPHAGOGASTRODUODENOSCOPY    . LEFT HEART CATH AND CORONARY ANGIOGRAPHY N/A 01/26/2017   Procedure: LEFT HEART CATH AND CORONARY ANGIOGRAPHY;  Surgeon: Lorretta Harp, MD;  Location: [facility];  Service: Cardiovascular;  Laterality: N/A;  . LUMBAR SPINE SURGERY     X4    Allergies  Allergen Reactions  . Amlodipine Other (See Comments)    Weakness in legs  . Livalo [Pitavastatin]     Pain, SOB  . Losartan Other (See Comments)    Leg numbness/stiffness.   . Oxycodone Other (See Comments)    crazy  . Pravastatin Sodium Other (See Comments)    REACTION: bad dreams  . Zetia [Ezetimibe]     LEG PAIN   . Rosuvastatin Palpitations  . Statins Palpitations and Other (See Comments)    REACTION: aches, bad dreams    Current Outpatient Medications  Medication Sig Dispense Refill  . aspirin EC 81 MG  tablet Take 81 mg by mouth daily.    . cholecalciferol (VITAMIN D) 1000 units tablet Take 1,000 Units by mouth daily.    . cyanocobalamin 500 MCG tablet Take 500 mcg by mouth daily. Vitamin B12    . doxycycline (VIBRA-TABS) 100 MG tablet Take 1 tablet (100 mg total) by mouth 2 (two) times daily. 14 tablet 0  . Evolocumab with Infusor ([place]) 420 MG/3.5ML SOCT Inject 420 mg into the skin every 30 (thirty) days. 1 Cartridge 12  . isosorbide mononitrate (IMDUR) 60 MG 24 hr tablet Take 1 tablet (60 mg total) by mouth daily. 90 tablet 3  . linaclotide (LINZESS) 290 MCG CAPS capsule Take 1 capsule (290 mcg total) by  mouth daily as needed. 30 capsule 11  . losartan (COZAAR) 25 MG tablet Take 1 tablet (25 mg total) by mouth daily. 90 tablet 3  . metoprolol succinate (TOPROL XL) 25 MG 24 hr tablet Take 1 tablet (25 mg total) by mouth daily. 90 tablet 3  . Multiple Vitamins-Minerals (PRESERVISION AREDS 2) CAPS Take 1 capsule by mouth daily with supper.    . mupirocin ointment (BACTROBAN) 2 % On leg wound w/dressing change qd or bid 30 g 0  . nitroGLYCERIN (NITROSTAT) 0.4 MG SL tablet Place 1 tablet (0.4 mg total) under the tongue every 5 (five) minutes as needed for chest pain. 20 tablet 1  . Polyethyl Glycol-Propyl Glycol (SYSTANE OP) Place 1 drop into both eyes 4 (four) times daily as needed (dry eyes).    . traMADol (ULTRAM) 50 MG tablet TAKE 1 TABLET BY MOUTH EVERY 8 HOURS AS NEEDED 90 tablet 3  . traZODone (DESYREL) 150 MG tablet Take 150 mg by mouth at bedtime.     No current facility-administered medications for this visit.     Review of Systems : See HPI for pertinent positives and negatives.  Physical Examination  Vitals:   01/20/19 0936  BP: 109/70  Pulse: 65  Resp: 14  Temp: 97.9 F (36.6 C)  TempSrc: Temporal  SpO2: 97%  Weight: 200 lb 11.2 oz (91 kg)  Height: 5\' 11"  (1.803 m)   Body mass index is 27.99 kg/m.  General: WDWN male in NAD, accompanied by his daughter GAIT: normal Eyes: PERRLA HENT: No gross abnormalities.  Pulmonary:  Respirations are non-labored, good air movement in all fields, CTAB, no rales, rhonchi, or wheezing. Cardiac: regular rhythm, no detected murmur.  VASCULAR EXAM Carotid Bruits Right Left   Negative Negative     Abdominal aortic pulse is not palpable. Radial pulses are 2+ palpable and equal.                                                                                                                            LE Pulses Right Left       POPLITEAL  not palpable   not palpable       POSTERIOR TIBIAL  2+ palpable   2+ palpable  DORSALIS  PEDIS      ANTERIOR TIBIAL not palpable  not palpable     Gastrointestinal: soft, nontender, BS WNL, no r/g, not palpable masses. Musculoskeletal: no muscle atrophy/wasting. M/S 5/5 throughout, extremities without ischemic changes Skin: No rashes, no ulcers, no cellulitis.   Neurologic:  A&O X 3; appropriate affect, sensation is normal; speech is normal, CN 2-12 intact except has some hearing loss, pain and light touch intact in extremities, motor exam as listed above. Psychiatric: Normal thought content, mood appropriate to clinical situation.     DATA CrotidDuplex(01-20-19): Right ICA: 60 - 79 % stenosis Left ICA: 1-39% stenosis. Left vertebral artery is antegrade, right vertebral artery demonstrates high resistant flow.  Bilateral subclavian artery waveforms are normal. No significant change compared to the examsof 09-05-15,03-10-16, 11-10-16, 07-03-17, and 01-04-18   Assessment: COLEN ELTZROTH is a 85 y.o. male who has no history of stroke or TIA. Fortunately he does not have DM.  Carotid duplex today remains stable since 2017: 60-79% stenosis in the right ICA, 1-39% in the left ICA.  His atherosclerotic risk factors include remote history of smoking. He is statin intolerant, take Repatha, he takes a daily 81 mg ASA.    Plan: Follow-up in 6 months with Carotid Duplex sca.   I discussed in depth with the patient the nature of atherosclerosis, and emphasized the importance of maximal medical management including strict control of blood pressure, blood glucose, and lipid levels, obtaining regular exercise, and continued cessation of smoking.  The patient is aware that without maximal medical management the underlying atherosclerotic disease process will progress, limiting the benefit of any interventions. The patient was given information about stroke prevention and what symptoms should prompt the patient to seek immediate medical care. Thank you for allowing [place] to  participate in this patient's care.  Clemon Chambers, RN, MSN, FNP-C [facility] Office: [facility] Physician: Oneida Alar  01/20/19 10:11 AM

## 2019-01-20 NOTE — Patient Instructions (Signed)

## 2019-01-24 NOTE — Progress Notes (Deleted)
[facility] [address] [place], [place] [address] Phone: [phone] Subjective:    I'm seeing this patient by the request  of:    CC: Left shoulder pain follow-up  [id number]  Richard Burgess is a 85 y.o. male coming in with complaint of ***  Onset-  Location Duration-  Character- Aggravating factors- Reliving factors-  Therapies tried-  Severity-     Past Medical History:  Diagnosis Date  . Allergy   . BPH (benign prostatic hypertrophy)   . Carotid artery occlusion   . Diverticulosis of colon   . ED (erectile dysfunction)   . GERD (gastroesophageal reflux disease)   . Glucose intolerance (impaired glucose tolerance)   . History of pancreatitis   . Hx-TIA (transient ischemic attack)   . Hyperlipidemia   . Hypertension   . Hypertention, malignant, with acute intensive management   . Low back pain   . Osteoarthritis   . PVD (peripheral vascular disease) ([place])    Bilateral carotid  . Tubular adenoma of colon 08/1991   One with carcinoma IN [place] 1993, multiple adenomatous   Past Surgical History:  Procedure Laterality Date  . APPENDECTOMY    . BASAL CELL CARCINOMA EXCISION    . CHOLECYSTECTOMY    . COLONOSCOPY    . ESOPHAGOGASTRODUODENOSCOPY    . LEFT HEART CATH AND CORONARY ANGIOGRAPHY N/A 01/26/2017   Procedure: LEFT HEART CATH AND CORONARY ANGIOGRAPHY;  Surgeon: Lorretta Harp, MD;  Location: [facility];  Service: Cardiovascular;  Laterality: N/A;  . LUMBAR SPINE SURGERY     X4   Social History   Socioeconomic History  . Marital status: Married    Spouse name: Not on file  . Number of children: 3  . Years of education: Not on file  . Highest education level: Not on file  Occupational History  . Occupation: Retired-Construction  Social Needs  . Financial resource strain: Not on file  . Food insecurity    Worry: Not on file    Inability: Not on file  . Transportation needs    Medical: Not on file   Non-medical: Not on file  Tobacco Use  . Smoking status: Former Smoker    Years: 20.00    Types: Cigarettes  . Smokeless tobacco: Never Used  . Tobacco comment: Daily caffeine use, regular exercise  Substance and Sexual Activity  . Alcohol use: Yes    Alcohol/week: 7.0 - 14.0 standard drinks    Types: 7 - 14 Standard drinks or equivalent per week    Comment: occ beer  . Drug use: No  . Sexual activity: Yes  Lifestyle  . Physical activity    Days per week: Not on file    Minutes per session: Not on file  . Stress: Not on file  Relationships  . Social Herbalist on phone: Not on file    Gets together: Not on file    Attends religious service: Not on file    Active member of club or organization: Not on file    Attends meetings of clubs or organizations: Not on file    Relationship status: Not on file  Other Topics Concern  . Not on file  Social History Narrative  . Not on file   Allergies  Allergen Reactions  . Amlodipine Other (See Comments)    Weakness in legs  . Livalo [Pitavastatin]     Pain, SOB  . Losartan Other (See  Comments)    Leg numbness/stiffness.   . Oxycodone Other (See Comments)    crazy  . Pravastatin Sodium Other (See Comments)    REACTION: bad dreams  . Zetia [Ezetimibe]     LEG PAIN   . Rosuvastatin Palpitations  . Statins Palpitations and Other (See Comments)    REACTION: aches, bad dreams   Family History  Problem Relation Age of Onset  . Dementia Father   . Diabetes Mother   . Peripheral vascular disease Brother   . Colon cancer Sister        dx in her 42's     Current Outpatient Medications (Cardiovascular):  Marland Kitchen  Evolocumab with Infusor ([place]) 420 MG/3.5ML SOCT, Inject 420 mg into the skin every 30 (thirty) days. .  isosorbide mononitrate (IMDUR) 60 MG 24 hr tablet, Take 1 tablet (60 mg total) by mouth daily. Marland Kitchen  losartan (COZAAR) 25 MG tablet, Take 1 tablet (25 mg total) by mouth daily. .  metoprolol  succinate (TOPROL XL) 25 MG 24 hr tablet, Take 1 tablet (25 mg total) by mouth daily. .  nitroGLYCERIN (NITROSTAT) 0.4 MG SL tablet, Place 1 tablet (0.4 mg total) under the tongue every 5 (five) minutes as needed for chest pain.   Current Outpatient Medications (Analgesics):  .  aspirin EC 81 MG tablet, Take 81 mg by mouth daily. .  traMADol (ULTRAM) 50 MG tablet, TAKE 1 TABLET BY MOUTH EVERY 8 HOURS AS NEEDED  Current Outpatient Medications (Hematological):  .  cyanocobalamin 500 MCG tablet, Take 500 mcg by mouth daily. Vitamin B12  Current Outpatient Medications (Other):  .  cholecalciferol (VITAMIN D) 1000 units tablet, Take 1,000 Units by mouth daily. Marland Kitchen  doxycycline (VIBRA-TABS) 100 MG tablet, Take 1 tablet (100 mg total) by mouth 2 (two) times daily. Marland Kitchen  linaclotide (LINZESS) 290 MCG CAPS capsule, Take 1 capsule (290 mcg total) by mouth daily as needed. .  Multiple Vitamins-Minerals (PRESERVISION AREDS 2) CAPS, Take 1 capsule by mouth daily with supper. .  mupirocin ointment (BACTROBAN) 2 %, On leg wound w/dressing change qd or bid .  Polyethyl Glycol-Propyl Glycol (SYSTANE OP), Place 1 drop into both eyes 4 (four) times daily as needed (dry eyes). .  traZODone (DESYREL) 150 MG tablet, Take 150 mg by mouth at bedtime.    Past medical history, social, surgical and family history all reviewed in electronic medical record.  No pertanent information unless stated regarding to the chief complaint.   Review of Systems:  No headache, visual changes, nausea, vomiting, diarrhea, constipation, dizziness, abdominal pain, skin rash, fevers, chills, night sweats, weight loss, swollen lymph nodes, body aches, joint swelling, muscle aches, chest pain, shortness of breath, mood changes.   Objective  There were no vitals taken for this visit. Systems examined below as of    General: No apparent distress alert and oriented x3 mood and affect normal, dressed appropriately.  HEENT: Pupils equal,  extraocular movements intact  Respiratory: Patient's speak in full sentences and does not appear short of breath  Cardiovascular: No lower extremity edema, non tender, no erythema  Skin: Warm dry intact with no signs of infection or rash on extremities or on axial skeleton.  Abdomen: Soft nontender  Neuro: Cranial nerves II through XII are intact, neurovascularly intact in all extremities with 2+ DTRs and 2+ pulses.  Lymph: No lymphadenopathy of posterior or anterior cervical chain or axillae bilaterally.  Gait normal with good balance and coordination.  MSK:  Non tender with  full range of motion and good stability and symmetric strength and tone of shoulders, elbows, wrist, hip, knee and ankles bilaterally.     Impression and Recommendations:     This case required medical decision making of moderate complexity. The above documentation has been reviewed and is accurate and complete Lyndal Pulley, DO       Note: This dictation was prepared with Dragon dictation along with smaller phrase technology. Any transcriptional errors that result from this process are unintentional.

## 2019-01-25 ENCOUNTER — Ambulatory Visit: Payer: PPO | Admitting: Family Medicine

## 2019-01-25 DIAGNOSIS — Z0289 Encounter for other administrative examinations: Secondary | ICD-10-CM

## 2019-03-01 ENCOUNTER — Ambulatory Visit: Payer: PPO | Admitting: Gastroenterology

## 2019-04-11 ENCOUNTER — Other Ambulatory Visit: Payer: Self-pay

## 2019-04-11 DIAGNOSIS — E785 Hyperlipidemia, unspecified: Secondary | ICD-10-CM

## 2019-04-20 ENCOUNTER — Encounter (INDEPENDENT_AMBULATORY_CARE_PROVIDER_SITE_OTHER): Payer: Self-pay | Admitting: Otolaryngology

## 2019-04-20 ENCOUNTER — Other Ambulatory Visit: Payer: Self-pay

## 2019-04-20 ENCOUNTER — Ambulatory Visit (INDEPENDENT_AMBULATORY_CARE_PROVIDER_SITE_OTHER): Payer: PPO | Admitting: Otolaryngology

## 2019-04-20 VITALS — Temp 97.9°F

## 2019-04-20 DIAGNOSIS — C44202 Unspecified malignant neoplasm of skin of right ear and external auricular canal: Secondary | ICD-10-CM | POA: Diagnosis not present

## 2019-04-20 NOTE — Progress Notes (Signed)
HPI: Richard Burgess is a 85 y.o. male who returns today for evaluation of right ear lesion.  He apparently had a biopsy performed at the [place] system that demonstrated this to be a basal cell carcinoma.  However he did not want to have any surgery performed at the [facility] system and presents here to discuss possible excision of the right ear concha basal cell carcinoma..  Past Medical History:  Diagnosis Date  . Allergy   . BPH (benign prostatic hypertrophy)   . Carotid artery occlusion   . Diverticulosis of colon   . ED (erectile dysfunction)   . GERD (gastroesophageal reflux disease)   . Glucose intolerance (impaired glucose tolerance)   . History of pancreatitis   . Hx-TIA (transient ischemic attack)   . Hyperlipidemia   . Hypertension   . Hypertention, malignant, with acute intensive management   . Low back pain   . Osteoarthritis   . PVD (peripheral vascular disease) ([place])    Bilateral carotid  . Tubular adenoma of colon 08/1991   One with carcinoma IN [facility] 1993, multiple adenomatous   Past Surgical History:  Procedure Laterality Date  . APPENDECTOMY    . BASAL CELL CARCINOMA EXCISION    . CHOLECYSTECTOMY    . COLONOSCOPY    . ESOPHAGOGASTRODUODENOSCOPY    . LEFT HEART CATH AND CORONARY ANGIOGRAPHY N/A 01/26/2017   Procedure: LEFT HEART CATH AND CORONARY ANGIOGRAPHY;  Surgeon: Lorretta Harp, MD;  Location: [place] [facility];  Service: Cardiovascular;  Laterality: N/A;  . LUMBAR SPINE SURGERY     X4   Social History   Socioeconomic History  . Marital status: Married    Spouse name: Not on file  . Number of children: 3  . Years of education: Not on file  . Highest education level: Not on file  Occupational History  . Occupation: Retired-Construction  Social Needs  . Financial resource strain: Not on file  . Food insecurity    Worry: Not on file    Inability: Not on file  . Transportation needs    Medical: Not on file    Non-medical: Not on file  Tobacco Use  .  Smoking status: Former Smoker    Packs/day: 0.50    Years: 20.00    Pack years: 10.00    Types: Cigarettes    Start date: 11    Quit date: 1970    Years since quitting: 50.8  . Smokeless tobacco: Never Used  . Tobacco comment: Daily caffeine use, regular exercise  Substance and Sexual Activity  . Alcohol use: Yes    Alcohol/week: 7.0 - 14.0 standard drinks    Types: 7 - 14 Standard drinks or equivalent per week    Comment: occ beer  . Drug use: No  . Sexual activity: Yes  Lifestyle  . Physical activity    Days per week: Not on file    Minutes per session: Not on file  . Stress: Not on file  Relationships  . Social Herbalist on phone: Not on file    Gets together: Not on file    Attends religious service: Not on file    Active member of club or organization: Not on file    Attends meetings of clubs or organizations: Not on file    Relationship status: Not on file  Other Topics Concern  . Not on file  Social History Narrative  . Not on file   Family History  Problem  Relation Age of Onset  . Dementia Father   . Diabetes Mother   . Peripheral vascular disease Brother   . Colon cancer Sister        dx in her 30's   Allergies  Allergen Reactions  . Amlodipine Other (See Comments)    Weakness in legs  . Livalo [Pitavastatin]     Pain, SOB  . Losartan Other (See Comments)    Leg numbness/stiffness.   . Oxycodone Other (See Comments)    crazy  . Pravastatin Sodium Other (See Comments)    REACTION: bad dreams  . Zetia [Ezetimibe]     LEG PAIN   . Rosuvastatin Palpitations  . Statins Palpitations and Other (See Comments)    REACTION: aches, bad dreams   Prior to Admission medications   Medication Sig Start Date End Date Taking? Authorizing Provider  aspirin EC 81 MG tablet Take 81 mg by mouth daily.   Yes [provider]  cholecalciferol (VITAMIN D) 1000 units tablet Take 1,000 Units by mouth daily.   Yes [provider]   cyanocobalamin 500 MCG tablet Take 500 mcg by mouth daily. Vitamin B12   Yes [provider]  doxycycline (VIBRA-TABS) 100 MG tablet Take 1 tablet (100 mg total) by mouth 2 (two) times daily. 11/02/18  Yes Plotnikov, Evie Lacks, MD  Evolocumab with Infusor ([place]) 420 MG/3.5ML SOCT Inject 420 mg into the skin every 30 (thirty) days. 12/31/17  Yes [name], MD  isosorbide mononitrate (IMDUR) 60 MG 24 hr tablet Take 1 tablet (60 mg total) by mouth daily. 04/30/18  Yes [name], MD  linaclotide [facility]) 290 MCG CAPS capsule Take 1 capsule (290 mcg total) by mouth daily as needed. 01/27/18  Yes Plotnikov, Evie Lacks, MD  metoprolol succinate (TOPROL XL) 25 MG 24 hr tablet Take 1 tablet (25 mg total) by mouth daily. 12/04/17  Yes [name], MD  Multiple Vitamins-Minerals (PRESERVISION AREDS 2) CAPS Take 1 capsule by mouth daily with supper.   Yes [provider]  mupirocin ointment (BACTROBAN) 2 % On leg wound w/dressing change qd or bid 11/02/18  Yes Plotnikov, Evie Lacks, MD  nitroGLYCERIN (NITROSTAT) 0.4 MG SL tablet Place 1 tablet (0.4 mg total) under the tongue every 5 (five) minutes as needed for chest pain. 12/11/16  Yes Plotnikov, Evie Lacks, MD  Polyethyl Glycol-Propyl Glycol (SYSTANE OP) Place 1 drop into both eyes 4 (four) times daily as needed (dry eyes).   Yes [provider]  traMADol (ULTRAM) 50 MG tablet TAKE 1 TABLET BY MOUTH EVERY 8 HOURS AS NEEDED 06/08/18  Yes Plotnikov, Evie Lacks, MD  traZODone (DESYREL) 150 MG tablet Take 150 mg by mouth at bedtime.   Yes [provider]  losartan (COZAAR) 25 MG tablet Take 1 tablet (25 mg total) by mouth daily. 10/22/18 01/20/19  Lelon Perla, MD     Positive ROS: Otherwise negative  All other systems have been reviewed and were otherwise negative with the exception of those mentioned in the HPI and as above.  Physical Exam: General: Alert, no acute distress Ears:  Patient has an approximate 2 cm erythematous and partially ulcerative lesion of the right concha.  Since the biopsy apparently it has been doing better however I suspect they just removed a portion of this with the biopsy.  On microscopic exam he still has moderate size gross disease present.  Remaining ear canal is clear.  Left ear and left ear canal are normal. Nasal:  Clear nasal passages.  Mild rhinitis Oral: Clear oropharynx Neck: No palpable adenopathy or masses  Procedures  Assessment: Basal cell carcinoma of the right ear approximately 1.5 x 2 cm  Plan: Discussed options of excision with skin graft versus application of Aldara cream twice daily for 6 to 8 weeks. He would like to try the cream. He will follow-up in 8 weeks for recheck.   Radene Journey, MD

## 2019-04-21 ENCOUNTER — Telehealth (INDEPENDENT_AMBULATORY_CARE_PROVIDER_SITE_OTHER): Payer: Self-pay

## 2019-04-22 ENCOUNTER — Telehealth: Payer: Self-pay | Admitting: Cardiovascular Disease

## 2019-04-22 DIAGNOSIS — H04123 Dry eye syndrome of bilateral lacrimal glands: Secondary | ICD-10-CM | POA: Insufficient documentation

## 2019-04-22 DIAGNOSIS — H52212 Irregular astigmatism, left eye: Secondary | ICD-10-CM | POA: Insufficient documentation

## 2019-04-22 DIAGNOSIS — H179 Unspecified corneal scar and opacity: Secondary | ICD-10-CM | POA: Insufficient documentation

## 2019-04-22 DIAGNOSIS — H0288A Meibomian gland dysfunction right eye, upper and lower eyelids: Secondary | ICD-10-CM | POA: Insufficient documentation

## 2019-04-22 MED ORDER — ISOSORBIDE MONONITRATE ER 60 MG PO TB24: 60.0000 mg | ORAL_TABLET | Freq: Every day | ORAL | 3 refills | Status: DC

## 2019-04-22 MED ORDER — REPATHA SURECLICK 140 MG/ML ~~LOC~~ SOAJ: 140.0000 mg | SUBCUTANEOUS | 4 refills | Status: DC

## 2019-04-22 NOTE — Telephone Encounter (Signed)
New Message  Richard Burgess is calling for verbal orders of repatha

## 2019-04-22 NOTE — Telephone Encounter (Signed)
Rx for Repatha SureClick 140mg  x 1 year given to [facility] today 111/3/202 (verbal )

## 2019-04-25 ENCOUNTER — Other Ambulatory Visit: Payer: Self-pay

## 2019-04-25 DIAGNOSIS — E785 Hyperlipidemia, unspecified: Secondary | ICD-10-CM | POA: Diagnosis not present

## 2019-04-25 LAB — LIPID PANEL
Chol/HDL Ratio: 2.6 ratio (ref 0.0–5.0)
Cholesterol, Total: 110 mg/dL (ref 100–199)
HDL: 42 mg/dL (ref >39)
LDL Chol Calc (NIH): 42 mg/dL (ref 0–99)
Triglycerides: 151 mg/dL — ABNORMAL HIGH (ref 0–149)
VLDL Cholesterol Cal: 26 mg/dL (ref 5–40)

## 2019-04-25 MED ORDER — REPATHA SURECLICK 140 MG/ML ~~LOC~~ SOAJ: 140.0000 mg | SUBCUTANEOUS | 6 refills | Status: DC

## 2019-05-04 ENCOUNTER — Other Ambulatory Visit: Payer: Self-pay

## 2019-05-13 ENCOUNTER — Telehealth: Payer: Self-pay | Admitting: Pharmacist

## 2019-05-13 NOTE — Telephone Encounter (Signed)
[name] (patient's daughter) [facility] requesting information about Mr Pelagio Repatha approval.  [employer] safetynet is approved until Dec/31/2020. Renewal pending.

## 2019-05-19 ENCOUNTER — Ambulatory Visit (INDEPENDENT_AMBULATORY_CARE_PROVIDER_SITE_OTHER): Payer: PPO | Admitting: Otolaryngology

## 2019-05-19 ENCOUNTER — Other Ambulatory Visit: Payer: Self-pay

## 2019-05-19 VITALS — Temp 98.1°F

## 2019-05-19 DIAGNOSIS — C44212 Basal cell carcinoma of skin of right ear and external auricular canal: Secondary | ICD-10-CM | POA: Diagnosis not present

## 2019-05-19 MED ORDER — IMIQUIMOD 5 % EX CREA: TOPICAL_CREAM | Freq: Every day | CUTANEOUS | 0 refills | Status: DC

## 2019-05-19 NOTE — Progress Notes (Signed)
HPI: Richard Burgess is a 85 y.o. male who returns today for evaluation of right ear basal cell carcinoma.  He has been applying the Aldara cream twice daily for the past 4 weeks.  Is very inflamed and sore but he want to get it checked.Marland Kitchen  He was concerned that he got some of the cream down in the ear canal.  Past Medical History:  Diagnosis Date  . Allergy   . BPH (benign prostatic hypertrophy)   . Carotid artery occlusion   . Diverticulosis of colon   . ED (erectile dysfunction)   . GERD (gastroesophageal reflux disease)   . Glucose intolerance (impaired glucose tolerance)   . History of pancreatitis   . Hx-TIA (transient ischemic attack)   . Hyperlipidemia   . Hypertension   . Hypertention, malignant, with acute intensive management   . Low back pain   . Osteoarthritis   . PVD (peripheral vascular disease) ([place])    Bilateral carotid  . Tubular adenoma of colon 08/1991   One with carcinoma IN [place] 1993, multiple adenomatous   Past Surgical History:  Procedure Laterality Date  . APPENDECTOMY    . BASAL CELL CARCINOMA EXCISION    . CHOLECYSTECTOMY    . COLONOSCOPY    . ESOPHAGOGASTRODUODENOSCOPY    . LEFT HEART CATH AND CORONARY ANGIOGRAPHY N/A 01/26/2017   Procedure: LEFT HEART CATH AND CORONARY ANGIOGRAPHY;  Surgeon: Lorretta Harp, MD;  Location: [place] [facility];  Service: Cardiovascular;  Laterality: N/A;  . LUMBAR SPINE SURGERY     X4   Social History   Socioeconomic History  . Marital status: Married    Spouse name: Not on file  . Number of children: 3  . Years of education: Not on file  . Highest education level: Not on file  Occupational History  . Occupation: Retired-Construction  Tobacco Use  . Smoking status: Former Smoker    Packs/day: 0.50    Years: 20.00    Pack years: 10.00    Types: Cigarettes    Start date: 30    Quit date: 1970    Years since quitting: 50.9  . Smokeless tobacco: Never Used  . Tobacco comment: Daily caffeine use, regular  exercise  Substance and Sexual Activity  . Alcohol use: Yes    Alcohol/week: 7.0 - 14.0 standard drinks    Types: 7 - 14 Standard drinks or equivalent per week    Comment: occ beer  . Drug use: No  . Sexual activity: Yes  Other Topics Concern  . Not on file  Social History Narrative  . Not on file   Social Determinants of Health   Financial Resource Strain:   . Difficulty of Paying Living Expenses: Not on file  Food Insecurity:   . Worried About Charity fundraiser in the Last Year: Not on file  . Ran Out of Food in the Last Year: Not on file  Transportation Needs:   . Lack of Transportation (Medical): Not on file  . Lack of Transportation (Non-Medical): Not on file  Physical Activity:   . Days of Exercise per Week: Not on file  . Minutes of Exercise per Session: Not on file  Stress:   . Feeling of Stress : Not on file  Social Connections:   . Frequency of Communication with Friends and Family: Not on file  . Frequency of Social Gatherings with Friends and Family: Not on file  . Attends Religious Services: Not on file  .  Active Member of Clubs or Organizations: Not on file  . Attends Archivist Meetings: Not on file  . Marital Status: Not on file   Family History  Problem Relation Age of Onset  . Dementia Father   . Diabetes Mother   . Peripheral vascular disease Brother   . Colon cancer Sister        dx in her 61's   Allergies  Allergen Reactions  . Amlodipine Other (See Comments)    Weakness in legs  . Livalo [Pitavastatin]     Pain, SOB  . Losartan Other (See Comments)    Leg numbness/stiffness.   . Oxycodone Other (See Comments)    crazy  . Pravastatin Sodium Other (See Comments)    REACTION: bad dreams  . Zetia [Ezetimibe]     LEG PAIN   . Rosuvastatin Palpitations  . Statins Palpitations and Other (See Comments)    REACTION: aches, bad dreams   Prior to Admission medications   Medication Sig Start Date End Date Taking? Authorizing  Provider  aspirin EC 81 MG tablet Take 81 mg by mouth daily.   Yes [provider]  cholecalciferol (VITAMIN D) 1000 units tablet Take 1,000 Units by mouth daily.   Yes [provider]  cyanocobalamin 500 MCG tablet Take 500 mcg by mouth daily. Vitamin B12   Yes [provider]  doxycycline (VIBRA-TABS) 100 MG tablet Take 1 tablet (100 mg total) by mouth 2 (two) times daily. 11/02/18  Yes Plotnikov, Evie Lacks, MD  Evolocumab (REPATHA SURECLICK) [id number] MG/ML SOAJ Inject 140 mg into the skin every 14 (fourteen) days. 04/25/19  Yes [name], MD  isosorbide mononitrate (IMDUR) 60 MG 24 hr tablet Take 1 tablet (60 mg total) by mouth daily. 04/22/19  Yes [name], MD  linaclotide [facility]) 290 MCG CAPS capsule Take 1 capsule (290 mcg total) by mouth daily as needed. 01/27/18  Yes Plotnikov, Evie Lacks, MD  metoprolol succinate (TOPROL XL) 25 MG 24 hr tablet Take 1 tablet (25 mg total) by mouth daily. 12/04/17  Yes [name], MD  Multiple Vitamins-Minerals (PRESERVISION AREDS 2) CAPS Take 1 capsule by mouth daily with supper.   Yes [provider]  mupirocin ointment (BACTROBAN) 2 % On leg wound w/dressing change qd or bid 11/02/18  Yes Plotnikov, Evie Lacks, MD  nitroGLYCERIN (NITROSTAT) 0.4 MG SL tablet Place 1 tablet (0.4 mg total) under the tongue every 5 (five) minutes as needed for chest pain. 12/11/16  Yes Plotnikov, Evie Lacks, MD  Polyethyl Glycol-Propyl Glycol (SYSTANE OP) Place 1 drop into both eyes 4 (four) times daily as needed (dry eyes).   Yes [provider]  traMADol (ULTRAM) 50 MG tablet TAKE 1 TABLET BY MOUTH EVERY 8 HOURS AS NEEDED 06/08/18  Yes Plotnikov, Evie Lacks, MD  traZODone (DESYREL) 150 MG tablet Take 150 mg by mouth at bedtime.   Yes [provider]  losartan (COZAAR) 25 MG tablet Take 1 tablet (25 mg total) by mouth daily. 10/22/18 01/20/19  Lelon Perla, MD     Positive ROS: Otherwise negative  All  other systems have been reviewed and were otherwise negative with the exception of those mentioned in the HPI and as above.  Physical Exam: Constitutional: Alert, well-appearing, no acute distress Ears: The right external ear is very erythematous within the concha area where the cream was applied on.  The ear canal was cleaned but there was minimal cream within the ear canal.  The TM was  clear.  Of note on examination of the right ear he had what appeared to be a small basal cell carcinoma just in front of the tragus on the skin of the face just in front of the right ear. Nasal: External nose without lesions. Septum relatively midline. Clear nasal passages Oral: Lips and gums without lesions. Tongue and palate mucosa without lesions. Posterior oropharynx clear. Neck: No palpable adenopathy or masses Respiratory: Breathing comfortably  Skin: No facial/neck lesions or rash noted.  Procedures  Assessment: Right ear basal cell carcinoma. New facial basal cell carcinoma identified today.  Plan: Recommended application of Aldara cream for another 2 weeks but reduce to once a day.  However applied to the new facial lesion for 4 weeks. He will follow-up in 5 weeks for recheck.   Radene Journey, MD

## 2019-05-23 ENCOUNTER — Telehealth: Payer: Self-pay

## 2019-05-23 NOTE — Telephone Encounter (Signed)
LMOMED the pt stating that the repatha was denied from [facility] but instructed them to apply by phone to [facility] and to call us back if they have any questions

## 2019-05-25 ENCOUNTER — Ambulatory Visit (INDEPENDENT_AMBULATORY_CARE_PROVIDER_SITE_OTHER): Payer: PPO | Admitting: Internal Medicine

## 2019-05-25 ENCOUNTER — Encounter: Payer: Self-pay | Admitting: Internal Medicine

## 2019-05-25 ENCOUNTER — Other Ambulatory Visit: Payer: Self-pay

## 2019-05-25 DIAGNOSIS — G459 Transient cerebral ischemic attack, unspecified: Secondary | ICD-10-CM

## 2019-05-25 DIAGNOSIS — C44212 Basal cell carcinoma of skin of right ear and external auricular canal: Secondary | ICD-10-CM | POA: Diagnosis not present

## 2019-05-25 DIAGNOSIS — H6011 Cellulitis of right external ear: Secondary | ICD-10-CM | POA: Diagnosis not present

## 2019-05-25 MED ORDER — NEOMYCIN-POLYMYXIN-HC 3.5-10000-1 OT SOLN: 3.0000 [drp] | Freq: Three times a day (TID) | OTIC | 3 refills | Status: DC

## 2019-05-25 MED ORDER — DOXYCYCLINE HYCLATE 100 MG PO TABS: 100.0000 mg | ORAL_TABLET | Freq: Two times a day (BID) | ORAL | 0 refills | Status: DC

## 2019-05-25 MED ORDER — TRIAMCINOLONE ACETONIDE 0.5 % EX OINT: 1.0000 "application " | TOPICAL_OINTMENT | Freq: Two times a day (BID) | CUTANEOUS | 0 refills | Status: AC

## 2019-05-25 NOTE — Assessment & Plan Note (Signed)
Probable Doxy po

## 2019-05-25 NOTE — Progress Notes (Signed)
Subjective:  Patient ID: Richard Burgess, male    DOB: Richard 03, 1930  Age: 85 y.o. MRN: [id number]  CC: No chief complaint on file.   HPI Richard Burgess presents for  R ear cancer -  Much worse burning pain with IMIQUIMOD 5% cream (Dr Richard Burgess, ENT). The pt wants a 2nd opinion - - wants to go to [place]  Outpatient Medications Prior to Visit  Medication Sig Dispense Refill  . aspirin EC 81 MG tablet Take 81 mg by mouth daily.    . cholecalciferol (VITAMIN D) 1000 units tablet Take 1,000 Units by mouth daily.    . cyanocobalamin 500 MCG tablet Take 500 mcg by mouth daily. Vitamin B12    . Evolocumab (REPATHA SURECLICK) [id number] MG/ML SOAJ Inject 140 mg into the skin every 14 (fourteen) days. 2 pen 6  . imiquimod (ALDARA) 5 % cream Apply topically daily. Apply to the right ear daily for the next 2 weeks.  Apply to the facial lesion daily for 4 weeks. 12 each 0  . isosorbide mononitrate (IMDUR) 60 MG 24 hr tablet Take 1 tablet (60 mg total) by mouth daily. 90 tablet 3  . linaclotide (LINZESS) 290 MCG CAPS capsule Take 1 capsule (290 mcg total) by mouth daily as needed. 30 capsule 11  . metoprolol succinate (TOPROL XL) 25 MG 24 hr tablet Take 1 tablet (25 mg total) by mouth daily. 90 tablet 3  . Multiple Vitamins-Minerals (PRESERVISION AREDS 2) CAPS Take 1 capsule by mouth daily with supper.    . mupirocin ointment (BACTROBAN) 2 % On leg wound w/dressing change qd or bid 30 g 0  . nitroGLYCERIN (NITROSTAT) 0.4 MG SL tablet Place 1 tablet (0.4 mg total) under the tongue every 5 (five) minutes as needed for chest pain. 20 tablet 1  . Polyethyl Glycol-Propyl Glycol (SYSTANE OP) Place 1 drop into both eyes 4 (four) times daily as needed (dry eyes).    . traMADol (ULTRAM) 50 MG tablet TAKE 1 TABLET BY MOUTH EVERY 8 HOURS AS NEEDED 90 tablet 3  . traZODone (DESYREL) 150 MG tablet Take 150 mg by mouth at bedtime.    Marland Kitchen losartan (COZAAR) 25 MG tablet Take 1 tablet (25 mg total) by mouth daily. 90  tablet 3  . doxycycline (VIBRA-TABS) 100 MG tablet Take 1 tablet (100 mg total) by mouth 2 (two) times daily. 14 tablet 0   No facility-administered medications prior to visit.    ROS: Review of Systems  Constitutional: Negative for appetite change, fatigue and unexpected weight change.  HENT: Positive for ear pain. Negative for congestion, nosebleeds, sneezing, sore throat and trouble swallowing.   Eyes: Negative for itching and visual disturbance.  Respiratory: Negative for cough.   Cardiovascular: Negative for chest pain, palpitations and leg swelling.  Gastrointestinal: Negative for abdominal distention, blood in stool, diarrhea and nausea.  Genitourinary: Negative for frequency and hematuria.  Musculoskeletal: Negative for back pain, gait problem, joint swelling and neck pain.  Skin: Positive for color change, rash and wound.  Neurological: Negative for dizziness, tremors, speech difficulty and weakness.  Psychiatric/Behavioral: Negative for agitation, dysphoric mood and sleep disturbance. The patient is not nervous/anxious.     Objective:  BP (!) 146/82 (BP Location: Left Arm, Patient Position: Sitting, Cuff Size: Large)   Pulse 88   Temp 98.4 F (36.9 C) (Oral)   Ht 5\' 11"  (1.803 m)   Wt 204 lb (92.5 kg)   SpO2 96%   BMI  28.45 kg/m   BP Readings from Last 3 Encounters:  05/25/19 (!) 146/82  01/20/19 109/70  12/20/18 122/74    Wt Readings from Last 3 Encounters:  05/25/19 204 lb (92.5 kg)  01/20/19 200 lb 11.2 oz (91 kg)  12/20/18 206 lb (93.4 kg)    Physical Exam Constitutional:      General: He is not in acute distress.    Appearance: He is well-developed.     Comments: NAD  Eyes:     Conjunctiva/sclera: Conjunctivae normal.     Pupils: Pupils are equal, round, and reactive to light.  Neck:     Thyroid: No thyromegaly.     Vascular: No JVD.  Cardiovascular:     Rate and Rhythm: Normal rate and regular rhythm.     Heart sounds: Normal heart sounds. No  murmur. No friction rub. No gallop.   Pulmonary:     Effort: Pulmonary effort is normal. No respiratory distress.     Breath sounds: Normal breath sounds. No wheezing or rales.  Chest:     Chest wall: No tenderness.  Abdominal:     General: Bowel sounds are normal. There is no distension.     Palpations: Abdomen is soft. There is no mass.     Tenderness: There is no abdominal tenderness. There is no guarding or rebound.  Musculoskeletal:        General: No tenderness. Normal range of motion.     Cervical back: Normal range of motion.  Lymphadenopathy:     Cervical: No cervical adenopathy.  Skin:    General: Skin is warm and dry.     Findings: No rash.  Neurological:     Mental Status: He is alert and oriented to person, place, and time.     Cranial Nerves: No cranial nerve deficit.     Motor: No abnormal muscle tone.     Coordination: Coordination normal.     Gait: Gait normal.     Deep Tendon Reflexes: Reflexes are normal and symmetric.  Psychiatric:        Behavior: Behavior normal.        Thought Content: Thought content normal.        Judgment: Judgment normal.      R ear - red, swollen w/scabs   FTF>25 min discussing cancer  Lab Results  Component Value Date   WBC 5.6 04/10/2017   HGB 12.4 (L) 04/10/2017   HCT 37.1 (L) 04/10/2017   PLT 180 04/10/2017   GLUCOSE 159 (H) 05/31/2018   CHOL 110 04/25/2019   TRIG 151 (H) 04/25/2019   HDL 42 04/25/2019   LDLDIRECT 153.0 02/18/2016   LDLCALC 42 04/25/2019   ALT 12 05/31/2018   AST 14 05/31/2018   NA 138 05/31/2018   K 4.6 05/31/2018   CL 104 05/31/2018   CREATININE 1.05 05/31/2018   BUN 17 05/31/2018   CO2 27 05/31/2018   TSH 2.88 02/18/2016   PSA 0.28 03/23/2014   INR 0.97 04/10/2017   HGBA1C 5.9 (H) 01/25/2017   MICROALBUR 0.2 12/13/2007    VAS US CAROTID  Result Date: 01/20/2019 Carotid Arterial Duplex Study Indications:                          Carotid artery disease. Risk Factors:                          Hypertension, hyperlipidemia, past  history of smoking. Pre-Surgical Evaluation & Surgical    Stenosis at bifurcation and ICA. ICA is Correlation:                          normal past the stenosis. Anatomy is                                       within normal limits.Bifurcation is                                       located near the Hyoid Notch. Performing Technologist: Ronal Fear RVS, RCS  Examination Guidelines: A complete evaluation includes B-mode imaging, spectral Doppler, color Doppler, and power Doppler as needed of all accessible portions of each vessel. Bilateral testing is considered an integral part of a complete examination. Limited examinations for reoccurring indications may be performed as noted.  Right Carotid Findings: +----------+--------+--------+--------+-------------------------+--------+           PSV cm/sEDV cm/sStenosisDescribe                 Comments +----------+--------+--------+--------+-------------------------+--------+ CCA Prox  56      7                                                 +----------+--------+--------+--------+-------------------------+--------+ CCA Mid   69      13              heterogenous                      +----------+--------+--------+--------+-------------------------+--------+ CCA Distal67      13              heterogenous and calcific         +----------+--------+--------+--------+-------------------------+--------+ ICA Prox  368     114     60-79%  calcific and heterogenous         +----------+--------+--------+--------+-------------------------+--------+ ICA Mid   136     27                                                +----------+--------+--------+--------+-------------------------+--------+ ICA Distal48      13                                                +----------+--------+--------+--------+-------------------------+--------+ ECA       97                                                         +----------+--------+--------+--------+-------------------------+--------+ +----------+--------+-------+----------------+-------------------+           PSV cm/sEDV cmsDescribe        Arm Pressure (mmHG) +----------+--------+-------+----------------+-------------------+ [id number]  Multiphasic, WNL                    +----------+--------+-------+----------------+-------------------+ +---------+--------+--+--------+----------------------------+ VertebralPSV cm/s58EDV cm/sAntegrade and High resistant +---------+--------+--+--------+----------------------------+  Left Carotid Findings: +----------+--------+--------+--------+------------+--------+           PSV cm/sEDV cm/sStenosisDescribe    Comments +----------+--------+--------+--------+------------+--------+ CCA Prox  78      17                                   +----------+--------+--------+--------+------------+--------+ CCA Mid   69      17                                   +----------+--------+--------+--------+------------+--------+ CCA Distal60      12                                   +----------+--------+--------+--------+------------+--------+ ICA Prox  71      18      1-39%   heterogenous         +----------+--------+--------+--------+------------+--------+ ICA Mid   59      21                                   +----------+--------+--------+--------+------------+--------+ ICA Distal54      18                                   +----------+--------+--------+--------+------------+--------+ ECA       102                                          +----------+--------+--------+--------+------------+--------+ +----------+--------+--------+----------------+-------------------+ SubclavianPSV cm/sEDV cm/sDescribe        Arm Pressure (mmHG) +----------+--------+--------+----------------+-------------------+           111              Multiphasic, WNL                    +----------+--------+--------+----------------+-------------------+ +---------+--------+--+--------+--+---------+ VertebralPSV cm/s52EDV cm/s17Antegrade +---------+--------+--+--------+--+---------+  Summary: Right Carotid: Velocities in the right ICA are consistent with a 60-79%                stenosis, velocities are at the very top of this range. Left Carotid: Velocities in the left ICA are consistent with a 1-39% stenosis. Vertebrals:  Left vertebral artery demonstrates antegrade flow. Right vertebral              artery demonstrates high resistant flow. Subclavians: Normal flow hemodynamics were seen in bilateral subclavian              arteries. *See table(s) above for measurements and observations.  Electronically signed by Ruta Hinds MD on 01/20/2019 at 4:33:52 PM.    Final     Assessment & Plan:   There are no diagnoses linked to this encounter.   No orders of the defined types were placed in this encounter.    Follow-up: No follow-ups on file.  [name], MD

## 2019-05-25 NOTE — Assessment & Plan Note (Addendum)
Pt seeing Dr Lucia Gaskins, ENT: on IMIQUIMOD 5% cream - dermatitis vs cellulitis. Empiric Doxy Triamc oint  Tylenol prn The pt wants a 2nd opinion - - wants to go to [place]

## 2019-05-28 NOTE — Assessment & Plan Note (Signed)
Plavix Lasartan, Toprol, Isosorbide No relapse

## 2019-06-08 ENCOUNTER — Telehealth: Payer: Self-pay

## 2019-06-08 MED ORDER — REPATHA SURECLICK 140 MG/ML ~~LOC~~ SOAJ: 140.0000 mg | SUBCUTANEOUS | 3 refills | Status: DC

## 2019-06-08 NOTE — Telephone Encounter (Signed)
Pt's daughter called stating they received $2500 grant towards the repatha. rx sent and instructed them to call us if they have issues. Pt voiced understanding.

## 2019-06-09 MED ORDER — REPATHA SURECLICK 140 MG/ML ~~LOC~~ SOAJ: 140.0000 mg | SUBCUTANEOUS | 3 refills | Status: DC

## 2019-06-09 NOTE — Addendum Note (Signed)
Addended by: Allean Found on: 06/09/2019 02:56 PM   Modules accepted: Orders

## 2019-06-09 NOTE — Telephone Encounter (Signed)
Pt called stating that they are approved for the [employer]. rx sent and pt voiced understanding

## 2019-06-15 ENCOUNTER — Ambulatory Visit (INDEPENDENT_AMBULATORY_CARE_PROVIDER_SITE_OTHER): Payer: PPO | Admitting: Otolaryngology

## 2019-06-22 ENCOUNTER — Ambulatory Visit (INDEPENDENT_AMBULATORY_CARE_PROVIDER_SITE_OTHER): Payer: No Typology Code available for payment source | Admitting: Otolaryngology

## 2019-07-01 ENCOUNTER — Ambulatory Visit (INDEPENDENT_AMBULATORY_CARE_PROVIDER_SITE_OTHER): Payer: No Typology Code available for payment source | Admitting: Otolaryngology

## 2019-08-12 ENCOUNTER — Encounter: Payer: Self-pay | Admitting: Cardiovascular Disease

## 2019-08-12 ENCOUNTER — Ambulatory Visit (INDEPENDENT_AMBULATORY_CARE_PROVIDER_SITE_OTHER): Payer: PPO | Admitting: Cardiovascular Disease

## 2019-08-12 ENCOUNTER — Other Ambulatory Visit: Payer: Self-pay

## 2019-08-12 VITALS — BP 120/70 | HR 70 | Ht 68.0 in | Wt 206.0 lb

## 2019-08-12 DIAGNOSIS — I5042 Chronic combined systolic (congestive) and diastolic (congestive) heart failure: Secondary | ICD-10-CM | POA: Diagnosis not present

## 2019-08-12 DIAGNOSIS — I10 Essential (primary) hypertension: Secondary | ICD-10-CM

## 2019-08-12 DIAGNOSIS — E7800 Pure hypercholesterolemia, unspecified: Secondary | ICD-10-CM

## 2019-08-12 DIAGNOSIS — I2510 Atherosclerotic heart disease of native coronary artery without angina pectoris: Secondary | ICD-10-CM

## 2019-08-12 DIAGNOSIS — I6523 Occlusion and stenosis of bilateral carotid arteries: Secondary | ICD-10-CM | POA: Diagnosis not present

## 2019-08-12 NOTE — Patient Instructions (Signed)
Medication Instructions:  Your physician recommends that you continue on your current medications as directed. Please refer to the Current Medication list given to you today.  *If you need a refill on your cardiac medications before your next appointment, please call your pharmacy*  Lab Work: NONE  Testing/Procedures: NONE  Follow-Up: At [facility], you and your health needs are our priority.  As part of our continuing mission to provide you with exceptional heart care, we have created designated Provider Care Teams.  These Care Teams include your primary Cardiologist (physician) and Advanced Practice Providers (APPs -  Physician Assistants and Nurse Practitioners) who all work together to provide you with the care you need, when you need it.  We recommend signing up for the patient portal called "MyChart".  Sign up information is provided on this After Visit Summary.  MyChart is used to connect with patients for Virtual Visits (Telemedicine).  Patients are able to view lab/test results, encounter notes, upcoming appointments, etc.  Non-urgent messages can be sent to your provider as well.   To learn more about what you can do with MyChart, go to [email or web address].    Your next appointment:   12 month(s)  The format for your next appointment:   In Person  Provider:   You may see Tiffany Bonneauville, MD or one of the following Advanced Practice Providers on your designated Care Team:    Luke Kilroy, PA-C  Callie Goodrich, PA-C  Jesse Cleaver, FNP     

## 2019-08-12 NOTE — Progress Notes (Signed)
Cardiology Office Note   Date:  08/12/2019   ID:  Richard Burgess, DOB [birth year], MRN [id number]  PCP:  Cassandria Anger, MD  Cardiologist:   [name], MD   No chief complaint on file.   History of Present Illness: Richard Burgess is an 86 y.o. male with chronic systolic and diastolic heart failure, hypertension, hyperlipidemia, aortic aneurysm, carotid stenosis, R subclavian steal, who presents for follow up.  Richard Burgess presented to the ED with NSTEMI 01/2017.  He underwent left heart catheterization 01/2017 and was noted to have 3v CAD (100% RCA, 40% OM, 60% ostial LAD, and 80% OM1.  LVEF was 45-50%.  He had L-->R collaterals to the LAD.  Medical management was recommended.  He was started on clopidogrel with plans for 12 months of DAPT.  He had a history of statin intolerance.  He was started on Zetia in the hospital.  LDL was 94 he was referred to lipid clinic and prescribed Repatha.   He reported exertional angina and was started on isosorbide.    Richard Burgess continues to do well.  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  He has no exertional chest pain or shortness of breath.  He stays very active.  He has no lower extremity edema, orthopnea, or PND.  His only complaint is some pain in his left ankle due to an injury in the 1970s.  He had a couple episodes where he felt like he stopped breathing overnight.  He had a hard time going back to sleep.  He thinks he may been having a bad dream.  He has no daytime somnolence and feels well-rested when he gets up in the mornings.  He thinks that he snores mildly.  He has never had a sleep study and does not want one.  Past Medical History:  Diagnosis Date  . Allergy   . BPH (benign prostatic hypertrophy)   . Carotid artery occlusion   . Diverticulosis of colon   . ED (erectile dysfunction)   . GERD (gastroesophageal reflux disease)   . Glucose intolerance (impaired glucose tolerance)   . History of pancreatitis    . Hx-TIA (transient ischemic attack)   . Hyperlipidemia   . Hypertension   . Hypertention, malignant, with acute intensive management   . Low back pain   . Osteoarthritis   . PVD (peripheral vascular disease) ([place])    Bilateral carotid  . Tubular adenoma of colon 08/1991   One with carcinoma IN [facility] 1993, multiple adenomatous    Past Surgical History:  Procedure Laterality Date  . APPENDECTOMY    . BASAL CELL CARCINOMA EXCISION    . CHOLECYSTECTOMY    . COLONOSCOPY    . ESOPHAGOGASTRODUODENOSCOPY    . LEFT HEART CATH AND CORONARY ANGIOGRAPHY N/A 01/26/2017   Procedure: LEFT HEART CATH AND CORONARY ANGIOGRAPHY;  Surgeon: Lorretta Harp, MD;  Location: [facility];  Service: Cardiovascular;  Laterality: N/A;  . LUMBAR SPINE SURGERY     X4     Current Outpatient Medications  Medication Sig Dispense Refill  . aspirin EC 81 MG tablet Take 81 mg by mouth daily.    . cholecalciferol (VITAMIN D) 1000 units tablet Take 1,000 Units by mouth daily.    . cyanocobalamin 500 MCG tablet Take 500 mcg by mouth daily. Vitamin B12    . doxycycline (VIBRA-TABS) 100 MG tablet Take 1 tablet (100 mg total) by mouth 2 (two)  times daily. 20 tablet 0  . Evolocumab (REPATHA SURECLICK) [id number] MG/ML SOAJ Inject 140 mg into the skin every 14 (fourteen) days. 6 pen 3  . imiquimod (ALDARA) 5 % cream Apply topically daily. Apply to the right ear daily for the next 2 weeks.  Apply to the facial lesion daily for 4 weeks. 12 each 0  . isosorbide mononitrate (IMDUR) 60 MG 24 hr tablet Take 1 tablet (60 mg total) by mouth daily. 90 tablet 3  . linaclotide (LINZESS) 290 MCG CAPS capsule Take 1 capsule (290 mcg total) by mouth daily as needed. 30 capsule 11  . losartan (COZAAR) 25 MG tablet Take 1 tablet (25 mg total) by mouth daily. 90 tablet 3  . metoprolol succinate (TOPROL XL) 25 MG 24 hr tablet Take 1 tablet (25 mg total) by mouth daily. 90 tablet 3  . Multiple Vitamins-Minerals (PRESERVISION AREDS 2) CAPS  Take 1 capsule by mouth daily with supper.    . mupirocin ointment (BACTROBAN) 2 % On leg wound w/dressing change qd or bid 30 g 0  . nitroGLYCERIN (NITROSTAT) 0.4 MG SL tablet Place 1 tablet (0.4 mg total) under the tongue every 5 (five) minutes as needed for chest pain. 20 tablet 1  . Polyethyl Glycol-Propyl Glycol (SYSTANE OP) Place 1 drop into both eyes 4 (four) times daily as needed (dry eyes).    . traMADol (ULTRAM) 50 MG tablet TAKE 1 TABLET BY MOUTH EVERY 8 HOURS AS NEEDED 90 tablet 3  . traZODone (DESYREL) 150 MG tablet Take 150 mg by mouth at bedtime.    . triamcinolone ointment (KENALOG) 0.5 % Apply 1 application topically 2 (two) times daily. 30 g 0   No current facility-administered medications for this visit.    Allergies:   Amlodipine, Livalo [pitavastatin], Losartan, Oxycodone, Pravastatin sodium, Zetia [ezetimibe], Rosuvastatin, and Statins    Social History:  The patient  reports that he quit smoking about 51 years ago. His smoking use included cigarettes. He started smoking about 71 years ago. He has a 10.00 pack-year smoking history. He has never used smokeless tobacco. He reports current alcohol use of about 7.0 - 14.0 standard drinks of alcohol per week. He reports that he does not use drugs.   Family History:  The patient's family history includes Colon cancer in his sister; Dementia in his father; Diabetes in his mother; Peripheral vascular disease in his brother.    ROS:  Please see the history of present illness.   Otherwise, review of systems are positive for none.   All other systems are reviewed and negative.    PHYSICAL EXAM: VS:  BP 120/70   Pulse 70   Ht 5\' 8"  (1.727 m)   Wt 206 lb (93.4 kg)   SpO2 97%   BMI 31.32 kg/m  , BMI Body mass index is 31.32 kg/m. GENERAL:  Well appearing HEENT: Pupils equal round and reactive, fundi not visualized, oral mucosa unremarkable NECK:  No jugular venous distention, waveform within normal limits, carotid upstroke  brisk and symmetric, no bruits LUNGS:  Clear to auscultation bilaterally HEART:  RRR.  PMI not displaced or sustained,S1 and S2 within normal limits, no S3, no S4, no clicks, no rubs, no murmurs ABD:  Flat, positive bowel sounds normal in frequency in pitch, no bruits, no rebound, no guarding, no midline pulsatile mass, no hepatomegaly, no splenomegaly EXT:  2 plus pulses throughout, no edema, no cyanosis no clubbing SKIN:  No rashes no nodules NEURO:  Cranial nerves II  through XII grossly intact, motor grossly intact throughout [facility]:  Cognitively intact, oriented to person place and time   EKG:  EKG is ordered today. 12/04/17: Sinus rhythm.  Rate 60 bpm.  RBBB. 06/03/18: Sinus rhythm.  Rate 64 bpm.  Right bundle branch block. 08/12/19: Sinus rhythm.  Rate 70 bpm.  RBBB.   LEFT HEART CATH AND CORONARY ANGIOGRAPHY 01/26/17  Conclusion   Prox RCA to Mid RCA lesion, 100 %stenosed.  Ost LM lesion, 40 %stenosed.  Ost LAD lesion, 60 %stenosed.  1st Mrg lesion, 80 %stenosed.  There is mild to moderate left ventricular systolic dysfunction.  LV end diastolic pressure is mildly elevated.  The left ventricular ejection fraction is 45-50% by visual estimate.    Recent Labs: No results found for requested labs within last 8760 hours.    Lipid Panel    Component Value Date/Time   CHOL 110 04/25/2019 1010   TRIG 151 (H) 04/25/2019 1010   HDL 42 04/25/2019 1010   CHOLHDL 2.6 04/25/2019 1010   CHOLHDL 2 05/31/2018 0915   VLDL 27.2 05/31/2018 0915   LDLCALC 42 04/25/2019 1010   LDLDIRECT 153.0 02/18/2016 0826      Wt Readings from Last 3 Encounters:  08/12/19 206 lb (93.4 kg)  05/25/19 204 lb (92.5 kg)  01/20/19 200 lb 11.2 oz (91 kg)      ASSESSMENT AND PLAN:  # Obstructive CAD: # Hyperlipidemia:   Richard Burgess has obstructive CAD that we are medically managing. No recurrent angina since starting Imur.  He remains very active and has no symptoms.  Continue aspirin,  isosorbide, and metoprolol.  Lipids at goal on Repatha.  He did not tolerate statins or Zetia.  # Chronic systolic and diastolic heart failure:  LVEF 45-50% on LV gram.  Volume status is stable and he has no symptoms of heart failure.  Continue metoprolol succinate.      Current medicines are reviewed at length with the patient today.  The patient does not have concerns regarding medicines.  The following changes have been made:  None  Labs/ tests ordered today include:   Orders Placed This Encounter  Procedures  . EKG 12-Lead     Disposition:   FU with Richard Creamer C. Oval Linsey, MD, [facility] in 1 year.    Signed, Shawnn Bouillon C. Oval Linsey, MD, [facility]  08/12/2019 10:17 AM    [place]

## 2019-09-22 ENCOUNTER — Other Ambulatory Visit: Payer: Self-pay | Admitting: Cardiology

## 2019-10-24 NOTE — Telephone Encounter (Signed)
completed

## 2019-11-04 ENCOUNTER — Telehealth: Payer: Self-pay | Admitting: Cardiovascular Disease

## 2019-11-04 NOTE — Telephone Encounter (Signed)
Will forward to [facility] D/[place]

## 2019-11-04 NOTE — Telephone Encounter (Signed)
    Pt c/o medication issue:  1. Name of Medication: Evolocumab (REPATHA SURECLICK) [id number] MG/ML SOAJ  2. How are you currently taking this medication (dosage and times per day)?   3. Are you having a reaction (difficulty breathing--STAT)?   4. What is your medication issue? Pt's daughter called, she said patient assistance for repatha has ran out and needs help to renew it

## 2019-11-08 NOTE — Telephone Encounter (Signed)
lmomed the pt to reapply at the [facility] by phone

## 2020-03-07 ENCOUNTER — Other Ambulatory Visit: Payer: Self-pay

## 2020-03-07 DIAGNOSIS — I6523 Occlusion and stenosis of bilateral carotid arteries: Secondary | ICD-10-CM

## 2020-03-28 ENCOUNTER — Other Ambulatory Visit: Payer: Self-pay

## 2020-03-28 ENCOUNTER — Ambulatory Visit (HOSPITAL_COMMUNITY)
Admission: RE | Admit: 2020-03-28 | Discharge: 2020-03-28 | Disposition: A | Discharge status: Home / Self Care | Payer: No Typology Code available for payment source | Source: Ambulatory Visit | Attending: Physician Assistant | Admitting: Physician Assistant

## 2020-03-28 ENCOUNTER — Ambulatory Visit (INDEPENDENT_AMBULATORY_CARE_PROVIDER_SITE_OTHER): Payer: PPO | Admitting: Physician Assistant

## 2020-03-28 VITALS — BP 135/68 | HR 67 | Temp 98.2°F | Resp 20 | Ht 68.0 in | Wt 205.3 lb

## 2020-03-28 DIAGNOSIS — I6523 Occlusion and stenosis of bilateral carotid arteries: Secondary | ICD-10-CM

## 2020-03-28 NOTE — Progress Notes (Signed)
Office Note     CC:  follow up Requesting Provider:  Plotnikov, Evie Lacks, MD  HPI: Richard Burgess is a 86 y.o. (08-10-1928) male who presents for follow up bilateral carotid stenosis. He has been followed for several years now for right greater than left ICA stenosis. He has no history of TIA or stroke symptoms. Hedenies a history of amaurosis fugax or monocular blindness, unilateral facial drooping, hemiplegia,orreceptive or expressive aphasia.   He denies any claudication symptoms, rest pain or non healing wounds. He stays very active doing yard work. He does have a lot of trouble with bilateral knee pain. He recently had knee injections for this which is helping some. He is a veteran so gets a lot of his medical care at the [place]. He just had his annual physical on Monday 10/18 and states that he is doing well. 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 Apparently his wife is very ill at this time.  The pt not on a statin for cholesterol management. Takes Repatha The pt is on a daily aspirin.   Other AC: no The pt is on BB and ARB for hypertension.   The pt is not diabetic. Tobacco hx: Former smoker, quit 1960's  Past Medical History:  Diagnosis Date  . Allergy   . BPH (benign prostatic hypertrophy)   . Carotid artery occlusion   . Diverticulosis of colon   . ED (erectile dysfunction)   . GERD (gastroesophageal reflux disease)   . Glucose intolerance (impaired glucose tolerance)   . History of pancreatitis   . Hx-TIA (transient ischemic attack)   . Hyperlipidemia   . Hypertension   . Hypertention, malignant, with acute intensive management   . Low back pain   . Osteoarthritis   . PVD (peripheral vascular disease) ([place])    Bilateral carotid  . Tubular adenoma of colon 08/1991   One with carcinoma IN [facility]IT[facility] 1993, multiple adenomatous    Past Surgical  History:  Procedure Laterality Date  . APPENDECTOMY    . BASAL CELL CARCINOMA EXCISION    . CHOLECYSTECTOMY    . COLONOSCOPY    . ESOPHAGOGASTRODUODENOSCOPY    . LEFT HEART CATH AND CORONARY ANGIOGRAPHY N/A 01/26/2017   Procedure: LEFT HEART CATH AND CORONARY ANGIOGRAPHY;  Surgeon: Lorretta Harp, MD;  Location: [facility];  Service: Cardiovascular;  Laterality: N/A;  . LUMBAR SPINE SURGERY     X4    Social History   Socioeconomic History  . Marital status: Married    Spouse name: Not on file  . Number of children: 3  . Years of education: Not on file  . Highest education level: Not on file  Occupational History  . Occupation: Retired-Construction  Tobacco Use  . Smoking status: Former Smoker    Packs/day: 0.50    Years: 20.00    Pack years: 10.00    Types: Cigarettes    Start date: 21    Quit date: 1970    Years since quitting: 51.8  . Smokeless tobacco: Never Used  . Tobacco comment: Daily caffeine use, regular exercise  Vaping Use  . Vaping Use: Never used  Substance and Sexual Activity  . Alcohol use: Yes    Alcohol/week: 7.0 - 14.0 standard drinks    Types: 7 - 14 Standard drinks or equivalent per week  Comment: occ beer  . Drug use: No  . Sexual activity: Yes  Other Topics Concern  . Not on file  Social History Narrative  . Not on file   Social Determinants of Health   Financial Resource Strain:   . Difficulty of Paying Living Expenses: Not on file  Food Insecurity:   . Worried About Charity fundraiser in the Last Year: Not on file  . Ran Out of Food in the Last Year: Not on file  Transportation Needs:   . Lack of Transportation (Medical): Not on file  . Lack of Transportation (Non-Medical): Not on file  Physical Activity:   . Days of Exercise per Week: Not on file  . Minutes of Exercise per Session: Not on file  Stress:   . Feeling of Stress : Not on file  Social Connections:   . Frequency of Communication with Friends and Family:  Not on file  . Frequency of Social Gatherings with Friends and Family: Not on file  . Attends Religious Services: Not on file  . Active Member of Clubs or Organizations: Not on file  . Attends Archivist Meetings: Not on file  . Marital Status: Not on file  Intimate Partner Violence:   . Fear of Current or Ex-Partner: Not on file  . Emotionally Abused: Not on file  . Physically Abused: Not on file  . Sexually Abused: Not on file    Family History  Problem Relation Age of Onset  . Dementia Father   . Diabetes Mother   . Peripheral vascular disease Brother   . Colon cancer Sister        dx in her 70's    Current Outpatient Medications  Medication Sig Dispense Refill  . aspirin EC 81 MG tablet Take 81 mg by mouth daily.    . cholecalciferol (VITAMIN D) 1000 units tablet Take 1,000 Units by mouth daily.    . cyanocobalamin 500 MCG tablet Take 500 mcg by mouth daily. Vitamin B12    . Evolocumab (REPATHA SURECLICK) 654 MG/ML SOAJ Inject 140 mg into the skin every 14 (fourteen) days. 6 pen 3  . imiquimod (ALDARA) 5 % cream Apply topically daily. Apply to the right ear daily for the next 2 weeks.  Apply to the facial lesion daily for 4 weeks. 12 each 0  . isosorbide mononitrate (IMDUR) 60 MG 24 hr tablet Take 1 tablet (60 mg total) by mouth daily. 90 tablet 3  . losartan (COZAAR) 25 MG tablet TAKE 1 TABLET BY MOUTH EVERY DAY 90 tablet 3  . metoprolol succinate (TOPROL XL) 25 MG 24 hr tablet Take 1 tablet (25 mg total) by mouth daily. 90 tablet 3  . Multiple Vitamins-Minerals (PRESERVISION AREDS 2) CAPS Take 1 capsule by mouth daily with supper.    . mupirocin ointment (BACTROBAN) 2 % On leg wound w/dressing change qd or bid 30 g 0  . nitroGLYCERIN (NITROSTAT) 0.4 MG SL tablet Place 1 tablet (0.4 mg total) under the tongue every 5 (five) minutes as needed for chest pain. 20 tablet 1  . Polyethyl Glycol-Propyl Glycol (SYSTANE OP) Place 1 drop into both eyes 4 (four) times daily as  needed (dry eyes).    . traMADol (ULTRAM) 50 MG tablet TAKE 1 TABLET BY MOUTH EVERY 8 HOURS AS NEEDED 90 tablet 3  . traZODone (DESYREL) 150 MG tablet Take 150 mg by mouth at bedtime.    . triamcinolone ointment (KENALOG) 0.5 % Apply 1 application topically  2 (two) times daily. 30 g 0   No current facility-administered medications for this visit.    Allergies  Allergen Reactions  . Amlodipine Other (See Comments)    Weakness in legs  . Livalo [Pitavastatin]     Pain, SOB  . Oxycodone Other (See Comments)    crazy  . Pravastatin Sodium Other (See Comments)    REACTION: bad dreams  . Zetia [Ezetimibe]     LEG PAIN   . Rosuvastatin Palpitations  . Statins Palpitations and Other (See Comments)    REACTION: aches, bad dreams     REVIEW OF SYSTEMS:  [X]  denotes positive finding, [ ]  denotes negative finding Cardiac  Comments:  Chest pain or chest pressure:    Shortness of breath upon exertion:    Short of breath when lying flat:    Irregular heart rhythm:        Vascular    Pain in calf, thigh, or hip brought on by ambulation:    Pain in feet at night that wakes you up from your sleep:     Blood clot in your veins:    Leg swelling:         Pulmonary    Oxygen at home:    Productive cough:     Wheezing:         Neurologic    Sudden weakness in arms or legs:     Sudden numbness in arms or legs:     Sudden onset of difficulty speaking or slurred speech:    Temporary loss of vision in one eye:     Problems with dizziness:         Gastrointestinal    Blood in stool:     Vomited blood:         Genitourinary    Burning when urinating:     Blood in urine:        Psychiatric    Major depression:         Hematologic    Bleeding problems:    Problems with blood clotting too easily:        Skin    Rashes or ulcers:        Constitutional    Fever or chills:      PHYSICAL EXAMINATION:  Vitals:   03/28/20 1048 03/28/20 1050  BP: 135/71 135/68  Pulse: 67     Resp: 20   Temp: 98.2 F (36.8 C)   TempSrc: Temporal   SpO2: 95%   Weight: 205 lb 4.8 oz (93.1 kg)   Height: 5\' 8"  (1.727 m)     General:  WDWN in NAD; vital signs documented above Gait: Normal HENT: WNL, normocephalic Pulmonary: normal non-labored breathing , without wheezing Cardiac: regular HR, without  Murmurs without carotid bruit Abdomen: soft, NT, no masses Vascular Exam/Pulses:  Right Left  Radial 2+ (normal) 2+ (normal)  Femoral 2+ (normal) 2+ (normal)  Popliteal 2+ (normal) 2+ (normal)  DP 2+ (normal) 2+ (normal)  PT 1+ (weak) 1+ (weak)   Extremities: without ischemic changes, without Gangrene , without cellulitis; without open wounds;  Musculoskeletal: no muscle wasting or atrophy  Neurologic: A&O X 3;  No focal weakness or paresthesias are detected Psychiatric:  The pt has Normal affect.   Non-Invasive Vascular Imaging:  03/28/20 Right Carotid Findings:  +----------+--------+--------+--------+------------------+--------+       PSV cm/sEDV cm/sStenosisPlaque DescriptionComments  +----------+--------+--------+--------+------------------+--------+  CCA Prox 58   11                      +----------+--------+--------+--------+------------------+--------+  CCA Mid  68   13       heterogenous         +----------+--------+--------+--------+------------------+--------+  CCA Distal66   11                      +----------+--------+--------+--------+------------------+--------+  ICA Prox 426   123   80-99% calcific           +----------+--------+--------+--------+------------------+--------+  ICA Mid  67   14                      +----------+--------+--------+--------+------------------+--------+  ICA Distal27   10                       +----------+--------+--------+--------+------------------+--------+  ECA    136   3                       +----------+--------+--------+--------+------------------+--------+   +----------+--------+-------+---------+-------------------+       PSV cm/sEDV cmsDescribe Arm Pressure (mmHG)  +----------+--------+-------+---------+-------------------+  Subclavian104       Turbulent            +----------+--------+-------+---------+-------------------+   +---------+--------+--+--------+-+---------+  VertebralPSV cm/s33EDV cm/s9Antegrade  +---------+--------+--+--------+-+---------+      Left Carotid Findings:  +----------+--------+--------+--------+------------------+--------+       PSV cm/sEDV cm/sStenosisPlaque DescriptionComments  +----------+--------+--------+--------+------------------+--------+  CCA Prox 93   18                      +----------+--------+--------+--------+------------------+--------+  CCA Mid  84   15                      +----------+--------+--------+--------+------------------+--------+  CCA Distal75   13                      +----------+--------+--------+--------+------------------+--------+  ICA Prox 77   14   1-39%                 +----------+--------+--------+--------+------------------+--------+  ICA Mid  61   14                      +----------+--------+--------+--------+------------------+--------+  ICA Distal69   18                      +----------+--------+--------+--------+------------------+--------+  ECA    108   14                      +----------+--------+--------+--------+------------------+--------+    +----------+--------+--------+----------------+-------------------+       PSV cm/sEDV cm/sDescribe    Arm Pressure (mmHG)  +----------+--------+--------+----------------+-------------------+  [id number]       Multiphasic, WNL            +----------+--------+--------+----------------+-------------------+   +---------+--------+--+--------+--+---------+  VertebralPSV cm/s38EDV cm/s12Antegrade  +---------+--------+--+--------+--+---------+     ASSESSMENT/PLAN:: 86 y.o. male here for follow up for bilateral carotid stenosis. He has been monitored for several years now.He remains without symptoms. On duplex today his right ICA stenosis has progressed and is now 80-99% (EDV 123 cm/s).  I have discussed these findings with he and his daughter who is present with him today. I recommend that he have an intervention at this point as a preventative operation to reduce stroke risk. He is fairly active with good quality of life and both he and his daughter are agreeable to proceed with further studies and follow up with Dr. Trula Slade to discuss surgery. - he will  continue his Aspirin and Repatha - Re discussed symptoms of stroke/ TIA and should these occur he should seek immediate medical care -I have scheduled him for a CTA neck and I will have him follow up with Dr. Trula Slade in next couple weeks to review CTA and discuss CEA vs TCAR   Karoline Caldwell, PA-C [facility] [phone]  Clinic MD: Dr.Fields

## 2020-04-03 ENCOUNTER — Other Ambulatory Visit: Payer: Self-pay

## 2020-04-03 DIAGNOSIS — I6523 Occlusion and stenosis of bilateral carotid arteries: Secondary | ICD-10-CM

## 2020-04-09 ENCOUNTER — Other Ambulatory Visit: Payer: Self-pay

## 2020-04-09 DIAGNOSIS — I6523 Occlusion and stenosis of bilateral carotid arteries: Secondary | ICD-10-CM

## 2020-04-10 ENCOUNTER — Ambulatory Visit
Admission: RE | Admit: 2020-04-10 | Discharge: 2020-04-10 | Disposition: A | Discharge status: Home / Self Care | Payer: PPO | Source: Ambulatory Visit | Attending: Surgery | Admitting: Surgery

## 2020-04-10 DIAGNOSIS — I6523 Occlusion and stenosis of bilateral carotid arteries: Secondary | ICD-10-CM

## 2020-04-10 MED ORDER — IOPAMIDOL (ISOVUE-370) INJECTION 76%
75.0000 mL | Freq: Once | INTRAVENOUS | Status: AC | PRN
  Administered 2020-04-10: 75 mL via INTRAVENOUS

## 2020-04-16 ENCOUNTER — Ambulatory Visit (INDEPENDENT_AMBULATORY_CARE_PROVIDER_SITE_OTHER): Payer: No Typology Code available for payment source | Admitting: Surgery

## 2020-04-16 ENCOUNTER — Other Ambulatory Visit: Payer: Self-pay

## 2020-04-16 ENCOUNTER — Encounter: Payer: Self-pay | Admitting: Surgery

## 2020-04-16 VITALS — BP 142/72 | HR 73 | Temp 98.4°F | Resp 20 | Ht 68.0 in | Wt 204.0 lb

## 2020-04-16 DIAGNOSIS — I6523 Occlusion and stenosis of bilateral carotid arteries: Secondary | ICD-10-CM

## 2020-04-16 MED ORDER — ICOSAPENT ETHYL 1 G PO CAPS: 2.0000 g | ORAL_CAPSULE | Freq: Two times a day (BID) | ORAL | 3 refills | Status: DC

## 2020-04-16 MED ORDER — CLOPIDOGREL BISULFATE 75 MG PO TABS: 75.0000 mg | ORAL_TABLET | Freq: Every day | ORAL | 11 refills | Status: DC

## 2020-04-16 NOTE — Progress Notes (Signed)
[facility]  Patient name: Richard Burgess MRN: [id number] DOB: 1928/06/30 Sex: male   REASON FOR VISIT:    Follow up  HISOTRY OF PRESENT ILLNESS:    Richard Burgess is a 86 y.o. male who I have been following for carotid disease.  He was recently seen and found to have progression of disease on the right.  He had a CT scan performed.  He is back to discuss those results.  He is asymptomatic.  Specifically, he denies numbness or weakness in either extremity.  He denies slurred speech.  He denies amaurosis fugax.  Patient recommended for hypertension.  He has a statin allergy and is taking Repatha.  He has impaired glucose tolerance.  He remains very active   PAST MEDICAL HISTORY:   Past Medical History:  Diagnosis Date  . Allergy   . BPH (benign prostatic hypertrophy)   . Carotid artery occlusion   . Diverticulosis of colon   . ED (erectile dysfunction)   . GERD (gastroesophageal reflux disease)   . Glucose intolerance (impaired glucose tolerance)   . History of pancreatitis   . Hx-TIA (transient ischemic attack)   . Hyperlipidemia   . Hypertension   . Hypertention, malignant, with acute intensive management   . Low back pain   . Osteoarthritis   . PVD (peripheral vascular disease) ([place])    Bilateral carotid  . Tubular adenoma of colon 08/1991   One with carcinoma IN [facility] 1993, multiple adenomatous     FAMILY HISTORY:   Family History  Problem Relation Age of Onset  . Dementia Father   . Diabetes Mother   . Peripheral vascular disease Brother   . Colon cancer Sister        dx in her 66's    SOCIAL HISTORY:   Social History   Tobacco Use  . Smoking status: Former Smoker    Packs/day: 0.50    Years: 20.00    Pack years: 10.00    Types: Cigarettes    Start date: 35    Quit date: 1970    Years since quitting: 51.8  . Smokeless tobacco: Never Used  . Tobacco comment: Daily caffeine use,  regular exercise  Substance Use Topics  . Alcohol use: Yes    Alcohol/week: 7.0 - 14.0 standard drinks    Types: 7 - 14 Standard drinks or equivalent per week    Comment: occ beer     ALLERGIES:   Allergies  Allergen Reactions  . Amlodipine Other (See Comments)    Weakness in legs  . Livalo [Pitavastatin]     Pain, SOB  . Oxycodone Other (See Comments)    crazy  . Pravastatin Sodium Other (See Comments)    REACTION: bad dreams  . Zetia [Ezetimibe]     LEG PAIN   . Rosuvastatin Palpitations  . Statins Palpitations and Other (See Comments)    REACTION: aches, bad dreams     CURRENT MEDICATIONS:   Current Outpatient Medications  Medication Sig Dispense Refill  . aspirin EC 81 MG tablet Take 81 mg by mouth daily.    . cholecalciferol (VITAMIN D) 1000 units tablet Take 1,000 Units by mouth daily.    . cyanocobalamin 500 MCG tablet Take 500 mcg by mouth daily. Vitamin B12    . Evolocumab (REPATHA SURECLICK) 951 MG/ML SOAJ Inject 140 mg into the skin every 14 (fourteen) days. 6 pen 3  . icosapent Ethyl (VASCEPA) 1 g capsule Take 2  capsules (2 g total) by mouth 2 (two) times daily. 120 capsule 3  . imiquimod (ALDARA) 5 % cream Apply topically daily. Apply to the right ear daily for the next 2 weeks.  Apply to the facial lesion daily for 4 weeks. 12 each 0  . isosorbide mononitrate (IMDUR) 60 MG 24 hr tablet Take 1 tablet (60 mg total) by mouth daily. 90 tablet 3  . losartan (COZAAR) 25 MG tablet TAKE 1 TABLET BY MOUTH EVERY DAY 90 tablet 3  . metoprolol succinate (TOPROL XL) 25 MG 24 hr tablet Take 1 tablet (25 mg total) by mouth daily. 90 tablet 3  . Multiple Vitamins-Minerals (PRESERVISION AREDS 2) CAPS Take 1 capsule by mouth daily with supper.    . mupirocin ointment (BACTROBAN) 2 % On leg wound w/dressing change qd or bid 30 g 0  . nitroGLYCERIN (NITROSTAT) 0.4 MG SL tablet Place 1 tablet (0.4 mg total) under the tongue every 5 (five) minutes as needed for chest pain. 20  tablet 1  . Polyethyl Glycol-Propyl Glycol (SYSTANE OP) Place 1 drop into both eyes 4 (four) times daily as needed (dry eyes).    . traMADol (ULTRAM) 50 MG tablet TAKE 1 TABLET BY MOUTH EVERY 8 HOURS AS NEEDED 90 tablet 3  . triamcinolone ointment (KENALOG) 0.5 % Apply 1 application topically 2 (two) times daily. 30 g 0   No current facility-administered medications for this visit.    REVIEW OF SYSTEMS:   [X]  denotes positive finding, [ ]  denotes negative finding Cardiac  Comments:  Chest pain or chest pressure:    Shortness of breath upon exertion:    Short of breath when lying flat:    Irregular heart rhythm:        Vascular    Pain in calf, thigh, or hip brought on by ambulation:    Pain in feet at night that wakes you up from your sleep:     Blood clot in your veins:    Leg swelling:         Pulmonary    Oxygen at home:    Productive cough:     Wheezing:         Neurologic    Sudden weakness in arms or legs:     Sudden numbness in arms or legs:     Sudden onset of difficulty speaking or slurred speech:    Temporary loss of vision in one eye:     Problems with dizziness:         Gastrointestinal    Blood in stool:     Vomited blood:         Genitourinary    Burning when urinating:     Blood in urine:        Psychiatric    Major depression:         Hematologic    Bleeding problems:    Problems with blood clotting too easily:        Skin    Rashes or ulcers:        Constitutional    Fever or chills:      PHYSICAL EXAM:   Vitals:   04/16/20 1049 04/16/20 1051  BP: 125/70 (!) 142/72  Pulse: 73   Resp: 20   Temp: 98.4 F (36.9 C)   SpO2: 94%   Weight: 204 lb (92.5 kg)   Height: 5\' 8"  (1.727 m)     GENERAL: The patient is a well-nourished male, in no acute  distress. The vital signs are documented above. CARDIAC: There is a regular rate and rhythm.  PULMONARY: Non-labored respirations MUSCULOSKELETAL: There are no major deformities or  cyanosis. NEUROLOGIC: No focal weakness or paresthesias are detected. SKIN: There are no ulcers or rashes noted. PSYCHIATRIC: The patient has a normal affect.  STUDIES:   I have reviewed the following CTA: 1. High-grade, near occlusive atherosclerotic stenosis at the origin of the right ICA with radiographic string sign. 2. Soft and calcified plaque within the mid to distal right CCA with less than 50% stenosis. 3. The left CCA and ICA are patent within the neck without stenosis, and with only mild atherosclerotic plaque. 4. The right vertebral artery is developmentally diminutive. This vessel is only faintly and intermittently seen at the V1 and proximal V2 segment levels, and may be functionally occluded. There is more robust enhancement of this vessel at the distal V2 and V3 segment levels. 5. The dominant left vertebral artery is patent within the neck. Moderate/severe atherosclerotic stenosis at the origin of this vessel.  MEDICAL ISSUES:   Asymptomatic high-grade right carotid stenosis: Based off the patient's age is a high risk factor, I think his best option is for a TCAR.  I discussed the details of the procedure including the risk of stroke and access site complications.  All questions were answered.  I am starting him on Plavix today.  He takes Repatha as he has a statin allergy.  This will be scheduled in the immediate future.    Leia Alf, MD, FACS [facility] [phone] Pager [phone]

## 2020-04-16 NOTE — H&P (View-Only) (Signed)
[facility] [place]  Patient name: Richard Burgess MRN: [id number] DOB: 05-14-1929 Sex: male   REASON FOR VISIT:    Follow up  HISOTRY OF PRESENT ILLNESS:    Richard Burgess is a 86 y.o. male who I have been following for carotid disease.  He was recently seen and found to have progression of disease on the right.  He had a CT scan performed.  He is back to discuss those results.  He is asymptomatic.  Specifically, he denies numbness or weakness in either extremity.  He denies slurred speech.  He denies amaurosis fugax.  Patient recommended for hypertension.  He has a statin allergy and is taking Repatha.  He has impaired glucose tolerance.  He remains very active   PAST MEDICAL HISTORY:   Past Medical History:  Diagnosis Date  . Allergy   . BPH (benign prostatic hypertrophy)   . Carotid artery occlusion   . Diverticulosis of colon   . ED (erectile dysfunction)   . GERD (gastroesophageal reflux disease)   . Glucose intolerance (impaired glucose tolerance)   . History of pancreatitis   . Hx-TIA (transient ischemic attack)   . Hyperlipidemia   . Hypertension   . Hypertention, malignant, with acute intensive management   . Low back pain   . Osteoarthritis   . PVD (peripheral vascular disease) ([place])    Bilateral carotid  . Tubular adenoma of colon 08/1991   One with carcinoma IN [facility] 1993, multiple adenomatous     FAMILY HISTORY:   Family History  Problem Relation Age of Onset  . Dementia Father   . Diabetes Mother   . Peripheral vascular disease Brother   . Colon cancer Sister        dx in her 47's    SOCIAL HISTORY:   Social History   Tobacco Use  . Smoking status: Former Smoker    Packs/day: 0.50    Years: 20.00    Pack years: 10.00    Types: Cigarettes    Start date: 41    Quit date: 1970    Years since quitting: 51.8  . Smokeless tobacco: Never Used  . Tobacco comment: Daily caffeine use,  regular exercise  Substance Use Topics  . Alcohol use: Yes    Alcohol/week: 7.0 - 14.0 standard drinks    Types: 7 - 14 Standard drinks or equivalent per week    Comment: occ beer     ALLERGIES:   Allergies  Allergen Reactions  . Amlodipine Other (See Comments)    Weakness in legs  . Livalo [Pitavastatin]     Pain, SOB  . Oxycodone Other (See Comments)    crazy  . Pravastatin Sodium Other (See Comments)    REACTION: bad dreams  . Zetia [Ezetimibe]     LEG PAIN   . Rosuvastatin Palpitations  . Statins Palpitations and Other (See Comments)    REACTION: aches, bad dreams     CURRENT MEDICATIONS:   Current Outpatient Medications  Medication Sig Dispense Refill  . aspirin EC 81 MG tablet Take 81 mg by mouth daily.    . cholecalciferol (VITAMIN D) 1000 units tablet Take 1,000 Units by mouth daily.    . cyanocobalamin 500 MCG tablet Take 500 mcg by mouth daily. Vitamin B12    . Evolocumab (REPATHA SURECLICK) 921 MG/ML SOAJ Inject 140 mg into the skin every 14 (fourteen) days. 6 pen 3  . icosapent Ethyl (VASCEPA) 1 g capsule Take 2  capsules (2 g total) by mouth 2 (two) times daily. 120 capsule 3  . imiquimod (ALDARA) 5 % cream Apply topically daily. Apply to the right ear daily for the next 2 weeks.  Apply to the facial lesion daily for 4 weeks. 12 each 0  . isosorbide mononitrate (IMDUR) 60 MG 24 hr tablet Take 1 tablet (60 mg total) by mouth daily. 90 tablet 3  . losartan (COZAAR) 25 MG tablet TAKE 1 TABLET BY MOUTH EVERY DAY 90 tablet 3  . metoprolol succinate (TOPROL XL) 25 MG 24 hr tablet Take 1 tablet (25 mg total) by mouth daily. 90 tablet 3  . Multiple Vitamins-Minerals (PRESERVISION AREDS 2) CAPS Take 1 capsule by mouth daily with supper.    . mupirocin ointment (BACTROBAN) 2 % On leg wound w/dressing change qd or bid 30 g 0  . nitroGLYCERIN (NITROSTAT) 0.4 MG SL tablet Place 1 tablet (0.4 mg total) under the tongue every 5 (five) minutes as needed for chest pain. 20  tablet 1  . Polyethyl Glycol-Propyl Glycol (SYSTANE OP) Place 1 drop into both eyes 4 (four) times daily as needed (dry eyes).    . traMADol (ULTRAM) 50 MG tablet TAKE 1 TABLET BY MOUTH EVERY 8 HOURS AS NEEDED 90 tablet 3  . triamcinolone ointment (KENALOG) 0.5 % Apply 1 application topically 2 (two) times daily. 30 g 0   No current facility-administered medications for this visit.    REVIEW OF SYSTEMS:   [X]  denotes positive finding, [ ]  denotes negative finding Cardiac  Comments:  Chest pain or chest pressure:    Shortness of breath upon exertion:    Short of breath when lying flat:    Irregular heart rhythm:        Vascular    Pain in calf, thigh, or hip brought on by ambulation:    Pain in feet at night that wakes you up from your sleep:     Blood clot in your veins:    Leg swelling:         Pulmonary    Oxygen at home:    Productive cough:     Wheezing:         Neurologic    Sudden weakness in arms or legs:     Sudden numbness in arms or legs:     Sudden onset of difficulty speaking or slurred speech:    Temporary loss of vision in one eye:     Problems with dizziness:         Gastrointestinal    Blood in stool:     Vomited blood:         Genitourinary    Burning when urinating:     Blood in urine:        Psychiatric    Major depression:         Hematologic    Bleeding problems:    Problems with blood clotting too easily:        Skin    Rashes or ulcers:        Constitutional    Fever or chills:      PHYSICAL EXAM:   Vitals:   04/16/20 1049 04/16/20 1051  BP: 125/70 (!) 142/72  Pulse: 73   Resp: 20   Temp: 98.4 F (36.9 C)   SpO2: 94%   Weight: 204 lb (92.5 kg)   Height: 5\' 8"  (1.727 m)     GENERAL: The patient is a well-nourished male, in no acute  distress. The vital signs are documented above. CARDIAC: There is a regular rate and rhythm.  PULMONARY: Non-labored respirations MUSCULOSKELETAL: There are no major deformities or  cyanosis. NEUROLOGIC: No focal weakness or paresthesias are detected. SKIN: There are no ulcers or rashes noted. PSYCHIATRIC: The patient has a normal affect.  STUDIES:   I have reviewed the following CTA: 1. High-grade, near occlusive atherosclerotic stenosis at the origin of the right ICA with radiographic string sign. 2. Soft and calcified plaque within the mid to distal right CCA with less than 50% stenosis. 3. The left CCA and ICA are patent within the neck without stenosis, and with only mild atherosclerotic plaque. 4. The right vertebral artery is developmentally diminutive. This vessel is only faintly and intermittently seen at the V1 and proximal V2 segment levels, and may be functionally occluded. There is more robust enhancement of this vessel at the distal V2 and V3 segment levels. 5. The dominant left vertebral artery is patent within the neck. Moderate/severe atherosclerotic stenosis at the origin of this vessel.  MEDICAL ISSUES:   Asymptomatic high-grade right carotid stenosis: Based off the patient's age is a high risk factor, I think his best option is for a TCAR.  I discussed the details of the procedure including the risk of stroke and access site complications.  All questions were answered.  I am starting him on Plavix today.  He takes Repatha as he has a statin allergy.  This will be scheduled in the immediate future.    Leia Alf, MD, FACS [facility] [phone] Pager [phone]

## 2020-04-17 ENCOUNTER — Inpatient Hospital Stay (HOSPITAL_COMMUNITY): Admission: RE | Admit: 2020-04-17 | Payer: PPO | Source: Ambulatory Visit

## 2020-04-20 ENCOUNTER — Encounter (HOSPITAL_COMMUNITY)
Admission: RE | Admit: 2020-04-20 | Discharge: 2020-04-20 | Disposition: A | Discharge status: Home / Self Care | Payer: PPO | Source: Ambulatory Visit | Attending: Surgery | Admitting: Surgery

## 2020-04-20 ENCOUNTER — Encounter (HOSPITAL_COMMUNITY): Payer: Self-pay

## 2020-04-20 ENCOUNTER — Other Ambulatory Visit: Payer: Self-pay

## 2020-04-20 DIAGNOSIS — I6529 Occlusion and stenosis of unspecified carotid artery: Secondary | ICD-10-CM | POA: Diagnosis not present

## 2020-04-20 DIAGNOSIS — Z79899 Other long term (current) drug therapy: Secondary | ICD-10-CM | POA: Diagnosis not present

## 2020-04-20 DIAGNOSIS — Z7982 Long term (current) use of aspirin: Secondary | ICD-10-CM | POA: Insufficient documentation

## 2020-04-20 DIAGNOSIS — I252 Old myocardial infarction: Secondary | ICD-10-CM | POA: Insufficient documentation

## 2020-04-20 DIAGNOSIS — Z01812 Encounter for preprocedural laboratory examination: Secondary | ICD-10-CM | POA: Insufficient documentation

## 2020-04-20 DIAGNOSIS — E785 Hyperlipidemia, unspecified: Secondary | ICD-10-CM | POA: Diagnosis not present

## 2020-04-20 DIAGNOSIS — I110 Hypertensive heart disease with heart failure: Secondary | ICD-10-CM | POA: Insufficient documentation

## 2020-04-20 DIAGNOSIS — I5042 Chronic combined systolic (congestive) and diastolic (congestive) heart failure: Secondary | ICD-10-CM | POA: Diagnosis not present

## 2020-04-20 DIAGNOSIS — I2510 Atherosclerotic heart disease of native coronary artery without angina pectoris: Secondary | ICD-10-CM | POA: Insufficient documentation

## 2020-04-20 DIAGNOSIS — Z951 Presence of aortocoronary bypass graft: Secondary | ICD-10-CM | POA: Insufficient documentation

## 2020-04-20 HISTORY — DX: Unspecified hearing loss, unspecified ear: H91.90

## 2020-04-20 HISTORY — DX: Presence of dental prosthetic device (complete) (partial): Z97.2

## 2020-04-20 HISTORY — DX: Herpesviral infection, unspecified: B00.9

## 2020-04-20 LAB — URINALYSIS, ROUTINE W REFLEX MICROSCOPIC
Bilirubin Urine: NEGATIVE
Glucose, UA: 50 mg/dL — AB
Hgb urine dipstick: NEGATIVE
Ketones, ur: NEGATIVE mg/dL
Leukocytes,Ua: NEGATIVE
Nitrite: NEGATIVE
Protein, ur: NEGATIVE mg/dL
Specific Gravity, Urine: 1.02 (ref 1.005–1.030)
pH: 6 (ref 5.0–8.0)

## 2020-04-20 LAB — SURGICAL PCR SCREEN
MRSA, PCR: NEGATIVE
Staphylococcus aureus: NEGATIVE

## 2020-04-20 LAB — COMPREHENSIVE METABOLIC PANEL
ALT: 21 U/L (ref 0–44)
AST: 20 U/L (ref 15–41)
Albumin: 3.9 g/dL (ref 3.5–5.0)
Alkaline Phosphatase: 62 U/L (ref 38–126)
Anion gap: 8 (ref 5–15)
BUN: 18 mg/dL (ref 8–23)
CO2: 23 mmol/L (ref 22–32)
Calcium: 9.5 mg/dL (ref 8.9–10.3)
Chloride: 107 mmol/L (ref 98–111)
Creatinine, Ser: 0.99 mg/dL (ref 0.61–1.24)
GFR, Estimated: >60 mL/min (ref >60)
Glucose, Bld: 127 mg/dL — ABNORMAL HIGH (ref 70–99)
Potassium: 4.5 mmol/L (ref 3.5–5.1)
Sodium: 138 mmol/L (ref 135–145)
Total Bilirubin: 1.1 mg/dL (ref 0.3–1.2)
Total Protein: 6.9 g/dL (ref 6.5–8.1)

## 2020-04-20 LAB — PROTIME-INR
INR: 1 (ref 0.8–1.2)
Prothrombin Time: 12.6 seconds (ref 11.4–15.2)

## 2020-04-20 LAB — CBC
HCT: 42.2 % (ref 39.0–52.0)
Hemoglobin: 13.9 g/dL (ref 13.0–17.0)
MCH: 30.3 pg (ref 26.0–34.0)
MCHC: 32.9 g/dL (ref 30.0–36.0)
MCV: 91.9 fL (ref 80.0–100.0)
Platelets: 201 10*3/uL (ref 150–400)
RBC: 4.59 MIL/uL (ref 4.22–5.81)
RDW: 13.6 % (ref 11.5–15.5)
WBC: 7.7 10*3/uL (ref 4.0–10.5)
nRBC: 0 % (ref 0.0–0.2)

## 2020-04-20 LAB — APTT: aPTT: 30 seconds (ref 24–36)

## 2020-04-20 NOTE — Progress Notes (Signed)
PCP - Dr Alain Marion Cardiologist - Dr [name]  Chest x-ray - n/a EKG - 08/12/19 Stress Test - 12/21/06 ECHO - n/a Cardiac Cath - 01/26/17  Anesthesia review: Yes  Coronavirus Screening Covid test scheduled on 04/23/20 Do you have any of the following symptoms:  Cough yes/no: No Fever (>100.75F)  yes/no: No Runny nose yes/no: No Sore throat yes/no: No Difficulty breathing/shortness of breath  yes/no: No  Have you traveled in the last 14 days and where? yes/no: No  Patient verbalized understanding of instructions that were given to them at the PAT appointment.

## 2020-04-20 NOTE — Progress Notes (Signed)
[place], [place] - [place] [phone] [place] [address] Phone: [phone] Fax: [phone]      Your procedure is scheduled on Wednesday, 04/25/20.  Report to [facility] Main Entrance "A" at 6:30 A.M., and check in at the Admitting office.  Call this number if you have problems the morning of surgery:  [phone]  Call [phone] if you have any questions prior to your surgery date Monday-Friday 8am-4pm    Remember:  Do not eat or drink after midnight the night before your surgery - Tuesday   Take these medicines the morning of surgery with A SIP OF WATER: aspirin EC 81 MG clopidogrel (PLAVIX)  icosapent Ethyl (VASCEPA) isosorbide mononitrate (IMDUR)  metoprolol succinate (TOPROL XL)  Nitroglycerin if needed traMADol (ULTRAM) if needed valACYclovir (VALTREX)   As of today, STOP taking any Aspirin (unless otherwise instructed by your surgeon) Aleve, Naproxen, Ibuprofen, Motrin, Advil, Goody's, BC's, all herbal medications, fish oil, and all vitamins.                      Do not wear jewelry, make up, or nail polish            Do not wear lotions, powders, colognes, or deodorant.            Men may shave face and neck.            Do not bring valuables to the hospital.            [facility] is not responsible for any belongings or valuables.  Do NOT Smoke (Tobacco/Vaping) or drink Alcohol 24 hours prior to your procedure If you use a CPAP at night, you may bring all equipment for your overnight stay.   Contacts, glasses, dentures or bridgework may not be worn into surgery.      For patients admitted to the hospital, discharge time will be determined by your treatment team.   Patients discharged the day of surgery will not be allowed to drive home, and someone needs to stay with them for 24 hours.    Special instructions:   [facility]- Preparing For Surgery  Before surgery, you  can play an important role. Because skin is not sterile, your skin needs to be as free of germs as possible. You can reduce the number of germs on your skin by washing with CHG (chlorahexidine gluconate) Soap before surgery.  CHG is an antiseptic cleaner which kills germs and bonds with the skin to continue killing germs even after washing.    Oral Hygiene is also important to reduce your risk of infection.  Remember - BRUSH YOUR TEETH THE MORNING OF SURGERY WITH YOUR REGULAR TOOTHPASTE  Please do not use if you have an allergy to CHG or antibacterial soaps. If your skin becomes reddened/irritated stop using the CHG.  Do not shave (including legs and underarms) for at least 48 hours prior to first CHG shower. It is OK to shave your face.  Please follow these instructions carefully.   1. Shower the NIGHT BEFORE SURGERY-Tues and the MORNING OF [place] with CHG Soap.   2. If you chose to wash your hair, wash your hair first as usual with your normal shampoo.  3. After you shampoo, rinse your hair and body thoroughly to remove the shampoo.  4. Use CHG as you would any other liquid soap. You can apply CHG directly to the skin and wash gently  with a scrungie or a clean washcloth.   5. Apply the CHG Soap to your body ONLY FROM THE NECK DOWN.  Do not use on open wounds or open sores. Avoid contact with your eyes, ears, mouth and genitals (private parts). Wash Face and genitals (private parts)  with your normal soap.   6. Wash thoroughly, paying special attention to the area where your surgery will be performed.  7. Thoroughly rinse your body with warm water from the neck down.  8. DO NOT shower/wash with your normal soap after using and rinsing off the CHG Soap.  9. Pat yourself dry with a CLEAN TOWEL.  10. Wear CLEAN PAJAMAS to bed the night before surgery  11. Place CLEAN SHEETS on your bed the night of your first shower and DO NOT SLEEP WITH PETS.   Day of Surgery: Wear  Clean/Comfortable clothing the morning of surgery Do not apply any deodorants/lotions.   Remember to brush your teeth WITH YOUR REGULAR TOOTHPASTE.   Please read over the following fact sheets that you were given.

## 2020-04-23 ENCOUNTER — Other Ambulatory Visit (HOSPITAL_COMMUNITY)
Admission: RE | Admit: 2020-04-23 | Discharge: 2020-04-23 | Disposition: A | Discharge status: Home / Self Care | Payer: PPO | Source: Ambulatory Visit | Attending: Surgery | Admitting: Surgery

## 2020-04-23 DIAGNOSIS — Z01812 Encounter for preprocedural laboratory examination: Secondary | ICD-10-CM | POA: Diagnosis not present

## 2020-04-23 DIAGNOSIS — Z20822 Contact with and (suspected) exposure to covid-19: Secondary | ICD-10-CM | POA: Insufficient documentation

## 2020-04-23 LAB — SARS CORONAVIRUS 2 (TAT 6-24 HRS): SARS Coronavirus 2: NEGATIVE

## 2020-04-23 NOTE — Progress Notes (Addendum)
Anesthesia Chart Review:  Follows with cardiology for history of CAD s/p NSTEMI 01/2017 treated medically, chronic systolic and diastolic heart failure, hypertension, hyperlipidemia, carotid stenosis, R subclavian steal.  Last seen by Dr. Oval Linsey 08/12/2019, per note "[name] has obstructive CAD that we are medically managing. No recurrent angina since starting Imdur.  He remains very active and has no symptoms.  Continue aspirin, isosorbide, and metoprolol.  Lipids at goal on Repatha.  He did not tolerate statins or Zetia....LVEF 45-50% on LV gram.  Volume status is stable and he has no symptoms of heart failure.  Continue metoprolol succinate."  Patient was advised to follow-up with cardiology in 1 year."  Has been followed by vascular surgery for right carotid stenosis.  Recent ultrasound revealed high-grade asymptomatic right carotid stenosis and TCAR was recommended.  Preop labs reviewed, unremarkable.  EKG 08/12/2019: NSR.  Rate 70.  Right bundle branch block.  Carotid duplex 03/28/2020: Summary:  Right Carotid: Velocities in the right ICA are consistent with a 80-99% stenosis.  Left Carotid: Velocities in the left ICA are consistent with a 1-39% stenosis.  Vertebrals: Bilateral vertebral arteries demonstrate antegrade flow.  Subclavians: Right subclavian artery flow was disturbed. Normal flow hemodynamics were seen in the left subclavian artery.   Cath 01/26/2017:  Prox RCA to Mid RCA lesion, 100 %stenosed.  Ost LM lesion, 40 %stenosed.  Ost LAD lesion, 60 %stenosed.  1st Mrg lesion, 80 %stenosed.  There is mild to moderate left ventricular systolic dysfunction.  LV end diastolic pressure is mildly elevated.  The left ventricular ejection fraction is 45-50% by visual estimate.   IMPRESSION:[name] hasn't occluded dominant right which left right collaterals and a moderate ostial LAD stenosis. He does have a high-grade high first marginal branch but this is a relatively small  vessel in caliber. He looks like he has a completed RCA infarct with inferobasal hypokinesia. I recommend optimizing his medical therapy at this time. The sheath was removed and a TR band was placed on the right wrist to achieve patent hemostasis. The patient left the lab in stable condition.   Wynonia Musty [facility] Short Stay Center/Anesthesiology Phone [phone] 04/23/2020 2:18 PM

## 2020-04-23 NOTE — Anesthesia Preprocedure Evaluation (Addendum)
Anesthesia Evaluation  Patient identified by MRN, date of birth, ID band Patient awake    Reviewed: Allergy & Precautions, NPO status , Patient's Chart, lab work & pertinent test results  Airway Mallampati: II  TM Distance: >3 FB Neck ROM: Full    Dental  (+) Edentulous Upper, Edentulous Lower   Pulmonary neg pulmonary ROS, former smoker,    Pulmonary exam normal breath sounds clear to auscultation       Cardiovascular hypertension, Pt. on medications + angina + CAD, + Past MI and + Peripheral Vascular Disease  Normal cardiovascular exam Rhythm:Regular Rate:Normal  LHC 2018  Prox RCA to Mid RCA lesion, 100 %stenosed.  Ost LM lesion, 40 %stenosed.  Ost LAD lesion, 60 %stenosed.  1st Mrg lesion, 80 %stenosed.  There is mild to moderate left ventricular systolic dysfunction.  LV end diastolic pressure is mildly elevated.  The left ventricular ejection fraction is 45-50% by visual estimate.      Neuro/Psych  Headaches, TIA   GI/Hepatic Neg liver ROS, GERD  ,  Endo/Other  negative endocrine ROS  Renal/GU negative Renal ROS     Musculoskeletal  (+) Arthritis ,   Abdominal   Peds  Hematology negative hematology ROS (+)   Anesthesia Other Findings   Reproductive/Obstetrics                           Anesthesia Physical Anesthesia Plan  ASA: III  Anesthesia Plan: General   Post-op Pain Management:    Induction: Intravenous  PONV Risk Score and Plan: 3 and Ondansetron, Dexamethasone and Treatment may vary due to age or medical condition  Airway Management Planned: Oral ETT  Additional Equipment: Arterial line  Intra-op Plan:   Post-operative Plan: Extubation in OR  Informed Consent: I have reviewed the patients History and Physical, chart, labs and discussed the procedure including the risks, benefits and alternatives for the proposed anesthesia with the patient or authorized  representative who has indicated his/her understanding and acceptance.     Dental advisory given  Plan Discussed with: CRNA  Anesthesia Plan Comments: (PAT note by Karoline Caldwell, PA-C: Follows with cardiology for history of CAD s/p NSTEMI 01/2017 treated medically, chronic systolic and diastolic heart failure, hypertension, hyperlipidemia, carotid stenosis, R subclavian steal.  Last seen by Dr. Oval Linsey 08/12/2019, per note "Mr. Seelman has obstructive CAD that we are medically managing. No recurrent angina since starting Imdur.  He remains very active and has no symptoms.  Continue aspirin, isosorbide, and metoprolol.  Lipids at goal on Repatha.  He did not tolerate statins or Zetia....LVEF 45-50% on LV gram.  Volume status is stable and he has no symptoms of heart failure.  Continue metoprolol succinate."  Patient was advised to follow-up with cardiology in 1 year."  Has been followed by vascular surgery for right carotid stenosis.  Recent ultrasound revealed high-grade asymptomatic right carotid stenosis and TCAR was recommended.  Preop labs reviewed, unremarkable.  EKG 08/12/2019: NSR.  Rate 70.  Right bundle branch block.  Carotid duplex 03/28/2020: Summary:  Right Carotid: Velocities in the right ICA are consistent with a 80-99% stenosis.  Left Carotid: Velocities in the left ICA are consistent with a 1-39% stenosis.  Vertebrals: Bilateral vertebral arteries demonstrate antegrade flow.  Subclavians: Right subclavian artery flow was disturbed. Normal flow hemodynamics were seen in the left subclavian artery.   Cath 01/26/2017: Prox RCA to Mid RCA lesion, 100 %stenosed. Ost LM lesion, 40 %stenosed. Ost LAD  lesion, 60 %stenosed. 1st Mrg lesion, 80 %stenosed. There is mild to moderate left ventricular systolic dysfunction. LV end diastolic pressure is mildly elevated. The left ventricular ejection fraction is 45-50% by visual estimate.  IMPRESSION:Mr. Warmack hasn't occluded dominant right  which left right collaterals and a moderate ostial LAD stenosis. He does have a high-grade high first marginal branch but this is a relatively small vessel in caliber. He looks like he has a completed RCA infarct with inferobasal hypokinesia. I recommend optimizing his medical therapy at this time. The sheath was removed and a TR band was placed on the right wrist to achieve patent hemostasis. The patient left the lab in stable condition.  )      Anesthesia Quick Evaluation

## 2020-04-24 ENCOUNTER — Other Ambulatory Visit: Payer: Self-pay | Admitting: Physician Assistant

## 2020-04-24 DIAGNOSIS — I6523 Occlusion and stenosis of bilateral carotid arteries: Secondary | ICD-10-CM

## 2020-04-24 MED ORDER — ICOSAPENT ETHYL 1 G PO CAPS: 2.0000 g | ORAL_CAPSULE | Freq: Two times a day (BID) | ORAL | 3 refills | Status: DC

## 2020-04-25 ENCOUNTER — Other Ambulatory Visit: Payer: Self-pay

## 2020-04-25 ENCOUNTER — Inpatient Hospital Stay (HOSPITAL_COMMUNITY)
Admission: RE | Admit: 2020-04-25 | Discharge: 2020-04-26 | DRG: 036 | Disposition: A | Discharge status: Home / Self Care | Payer: No Typology Code available for payment source | Attending: Surgery | Admitting: Surgery

## 2020-04-25 ENCOUNTER — Inpatient Hospital Stay (HOSPITAL_COMMUNITY): Payer: No Typology Code available for payment source | Admitting: Anesthesiology

## 2020-04-25 ENCOUNTER — Encounter (HOSPITAL_COMMUNITY)
Admission: RE | Disposition: A | Discharge status: Home / Self Care | Payer: Self-pay | Source: Home / Self Care | Attending: Surgery

## 2020-04-25 ENCOUNTER — Inpatient Hospital Stay (HOSPITAL_COMMUNITY): Payer: No Typology Code available for payment source | Admitting: Physician Assistant

## 2020-04-25 ENCOUNTER — Encounter (HOSPITAL_COMMUNITY): Payer: Self-pay | Admitting: Surgery

## 2020-04-25 ENCOUNTER — Inpatient Hospital Stay (HOSPITAL_COMMUNITY): Payer: No Typology Code available for payment source

## 2020-04-25 DIAGNOSIS — I4510 Unspecified right bundle-branch block: Secondary | ICD-10-CM | POA: Diagnosis present

## 2020-04-25 DIAGNOSIS — Z8249 Family history of ischemic heart disease and other diseases of the circulatory system: Secondary | ICD-10-CM | POA: Diagnosis not present

## 2020-04-25 DIAGNOSIS — Z888 Allergy status to other drugs, medicaments and biological substances status: Secondary | ICD-10-CM | POA: Diagnosis not present

## 2020-04-25 DIAGNOSIS — Z8673 Personal history of transient ischemic attack (TIA), and cerebral infarction without residual deficits: Secondary | ICD-10-CM | POA: Diagnosis not present

## 2020-04-25 DIAGNOSIS — I440 Atrioventricular block, first degree: Secondary | ICD-10-CM | POA: Diagnosis not present

## 2020-04-25 DIAGNOSIS — Z79899 Other long term (current) drug therapy: Secondary | ICD-10-CM | POA: Diagnosis not present

## 2020-04-25 DIAGNOSIS — I739 Peripheral vascular disease, unspecified: Secondary | ICD-10-CM | POA: Diagnosis present

## 2020-04-25 DIAGNOSIS — M1990 Unspecified osteoarthritis, unspecified site: Secondary | ICD-10-CM | POA: Diagnosis present

## 2020-04-25 DIAGNOSIS — K30 Functional dyspepsia: Secondary | ICD-10-CM | POA: Diagnosis not present

## 2020-04-25 DIAGNOSIS — I2510 Atherosclerotic heart disease of native coronary artery without angina pectoris: Secondary | ICD-10-CM | POA: Diagnosis present

## 2020-04-25 DIAGNOSIS — R7302 Impaired glucose tolerance (oral): Secondary | ICD-10-CM | POA: Diagnosis present

## 2020-04-25 DIAGNOSIS — I9581 Postprocedural hypotension: Secondary | ICD-10-CM | POA: Diagnosis not present

## 2020-04-25 DIAGNOSIS — E785 Hyperlipidemia, unspecified: Secondary | ICD-10-CM | POA: Diagnosis present

## 2020-04-25 DIAGNOSIS — R001 Bradycardia, unspecified: Secondary | ICD-10-CM | POA: Diagnosis not present

## 2020-04-25 DIAGNOSIS — Z833 Family history of diabetes mellitus: Secondary | ICD-10-CM

## 2020-04-25 DIAGNOSIS — Z85828 Personal history of other malignant neoplasm of skin: Secondary | ICD-10-CM

## 2020-04-25 DIAGNOSIS — I6521 Occlusion and stenosis of right carotid artery: Secondary | ICD-10-CM | POA: Diagnosis not present

## 2020-04-25 DIAGNOSIS — K219 Gastro-esophageal reflux disease without esophagitis: Secondary | ICD-10-CM | POA: Diagnosis present

## 2020-04-25 DIAGNOSIS — Z800 Family history of malignant neoplasm of digestive organs: Secondary | ICD-10-CM | POA: Diagnosis not present

## 2020-04-25 DIAGNOSIS — Z885 Allergy status to narcotic agent status: Secondary | ICD-10-CM | POA: Diagnosis not present

## 2020-04-25 DIAGNOSIS — I6529 Occlusion and stenosis of unspecified carotid artery: Secondary | ICD-10-CM | POA: Diagnosis present

## 2020-04-25 DIAGNOSIS — Z7982 Long term (current) use of aspirin: Secondary | ICD-10-CM | POA: Diagnosis not present

## 2020-04-25 DIAGNOSIS — H9190 Unspecified hearing loss, unspecified ear: Secondary | ICD-10-CM | POA: Diagnosis present

## 2020-04-25 DIAGNOSIS — I10 Essential (primary) hypertension: Secondary | ICD-10-CM | POA: Diagnosis present

## 2020-04-25 DIAGNOSIS — I252 Old myocardial infarction: Secondary | ICD-10-CM | POA: Diagnosis not present

## 2020-04-25 DIAGNOSIS — N400 Enlarged prostate without lower urinary tract symptoms: Secondary | ICD-10-CM | POA: Diagnosis present

## 2020-04-25 DIAGNOSIS — Z87891 Personal history of nicotine dependence: Secondary | ICD-10-CM | POA: Diagnosis not present

## 2020-04-25 DIAGNOSIS — I6523 Occlusion and stenosis of bilateral carotid arteries: Secondary | ICD-10-CM | POA: Diagnosis present

## 2020-04-25 HISTORY — PX: ULTRASOUND GUIDANCE FOR VASCULAR ACCESS: SHX6516

## 2020-04-25 HISTORY — PX: TRANSCAROTID ARTERY REVASCULARIZATIONÂ: SHX6778

## 2020-04-25 LAB — TYPE AND SCREEN
ABO/RH(D): A POS
Antibody Screen: NEGATIVE

## 2020-04-25 LAB — POCT ACTIVATED CLOTTING TIME
Activated Clotting Time: 241 seconds
Activated Clotting Time: 246 seconds

## 2020-04-25 LAB — ABO/RH: ABO/RH(D): A POS

## 2020-04-25 SURGERY — TRANSCAROTID ARTERY REVASCULARIZATION (TCAR)
Anesthesia: General | Site: Neck | Laterality: Right

## 2020-04-25 MED ORDER — LOSARTAN POTASSIUM 25 MG PO TABS
25.0000 mg | ORAL_TABLET | Freq: Every day | ORAL | Status: DC
  Filled 2020-04-25: qty 1

## 2020-04-25 MED ORDER — SUGAMMADEX SODIUM 200 MG/2ML IV SOLN
INTRAVENOUS | Status: DC | PRN
  Administered 2020-04-25: 200 mg via INTRAVENOUS

## 2020-04-25 MED ORDER — ACETAMINOPHEN 650 MG RE SUPP: 325.0000 mg | RECTAL | Status: DC | PRN

## 2020-04-25 MED ORDER — LIDOCAINE 2% (20 MG/ML) 5 ML SYRINGE
INTRAMUSCULAR | Status: DC | PRN
  Administered 2020-04-25: 100 mg via INTRAVENOUS

## 2020-04-25 MED ORDER — DOCUSATE SODIUM 100 MG PO CAPS
100.0000 mg | ORAL_CAPSULE | Freq: Every day | ORAL | Status: DC
  Administered 2020-04-26: 100 mg via ORAL
  Filled 2020-04-25: qty 1

## 2020-04-25 MED ORDER — FENTANYL CITRATE (PF) 250 MCG/5ML IJ SOLN
INTRAMUSCULAR | Status: AC
  Filled 2020-04-25: qty 5

## 2020-04-25 MED ORDER — SODIUM CHLORIDE 0.9 % IV SOLN
INTRAVENOUS | Status: DC | PRN
  Administered 2020-04-25: 500 mL

## 2020-04-25 MED ORDER — SODIUM CHLORIDE 0.9 % IV SOLN: INTRAVENOUS | Status: DC

## 2020-04-25 MED ORDER — PHENYLEPHRINE 40 MCG/ML (10ML) SYRINGE FOR IV PUSH (FOR BLOOD PRESSURE SUPPORT)
PREFILLED_SYRINGE | INTRAVENOUS | Status: DC | PRN
  Administered 2020-04-25 (×2): 80 ug via INTRAVENOUS
  Administered 2020-04-25: 40 ug via INTRAVENOUS

## 2020-04-25 MED ORDER — CHLORHEXIDINE GLUCONATE CLOTH 2 % EX PADS: 6.0000 | MEDICATED_PAD | Freq: Once | CUTANEOUS | Status: DC

## 2020-04-25 MED ORDER — CHLORHEXIDINE GLUCONATE 0.12 % MT SOLN: 15.0000 mL | Freq: Once | OROMUCOSAL | Status: AC

## 2020-04-25 MED ORDER — FENTANYL CITRATE (PF) 250 MCG/5ML IJ SOLN
INTRAMUSCULAR | Status: DC | PRN
  Administered 2020-04-25: 100 ug via INTRAVENOUS

## 2020-04-25 MED ORDER — LABETALOL HCL 5 MG/ML IV SOLN: 10.0000 mg | INTRAVENOUS | Status: DC | PRN

## 2020-04-25 MED ORDER — ISOSORBIDE MONONITRATE ER 60 MG PO TB24
60.0000 mg | ORAL_TABLET | Freq: Every day | ORAL | Status: DC
  Filled 2020-04-25: qty 1

## 2020-04-25 MED ORDER — HEPARIN SODIUM (PORCINE) 1000 UNIT/ML IJ SOLN
INTRAMUSCULAR | Status: AC
  Filled 2020-04-25: qty 1

## 2020-04-25 MED ORDER — CEFAZOLIN SODIUM-DEXTROSE 2-4 GM/100ML-% IV SOLN
2.0000 g | Freq: Three times a day (TID) | INTRAVENOUS | Status: AC
  Administered 2020-04-25 (×2): 2 g via INTRAVENOUS
  Filled 2020-04-25 (×3): qty 100

## 2020-04-25 MED ORDER — PANTOPRAZOLE SODIUM 40 MG PO TBEC
40.0000 mg | DELAYED_RELEASE_TABLET | Freq: Every day | ORAL | Status: DC
  Administered 2020-04-25 – 2020-04-26 (×2): 40 mg via ORAL
  Filled 2020-04-25 (×2): qty 1

## 2020-04-25 MED ORDER — ROCURONIUM BROMIDE 10 MG/ML (PF) SYRINGE
PREFILLED_SYRINGE | INTRAVENOUS | Status: AC
  Filled 2020-04-25: qty 10

## 2020-04-25 MED ORDER — LIDOCAINE 2% (20 MG/ML) 5 ML SYRINGE
INTRAMUSCULAR | Status: AC
  Filled 2020-04-25: qty 5

## 2020-04-25 MED ORDER — HYDRALAZINE HCL 20 MG/ML IJ SOLN: 5.0000 mg | INTRAMUSCULAR | Status: DC | PRN

## 2020-04-25 MED ORDER — LACTATED RINGERS IV SOLN: INTRAVENOUS | Status: DC | PRN

## 2020-04-25 MED ORDER — ONDANSETRON HCL 4 MG/2ML IJ SOLN
INTRAMUSCULAR | Status: AC
  Filled 2020-04-25: qty 2

## 2020-04-25 MED ORDER — PROPOFOL 10 MG/ML IV BOLUS
INTRAVENOUS | Status: DC | PRN
  Administered 2020-04-25: 70 mg via INTRAVENOUS
  Administered 2020-04-25: 50 mg via INTRAVENOUS

## 2020-04-25 MED ORDER — SODIUM CHLORIDE 0.9 % IV BOLUS
500.0000 mL | Freq: Once | INTRAVENOUS | Status: AC
  Administered 2020-04-25: 500 mL via INTRAVENOUS

## 2020-04-25 MED ORDER — EPHEDRINE 5 MG/ML INJ
INTRAVENOUS | Status: AC
  Filled 2020-04-25: qty 10

## 2020-04-25 MED ORDER — CEFAZOLIN SODIUM-DEXTROSE 2-4 GM/100ML-% IV SOLN
2.0000 g | INTRAVENOUS | Status: AC
  Administered 2020-04-25: 2 g via INTRAVENOUS

## 2020-04-25 MED ORDER — TRAMADOL HCL 50 MG PO TABS
50.0000 mg | ORAL_TABLET | Freq: Four times a day (QID) | ORAL | Status: DC | PRN
  Administered 2020-04-26: 50 mg via ORAL
  Filled 2020-04-25: qty 1

## 2020-04-25 MED ORDER — DEXAMETHASONE SODIUM PHOSPHATE 10 MG/ML IJ SOLN
INTRAMUSCULAR | Status: DC | PRN
  Administered 2020-04-25: 10 mg via INTRAVENOUS

## 2020-04-25 MED ORDER — HEPARIN SODIUM (PORCINE) 1000 UNIT/ML IJ SOLN
INTRAMUSCULAR | Status: DC | PRN
  Administered 2020-04-25: 2000 [IU] via INTRAVENOUS
  Administered 2020-04-25: 10000 [IU] via INTRAVENOUS

## 2020-04-25 MED ORDER — CEFAZOLIN SODIUM-DEXTROSE 2-4 GM/100ML-% IV SOLN
INTRAVENOUS | Status: AC
  Filled 2020-04-25: qty 100

## 2020-04-25 MED ORDER — DEXAMETHASONE SODIUM PHOSPHATE 10 MG/ML IJ SOLN
INTRAMUSCULAR | Status: AC
  Filled 2020-04-25: qty 1

## 2020-04-25 MED ORDER — POTASSIUM CHLORIDE CRYS ER 20 MEQ PO TBCR: 20.0000 meq | EXTENDED_RELEASE_TABLET | Freq: Every day | ORAL | Status: DC | PRN

## 2020-04-25 MED ORDER — GUAIFENESIN-DM 100-10 MG/5ML PO SYRP
15.0000 mL | ORAL_SOLUTION | ORAL | Status: DC | PRN
  Administered 2020-04-26: 15 mL via ORAL
  Filled 2020-04-25: qty 15

## 2020-04-25 MED ORDER — GLYCOPYRROLATE PF 0.2 MG/ML IJ SOSY
PREFILLED_SYRINGE | INTRAMUSCULAR | Status: AC
  Filled 2020-04-25: qty 1

## 2020-04-25 MED ORDER — SALINE SPRAY 0.65 % NA SOLN
1.0000 | NASAL | Status: DC | PRN
  Administered 2020-04-25: 1 via NASAL
  Filled 2020-04-25: qty 44

## 2020-04-25 MED ORDER — ONDANSETRON HCL 4 MG/2ML IJ SOLN
INTRAMUSCULAR | Status: DC | PRN
  Administered 2020-04-25: 4 mg via INTRAVENOUS

## 2020-04-25 MED ORDER — SODIUM CHLORIDE 0.9 % IV SOLN: INTRAVENOUS | Status: AC

## 2020-04-25 MED ORDER — PHENOL 1.4 % MT LIQD: 1.0000 | OROMUCOSAL | Status: DC | PRN

## 2020-04-25 MED ORDER — ALUM & MAG HYDROXIDE-SIMETH 200-200-20 MG/5ML PO SUSP
15.0000 mL | ORAL | Status: DC | PRN
  Administered 2020-04-26: 30 mL via ORAL
  Filled 2020-04-25: qty 30

## 2020-04-25 MED ORDER — EPHEDRINE SULFATE-NACL 50-0.9 MG/10ML-% IV SOSY
PREFILLED_SYRINGE | INTRAVENOUS | Status: DC | PRN
  Administered 2020-04-25 (×2): 10 mg via INTRAVENOUS

## 2020-04-25 MED ORDER — IODIXANOL 320 MG/ML IV SOLN
INTRAVENOUS | Status: DC | PRN
  Administered 2020-04-25: 30 mL via INTRA_ARTERIAL

## 2020-04-25 MED ORDER — ONDANSETRON HCL 4 MG/2ML IJ SOLN: 4.0000 mg | Freq: Four times a day (QID) | INTRAMUSCULAR | Status: DC | PRN

## 2020-04-25 MED ORDER — METOPROLOL SUCCINATE ER 25 MG PO TB24
25.0000 mg | ORAL_TABLET | Freq: Every day | ORAL | Status: DC
  Filled 2020-04-25: qty 1

## 2020-04-25 MED ORDER — PROTAMINE SULFATE 10 MG/ML IV SOLN
INTRAVENOUS | Status: DC | PRN
  Administered 2020-04-25: 50 mg via INTRAVENOUS

## 2020-04-25 MED ORDER — ACETAMINOPHEN 325 MG PO TABS: 325.0000 mg | ORAL_TABLET | ORAL | Status: DC | PRN

## 2020-04-25 MED ORDER — MORPHINE SULFATE (PF) 2 MG/ML IV SOLN: 2.0000 mg | INTRAVENOUS | Status: DC | PRN

## 2020-04-25 MED ORDER — ROCURONIUM BROMIDE 10 MG/ML (PF) SYRINGE
PREFILLED_SYRINGE | INTRAVENOUS | Status: DC | PRN
  Administered 2020-04-25: 60 mg via INTRAVENOUS

## 2020-04-25 MED ORDER — CLOPIDOGREL BISULFATE 75 MG PO TABS
75.0000 mg | ORAL_TABLET | Freq: Every day | ORAL | Status: DC
  Administered 2020-04-26: 75 mg via ORAL
  Filled 2020-04-25: qty 1

## 2020-04-25 MED ORDER — PHENYLEPHRINE HCL-NACL 10-0.9 MG/250ML-% IV SOLN
INTRAVENOUS | Status: DC | PRN
  Administered 2020-04-25: 45 ug/min via INTRAVENOUS
  Administered 2020-04-25: 20 ug/min via INTRAVENOUS

## 2020-04-25 MED ORDER — ORAL CARE MOUTH RINSE: 15.0000 mL | Freq: Once | OROMUCOSAL | Status: AC

## 2020-04-25 MED ORDER — CHLORHEXIDINE GLUCONATE 0.12 % MT SOLN
OROMUCOSAL | Status: AC
  Administered 2020-04-25: 15 mL via OROMUCOSAL
  Filled 2020-04-25: qty 15

## 2020-04-25 MED ORDER — METOPROLOL TARTRATE 5 MG/5ML IV SOLN: 2.0000 mg | INTRAVENOUS | Status: DC | PRN

## 2020-04-25 MED ORDER — VALACYCLOVIR HCL 500 MG PO TABS
500.0000 mg | ORAL_TABLET | Freq: Every day | ORAL | Status: DC
  Administered 2020-04-26: 500 mg via ORAL
  Filled 2020-04-25: qty 1

## 2020-04-25 MED ORDER — PROPOFOL 10 MG/ML IV BOLUS
INTRAVENOUS | Status: AC
  Filled 2020-04-25: qty 40

## 2020-04-25 MED ORDER — HEMOSTATIC AGENTS (NO CHARGE) OPTIME
TOPICAL | Status: DC | PRN
  Administered 2020-04-25: 1 via TOPICAL

## 2020-04-25 MED ORDER — PHENYLEPHRINE 40 MCG/ML (10ML) SYRINGE FOR IV PUSH (FOR BLOOD PRESSURE SUPPORT)
PREFILLED_SYRINGE | INTRAVENOUS | Status: AC
  Filled 2020-04-25: qty 10

## 2020-04-25 MED ORDER — MAGNESIUM SULFATE 2 GM/50ML IV SOLN: 2.0000 g | Freq: Every day | INTRAVENOUS | Status: DC | PRN

## 2020-04-25 MED ORDER — SODIUM CHLORIDE 0.9 % IV SOLN
500.0000 mL | Freq: Once | INTRAVENOUS | Status: AC | PRN
  Administered 2020-04-25: 500 mL via INTRAVENOUS

## 2020-04-25 MED ORDER — ASPIRIN EC 81 MG PO TBEC
81.0000 mg | DELAYED_RELEASE_TABLET | Freq: Every day | ORAL | Status: DC
  Administered 2020-04-26: 81 mg via ORAL
  Filled 2020-04-25: qty 1

## 2020-04-25 MED ORDER — PROTAMINE SULFATE 10 MG/ML IV SOLN
INTRAVENOUS | Status: AC
  Filled 2020-04-25: qty 25

## 2020-04-25 MED ORDER — 0.9 % SODIUM CHLORIDE (POUR BTL) OPTIME
TOPICAL | Status: DC | PRN
  Administered 2020-04-25: 1000 mL

## 2020-04-25 MED ORDER — GLYCOPYRROLATE 0.2 MG/ML IJ SOLN
INTRAMUSCULAR | Status: DC | PRN
  Administered 2020-04-25: .2 mg via INTRAVENOUS

## 2020-04-25 SURGICAL SUPPLY — 57 items
BAG BANDED W/RUBBER/TAPE 36X54 (MISCELLANEOUS) ×3 IMPLANT
BALLN STERLING RX 5X20X80 (BALLOONS) ×3
BALLN STERLING RX 6X20X80 (BALLOONS) ×3
BALLOON STERLING RX 5X20X80 (BALLOONS) ×2 IMPLANT
BALLOON STERLING RX 6X20X80 (BALLOONS) ×2 IMPLANT
CANISTER SUCT 3000ML PPV (MISCELLANEOUS) ×3 IMPLANT
CATH BEACON 5 .035 40 KMP TP (CATHETERS) ×2 IMPLANT
CATH BEACON 5 .038 40 KMP TP (CATHETERS) ×1
CATH ROBINSON RED A/P 18FR (CATHETERS) IMPLANT
CATH SUCT 10FR WHISTLE TIP (CATHETERS) ×3 IMPLANT
CLIP VESOCCLUDE MED 6/CT (CLIP) ×3 IMPLANT
CLIP VESOCCLUDE SM WIDE 6/CT (CLIP) ×3 IMPLANT
COVER DOME SNAP 22 D (MISCELLANEOUS) ×3 IMPLANT
COVER PROBE W GEL 5X96 (DRAPES) ×3 IMPLANT
DERMABOND ADVANCED (GAUZE/BANDAGES/DRESSINGS) ×1
DERMABOND ADVANCED .7 DNX12 (GAUZE/BANDAGES/DRESSINGS) ×2 IMPLANT
DRAPE FEMORAL ANGIO 80X135IN (DRAPES) ×6 IMPLANT
DRAPE INCISE IOBAN 66X45 STRL (DRAPES) ×6 IMPLANT
ELECT REM PT RETURN 9FT ADLT (ELECTROSURGICAL) ×3
ELECTRODE REM PT RTRN 9FT ADLT (ELECTROSURGICAL) ×2 IMPLANT
GLOVE BIOGEL PI IND STRL 7.5 (GLOVE) ×2 IMPLANT
GLOVE BIOGEL PI INDICATOR 7.5 (GLOVE) ×1
GLOVE SURG SS PI 7.5 STRL IVOR (GLOVE) ×3 IMPLANT
GOWN STRL REUS W/ TWL LRG LVL3 (GOWN DISPOSABLE) ×4 IMPLANT
GOWN STRL REUS W/ TWL XL LVL3 (GOWN DISPOSABLE) ×2 IMPLANT
GOWN STRL REUS W/TWL LRG LVL3 (GOWN DISPOSABLE) ×2
GOWN STRL REUS W/TWL XL LVL3 (GOWN DISPOSABLE) ×1
GUIDEWIRE ENROUTE 0.014 (WIRE) ×3 IMPLANT
HEMOSTAT SNOW SURGICEL 2X4 (HEMOSTASIS) ×3 IMPLANT
INTRODUCER KIT GALT 7CM (INTRODUCER) ×1
KIT BASIN OR (CUSTOM PROCEDURE TRAY) ×3 IMPLANT
KIT ENCORE 26 ADVANTAGE (KITS) ×3 IMPLANT
KIT INTRODUCER GALT 7 (INTRODUCER) ×2 IMPLANT
KIT TURNOVER KIT B (KITS) ×3 IMPLANT
NEEDLE HYPO 25GX1X1/2 BEV (NEEDLE) IMPLANT
PACK CAROTID (CUSTOM PROCEDURE TRAY) ×3 IMPLANT
POSITIONER HEAD DONUT 9IN (MISCELLANEOUS) ×3 IMPLANT
SET MICROPUNCTURE 5F STIFF (MISCELLANEOUS) ×3 IMPLANT
SLEEVE SURGEON STRL (DRAPES) ×3 IMPLANT
STENT TRANSCAROTID SYSTEM 8X30 (Permanent Stent) ×3 IMPLANT
SUT MNCRL AB 4-0 PS2 18 (SUTURE) ×3 IMPLANT
SUT PROLENE 5 0 C 1 24 (SUTURE) ×6 IMPLANT
SUT PROLENE 6 0 BV (SUTURE) IMPLANT
SUT SILK 2 0 PERMA HAND 18 BK (SUTURE) ×3 IMPLANT
SUT SILK 2 0 SH (SUTURE) IMPLANT
SUT SILK 2 0 SH CR/8 (SUTURE) ×3 IMPLANT
SUT VIC AB 3-0 SH 27 (SUTURE) ×6
SUT VIC AB 3-0 SH 27X BRD (SUTURE) ×4 IMPLANT
SUT VIC AB 4-0 PS2 27 (SUTURE) ×3 IMPLANT
SYR 10ML LL (SYRINGE) ×9 IMPLANT
SYR 20ML LL LF (SYRINGE) ×3 IMPLANT
SYR CONTROL 10ML LL (SYRINGE) IMPLANT
SYSTEM TRANSCAROTID NEUROPRTCT (MISCELLANEOUS) ×2 IMPLANT
TOWEL GREEN STERILE (TOWEL DISPOSABLE) ×3 IMPLANT
TRANSCAROTID NEUROPROTECT SYS (MISCELLANEOUS) ×3
WATER STERILE IRR 1000ML POUR (IV SOLUTION) ×3 IMPLANT
WIRE BENTSON .035X145CM (WIRE) ×3 IMPLANT

## 2020-04-25 NOTE — Progress Notes (Signed)
Pt admitted to 4e from PACU. Pt oriented to room and staff. Telemetry box applied and CCMD notified x2 verifiers. Vitals stable. A-line zeroed. CHG bath done. Incisions clean and dry. No hematoma noted. Daughter at bedside.

## 2020-04-25 NOTE — Interval H&P Note (Signed)
History and Physical Interval Note:  04/25/2020 8:28 AM  Richard Burgess  has presented today for surgery, with the diagnosis of BILATERAL CAROTID ARTERY STENOSIS.  The various methods of treatment have been discussed with the patient and family. After consideration of risks, benefits and other options for treatment, the patient has consented to  Procedure(s): RIGHT TRANSCAROTID ARTERY REVASCULARIZATION (Right) as a surgical intervention.  The patient's history has been reviewed, patient examined, no change in status, stable for surgery.  I have reviewed the patient's chart and labs.  Questions were answered to the patient's satisfaction.     Annamarie Major

## 2020-04-25 NOTE — Op Note (Addendum)
Patient name: Richard Burgess MRN: [id number] DOB: September 20, 1928 Sex: male  04/25/2020 Pre-operative Diagnosis: Asymptomatic right carotid stenosis Post-operative diagnosis:  Same Surgeon:  Annamarie Major Assistants:  Servando Snare Procedure:   #1:  Right TCAR (Carotid stent)   #2:  U/s guided left femoral vein access   #3:  Flow reversal neuro protection Anesthesia:  General Blood Loss:  minimal Specimens:  none  Findings:  95% stenosis, less than 10% post stent  Pre-dilation balloon:  5x20 Post dilation balloon:  6x20 Stent:  ENROUTE 8x30 Contrast:  30 Dose Area:  1505.88 Flouro:  6.9 Procedure time:  55 min Stenosis:  95%  Indications:  86 year old with progressive right carotid stenosis which is asymptomatic.  He is high risk due t age and co-morbidities  Procedure:  The patient was identified in the holding area and taken to [place] 16  The patient was then placed supine on the table. general anesthesia was administered.  The patient was prepped and draped in the usual sterile fashion.  A time out was called and antibiotics were administered.  Dr. Donzetta Matters performed ultrasound access of the left femoral vein.  The left common femoral vein was evaluated with ultrasound and found widely patent and easily compressible.  A digital ultrasound image was acquired.  The vein was cannulated under ultrasound guidance with an 18-gauge needle.  A 035 wire was inserted without resistance.  The TCAR sheath was inserted.  Next ultrasound was used to identify the location of the right common carotid artery above the clavicle.  A transverse incision was made.  Cautery was used divide subcutaneous tissue as well as the platysma muscle.  The avascular plane between the sternal and clavicular heads of the sternocleidomastoid was identified and separated.  The carotid artery was then dissected free.  This was a healthy artery.  It was encircled with a Potts vessel loop and an umbilical tape.  The vagus  nerve was visualized and protected.  The patient was fully heparinized.  A 5-0 Prolene U stitch was placed in the adventitia of the common carotid artery.  The common carotid artery was then cannulated with a micropuncture needle.  A 018 wire was then inserted up to the mark on the wire.  A micropuncture sheath was placed.  A contrast injection was then performed which located the carotid bifurcation.  A carotid Amplatz wire was then inserted.  The micropuncture sheath was removed and the TCAR sheath was placed under fluoroscopic visualization.  This sheath was sutured into position with 2-0 silk.  At this point the flow reversal system was connected.  Passive flow reversal was confirmed with a saline injection.  A TCAR timeout was performed.  The ACT was 247.  2000 units of additional heparin was given.  Blood pressure and heart rate were in the appropriate range.  Active flow reversal was initiated and confirmed with a saline flush.  The image detector was rotated to a LAO projection and additional images were acquired to find the appropriate working length.  There was no dissection from sheath insertion.  I began using a short Kumpe catheter and an 018 wire.  With some difficulty, the internal carotid artery was cannulated and the wire was advanced.  An additional contrast injection was performed which confirmed that the wire was in the appropriate location.  Next, the stenosis was predilated using a 5 x 20 balloon.  There was minimal hemodynamic response.  The stent was then prepared on  the back table.  This was a ENROUTE 8 x 30.  It was advanced to the appropriate position and then deployed.  After deployment, there did appear to be some residual narrowing within the stent and so this was postdilated with a 6 x 20 balloon.  After waiting several minutes, a completion imaging was performed in different obliques which showed a widely patent internal carotid artery.  The wire was then removed.  Active flow  reversal was discontinued.  The TCAR sheath was then removed and the 5-0 Prolene suture was used to close the arteriotomy site.  50 mg of protamine was then given.  The carotid incision was irrigated and found to be hemostatic.  The sheath in the left groin was removed and manual pressure was held for hemostasis.  The neck incision was closed by reapproximating the platysma with 3-0 Vicryl and the skin with 3-0 Vicryl followed by Dermabond.  The patient was successfully extubated and found to be moving all 4 extremities to command.   Disposition:  To PACU stable and neuro intact   V. Annamarie Major, M.D., [facility] [place] Office: [phone] Pager:  [phone]

## 2020-04-25 NOTE — Progress Notes (Signed)
Pt's cuff BP 82/46 (56). A-line reading 91/40 (53). Pt asymptomatic. MD notified and verbal order received to administer 500 mL NS bolus x1.

## 2020-04-25 NOTE — Progress Notes (Signed)
Status post right TCAR. Patient seen and examined in the recovery room He is neurologically intact, moving all 4 extremities to command. Incisions are without hematoma. Patient is safe for transfer to 4 E.  Richard Burgess

## 2020-04-25 NOTE — Transfer of Care (Signed)
Immediate Anesthesia Transfer of Care Note  Patient: Richard Burgess  Procedure(s) Performed: RIGHT TRANSCAROTID ARTERY REVASCULARIZATION (Right Neck) ULTRASOUND GUIDANCE FOR VASCULAR ACCESS (Right Groin)  Patient Location: PACU  Anesthesia Type:General  Level of Consciousness: drowsy and patient cooperative  Airway & Oxygen Therapy: Patient Spontanous Breathing and Patient connected to face mask oxygen  Post-op Assessment: Report given to RN, Post -op Vital signs reviewed and stable, Patient moving all extremities, Patient moving all extremities X 4 and Patient able to stick tongue midline  Post vital signs: Reviewed and stable  Last Vitals:  Vitals Value Taken Time  BP    Temp    Pulse 111 04/25/20 1023  Resp 12 04/25/20 1023  SpO2 100 % 04/25/20 1023  Vitals shown include unvalidated device data.  Last Pain:  Vitals:   04/25/20 0653  TempSrc: Oral  PainSc:       Patients Stated Pain Goal: 3 (29/29/09 0301)  Complications: No complications documented.

## 2020-04-25 NOTE — Anesthesia Procedure Notes (Signed)
Arterial Line Insertion Start/End11/17/2021 8:05 AM, 04/25/2020 8:08 AM Performed by: Thelma Comp, CRNA, CRNA  Patient location: Pre-op. Preanesthetic checklist: patient identified, IV checked, site marked, risks and benefits discussed, surgical consent, monitors and equipment checked, pre-op evaluation, timeout performed and anesthesia consent Lidocaine 1% used for infiltration Left, radial was placed Catheter size: 20 Fr Hand hygiene performed , maximum sterile barriers used  and Seldinger technique used Allen's test indicative of satisfactory collateral circulation Attempts: 1 Procedure performed without using ultrasound guided technique. Following insertion, dressing applied. Post procedure assessment: normal and unchanged  Patient tolerated the procedure well with no immediate complications.

## 2020-04-25 NOTE — Anesthesia Procedure Notes (Signed)
Procedure Name: Intubation Date/Time: 04/25/2020 8:40 AM Performed by: Thelma Comp, CRNA Pre-anesthesia Checklist: Patient identified, Emergency Drugs available, Suction available and Patient being monitored Patient Re-evaluated:Patient Re-evaluated prior to induction Oxygen Delivery Method: Circle System Utilized Preoxygenation: Pre-oxygenation with 100% oxygen Induction Type: IV induction Ventilation: Mask ventilation without difficulty Laryngoscope Size: Mac and 4 Grade View: Grade I Tube type: Oral Tube size: 7.0 mm Number of attempts: 1 Airway Equipment and Method: Stylet and Oral airway Placement Confirmation: ETT inserted through vocal cords under direct vision,  positive ETCO2 and breath sounds checked- equal and bilateral Secured at: 22 cm Tube secured with: Tape Dental Injury: Teeth and Oropharynx as per pre-operative assessment

## 2020-04-26 ENCOUNTER — Telehealth: Payer: Self-pay

## 2020-04-26 ENCOUNTER — Encounter (HOSPITAL_COMMUNITY): Payer: Self-pay | Admitting: Surgery

## 2020-04-26 LAB — CBC
HCT: 32 % — ABNORMAL LOW (ref 39.0–52.0)
Hemoglobin: 10.9 g/dL — ABNORMAL LOW (ref 13.0–17.0)
MCH: 30.7 pg (ref 26.0–34.0)
MCHC: 34.1 g/dL (ref 30.0–36.0)
MCV: 90.1 fL (ref 80.0–100.0)
Platelets: 149 10*3/uL — ABNORMAL LOW (ref 150–400)
RBC: 3.55 MIL/uL — ABNORMAL LOW (ref 4.22–5.81)
RDW: 13.8 % (ref 11.5–15.5)
WBC: 10.1 10*3/uL (ref 4.0–10.5)
nRBC: 0 % (ref 0.0–0.2)

## 2020-04-26 LAB — BASIC METABOLIC PANEL
Anion gap: 11 (ref 5–15)
BUN: 26 mg/dL — ABNORMAL HIGH (ref 8–23)
CO2: 19 mmol/L — ABNORMAL LOW (ref 22–32)
Calcium: 8.6 mg/dL — ABNORMAL LOW (ref 8.9–10.3)
Chloride: 106 mmol/L (ref 98–111)
Creatinine, Ser: 1.22 mg/dL (ref 0.61–1.24)
GFR, Estimated: 57 mL/min — ABNORMAL LOW (ref >60)
Glucose, Bld: 178 mg/dL — ABNORMAL HIGH (ref 70–99)
Potassium: 3.9 mmol/L (ref 3.5–5.1)
Sodium: 136 mmol/L (ref 135–145)

## 2020-04-26 MED ORDER — TRAMADOL HCL 50 MG PO TABS: 50.0000 mg | ORAL_TABLET | Freq: Three times a day (TID) | ORAL | 0 refills | Status: DC | PRN
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
## 2020-04-26 NOTE — Progress Notes (Signed)
    Subjective  - POD #1, status post right TCAR  Soft blood pressure overnight requiring IV fluid (500 cc) Complaining of difficulty sleeping   Physical Exam:  Neurologically intact Incision is without hematoma       Assessment/Plan:  POD #1  The patient's blood pressure is improved this morning.  He is neurologically intact following his TCAR.  I think he is safe to go home.  He will need aspirin and Plavix as dual therapy for least 30 days and then he can transition to single agent antiplatelet therapy.  He will continue his statin.  I will see him back in the office in approximately 1 month with a duplex of his stent.  Wells Honorio Devol 04/26/2020 7:42 AM --  Vitals:   04/26/20 0400 04/26/20 0439  BP: (!) 104/56 (!) 107/55  Pulse: (!) 57 (!) 53  Resp: 14 17  Temp: 98.2 F (36.8 C)   SpO2: 94% 97%    Intake/Output Summary (Last 24 hours) at 04/26/2020 0742 Last data filed at 04/26/2020 0043 Gross per 24 hour  Intake 2770 ml  Output 500 ml  Net 2270 ml     Laboratory CBC    Component Value Date/Time   WBC 10.1 04/26/2020 0500   HGB 10.9 (L) 04/26/2020 0500   HCT 32.0 (L) 04/26/2020 0500   PLT 149 (L) 04/26/2020 0500    BMET    Component Value Date/Time   NA 136 04/26/2020 0500   NA 139 05/11/2017 1018   K 3.9 04/26/2020 0500   CL 106 04/26/2020 0500   CO2 19 (L) 04/26/2020 0500   GLUCOSE 178 (H) 04/26/2020 0500   BUN 26 (H) 04/26/2020 0500   BUN 14 05/11/2017 1018   CREATININE 1.22 04/26/2020 0500   CALCIUM 8.6 (L) 04/26/2020 0500   GFRNONAA 57 (L) 04/26/2020 0500   GFRAA 69 05/11/2017 1018    COAG Lab Results  Component Value Date   INR 1.0 04/20/2020   INR 0.97 04/10/2017   INR 1.01 01/25/2017   No results found for: PTT  Antibiotics Anti-infectives (From admission, onward)   Start     Dose/Rate Route Frequency Ordered Stop   04/26/20 1000  valACYclovir (VALTREX) tablet 500 mg        500 mg Oral Daily 04/25/20 1407     04/25/20  1630  ceFAZolin (ANCEF) IVPB 2g/100 mL premix        2 g 200 mL/hr over 30 Minutes Intravenous Every 8 hours 04/25/20 1407 04/26/20 0028   04/25/20 0631  ceFAZolin (ANCEF) 2-4 GM/100ML-% IVPB       Note to Pharmacy: Laurita Quint   : cabinet override      04/25/20 0631 04/25/20 0922   04/25/20 0627  ceFAZolin (ANCEF) IVPB 2g/100 mL premix        2 g 200 mL/hr over 30 Minutes Intravenous 30 min pre-op 04/25/20 0627 04/25/20 0911       V. Leia Alf, M.D., [facility] [place] Office: [phone] Pager:  [phone]

## 2020-04-26 NOTE — Progress Notes (Signed)
Pt discharged home with family.  All instructions given and reviewed, all questions answered.  Advised Pt to discuss BB use with cardiologist.  Has f/u appts in place.

## 2020-04-26 NOTE — Telephone Encounter (Signed)
**Note De-Identified Richard Burgess Obfuscation** I started a [employer] PA through covermymeds: Key: [id number]

## 2020-04-26 NOTE — Progress Notes (Addendum)
  Progress Note    04/26/2020 7:35 AM 1 Day Post-Op  Subjective:  States he had a lot of indigestion after eating last night. Said he ate too much. Did not sleep at all says that he was " seeing all sorts of different scenery". Otherwise he says he feels great. He is not having any pain. Wants to get up and wants to go home today   Vitals:   04/26/20 0400 04/26/20 0439  BP: (!) 104/56 (!) 107/55  Pulse: (!) 57 (!) 53  Resp: 14 17  Temp: 98.2 F (36.8 C)   SpO2: 94% 97%   Physical Exam: Cardiac: bradycardic  Lungs: non labored Incisions: right neck incision clean, dry and intact without swelling or hematoma. Soft and non tender. Left femoral access site clean, dry and intact without hematoma Extremities: Moving all extremities without deficits. Well perfused and warm Abdomen:  Soft, non tender Neurologic: alert and oriented. CN intact. Face symmetric. Tongue midline. Speech coherent  CBC    Component Value Date/Time   WBC 10.1 04/26/2020 0500   RBC 3.55 (L) 04/26/2020 0500   HGB 10.9 (L) 04/26/2020 0500   HCT 32.0 (L) 04/26/2020 0500   PLT 149 (L) 04/26/2020 0500   MCV 90.1 04/26/2020 0500   MCH 30.7 04/26/2020 0500   MCHC 34.1 04/26/2020 0500   RDW 13.8 04/26/2020 0500   LYMPHSABS 1.6 04/10/2017 1300   MONOABS 0.5 04/10/2017 1300   EOSABS 0.3 04/10/2017 1300   BASOSABS 0.0 04/10/2017 1300    BMET    Component Value Date/Time   NA 136 04/26/2020 0500   NA 139 05/11/2017 1018   K 3.9 04/26/2020 0500   CL 106 04/26/2020 0500   CO2 19 (L) 04/26/2020 0500   GLUCOSE 178 (H) 04/26/2020 0500   BUN 26 (H) 04/26/2020 0500   BUN 14 05/11/2017 1018   CREATININE 1.22 04/26/2020 0500   CALCIUM 8.6 (L) 04/26/2020 0500   GFRNONAA 57 (L) 04/26/2020 0500   GFRAA 69 05/11/2017 1018    INR    Component Value Date/Time   INR 1.0 04/20/2020 1042     Intake/Output Summary (Last 24 hours) at 04/26/2020 0735 Last data filed at 04/26/2020 0043 Gross per 24 hour  Intake  2770 ml  Output 500 ml  Net 2270 ml     Assessment/Plan:  86 y.o. male is s/p right TCAR 1 Day Post-Op. Doing well post op. Neurologically intact. Blood pressure remains soft. Having some sinus brady. Appears to have 1st degree AV block on EKG. Asymptomatic. Pain well controlled. Has not ambulated. Voiding without difficulty. Will have him ambulate this morning to see how he tolerates it. Likely will be okay for discharge this afternoon. He will go home on Aspirin, Plavix and Repatha. I have recommended that he follow up with his Cardiologist Dr. Oval Linsey at discharge. He is scheduled to have follow up with Dr. Trula Slade in 1 month  DVT prophylaxis:  Sq heparin   Karoline Caldwell, PA-C [facility] [phone] 04/26/2020 7:35 AM

## 2020-04-26 NOTE — Discharge Instructions (Signed)
   [facility] [place]  Discharge Instructions   Carotid Surgery  Please refer to the following instructions for your post-procedure care. Your surgeon or physician assistant will discuss any changes with you.  Activity  You are encouraged to walk as much as you can. You can slowly return to normal activities but must avoid strenuous activity and heavy lifting until your doctor tell you it's okay. Avoid activities such as vacuuming or swinging a golf club. You can drive after one week if you are comfortable and you are no longer taking prescription pain medications. It is normal to feel tired for serval weeks after your surgery. It is also normal to have difficulty with sleep habits, eating, and bowel movements after surgery. These will go away with time.  Bathing/Showering  Shower daily after you go home. Do not soak in a bathtub, hot tub, or swim until the incision heals completely.  Incision Care  Shower every day. Clean your incision with mild soap and water. Pat the area dry with a clean towel. You do not need a bandage unless otherwise instructed. Do not apply any ointments or creams to your incision. You may have skin glue on your incision. Do not peel it off. It will come off on its own in about one week. Your incision may feel thickened and raised for several weeks after your surgery. This is normal and the skin will soften over time.   For Men Only: It's okay to shave around the incision but do not shave the incision itself for 2 weeks. It is common to have numbness under your chin that could last for several months.  Diet  Resume your normal diet. There are no special food restrictions following this procedure. A low fat/low cholesterol diet is recommended for all patients with vascular disease. In order to heal from your surgery, it is CRITICAL to get adequate nutrition. Your body requires vitamins, minerals, and protein. Vegetables are the best source of  vitamins and minerals. Vegetables also provide the perfect balance of protein. Processed food has little nutritional value, so try to avoid this.  Medications  Resume taking all of your medications unless your doctor or physician assistant tells you not to. If your incision is causing pain, you may take over-the- counter pain relievers such as acetaminophen (Tylenol). If you were prescribed a stronger pain medication, please be aware these medications can cause nausea and constipation. Prevent nausea by taking the medication with a snack or meal. Avoid constipation by drinking plenty of fluids and eating foods with a high amount of fiber, such as fruits, vegetables, and grains.   Do not take Tylenol if you are taking prescription pain medications.  Follow Up  Our office will schedule a follow up appointment 2-3 weeks following discharge.  Please call us immediately for any of the following conditions  . Increased pain, redness, drainage (pus) from your incision site. . Fever of 101 degrees or higher. . If you should develop stroke (slurred speech, difficulty swallowing, weakness on one side of your body, loss of vision) you should call 911 and go to the nearest emergency room. .  Reduce your risk of vascular disease:  . Stop smoking. If you would like help call [facility] at 1-800-QUIT-NOW (1-800-784-8669) or [facility] at 336-586-4000. . Manage your cholesterol . Maintain a desired weight . Control your diabetes . Keep your blood pressure down .  If you have any questions, please call the office at [phone]. 

## 2020-04-26 NOTE — Progress Notes (Signed)
Richard Burgess had multiple runs of pauses since 6:30 am. Richard Burgess was at rest each time. EKG strip reviewed. Richard Burgess reveal sinus Brady at 60. Richard Burgess ambulated 800 feet with no issues. Richard Burgess stated he feels well. Vitals recheck stable. SR @ 80. Will continue to monitor. Jerald Kief, RN

## 2020-04-27 ENCOUNTER — Telehealth: Payer: Self-pay

## 2020-04-27 ENCOUNTER — Telehealth: Payer: Self-pay | Admitting: Cardiovascular Disease

## 2020-04-27 NOTE — Telephone Encounter (Signed)
Returned call to Richard Burgess (daughter) she states that at discharge the nurse told her that she does not think that pt should be taking metoprolol and she held it that morning and told her to call here to discuss. She states that while pt was in the hospital he was skipping beats too. Is this a problem? She states that they usually do not take pt BP. His BP now is 110/56 HR 52. Informed daughter that this is not too low but will forward to MD/APP for advise and let her know what to do over the weekend. She will continue to take and review BP over the weekend. Please advise.

## 2020-04-27 NOTE — Telephone Encounter (Signed)
Patient's wife called and stated that they need Dr. Oval Linsey or nurse to give them a call regarding medications that doctor told them to stop. Please call to discuss.

## 2020-04-27 NOTE — Telephone Encounter (Signed)
Patient recently underwent a right carotid stent by Dr. Trula Slade on 04/25/2020.  After surgery, he had hypotension and asymptomatic bradycardia in the low 50s.  Despite so, he was still discharged on metoprolol.  Talking with the patient's daughter, patient had a presyncopal episode after he got home yesterday from the hospital.  It does not appears to me that metoprolol was ordered during the recent hospitalization.  Blood pressure prior to discharge was 104/56.  He did take the metoprolol succinate this morning, systolic blood pressure in the 110s.  Heart rate in the low 50s.  Given the presyncopal episode for yesterday, I recommend to hold metoprolol until his follow-up appointment with Dr. Oval Linsey.

## 2020-04-27 NOTE — Anesthesia Postprocedure Evaluation (Signed)
Anesthesia Post Note  Patient: Richard Burgess  Procedure(s) Performed: RIGHT TRANSCAROTID ARTERY REVASCULARIZATION (Right Neck) ULTRASOUND GUIDANCE FOR VASCULAR ACCESS (Right Groin)     Patient location during evaluation: PACU Anesthesia Type: General Level of consciousness: sedated and patient cooperative Pain management: pain level controlled Vital Signs Assessment: post-procedure vital signs reviewed and stable Respiratory status: spontaneous breathing Cardiovascular status: stable Anesthetic complications: no   No complications documented.  Last Vitals:  Vitals:   04/26/20 0439 04/26/20 0916  BP: (!) 107/55 100/70  Pulse: (!) 53 (!) 56  Resp: 17 18  Temp:    SpO2: 97% 100%    Last Pain:  Vitals:   04/26/20 0745  TempSrc:   PainSc: 0-No pain                 Nolon Nations

## 2020-04-27 NOTE — Telephone Encounter (Signed)
Transition Care Management Unsuccessful Follow-up Telephone Call  Date of discharge and from where:  04/26/2020 from [facility]   Attempts:  1st Attempt  Reason for unsuccessful TCM follow-up call:  Left voice message

## 2020-04-30 ENCOUNTER — Encounter: Payer: Self-pay | Admitting: Cardiovascular Disease

## 2020-04-30 ENCOUNTER — Other Ambulatory Visit: Payer: Self-pay

## 2020-04-30 ENCOUNTER — Ambulatory Visit (INDEPENDENT_AMBULATORY_CARE_PROVIDER_SITE_OTHER): Payer: No Typology Code available for payment source | Admitting: Cardiovascular Disease

## 2020-04-30 VITALS — BP 120/62 | HR 55 | Ht 66.0 in | Wt 211.0 lb

## 2020-04-30 DIAGNOSIS — I712 Thoracic aortic aneurysm, without rupture, unspecified: Secondary | ICD-10-CM

## 2020-04-30 DIAGNOSIS — I10 Essential (primary) hypertension: Secondary | ICD-10-CM | POA: Diagnosis not present

## 2020-04-30 DIAGNOSIS — I6521 Occlusion and stenosis of right carotid artery: Secondary | ICD-10-CM | POA: Diagnosis not present

## 2020-04-30 DIAGNOSIS — I214 Non-ST elevation (NSTEMI) myocardial infarction: Secondary | ICD-10-CM | POA: Diagnosis not present

## 2020-04-30 NOTE — Progress Notes (Signed)
Cardiology Office Note   Date:  04/30/2020   ID:  Richard Burgess, DOB 10/04/1928, MRN [id number]  PCP:  Cassandria Anger, MD  Cardiologist:   [name], MD  Vascular: Dr. Trula Slade  No chief complaint on file.   History of Present Illness: Richard Burgess is an 86 y.o. male with chronic systolic and diastolic heart failure, hypertension, hyperlipidemia, aortic aneurysm, carotid stenosis s/p R carotid stent, R subclavian steal, who presents for follow up.  Richard Burgess presented to the ED with NSTEMI 01/2017.  He underwent left heart catheterization 01/2017 and was noted to have 3v CAD (100% RCA, 40% OM, 60% ostial LAD, and 80% OM1.  LVEF was 45-50%.  He had L-->R collaterals to the LAD.  Medical management was recommended.  He was started on clopidogrel with plans for 12 months of DAPT.  He had a history of statin intolerance.  He was started on Zetia in the hospital.  LDL was 94 he was referred to lipid clinic and prescribed Repatha.   He reported exertional angina and was started on isosorbide.    Richard Burgess continues to do well.  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  He has no exertional chest pain or shortness of breath.  He stays very active.  He has no lower extremity edema, orthopnea, or PND.  His only complaint is some pain in his left ankle due to an injury in the 1970s.  He had a couple episodes where he felt like he stopped breathing overnight.  He had a hard time going back to sleep.  He thinks he may been having a bad dream.  He has no daytime somnolence and feels well-rested when he gets up in the mornings.  He thinks that he snores mildly.  He has never had a sleep study and does not want one.  He underwent R carotid stenting on 04/2020.  He had some upset stomach and belching after the procedure.  He initially had some shortness of breath that has resolved.  While in the hospital he had a symptomatic bradycardia and first-degree AV block.  He was asked  to stop his metoprolol until follow-up here.  Overall he has been feeling well.  He has not taken any metoprolol and his heart rate is 55 bpm.  He denies any lightheadedness or dizziness.  He has been trying to get Vascepa but it has not been filled due to prior authorization issues.  He had labs at the [place] in October 2021 and his triglycerides were 299.  Total cholesterol was 114 with HDL 45 and LDL 9.  Past Medical History:  Diagnosis Date  . Allergy   . BPH (benign prostatic hypertrophy)   . Carotid artery occlusion   . Diverticulosis of colon   . ED (erectile dysfunction)   . GERD (gastroesophageal reflux disease)   . Glucose intolerance (impaired glucose tolerance)   . Hearing loss    no hearing aids  . History of pancreatitis   . HSV infection    left eye  . Hx-TIA (transient ischemic attack)   . Hyperlipidemia   . Hypertension   . Hypertention, malignant, with acute intensive management   . Low back pain   . Osteoarthritis   . PVD (peripheral vascular disease) ([place])    Bilateral carotid  . Tubular adenoma of colon 08/1991   One with carcinoma IN [facility] 1993, multiple adenomatous  . Wears partial dentures  upper    Past Surgical History:  Procedure Laterality Date  . APPENDECTOMY    . BASAL CELL CARCINOMA EXCISION     check and ear lobe  . BLEPHAROPLASTY Left    x 3  . CHOLECYSTECTOMY    . COLONOSCOPY    . ESOPHAGOGASTRODUODENOSCOPY    . LEFT HEART CATH AND CORONARY ANGIOGRAPHY N/A 01/26/2017   Procedure: LEFT HEART CATH AND CORONARY ANGIOGRAPHY;  Surgeon: Lorretta Harp, MD;  Location: [facility];  Service: Cardiovascular;  Laterality: N/A;  . LUMBAR SPINE SURGERY     X4  . TRANSCAROTID ARTERY REVASCULARIZATION Right 04/25/2020   Procedure: RIGHT TRANSCAROTID ARTERY REVASCULARIZATION;  Surgeon: Serafina Mitchell, MD;  Location: [facility];  Service: Vascular;  Laterality: Right;  . ULTRASOUND GUIDANCE FOR VASCULAR ACCESS Right 04/25/2020   Procedure:  ULTRASOUND GUIDANCE FOR VASCULAR ACCESS;  Surgeon: Serafina Mitchell, MD;  Location: [facility];  Service: Vascular;  Laterality: Right;     Current Outpatient Medications  Medication Sig Dispense Refill  . aspirin EC 81 MG tablet Take 81 mg by mouth daily.    . cholecalciferol (VITAMIN D) 1000 units tablet Take 1,000 Units by mouth daily with supper.     . clopidogrel (PLAVIX) 75 MG tablet Take 1 tablet (75 mg total) by mouth daily. (Patient taking differently: Take 75 mg by mouth daily. ) 30 tablet 11  . cyanocobalamin 500 MCG tablet Take 500 mcg by mouth daily. Vitamin B12    . docusate calcium (SURFAK) 240 MG capsule Take 240 mg by mouth at bedtime.    . Evolocumab (REPATHA SURECLICK) 161 MG/ML SOAJ Inject 140 mg into the skin every 14 (fourteen) days. 6 pen 3  . icosapent Ethyl (VASCEPA) 1 g capsule Take 2 capsules (2 g total) by mouth 2 (two) times daily. 120 capsule 3  . imiquimod (ALDARA) 5 % cream Apply topically daily. Apply to the right ear daily for the next 2 weeks.  Apply to the facial lesion daily for 4 weeks. 12 each 0  . isosorbide mononitrate (IMDUR) 60 MG 24 hr tablet Take 1 tablet (60 mg total) by mouth daily. 90 tablet 3  . losartan (COZAAR) 25 MG tablet TAKE 1 TABLET BY MOUTH EVERY DAY (Patient taking differently: Take 25 mg by mouth daily. ) 90 tablet 3  . Multiple Vitamins-Minerals (PRESERVISION AREDS 2) CAPS Take 1 capsule by mouth daily with supper.    . mupirocin ointment (BACTROBAN) 2 % On leg wound w/dressing change qd or bid 30 g 0  . nitroGLYCERIN (NITROSTAT) 0.4 MG SL tablet Place 1 tablet (0.4 mg total) under the tongue every 5 (five) minutes as needed for chest pain. 20 tablet 1  . Polyethyl Glycol-Propyl Glycol (SYSTANE OP) Place 1 drop into both eyes 4 (four) times daily as needed (dry eyes).    . traMADol (ULTRAM) 50 MG tablet Take 1 tablet (50 mg total) by mouth every 8 (eight) hours as needed for moderate pain (pain.). If adult CrCl > 30, max dose = 400 mg/day; if  CrCl < 30, max dose = 200 mg/day and q12h interval. 15 tablet 0  . triamcinolone ointment (KENALOG) 0.5 % Apply 1 application topically 2 (two) times daily. 30 g 0  . valACYclovir (VALTREX) 500 MG tablet Take 500 mg by mouth daily.     No current facility-administered medications for this visit.    Allergies:   Amlodipine, Livalo [pitavastatin], Oxycodone, Pravastatin sodium, Zetia [ezetimibe], Rosuvastatin, and Statins  Social History:  The patient  reports that he quit smoking about 51 years ago. His smoking use included cigarettes. He started smoking about 71 years ago. He has a 10.00 pack-year smoking history. He has never used smokeless tobacco. He reports current alcohol use of about 1.0 - 2.0 standard drink of alcohol per week. He reports that he does not use drugs.   Family History:  The patient's family history includes Colon cancer in his sister; Dementia in his father; Diabetes in his mother; Peripheral vascular disease in his brother.    ROS:  Please see the history of present illness.   Otherwise, review of systems are positive for none.   All other systems are reviewed and negative.    PHYSICAL EXAM: VS:  BP 120/62   Pulse (!) 55   Ht 5\' 6"  (1.676 m)   Wt 211 lb (95.7 kg)   SpO2 97%   BMI 34.06 kg/m  , BMI Body mass index is 34.06 kg/m. GENERAL:  Well appearing HEENT: Pupils equal round and reactive, fundi not visualized, oral mucosa unremarkable NECK:  No jugular venous distention, waveform within normal limits, carotid upstroke brisk and symmetric, no bruits LUNGS:  Clear to auscultation bilaterally HEART:  RRR.  PMI not displaced or sustained,S1 and S2 within normal limits, no S3, no S4, no clicks, no rubs, no murmurs ABD:  Flat, positive bowel sounds normal in frequency in pitch, no bruits, no rebound, no guarding, no midline pulsatile mass, no hepatomegaly, no splenomegaly EXT:  2 plus pulses throughout, no edema, no cyanosis no clubbing SKIN:  No rashes no  nodules NEURO:  Cranial nerves II through XII grossly intact, motor grossly intact throughout PSYCH:  Cognitively intact, oriented to person place and time   EKG:  EKG is ordered today. 12/04/17: Sinus rhythm.  Rate 60 bpm.  RBBB. 06/03/18: Sinus rhythm.  Rate 64 bpm.  Right bundle branch block. 08/12/19: Sinus rhythm.  Rate 70 bpm.  RBBB. 04/30/20: Sinus bradycardia.  Rate 55 bpm.  RBBB.   LEFT HEART CATH AND CORONARY ANGIOGRAPHY 01/26/17  Conclusion   Prox RCA to Mid RCA lesion, 100 %stenosed.  Ost LM lesion, 40 %stenosed.  Ost LAD lesion, 60 %stenosed.  1st Mrg lesion, 80 %stenosed.  There is mild to moderate left ventricular systolic dysfunction.  LV end diastolic pressure is mildly elevated.  The left ventricular ejection fraction is 45-50% by visual estimate.    Recent Labs: 04/20/2020: ALT 21 04/26/2020: BUN 26; Creatinine, Ser 1.22; Hemoglobin 10.9; Platelets 149; Potassium 3.9; Sodium 136    Lipid Panel    Component Value Date/Time   CHOL 110 04/25/2019 1010   TRIG 151 (H) 04/25/2019 1010   HDL 42 04/25/2019 1010   CHOLHDL 2.6 04/25/2019 1010   CHOLHDL 2 05/31/2018 0915   VLDL 27.2 05/31/2018 0915   LDLCALC 42 04/25/2019 1010   LDLDIRECT 153.0 02/18/2016 0826      Wt Readings from Last 3 Encounters:  04/30/20 211 lb (95.7 kg)  04/20/20 204 lb 11.2 oz (92.9 kg)  04/16/20 204 lb (92.5 kg)      ASSESSMENT AND PLAN:  # Obstructive CAD: # Hyperlipidemia:   Mr. Ewen has obstructive CAD that we are medically managing. No recurrent angina since starting Imur.  He remains very active and has no symptoms.  Continue aspirin, isosorbide, and stop metoprolol due to bradycardia.  LDL is at goal on Repatha.  He did not tolerate statins or Zetia.  Triglycerides have been elevated.  They were 299 at the [place] last month.  We are working on prior authorization for [employer].  He tried to get it through the [place] and had trouble.   # Chronic systolic and diastolic heart  failure:  LVEF 45-50% on LV gram.  Volume status is stable and he has no symptoms of heart failure.  Stopping metoprolol due to bradycardia.  Continue losartan.  # Asymptomatic bradycardia:   Noted in the hospital.  Metoprolol was held and his heart rate is 55 today.  We will continue to hold and take it off his medication list.  #C arotid stenosis: Status post carotid stenting with Dr. Trula Slade 04/2020.  He is doing well.  Continue aspirin, clopidogrel, Repatha, and helping to get Vascepa as above.    Current medicines are reviewed at length with the patient today.  The patient does not have concerns regarding medicines.  The following changes have been made:  None  Labs/ tests ordered today include:   No orders of the defined types were placed in this encounter.    Disposition:   FU with Ia Leeb C. Oval Linsey, MD, [facility] in 6 months    Signed, Sritha Chauncey C. Oval Linsey, [place], [facility]  04/30/2020 10:24 AM    [place]

## 2020-04-30 NOTE — Patient Instructions (Signed)
Medication Instructions:  Your physician recommends that you continue on your current medications as directed. Please refer to the Current Medication list given to you today.  *If you need a refill on your cardiac medications before your next appointment, please call your pharmacy*  Lab Work: NONE  Testing/Procedures: NONE  Follow-Up: At [facility], you and your health needs are our priority.  As part of our continuing mission to provide you with exceptional heart care, we have created designated Provider Care Teams.  These Care Teams include your primary Cardiologist (physician) and Advanced Practice Providers (APPs -  Physician Assistants and Nurse Practitioners) who all work together to provide you with the care you need, when you need it.  We recommend signing up for the patient portal called "MyChart".  Sign up information is provided on this After Visit Summary.  MyChart is used to connect with patients for Virtual Visits (Telemedicine).  Patients are able to view lab/test results, encounter notes, upcoming appointments, etc.  Non-urgent messages can be sent to your provider as well.   To learn more about what you can do with MyChart, go to [email or web address].    Your next appointment:   6 month(s)  The format for your next appointment:   In Person  Provider:   You may see Tiffany Rockwell, MD or one of the following Advanced Practice Providers on your designated Care Team:    Luke Kilroy, PA-C  Callie Goodrich, PA-C  Jesse Cleaver, FNP   

## 2020-05-07 ENCOUNTER — Inpatient Hospital Stay: Payer: PPO | Admitting: Internal Medicine

## 2020-05-09 ENCOUNTER — Encounter: Payer: PPO | Admitting: Internal Medicine

## 2020-05-17 ENCOUNTER — Other Ambulatory Visit: Payer: Self-pay | Admitting: *Deleted

## 2020-05-17 DIAGNOSIS — I6523 Occlusion and stenosis of bilateral carotid arteries: Secondary | ICD-10-CM

## 2020-05-22 ENCOUNTER — Encounter: Payer: PPO | Admitting: Internal Medicine

## 2020-05-28 ENCOUNTER — Ambulatory Visit (INDEPENDENT_AMBULATORY_CARE_PROVIDER_SITE_OTHER): Payer: PPO | Admitting: Surgery

## 2020-05-28 ENCOUNTER — Other Ambulatory Visit: Payer: Self-pay

## 2020-05-28 ENCOUNTER — Ambulatory Visit (HOSPITAL_COMMUNITY)
Admission: RE | Admit: 2020-05-28 | Discharge: 2020-05-28 | Disposition: A | Discharge status: Home / Self Care | Payer: No Typology Code available for payment source | Source: Ambulatory Visit | Attending: Surgery | Admitting: Surgery

## 2020-05-28 ENCOUNTER — Encounter: Payer: Self-pay | Admitting: Surgery

## 2020-05-28 ENCOUNTER — Telehealth: Payer: Self-pay

## 2020-05-28 VITALS — BP 138/79 | HR 88 | Temp 97.2°F | Resp 20 | Ht 66.0 in | Wt 211.0 lb

## 2020-05-28 DIAGNOSIS — I6523 Occlusion and stenosis of bilateral carotid arteries: Secondary | ICD-10-CM | POA: Diagnosis present

## 2020-05-28 DIAGNOSIS — I6521 Occlusion and stenosis of right carotid artery: Secondary | ICD-10-CM

## 2020-05-28 NOTE — Telephone Encounter (Signed)
Confirmed with patient daughter - per Dr. Trula Slade, patient may stop taking Plavix.

## 2020-05-28 NOTE — Progress Notes (Signed)
Patient name: Richard Burgess MRN: [id number] DOB: 07-08-1928 Sex: male  REASON FOR VISIT:     post op  HISTORY OF PRESENT ILLNESS:   Richard Burgess is a 86 y.o. male that I have been following for progressive asymptomatic right carotid stenosis.  This progressed to greater than 80% and so he underwent a right TCAR on 04/25/2020.  Intraoperative findings included a 95% stenosis.  He stayed an extra night in the hospital for bradycardia.  His metoprolol was discontinued.  He is back today for follow-up with no complaints or neurologic challenges.  Patient recommended for hypertension.  He has a statin allergy and is taking Repatha.  He has impaired glucose tolerance.  He remains very active  CURRENT MEDICATIONS:    Current Outpatient Medications  Medication Sig Dispense Refill  . aspirin EC 81 MG tablet Take 81 mg by mouth daily.    . cholecalciferol (VITAMIN D) 1000 units tablet Take 1,000 Units by mouth daily with supper.     . clopidogrel (PLAVIX) 75 MG tablet Take 1 tablet (75 mg total) by mouth daily. (Patient taking differently: Take 75 mg by mouth daily.) 30 tablet 11  . cyanocobalamin 500 MCG tablet Take 500 mcg by mouth daily. Vitamin B12    . docusate calcium (SURFAK) 240 MG capsule Take 240 mg by mouth at bedtime.    . Evolocumab (REPATHA SURECLICK) 017 MG/ML SOAJ Inject 140 mg into the skin every 14 (fourteen) days. 6 pen 3  . icosapent Ethyl (VASCEPA) 1 g capsule Take 2 capsules (2 g total) by mouth 2 (two) times daily. 120 capsule 3  . imiquimod (ALDARA) 5 % cream Apply topically daily. Apply to the right ear daily for the next 2 weeks.  Apply to the facial lesion daily for 4 weeks. 12 each 0  . isosorbide mononitrate (IMDUR) 60 MG 24 hr tablet Take 1 tablet (60 mg total) by mouth daily. 90 tablet 3  . losartan (COZAAR) 25 MG tablet TAKE 1 TABLET BY MOUTH EVERY DAY (Patient taking differently: Take 25 mg by mouth daily.) 90 tablet 3  .  Multiple Vitamins-Minerals (PRESERVISION AREDS 2) CAPS Take 1 capsule by mouth daily with supper.    . mupirocin ointment (BACTROBAN) 2 % On leg wound w/dressing change qd or bid 30 g 0  . nitroGLYCERIN (NITROSTAT) 0.4 MG SL tablet Place 1 tablet (0.4 mg total) under the tongue every 5 (five) minutes as needed for chest pain. 20 tablet 1  . Polyethyl Glycol-Propyl Glycol (SYSTANE OP) Place 1 drop into both eyes 4 (four) times daily as needed (dry eyes).    . traMADol (ULTRAM) 50 MG tablet Take 1 tablet (50 mg total) by mouth every 8 (eight) hours as needed for moderate pain (pain.). If adult CrCl > 30, max dose = 400 mg/day; if CrCl < 30, max dose = 200 mg/day and q12h interval. 15 tablet 0  . valACYclovir (VALTREX) 500 MG tablet Take 500 mg by mouth daily.     No current facility-administered medications for this visit.    REVIEW OF SYSTEMS:   [X]  denotes positive finding, [ ]  denotes negative finding Cardiac  Comments:  Chest pain or chest pressure:    Shortness of breath upon exertion:    Short of breath when lying flat:    Irregular heart rhythm:    Constitutional    Fever or chills:      PHYSICAL EXAM:   Vitals:   05/28/20 0845 05/28/20 0847  BP: 120/74 138/79  Pulse: 88   Resp: 20   Temp: (!) 97.2 F (36.2 C)   SpO2: 96%   Weight: 211 lb (95.7 kg)   Height: 5\' 6"  (1.676 m)     GENERAL: The patient is a well-nourished male, in no acute distress. The vital signs are documented above. CARDIOVASCULAR: There is a regular rate and rhythm. PULMONARY: Non-labored respirations Incision is well-healed  STUDIES:   Carotid duplex: Right Carotid: <50% ICA stent stenosis.   Left Carotid: Velocities in the left ICA are consistent with a 1-39%  stenosis.   Vertebrals: Bilateral vertebral arteries demonstrate antegrade flow.  Subclavians: Normal flow hemodynamics were seen in bilateral subclavian        arteries.    MEDICAL ISSUES:   Status post right-sided TCAR  for asymptomatic stenosis.  He is doing very well at this time.  He will return in 6 months for surveillance imaging.  Leia Alf, MD, FACS [facility] [phone] Pager [phone]

## 2020-05-29 ENCOUNTER — Other Ambulatory Visit: Payer: Self-pay

## 2020-05-29 DIAGNOSIS — I6523 Occlusion and stenosis of bilateral carotid arteries: Secondary | ICD-10-CM

## 2020-05-29 DIAGNOSIS — I6521 Occlusion and stenosis of right carotid artery: Secondary | ICD-10-CM

## 2020-05-30 ENCOUNTER — Ambulatory Visit: Payer: PPO | Admitting: Cardiology

## 2020-05-30 ENCOUNTER — Encounter: Payer: Self-pay | Admitting: *Deleted

## 2020-06-06 ENCOUNTER — Telehealth: Payer: Self-pay | Admitting: Cardiovascular Disease

## 2020-06-06 NOTE — Telephone Encounter (Signed)
New Message:   Please call, concerning prior authorization for Vascepa Please use this reference  Reference# [id number]

## 2020-06-06 NOTE — Telephone Encounter (Signed)
Called covermymeds spoke with Dylan- he advised that this PA would not go through that they were unable to match the patient with insurance provided. When we started talking he realized that the PA was placed as First name Caratachea, and Last name Brentt. These should be switched- he advised to have primary nurse change when she returns to office.  Will route to make her aware.  Thank you!

## 2020-06-15 ENCOUNTER — Other Ambulatory Visit: Payer: Self-pay

## 2020-06-15 ENCOUNTER — Ambulatory Visit (HOSPITAL_COMMUNITY)
Admission: RE | Admit: 2020-06-15 | Discharge: 2020-06-15 | Disposition: A | Discharge status: Home / Self Care | Payer: No Typology Code available for payment source | Source: Ambulatory Visit | Attending: Family | Admitting: Family

## 2020-06-15 ENCOUNTER — Telehealth (INDEPENDENT_AMBULATORY_CARE_PROVIDER_SITE_OTHER): Payer: No Typology Code available for payment source | Admitting: Family

## 2020-06-15 DIAGNOSIS — R059 Cough, unspecified: Secondary | ICD-10-CM | POA: Insufficient documentation

## 2020-06-15 DIAGNOSIS — R0602 Shortness of breath: Secondary | ICD-10-CM | POA: Diagnosis not present

## 2020-06-15 MED ORDER — BENZONATATE 100 MG PO CAPS: 100.0000 mg | ORAL_CAPSULE | Freq: Three times a day (TID) | ORAL | 0 refills | Status: DC | PRN

## 2020-06-15 MED ORDER — PREDNISONE 20 MG PO TABS: 20.0000 mg | ORAL_TABLET | Freq: Every day | ORAL | 0 refills | Status: DC
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
## 2020-06-21 ENCOUNTER — Other Ambulatory Visit: Payer: Self-pay | Admitting: Cardiovascular Disease

## 2020-06-22 NOTE — Telephone Encounter (Signed)
Patients appeal approved

## 2020-07-19 ENCOUNTER — Telehealth: Payer: Self-pay

## 2020-07-19 NOTE — Telephone Encounter (Signed)
Patients daughter want to send over some paperwork regarding help for patients medication. Please call back

## 2020-07-20 NOTE — Telephone Encounter (Signed)
Returned call to [place]. She was having issues with [employer]. Added pt to our system, looks like grant was reactivated last month. She didn't have the updated ID # though which I provided her with. Pharmacy already has active rx on file so she'll provide them with new ID # when pt is due for next Repatha fill.

## 2020-07-20 NOTE — Telephone Encounter (Signed)
**Note De-Identified [place] Obfuscation** The pts daughter [name] [facility]) is advised that I am forwarding this mess to our pharmacy team to call her back as she needs help with the pts Repatha application.

## 2020-07-24 ENCOUNTER — Other Ambulatory Visit: Payer: Self-pay | Admitting: Cardiovascular Disease

## 2020-08-10 ENCOUNTER — Telehealth: Payer: Self-pay | Admitting: Internal Medicine

## 2020-08-10 NOTE — Telephone Encounter (Addendum)
Patient's daughter dropped off a renewal parking placard for the patient.  Daughter, [name] request a call once completed to pick up.  Form has been completed and placed in providers box to review and sign.

## 2020-08-13 NOTE — Telephone Encounter (Signed)
Form has been signed, Copy sent to scan.   LVM for daughter to inform it is ready to be picked up.

## 2020-09-27 ENCOUNTER — Other Ambulatory Visit: Payer: Self-pay | Admitting: Cardiology

## 2020-10-05 LAB — LIPID PANEL
Cholesterol: 98 (ref 0–200)
HDL: 40 (ref 35–70)
LDL Cholesterol: 22
Triglycerides: 181 — AB (ref 40–160)

## 2020-10-05 LAB — BASIC METABOLIC PANEL
BUN: 14 (ref 4–21)
CO2: 25 — AB (ref 13–22)
Chloride: 106 (ref 99–108)
Creatinine: 1 (ref 0.6–1.3)
Glucose: 231
Potassium: 4.3 (ref 3.4–5.3)
Sodium: 136 — AB (ref 137–147)

## 2020-10-05 LAB — HEPATIC FUNCTION PANEL
ALT: 21 (ref 10–40)
AST: 14 (ref 14–40)
Alkaline Phosphatase: 90 (ref 25–125)
Bilirubin, Direct: 0.2 (ref 0.01–0.4)
Bilirubin, Total: 0.5

## 2020-10-05 LAB — COMPREHENSIVE METABOLIC PANEL
Albumin: 3.6 (ref 3.5–5.0)
Calcium: 9.3 (ref 8.7–10.7)
GFR calc non Af Amer: 70

## 2020-10-09 ENCOUNTER — Ambulatory Visit: Payer: PPO

## 2020-10-16 ENCOUNTER — Ambulatory Visit (INDEPENDENT_AMBULATORY_CARE_PROVIDER_SITE_OTHER): Payer: PPO

## 2020-10-16 DIAGNOSIS — Z0000 Encounter for general adult medical examination without abnormal findings: Secondary | ICD-10-CM

## 2020-10-16 NOTE — Patient Instructions (Signed)
Richard Burgess , Thank you for taking time to come for your Medicare Wellness Visit. I appreciate your ongoing commitment to your health goals. Please review the following plan we discussed and let me know if I can assist you in the future.   Screening recommendations/referrals: Colonoscopy: 08/15/2011; no longer needed due to age Recommended yearly ophthalmology/optometry visit for glaucoma screening and checkup Recommended yearly dental visit for hygiene and checkup  Vaccinations: Influenza vaccine: 03/27/2020 Pneumococcal vaccine: 08/02/2019, 07/20/2015 Tdap vaccine: 02/23/2008; overdue Shingles vaccine: got from [facility]   Covid-19: 07/19/2019, 08/16/2019  Advanced directives: Please bring a copy of your health care power of attorney and living will to the office at your convenience.  Conditions/risks identified: Yes; Reviewed health maintenance screenings with patient today and relevant education, vaccines, and/or referrals were provided. Please continue to do your personal lifestyle choices by: daily care of teeth and gums, regular physical activity (goal should be 5 days a week for 30 minutes), eat a healthy diet, avoid tobacco and drug use, limiting any alcohol intake, taking a low-dose aspirin (if not allergic or have been advised by your provider otherwise) and taking vitamins and minerals as recommended by your provider. Continue doing brain stimulating activities (puzzles, reading, adult coloring books, staying active) to keep memory sharp. Continue to eat heart healthy diet (full of fruits, vegetables, whole grains, lean protein, water--limit salt, fat, and sugar intake) and increase physical activity as tolerated.  Next appointment: Please schedule your next Medicare Wellness Visit with your Nurse Health Advisor in 1 year by calling [phone].  Preventive Care 85 Years and Older, Male Preventive care refers to lifestyle choices and visits with your health care provider that can  promote health and wellness. What does preventive care include?  A yearly physical exam. This is also called an annual well check.  Dental exams once or twice a year.  Routine eye exams. Ask your health care provider how often you should have your eyes checked.  Personal lifestyle choices, including:  Daily care of your teeth and gums.  Regular physical activity.  Eating a healthy diet.  Avoiding tobacco and drug use.  Limiting alcohol use.  Practicing safe sex.  Taking low doses of aspirin every day.  Taking vitamin and mineral supplements as recommended by your health care provider. What happens during an annual well check? The services and screenings done by your health care provider during your annual well check will depend on your age, overall health, lifestyle risk factors, and family history of disease. Counseling  Your health care provider may ask you questions about your:  Alcohol use.  Tobacco use.  Drug use.  Emotional well-being.  Home and relationship well-being.  Sexual activity.  Eating habits.  History of falls.  Memory and ability to understand (cognition).  Work and work Statistician. Screening  You may have the following tests or measurements:  Height, weight, and BMI.  Blood pressure.  Lipid and cholesterol levels. These may be checked every 5 years, or more frequently if you are over 67 years old.  Skin check.  Lung cancer screening. You may have this screening every year starting at age 75 if you have a 30-pack-year history of smoking and currently smoke or have quit within the past 15 years.  Fecal occult blood test (FOBT) of the stool. You may have this test every year starting at age 3.  Flexible sigmoidoscopy or colonoscopy. You may have a sigmoidoscopy every 5 years or a colonoscopy every 10 years starting at  age 47.  Prostate cancer screening. Recommendations will vary depending on your family history and other  risks.  Hepatitis C blood test.  Hepatitis B blood test.  Sexually transmitted disease (STD) testing.  Diabetes screening. This is done by checking your blood sugar (glucose) after you have not eaten for a while (fasting). You may have this done every 1-3 years.  Abdominal aortic aneurysm (AAA) screening. You may need this if you are a current or former smoker.  Osteoporosis. You may be screened starting at age 59 if you are at high risk. Talk with your health care provider about your test results, treatment options, and if necessary, the need for more tests. Vaccines  Your health care provider may recommend certain vaccines, such as:  Influenza vaccine. This is recommended every year.  Tetanus, diphtheria, and acellular pertussis (Tdap, Td) vaccine. You may need a Td booster every 10 years.  Zoster vaccine. You may need this after age 33.  Pneumococcal 13-valent conjugate (PCV13) vaccine. One dose is recommended after age 55.  Pneumococcal polysaccharide (PPSV23) vaccine. One dose is recommended after age 82. Talk to your health care provider about which screenings and vaccines you need and how often you need them. This information is not intended to replace advice given to you by your health care provider. Make sure you discuss any questions you have with your health care provider. Document Released: 06/22/2015 Document Revised: 02/13/2016 Document Reviewed: 03/27/2015 Elsevier Interactive Patient Education  2017 [employer] Prevention in the Home Falls can cause injuries. They can happen to people of all ages. There are many things you can do to make your home safe and to help prevent falls. What can I do on the outside of my home?  Regularly fix the edges of walkways and driveways and fix any cracks.  Remove anything that might make you trip as you walk through a door, such as a raised step or threshold.  Trim any bushes or trees on the path to your home.  Use  bright outdoor lighting.  Clear any walking paths of anything that might make someone trip, such as rocks or tools.  Regularly check to see if handrails are loose or broken. Make sure that both sides of any steps have handrails.  Any raised decks and porches should have guardrails on the edges.  Have any leaves, snow, or ice cleared regularly.  Use sand or salt on walking paths during winter.  Clean up any spills in your garage right away. This includes oil or grease spills. What can I do in the bathroom?  Use night lights.  Install grab bars by the toilet and in the tub and shower. Do not use towel bars as grab bars.  Use non-skid mats or decals in the tub or shower.  If you need to sit down in the shower, use a plastic, non-slip stool.  Keep the floor dry. Clean up any water that spills on the floor as soon as it happens.  Remove soap buildup in the tub or shower regularly.  Attach bath mats securely with double-sided non-slip rug tape.  Do not have throw rugs and other things on the floor that can make you trip. What can I do in the bedroom?  Use night lights.  Make sure that you have a light by your bed that is easy to reach.  Do not use any sheets or blankets that are too big for your bed. They should not hang down onto the  floor.  Have a firm chair that has side arms. You can use this for support while you get dressed.  Do not have throw rugs and other things on the floor that can make you trip. What can I do in the kitchen?  Clean up any spills right away.  Avoid walking on wet floors.  Keep items that you use a lot in easy-to-reach places.  If you need to reach something above you, use a strong step stool that has a grab bar.  Keep electrical cords out of the way.  Do not use floor polish or wax that makes floors slippery. If you must use wax, use non-skid floor wax.  Do not have throw rugs and other things on the floor that can make you trip. What can  I do with my stairs?  Do not leave any items on the stairs.  Make sure that there are handrails on both sides of the stairs and use them. Fix handrails that are broken or loose. Make sure that handrails are as long as the stairways.  Check any carpeting to make sure that it is firmly attached to the stairs. Fix any carpet that is loose or worn.  Avoid having throw rugs at the top or bottom of the stairs. If you do have throw rugs, attach them to the floor with carpet tape.  Make sure that you have a light switch at the top of the stairs and the bottom of the stairs. If you do not have them, ask someone to add them for you. What else can I do to help prevent falls?  Wear shoes that:  Do not have high heels.  Have rubber bottoms.  Are comfortable and fit you well.  Are closed at the toe. Do not wear sandals.  If you use a stepladder:  Make sure that it is fully opened. Do not climb a closed stepladder.  Make sure that both sides of the stepladder are locked into place.  Ask someone to hold it for you, if possible.  Clearly mark and make sure that you can see:  Any grab bars or handrails.  First and last steps.  Where the edge of each step is.  Use tools that help you move around (mobility aids) if they are needed. These include:  Canes.  Walkers.  Scooters.  Crutches.  Turn on the lights when you go into a dark area. Replace any light bulbs as soon as they burn out.  Set up your furniture so you have a clear path. Avoid moving your furniture around.  If any of your floors are uneven, fix them.  If there are any pets around you, be aware of where they are.  Review your medicines with your doctor. Some medicines can make you feel dizzy. This can increase your chance of falling. Ask your doctor what other things that you can do to help prevent falls. This information is not intended to replace advice given to you by your health care provider. Make sure you  discuss any questions you have with your health care provider. Document Released: 03/22/2009 Document Revised: 11/01/2015 Document Reviewed: 06/30/2014 Elsevier Interactive Patient Education  2017 [employer].

## 2020-10-16 NOTE — Progress Notes (Addendum)
I connected with Richard Burgess today by telephone and verified that I am speaking with the correct person using two identifiers. Location patient: home Location provider: work Persons participating in the virtual visit: Richard Burgess, Richard Burgess (daughter) and Richard Ames, LPN .   I discussed the limitations, risks, security and privacy concerns of performing an evaluation and management service by telephone and the availability of in person appointments. I also discussed with the patient that there may be a patient responsible charge related to this service. The patient expressed understanding and verbally consented to this telephonic visit.    Interactive audio and video telecommunications were attempted between this provider and patient, however failed, due to patient having technical difficulties OR patient did not have access to video capability.  We continued and completed visit with audio only.  Some vital signs may be absent or patient reported.   Time Spent with patient on telephone encounter: 30 minutes  Subjective:   Richard Burgess is a 87 y.o. male who presents for Medicare Annual/Subsequent preventive examination.  Review of Systems    No ROS. Medicare Wellness Visit. Additional risk factors are reflected in social history. Cardiac Risk Factors include: advanced age (>38men, >42 women);dyslipidemia;hypertension;male gender     Objective:    There were no vitals filed for this visit. There is no height or weight on file to calculate BMI.  Advanced Directives 10/16/2020 04/25/2020 04/20/2020 07/22/2018 04/10/2017 04/10/2017 01/25/2017  Does Patient Have a Medical Advance Directive? Yes No No No Yes No No  Type of Advance Directive Living will;Healthcare Power of Attorney - - - Living will - -  Does patient want to make changes to medical advance directive? No - Patient declined - - - No - Patient declined - -  Copy of [place] in Chart? No - copy  requested - - - - - -  Would patient like information on creating a medical advance directive? - No - Patient declined No - Patient declined No - Patient declined - - No - Patient declined  Pre-existing out of facility DNR order (yellow form or pink MOST form) - - - - - - -    Current Medications (verified) Outpatient Encounter Medications as of 10/16/2020  Medication Sig   aspirin EC 81 MG tablet Take 81 mg by mouth daily.   cholecalciferol (VITAMIN D) 1000 units tablet Take 1,000 Units by mouth daily with supper.    clopidogrel (PLAVIX) 75 MG tablet Take 1 tablet (75 mg total) by mouth daily. (Patient taking differently: Take 75 mg by mouth daily.)   cyanocobalamin 500 MCG tablet Take 500 mcg by mouth daily. Vitamin B12   docusate calcium (SURFAK) 240 MG capsule Take 240 mg by mouth at bedtime.   icosapent Ethyl (VASCEPA) 1 g capsule Take 2 capsules (2 g total) by mouth 2 (two) times daily.   imiquimod (ALDARA) 5 % cream Apply topically daily. Apply to the right ear daily for the next 2 weeks.  Apply to the facial lesion daily for 4 weeks.   isosorbide mononitrate (IMDUR) 60 MG 24 hr tablet Take 1 tablet (60 mg total) by mouth daily.   losartan (COZAAR) 25 MG tablet TAKE 1 TABLET BY MOUTH DAILY   Multiple Vitamins-Minerals (PRESERVISION AREDS 2) CAPS Take 1 capsule by mouth daily with supper.   mupirocin ointment (BACTROBAN) 2 % On leg wound w/dressing change qd or bid   nitroGLYCERIN (NITROSTAT) 0.4 MG SL tablet Place 1 tablet (0.4 mg  total) under the tongue every 5 (five) minutes as needed for chest pain.   Polyethyl Glycol-Propyl Glycol (SYSTANE OP) Place 1 drop into both eyes 4 (four) times daily as needed (dry eyes).   REPATHA 140 MG/ML SOSY INECT EVERY 14 DAYS DIRECTED   REPATHA SURECLICK 086 MG/ML SOAJ INJECT 140 MG INTO THE SKIN EVERY 14 (FOURTEEN) DAYS.   traMADol (ULTRAM) 50 MG tablet Take 1 tablet (50 mg total) by mouth every 8 (eight) hours as needed for moderate pain (pain.). If  adult CrCl > 30, max dose = 400 mg/day; if CrCl < 30, max dose = 200 mg/day and q12h interval.   valACYclovir (VALTREX) 500 MG tablet Take 500 mg by mouth daily.   No facility-administered encounter medications on file as of 10/16/2020.    Allergies (verified) Amlodipine, Livalo [pitavastatin], Oxycodone, Pravastatin sodium, Zetia [ezetimibe], Rosuvastatin, and Statins   History: Past Medical History:  Diagnosis Date   Allergy    BPH (benign prostatic hypertrophy)    Carotid artery occlusion    Diverticulosis of colon    ED (erectile dysfunction)    GERD (gastroesophageal reflux disease)    Glucose intolerance (impaired glucose tolerance)    Hearing loss    no hearing aids   History of pancreatitis    HSV infection    left eye   Hx-TIA (transient ischemic attack)    Hyperlipidemia    Hypertension    Hypertention, malignant, with acute intensive management    Low back pain    Osteoarthritis    PVD (peripheral vascular disease) ([place])    Bilateral carotid   Tubular adenoma of colon 08/1991   One with carcinoma IN [facility] 1993, multiple adenomatous   Wears partial dentures    upper   Past Surgical History:  Procedure Laterality Date   APPENDECTOMY     BASAL CELL CARCINOMA EXCISION     check and ear lobe   BLEPHAROPLASTY Left    x 3   CHOLECYSTECTOMY     COLONOSCOPY     ESOPHAGOGASTRODUODENOSCOPY     LEFT HEART CATH AND CORONARY ANGIOGRAPHY N/A 01/26/2017   Procedure: LEFT HEART CATH AND CORONARY ANGIOGRAPHY;  Surgeon: Lorretta Harp, MD;  Location: [facility];  Service: Cardiovascular;  Laterality: N/A;   LUMBAR SPINE SURGERY     X4   TRANSCAROTID ARTERY REVASCULARIZATION  Right 04/25/2020   Procedure: RIGHT TRANSCAROTID ARTERY REVASCULARIZATION;  Surgeon: Serafina Mitchell, MD;  Location[facility];  Service: Vascular;  Laterality: Right;   ULTRASOUND GUIDANCE FOR VASCULAR ACCESS Right 04/25/2020   Procedure: ULTRASOUND GUIDANCE FOR VASCULAR ACCESS;  Surgeon:  Serafina Mitchell, MD;  Location: [facility];  Service: Vascular;  Laterality: Right;   Family History  Problem Relation Age of Onset   Dementia Father    Diabetes Mother    Peripheral vascular disease Brother    Colon cancer Sister        dx in her 46's   Social History   Socioeconomic History   Marital status: Married    Spouse name: Not on file   Number of children: 3   Years of education: Not on file   Highest education level: Not on file  Occupational History   Occupation: Retired-Construction  Tobacco Use   Smoking status: Former Smoker    Packs/day: 0.50    Years: 20.00    Pack years: 10.00    Types: Cigarettes    Start date: 1950    Quit date: 1970  Years since quitting: 52.3   Smokeless tobacco: Never Used   Tobacco comment: Daily caffeine use, regular exercise  Vaping Use   Vaping Use: Never used  Substance and Sexual Activity   Alcohol use: Yes    Alcohol/week: 1.0 - 2.0 standard drink    Types: 1 - 2 Cans of beer per week   Drug use: No   Sexual activity: Yes  Other Topics Concern   Not on file  Social History Narrative   Not on file   Social Determinants of Health   Financial Resource Strain: Low Risk    Difficulty of Paying Living Expenses: Not hard at all  Food Insecurity: No Food Insecurity   Worried About Programme researcher, broadcasting/film/video in the Last Year: Never true   Ran Out of Food in the Last Year: Never true  Transportation Needs: No Transportation Needs   Lack of Transportation (Medical): No   Lack of Transportation (Non-Medical): No  Physical Activity: Sufficiently Active   Days of Exercise per Week: 5 days   Minutes of Exercise per Session: 30 min  Stress: No Stress Concern Present   Feeling of Stress : Not at all  Social Connections: Socially Integrated   Frequency of Communication with Friends and Family: More than three times a week   Frequency of Social Gatherings with Friends and Family: More than three times a week   Attends Religious  Services: More than 4 times per year   Active Member of [employer] or Organizations: No   Attends Engineer, structural: More than 4 times per year   Marital Status: Married    Tobacco Counseling Counseling given: Not Answered Comment: Daily caffeine use, regular exercise   Clinical Intake:  Pre-visit preparation completed: Yes  Pain : No/denies pain     Nutritional Risks: None Diabetes: No  How often do you need to have someone help you when you read instructions, pamphlets, or other written materials from your doctor or pharmacy?: 1 - Never What is the last grade level you completed in school?: 9th grade  Diabetic? no  Interpreter Needed?: No  Information entered by :: Susie Cassette, LPN   Activities of Daily Living In your present state of health, do you have any difficulty performing the following activities: 10/16/2020 04/20/2020  Hearing? Y Y  Comment - hearing loss - no hearing aids  Vision? N N  Difficulty concentrating or making decisions? N N  Walking or climbing stairs? Y Y  Comment - goes up slowly  Dressing or bathing? N N  Doing errands, shopping? N N  Preparing Food and eating ? N -  Using the Toilet? N -  In the past six months, have you accidently leaked urine? N -  Do you have problems with loss of bowel control? N -  Managing your Medications? N -  Managing your Finances? N -  Housekeeping or managing your Housekeeping? N -  Some recent data might be hidden    Patient Care Team: Plotnikov, Georgina Quint, MD as PCP - General Chilton Si, MD as PCP - Cardiology (Cardiology) Chilton Si, MD as Attending Physician (Cardiology)  Indicate any recent Medical Services you may have received from other than [facility] providers in the past year (date may be approximate).     Assessment:   This is a routine wellness examination for Richard Burgess.  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 Eats 3 meals a day;  Discussed need to fup on right knee; which to hurt more and can impair his active lifestyle.       Depression Screen PHQ 2/9 Scores 10/16/2020 01/27/2018 12/11/2016 11/21/2015 10/30/2014 10/30/2014 07/31/2014  PHQ - 2 Score 0 0 0 0 0 0 0  PHQ- 9 Score - - 0 - - - -    Fall Risk Fall Risk  10/16/2020 05/04/2019 01/27/2018 12/11/2016 11/21/2015  Falls in the past year? 0 0 No No No  Comment - Emmi Telephone Survey: data to providers prior to load - - -  Number falls in past yr: 0 - - - -  Injury with Fall? 0 - - - -  Risk for fall due to : No Fall Risks - - - -  Follow up Falls evaluation completed - - - -    FALL RISK PREVENTION PERTAINING TO THE HOME:  Any stairs in or around the home? No  If so, are there any without handrails? No  Home free of loose throw rugs in walkways, pet beds, electrical cords, etc? Yes  Adequate lighting in your home to reduce risk of falls? Yes   ASSISTIVE DEVICES UTILIZED TO PREVENT FALLS:  Life alert? No  Use of a cane, walker or w/c? No  Grab bars in the bathroom? Yes  Shower chair or bench in shower? Yes  Elevated toilet seat or a handicapped toilet? Yes   TIMED UP AND GO:  Was the test performed? No .  Length of time to ambulate 10 feet: 0 sec.   Gait steady and fast without use of assistive device  Cognitive Function:   MMSE - Mini Mental State Exam 10/30/2014  Not completed: Unable to complete        Immunizations Immunization History  Administered  Date(s) Administered   Influenza, High Dose Seasonal PF 02/18/2016   Influenza-Unspecified 07/11/2015, 03/10/2018   Pneumococcal Conjugate-13 08/01/2013   Td 02/23/2008    TDAP status: Due, Education has been provided regarding the importance of this vaccine. Advised may receive this vaccine at local pharmacy or Health Dept. Aware to provide a copy of the vaccination record if obtained from local pharmacy or Health Dept. Verbalized acceptance and understanding.  Flu Vaccine status: Up to date  Pneumococcal vaccine status: Up to date  Covid-19 vaccine status: Completed vaccines  Qualifies for Shingles Vaccine? Yes   Zostavax completed No   Shingrix Completed?: Yes  Screening Tests Health Maintenance  Topic Date Due   COVID-19 Vaccine (3 - Moderna risk 4-dose series) 09/13/2019   TETANUS/TDAP  11/07/2020   INFLUENZA VACCINE  01/07/2021   PNA vac Low Risk Adult  Completed   HPV VACCINES  Aged Out    Health Maintenance  Health Maintenance Due  Topic Date Due   COVID-19 Vaccine (3 - Moderna risk 4-dose series) 09/13/2019    Colorectal cancer screening: No longer required.   Lung Cancer Screening: (Low Dose CT Chest recommended if Age 71-80 years, 30 pack-year currently smoking OR have quit w/in 15years.) does not qualify.  Lung Cancer Screening Referral: no  Additional Screening:  Hepatitis C Screening: does not qualify; Completed no  Vision Screening: Recommended annual ophthalmology exams for early detection of glaucoma and other disorders of the eye. Is the patient up to date with their annual eye exam?  Yes  Who is the provider or what is the name of the office in which the patient attends annual eye exams? [facility] If pt is not established with a provider, would they like to be referred to a provider to establish care? No .   Dental Screening: Recommended annual dental exams for proper oral hygiene  Community Resource Referral / Chronic Care  Management: CRR required this visit?  No   CCM required this visit?  No      Plan:     I have personally reviewed and noted the following in the patient's chart:   Medical and social history Use of alcohol, tobacco or illicit drugs  Current medications and supplements including opioid prescriptions. Patient is not currently taking opioid prescriptions. Functional ability and status Nutritional status Physical activity Advanced directives List of other physicians Hospitalizations, surgeries, and ER visits in previous 12 months Vitals Screenings to include cognitive, depression, and falls Referrals and appointments  In addition, I have reviewed and discussed with patient certain preventive protocols, quality metrics, and best practice recommendations. A written personalized care plan for preventive services as well as general preventive health recommendations were provided to patient.     Sheral Flow, LPN   10/01/9561   Nurse Notes:  Patient is cogitatively intact. There were no vitals filed for this visit. There is no height or weight on file to calculate BMI.  Medical screening examination/treatment/procedure(s) were performed by non-physician practitioner and as supervising physician I was immediately available for consultation/collaboration.  I agree with above. Lew Dawes, MD

## 2020-11-26 ENCOUNTER — Other Ambulatory Visit: Payer: Self-pay

## 2020-11-26 ENCOUNTER — Ambulatory Visit: Payer: PPO | Admitting: Physician Assistant

## 2020-11-26 ENCOUNTER — Encounter: Payer: Self-pay | Admitting: Physician Assistant

## 2020-11-26 ENCOUNTER — Ambulatory Visit (HOSPITAL_COMMUNITY)
Admission: RE | Admit: 2020-11-26 | Discharge: 2020-11-26 | Disposition: A | Discharge status: Home / Self Care | Payer: PPO | Source: Ambulatory Visit | Attending: Surgery | Admitting: Surgery

## 2020-11-26 VITALS — BP 135/66 | HR 76 | Temp 98.1°F | Resp 18 | Ht 71.0 in | Wt 200.0 lb

## 2020-11-26 DIAGNOSIS — I6523 Occlusion and stenosis of bilateral carotid arteries: Secondary | ICD-10-CM | POA: Diagnosis not present

## 2020-11-26 NOTE — Progress Notes (Signed)
Office Note     CC:  follow up Requesting Provider:  Plotnikov, Evie Lacks, MD  HPI: Richard Burgess is a 87 y.o. (September 03, 1928) male who presents to go over studies related to carotid artery stenosis.  He is status post TCAR right sided by Dr. Trula Slade in November 2021.  This was performed due to asymptomatic high-grade stenosis.  He remained inpatient for an additional night due to postoperative bradycardia.  He denies any strokelike symptoms since last office visit.  He is taking an aspirin daily.  He has statin intolerance.  He is a former smoker.   Past Medical History:  Diagnosis Date   Allergy    BPH (benign prostatic hypertrophy)    Carotid artery occlusion    Diverticulosis of colon    ED (erectile dysfunction)    GERD (gastroesophageal reflux disease)    Glucose intolerance (impaired glucose tolerance)    Hearing loss    no hearing aids   History of pancreatitis    HSV infection    left eye   Hx-TIA (transient ischemic attack)    Hyperlipidemia    Hypertension    Hypertention, malignant, with acute intensive management    Low back pain    Osteoarthritis    PVD (peripheral vascular disease) ([place])    Bilateral carotid   Tubular adenoma of colon 08/1991   One with carcinoma IN [facility] 1993, multiple adenomatous   Wears partial dentures    upper    Past Surgical History:  Procedure Laterality Date   APPENDECTOMY     BASAL CELL CARCINOMA EXCISION     check and ear lobe   BLEPHAROPLASTY Left    x 3   CHOLECYSTECTOMY     COLONOSCOPY     ESOPHAGOGASTRODUODENOSCOPY     LEFT HEART CATH AND CORONARY ANGIOGRAPHY N/A 01/26/2017   Procedure: LEFT HEART CATH AND CORONARY ANGIOGRAPHY;  Surgeon: Lorretta Harp, MD;  Location: [facility];  Service: Cardiovascular;  Laterality: N/A;   LUMBAR SPINE SURGERY     X4   TRANSCAROTID ARTERY REVASCULARIZATION  Right 04/25/2020   Procedure: RIGHT TRANSCAROTID ARTERY REVASCULARIZATION;  Surgeon: Serafina Mitchell, MD;  Location:  [facility];  Service: Vascular;  Laterality: Right;   ULTRASOUND GUIDANCE FOR VASCULAR ACCESS Right 04/25/2020   Procedure: ULTRASOUND GUIDANCE FOR VASCULAR ACCESS;  Surgeon: Serafina Mitchell, MD;  [facility];  Service: Vascular;  Laterality: Right;    Social History   Socioeconomic History   Marital status: Married    Spouse name: Not on file   Number of children: 3   Years of education: Not on file   Highest education level: Not on file  Occupational History   Occupation: Retired-Construction  Tobacco Use   Smoking status: Former    Packs/day: 0.50    Years: 20.00    Pack years: 10.00    Types: Cigarettes    Start date: 73    Quit date: 1970    Years since quitting: 52.5   Smokeless tobacco: Never   Tobacco comments:    Daily caffeine use, regular exercise  Vaping Use   Vaping Use: Never used  Substance and Sexual Activity   Alcohol use: Yes    Alcohol/week: 1.0 - 2.0 standard drink    Types: 1 - 2 Cans of beer per week   Drug use: No   Sexual activity: Yes  Other Topics Concern   Not on file  Social History Narrative   Not on file  Social Determinants of Health   Financial Resource Strain: Low Risk    Difficulty of Paying Living Expenses: Not hard at all  Food Insecurity: No Food Insecurity   Worried About Charity fundraiser in the Last Year: Never true   [place] in the Last Year: Never true  Transportation Needs: No Transportation Needs   Lack of Transportation (Medical): No   Lack of Transportation (Non-Medical): No  Physical Activity: Sufficiently Active   Days of Exercise per Week: 5 days   Minutes of Exercise per Session: 30 min  Stress: No Stress Concern Present   Feeling of Stress : Not at all  Social Connections: Socially Integrated   Frequency of Communication with Friends and Family: More than three times a week   Frequency of Social Gatherings with Friends and Family: More than three times a week   Attends Religious Services: More  than 4 times per year   Active Member of [employer] or Organizations: No   Attends Music therapist: More than 4 times per year   Marital Status: Married  Human resources officer Violence: Not on file    Family History  Problem Relation Age of Onset   Dementia Father    Diabetes Mother    Peripheral vascular disease Brother    Colon cancer Sister        dx in her 95's    Current Outpatient Medications  Medication Sig Dispense Refill   aspirin EC 81 MG tablet Take 81 mg by mouth daily.     cholecalciferol (VITAMIN D) 1000 units tablet Take 1,000 Units by mouth daily with supper.      cyanocobalamin 500 MCG tablet Take 500 mcg by mouth daily. Vitamin B12     docusate calcium (SURFAK) 240 MG capsule Take 240 mg by mouth at bedtime.     icosapent Ethyl (VASCEPA) 1 g capsule Take 2 capsules (2 g total) by mouth 2 (two) times daily. 120 capsule 3   isosorbide mononitrate (IMDUR) 60 MG 24 hr tablet Take 1 tablet (60 mg total) by mouth daily. 90 tablet 3   losartan (COZAAR) 25 MG tablet TAKE 1 TABLET BY MOUTH DAILY 90 tablet 3   metoprolol tartrate (LOPRESSOR) 25 MG tablet Take by mouth.     Multiple Vitamins-Minerals (PRESERVISION AREDS 2) CAPS Take 1 capsule by mouth daily with supper.     mupirocin ointment (BACTROBAN) 2 % On leg wound w/dressing change qd or bid 30 g 0   nitroGLYCERIN (NITROSTAT) 0.4 MG SL tablet Place 1 tablet (0.4 mg total) under the tongue every 5 (five) minutes as needed for chest pain. 20 tablet 1   Polyethyl Glycol-Propyl Glycol (SYSTANE OP) Place 1 drop into both eyes 4 (four) times daily as needed (dry eyes).     REPATHA SURECLICK 161 MG/ML SOAJ INJECT 140 MG INTO THE SKIN EVERY 14 (FOURTEEN) DAYS. 6 mL 3   traMADol (ULTRAM) 50 MG tablet Take 1 tablet (50 mg total) by mouth every 8 (eight) hours as needed for moderate pain (pain.). If adult CrCl > 30, max dose = 400 mg/day; if CrCl < 30, max dose = 200 mg/day and q12h interval. 15 tablet 0   valACYclovir  (VALTREX) 500 MG tablet Take 500 mg by mouth daily.     clopidogrel (PLAVIX) 75 MG tablet Take 1 tablet (75 mg total) by mouth daily. (Patient not taking: Reported on 11/26/2020) 30 tablet 11   imiquimod (ALDARA) 5 % cream Apply topically daily.  Apply to the right ear daily for the next 2 weeks.  Apply to the facial lesion daily for 4 weeks. (Patient not taking: Reported on 11/26/2020) 12 each 0   REPATHA 140 MG/ML SOSY INECT EVERY 14 DAYS DIRECTED 6 mL 3   No current facility-administered medications for this visit.    Allergies  Allergen Reactions   Amlodipine Other (See Comments)    Weakness in legs   Livalo [Pitavastatin]     Pain, SOB   Oxycodone Other (See Comments)    crazy   Pravastatin Sodium Other (See Comments)    REACTION: bad dreams   Zetia [Ezetimibe]     LEG PAIN    Rosuvastatin Palpitations   Statins Palpitations and Other (See Comments)    REACTION: aches, bad dreams     REVIEW OF SYSTEMS:   [X]  denotes positive finding, [ ]  denotes negative finding Cardiac  Comments:  Chest pain or chest pressure:    Shortness of breath upon exertion:    Short of breath when lying flat:    Irregular heart rhythm:        Vascular    Pain in calf, thigh, or hip brought on by ambulation:    Pain in feet at night that wakes you up from your sleep:     Blood clot in your veins:    Leg swelling:         Pulmonary    Oxygen at home:    Productive cough:     Wheezing:         Neurologic    Sudden weakness in arms or legs:     Sudden numbness in arms or legs:     Sudden onset of difficulty speaking or slurred speech:    Temporary loss of vision in one eye:     Problems with dizziness:         Gastrointestinal    Blood in stool:     Vomited blood:         Genitourinary    Burning when urinating:     Blood in urine:        Psychiatric    Major depression:         Hematologic    Bleeding problems:    Problems with blood clotting too easily:        Skin     Rashes or ulcers:        Constitutional    Fever or chills:      PHYSICAL EXAMINATION:  Vitals:   11/26/20 0848 11/26/20 0851  BP: 139/70 135/66  Pulse: 76 76  Resp: 18   Temp: 98.1 F (36.7 C)   TempSrc: Temporal   SpO2: 100%   Weight: 200 lb (90.7 kg)   Height: 5\' 11"  (1.803 m)     General:  WDWN in NAD; vital signs documented above Gait: Not observed HENT: WNL, normocephalic Pulmonary: normal non-labored breathing Cardiac: regular HR Abdomen: soft, NT, no masses Skin: without rashes Vascular Exam/Pulses:  Right Left  Radial 2+ (normal) 2+ (normal)  DP 2+ (normal) 2+ (normal)   Extremities: without ischemic changes, without Gangrene , without cellulitis; without open wounds;  Musculoskeletal: no muscle wasting or atrophy  Neurologic: A&O X 3;  No focal weakness or paresthesias are detected Psychiatric:  The pt has Normal affect.   Non-Invasive Vascular Imaging:   Right carotid stent widely patent Left ICA 1 to 39% stenosis    ASSESSMENT/PLAN:: 87 y.o. male here for follow  up for carotid artery stenosis status post right TCAR in November 2021  -Subjectively, patient has not experienced any strokelike symptoms since last office visit -Carotid duplex demonstrates a widely patent right internal carotid artery stent; left ICA estimated to be 1 to 39% stenotic -Continue aspirin daily -Recheck carotid duplex in 1 year per protocol   Dagoberto Ligas, PA-C [facility] [phone]  Clinic MD:   Trula Slade

## 2020-11-29 ENCOUNTER — Telehealth (INDEPENDENT_AMBULATORY_CARE_PROVIDER_SITE_OTHER): Payer: PPO | Admitting: Family Medicine

## 2020-11-29 DIAGNOSIS — U071 COVID-19: Secondary | ICD-10-CM

## 2020-11-29 MED ORDER — MOLNUPIRAVIR EUA 200MG CAPSULE: 4.0000 | ORAL_CAPSULE | Freq: Two times a day (BID) | ORAL | 0 refills | Status: AC

## 2020-11-29 NOTE — Progress Notes (Signed)
Virtual Visit via Video Note  I connected with Elenore Rota  on 11/29/20 at  4:20 PM EDT by a video enabled telemedicine application and verified that I am speaking with the correct person using two identifiers.  Location patient: home, [place] Location provider:work or home office Persons participating in the virtual visit: patient, provider  I discussed the limitations of evaluation and management by telemedicine and the availability of in person appointments. The patient expressed understanding and agreed to proceed.   HPI:  Acute telemedicine visit for Covid19: -Onset: 4 days ago, tested positive today -Symptoms include: sore throat, nasal congestion, mild cough -Denies:fevers, CP, SOB, NVD, inability to eat/drink/get out bed -feels "good" per his report, felt a little tired a few days ago -Has tried:mucinex -Pertinent past medical history: see below -Pertinent medication allergies: Allergies  Allergen Reactions   Amlodipine Other (See Comments)    Weakness in legs   Livalo [Pitavastatin]     Pain, SOB   Oxycodone Other (See Comments)    crazy   Pravastatin Sodium Other (See Comments)    REACTION: bad dreams   Zetia [Ezetimibe]     LEG PAIN    Rosuvastatin Palpitations   Statins Palpitations and Other (See Comments)    REACTION: aches, bad dreams  -COVID-19 vaccine status: had two vaccines and 1 booster -no recent labs  ROS: See pertinent positives and negatives per HPI.  Past Medical History:  Diagnosis Date   Allergy    BPH (benign prostatic hypertrophy)    Carotid artery occlusion    Diverticulosis of colon    ED (erectile dysfunction)    GERD (gastroesophageal reflux disease)    Glucose intolerance (impaired glucose tolerance)    Hearing loss    no hearing aids   History of pancreatitis    HSV infection    left eye   Hx-TIA (transient ischemic attack)    Hyperlipidemia    Hypertension    Hypertention, malignant, with acute intensive management    Low back pain     Osteoarthritis    PVD (peripheral vascular disease) ([place])    Bilateral carotid   Tubular adenoma of colon 08/1991   One with carcinoma IN [facility] 1993, multiple adenomatous   Wears partial dentures    upper    Past Surgical History:  Procedure Laterality Date   APPENDECTOMY     BASAL CELL CARCINOMA EXCISION     check and ear lobe   BLEPHAROPLASTY Left    x 3   CHOLECYSTECTOMY     COLONOSCOPY     ESOPHAGOGASTRODUODENOSCOPY     LEFT HEART CATH AND CORONARY ANGIOGRAPHY N/A 01/26/2017   Procedure: LEFT HEART CATH AND CORONARY ANGIOGRAPHY;  Surgeon: Lorretta Harp, MD;  Location: [facility];  Service: Cardiovascular;  Laterality: N/A;   LUMBAR SPINE SURGERY     X4   TRANSCAROTID ARTERY REVASCULARIZATION  Right 04/25/2020   Procedure: RIGHT TRANSCAROTID ARTERY REVASCULARIZATION;  Surgeon: Serafina Mitchell, MD;  Location: [facility];  Service: Vascular;  Laterality: Right;   ULTRASOUND GUIDANCE FOR VASCULAR ACCESS Right 04/25/2020   Procedure: ULTRASOUND GUIDANCE FOR VASCULAR ACCESS;  Surgeon: Serafina Mitchell, MD;  Location: [facility] OR;  Service: Vascular;  Laterality: Right;     Current Outpatient Medications:    molnupiravir EUA 200 mg CAPS, Take 4 capsules (800 mg total) by mouth 2 (two) times daily for 5 days., Disp: 40 capsule, Rfl: 0   aspirin EC 81 MG tablet, Take 81 mg by mouth daily.,  Disp: , Rfl:    cholecalciferol (VITAMIN D) 1000 units tablet, Take 1,000 Units by mouth daily with supper. , Disp: , Rfl:    cyanocobalamin 500 MCG tablet, Take 500 mcg by mouth daily. Vitamin B12, Disp: , Rfl:    docusate calcium (SURFAK) 240 MG capsule, Take 240 mg by mouth at bedtime., Disp: , Rfl:    icosapent Ethyl (VASCEPA) 1 g capsule, Take 2 capsules (2 g total) by mouth 2 (two) times daily., Disp: 120 capsule, Rfl: 3   imiquimod (ALDARA) 5 % cream, Apply topically daily. Apply to the right ear daily for the next 2 weeks.  Apply to the facial lesion daily for 4 weeks. (Patient not  taking: Reported on 11/26/2020), Disp: 12 each, Rfl: 0   isosorbide mononitrate (IMDUR) 60 MG 24 hr tablet, Take 1 tablet (60 mg total) by mouth daily., Disp: 90 tablet, Rfl: 3   losartan (COZAAR) 25 MG tablet, TAKE 1 TABLET BY MOUTH DAILY, Disp: 90 tablet, Rfl: 3   metoprolol tartrate (LOPRESSOR) 25 MG tablet, Take by mouth., Disp: , Rfl:    Multiple Vitamins-Minerals (PRESERVISION AREDS 2) CAPS, Take 1 capsule by mouth daily with supper., Disp: , Rfl:    mupirocin ointment (BACTROBAN) 2 %, On leg wound w/dressing change qd or bid, Disp: 30 g, Rfl: 0   nitroGLYCERIN (NITROSTAT) 0.4 MG SL tablet, Place 1 tablet (0.4 mg total) under the tongue every 5 (five) minutes as needed for chest pain., Disp: 20 tablet, Rfl: 1   Polyethyl Glycol-Propyl Glycol (SYSTANE OP), Place 1 drop into both eyes 4 (four) times daily as needed (dry eyes)., Disp: , Rfl:    REPATHA SURECLICK 694 MG/ML SOAJ, INJECT 140 MG INTO THE SKIN EVERY 14 (FOURTEEN) DAYS., Disp: 6 mL, Rfl: 3   traMADol (ULTRAM) 50 MG tablet, Take 1 tablet (50 mg total) by mouth every 8 (eight) hours as needed for moderate pain (pain.). If adult CrCl > 30, max dose = 400 mg/day; if CrCl < 30, max dose = 200 mg/day and q12h interval., Disp: 15 tablet, Rfl: 0   valACYclovir (VALTREX) 500 MG tablet, Take 500 mg by mouth daily., Disp: , Rfl:   EXAM:  VITALS per patient if applicable:  GENERAL: alert, oriented, appears well and in no acute distress  HEENT: atraumatic, conjunttiva clear, no obvious abnormalities on inspection of external nose and ears  NECK: normal movements of the head and neck  LUNGS: on inspection no signs of respiratory distress, breathing rate appears normal, no obvious gross SOB, gasping or wheezing  CV: no obvious cyanosis  MS: moves all visible extremities without noticeable abnormality  PSYCH/NEURO: pleasant and cooperative, no obvious depression or anxiety, speech and thought processing grossly intact  ASSESSMENT AND  PLAN:  Discussed the following assessment and plan:  COVID-19   Discussed treatment options, ideal treatment window, potential complications, isolation and precautions for COVID-19.  After lengthy discussion, the patient opted for treatment with molnupiravir due to being higher risk for complications of covid or severe disease and other factors. Discussed EUA status of this drug and the fact that there is preliminary limited knowledge of risks/interactions/side effects per EUA document vs possible benefits and precautions. This information was shared with patient during the visit and also was provided in patient instructions. Also, advised that patient discuss risks/interactions and use with pharmacist/treatment team as well. Other symptomatic care measures summarized in patient instructions.  Advised to seek prompt in person care if worsening, new symptoms arise, or if  is not improving with treatment. Discussed options for inperson care if PCP office not available. Did let this patient know that I only do telemedicine on Tuesdays and Thursdays for [place]. Advised to schedule follow up visit with PCP or [facility] if any further questions or concerns to avoid delays in care.   I discussed the assessment and treatment plan with the patient. The patient was provided an opportunity to ask questions and all were answered. The patient agreed with the plan and demonstrated an understanding of the instructions.     Lucretia Kern, DO

## 2020-11-29 NOTE — Patient Instructions (Signed)
HOME CARE TIPS:  -[place] testing information: [email or web address]-[email or web address] OR [phone] Most pharmacies also offer testing and home test kits. If the Covid19 test is positive, please make a prompt follow up visit with your primary care office or with [facility] to discuss treatment options. Treatments for Covid19 are best given early in the course of the illness.   -I sent the medication(s) we discussed to your pharmacy: Meds ordered this encounter  Medications   molnupiravir EUA 200 mg CAPS    Sig: Take 4 capsules (800 mg total) by mouth 2 (two) times daily for 5 days.    Dispense:  40 capsule    Refill:  0     -I sent in the [place] treatment or referral you requested per our discussion. Please see the information provided below and discuss further with the pharmacist/treatment team.   -can use tylenol or aleve if needed for fevers, aches and pains per instructions  -can use nasal saline a few times per day if you have nasal congestion; sometimes  a short course of Afrin nasal spray for 3 days can help with symptoms as well  -stay hydrated, drink plenty of fluids and eat small healthy meals - avoid dairy  -can take 1000 IU (56mg) Vit D3 and 100-500 mg of Vit C daily per instructions  -If the Covid test is positive, check out the [facility] for more information on home care, transmission and treatment for COVID19  -follow up with your doctor in 2-3 days unless improving and feeling better  -stay home while sick, except to seek medical care. If you have COVID19, ideally it would be best to stay home for a full 10 days since the onset of symptoms PLUS one day of no fever and feeling better. Wear a good mask that fits snugly (such as N95 or KN95) if around others to reduce the risk of transmission.  It was nice to meet you today, and I really hope you are feeling better soon. I help  out with telemedicine visits on Tuesdays and  Thursdays and am available for visits on those days. If you have any concerns or questions following this visit please schedule a follow up visit with your Primary Care doctor or seek care at a local urgent care clinic to avoid delays in care.    Seek in person care or schedule a follow up video visit promptly if your symptoms worsen, new concerns arise or you are not improving with treatment. Call 911 and/or seek emergency care if your symptoms are severe or life threatening.    Fact Sheet for Patients And Caregivers Emergency Use Authorization (EUA) Of LAGEVRIOT (molnupiravir) capsules For Coronavirus Disease 2019 (COVID-19)  What is the most important information I should know about LAGEVRIO? LAGEVRIO may cause serious side effects, including: ? LAGEVRIO may cause harm to your unborn baby. It is not known if LAGEVRIO will harm your baby if you take LAGEVRIO during pregnancy. o LAGEVRIO is not recommended for use in pregnancy. o LAGEVRIO has not been studied in pregnancy. LAGEVRIO was studied in pregnant animals only. When LAGEVRIO was given to pregnant animals, LAGEVRIO caused harm to their unborn babies. o You and your healthcare provider may decide that you should take LAGEVRIO during pregnancy if there are no other COVID-19 treatment options approved or authorized by the FDA that are accessible or clinically appropriate for you. o If you and your healthcare provider decide that you should take LWeek[place]augduring pregnancy, you and your healthcare provider  should discuss the known and potential benefits and the potential risks of taking LAGEVRIO during pregnancy. For individuals who are able to become pregnant: ? You should use a reliable method of birth control (contraception) consistently and correctly during treatment with LAGEVRIO and for 4 days after the last dose of LAGEVRIO. Talk to your healthcare provider about reliable birth control methods. ? Before starting treatment with  [facility] your healthcare provider may do a pregnancy test to see if you are pregnant before starting treatment with LAGEVRIO. ? Tell your healthcare provider right away if you become pregnant or think you may be pregnant during treatment with LAGEVRIO. Pregnancy Surveillance Program: ? There is a pregnancy surveillance program for individuals who take LAGEVRIO during pregnancy. The purpose of this program is to collect information about the health of you and your baby. Talk to your healthcare provider about how to take part in this program. ? If you take LAGEVRIO during pregnancy and you agree to participate in the pregnancy surveillance program and allow your healthcare provider to share your information with [place], then your healthcare provider will report your use of [place] during pregnancy to [place]. by calling [phone] or PeacefulBlog.es. For individuals who are sexually active with partners who are able to become pregnant: ? It is not known if LAGEVRIO can affect sperm. While the risk is regarded as low, animal studies to fully assess the potential for LAGEVRIO to affect the babies of males treated with LAGEVRIO have not been completed. A reliable method of birth control (contraception) should be used consistently and correctly during treatment with LAGEVRIO and for at least 3 months after the last dose. The risk to sperm beyond 3 months is not known. Studies to understand the risk to sperm beyond 3 months are ongoing. Talk to your healthcare provider about reliable birth control methods. Talk to your healthcare provider if you have questions or concerns about how LAGEVRIO may affect sperm. You are being given this fact sheet because your healthcare provider believes it is necessary to provide you with LAGEVRIO for the treatment of adults with mild-to-moderate coronavirus disease 2019 (COVID-19) with positive results of direct  SARS-CoV-2 viral testing, and who are at high risk for progression to severe COVID-19 including hospitalization or death, and for whom other COVID-19 treatment options approved or authorized by the FDA are not accessible or clinically appropriate. The U.S. Food and Drug Administration (FDA) has issued an Emergency Use Authorization (EUA) to make LAGEVRIO available during the COVID-19 pandemic (for more details about an EUA please see "What is an Emergency Use Authorization?" at the end of this document). LAGEVRIO is not an FDA-approved medicine in the [place]. Read this Fact Sheet for information about LAGEVRIO. Talk to your healthcare provider about your options if you have any questions. It is your choice to take LAGEVRIO.  What is COVID-19? COVID-19 is caused by a virus called a coronavirus. You can get COVID-19 through close contact with another person who has the virus. COVID-19 illnesses have ranged from very mild-to-severe, including illness resulting in death. While information so far suggests that most COVID-19 illness is mild, serious illness can happen and may cause some of your other medical conditions to become worse. Older people and people of all ages with severe, long lasting (chronic) medical conditions like heart disease, lung disease and diabetes, for example seem to be at higher risk of being hospitalized for COVID-19.  What is LAGEVRIO? LAGEVRIO is an  investigational medicine used to treat mild-to-moderate COVID-19 in adults: ? with positive results of direct SARS-CoV-2 viral testing, and ? who are at high risk for progression to severe COVID-19 including hospitalization or death, and for whom other COVID-19 treatment options approved or authorized by the FDA are not accessible or clinically appropriate. The FDA has authorized the emergency use of LAGEVRIO for the treatment of mild-tomoderate COVID-19 in adults under an EUA. For more information on EUA, see the  "What is an Emergency Use Authorization (EUA)?" section at the end of this Fact Sheet. LAGEVRIO is not authorized: ? for use in people less than 64 years of age. ? for prevention of COVID-19. ? for people needing hospitalization for COVID-19. ? for use for longer than 5 consecutive days.  What should I tell my healthcare provider before I take LAGEVRIO? Tell your healthcare provider if you: ? Have any allergies ? Are breastfeeding or plan to breastfeed ? Have any serious illnesses ? Are taking any medicines (prescription, over-the-counter, vitamins, or herbal products).  How do I take LAGEVRIO? ? Take LAGEVRIO exactly as your healthcare provider tells you to take it. ? Take 4 capsules of LAGEVRIO every 12 hours (for example, at 8 am and at 8 pm) ? Take LAGEVRIO for 5 days. It is important that you complete the full 5 days of treatment with LAGEVRIO. Do not stop taking LAGEVRIO before you complete the full 5 days of treatment, even if you feel better. ? Take LAGEVRIO with or without food. ? You should stay in isolation for as long as your healthcare provider tells you to. Talk to your healthcare provider if you are not sure about how to properly isolate while you have COVID-19. ? Swallow LAGEVRIO capsules whole. Do not open, break, or crush the capsules. If you cannot swallow capsules whole, tell your healthcare provider. ? What to do if you miss a dose: o If it has been less than 10 hours since the missed dose, take it as soon as you remember o If it has been more than 10 hours since the missed dose, skip the missed dose and take your dose at the next scheduled time. ? Do not double the dose of LAGEVRIO to make up for a missed dose.  What are the important possible side effects of LAGEVRIO? ? See, "What is the most important information I should know about LAGEVRIO?" ? Allergic Reactions. Allergic reactions can happen in people taking LAGEVRIO, even after only 1 dose. Stop taking  LAGEVRIO and call your healthcare provider right away if you get any of the following symptoms of an allergic reaction: o hives o rapid heartbeat o trouble swallowing or breathing o swelling of the mouth, lips, or face o throat tightness o hoarseness o skin rash The most common side effects of LAGEVRIO are: ? diarrhea ? nausea ? dizziness These are not all the possible side effects of LAGEVRIO. Not many people have taken LAGEVRIO. Serious and unexpected side effects may happen. This medicine is still being studied, so it is possible that all of the risks are not known at this time.  What other treatment choices are there?  Veklury (remdesivir) is FDA-approved as an intravenous (IV) infusion for the treatment of mildto-moderate FUXNA-35 in certain adults and children. Talk with your doctor to see if Marijean Heath is appropriate for you. Like LAGEVRIO, FDA may also allow for the emergency use of other medicines to treat people with COVID-19. Go to LacrosseProperties.si for more information. It is  your choice to be treated or not to be treated with LAGEVRIO. Should you decide not to take it, it will not change your standard medical care.  What if I am breastfeeding? Breastfeeding is not recommended during treatment with LAGEVRIO and for 4 days after the last dose of LAGEVRIO. If you are breastfeeding or plan to breastfeed, talk to your healthcare provider about your options and specific situation before taking LAGEVRIO.  How do I report side effects with LAGEVRIO? Contact your healthcare provider if you have any side effects that bother you or do not go away. Report side effects to FDA MedWatch at SmoothHits.hu or call 1-800-FDA-1088 (1- ([phone]).  How should I store [place]? ? Store LAGEVRIO capsules at room temperature between 24F to 28F (20C to 25C). ? Keep LAGEVRIO  and all medicines out of the reach of children and pets. How can I learn more about COVID-19? ? Ask your healthcare provider. ? Visit SeekRooms.co.uk ? Contact your local or state public health department. ? Call [place] at [phone] (toll free in the [place] ? Visit www.molnupiravir.com  What Is an Emergency Use Authorization (EUA)? The [place] FDA has made [place] available under an emergency access mechanism called an Emergency Use Authorization (EUA) The EUA is supported by a Presenter, broadcasting Health and Human Service (HHS) declaration that circumstances exist to justify emergency use of drugs and biological products during the COVID-19 pandemic. LAGEVRIO for the treatment of mild-to-moderate COVID-19 in adults with positive results of direct SARS-CoV-2 viral testing, who are at high risk for progression to severe COVID-19, including hospitalization or death, and for whom alternative COVID-19 treatment options approved or authorized by FDA are not accessible or clinically appropriate, has not undergone the same type of review as an FDA-approved product. In issuing an EUA under the MCRFV-43 public health emergency, the FDA has determined, among other things, that based on the total amount of scientific evidence available including data from adequate and well-controlled clinical trials, if available, it is reasonable to believe that the product may be effective for diagnosing, treating, or preventing COVID-19, or a serious or life-threatening disease or condition caused by COVID-19; that the known and potential benefits of the product, when used to diagnose, treat, or prevent such disease or condition, outweigh the known and potential risks of such product; and that there are no adequate, approved, and available alternatives.  All of these criteria must be met to allow for the product to be used in the treatment of patients during the COVID-19 pandemic. The EUA for  LAGEVRIO is in effect for the duration of the COVID-19 declaration justifying emergency use of LAGEVRIO, unless terminated or revoked (after which LAGEVRIO may no longer be used under the EUA). For patent information: http://rogers.info/ Copyright  2021-2022 [employer] [place], [place] and its affiliates. All rights reserved. [id number] Revised: March 2022

## 2020-12-26 LAB — HEMOGLOBIN A1C: Hemoglobin A1C: 6.9

## 2020-12-26 LAB — BASIC METABOLIC PANEL: BUN: 17 (ref 4–21)

## 2020-12-26 LAB — MICROALBUMIN, URINE: Microalb, Ur: 65

## 2021-01-09 ENCOUNTER — Ambulatory Visit (INDEPENDENT_AMBULATORY_CARE_PROVIDER_SITE_OTHER): Payer: PPO | Admitting: Internal Medicine

## 2021-01-09 ENCOUNTER — Other Ambulatory Visit: Payer: Self-pay

## 2021-01-09 ENCOUNTER — Encounter: Payer: Self-pay | Admitting: Internal Medicine

## 2021-01-09 DIAGNOSIS — L578 Other skin changes due to chronic exposure to nonionizing radiation: Secondary | ICD-10-CM | POA: Diagnosis not present

## 2021-01-09 DIAGNOSIS — M5440 Lumbago with sciatica, unspecified side: Secondary | ICD-10-CM

## 2021-01-09 DIAGNOSIS — E538 Deficiency of other specified B group vitamins: Secondary | ICD-10-CM | POA: Diagnosis not present

## 2021-01-09 DIAGNOSIS — G8929 Other chronic pain: Secondary | ICD-10-CM

## 2021-01-09 DIAGNOSIS — R739 Hyperglycemia, unspecified: Secondary | ICD-10-CM | POA: Diagnosis not present

## 2021-01-09 DIAGNOSIS — I10 Essential (primary) hypertension: Secondary | ICD-10-CM

## 2021-01-09 DIAGNOSIS — I700 Atherosclerosis of aorta: Secondary | ICD-10-CM

## 2021-01-09 MED ORDER — TRIAMCINOLONE ACETONIDE 0.1 % EX OINT: 1.0000 "application " | TOPICAL_OINTMENT | Freq: Two times a day (BID) | CUTANEOUS | 3 refills | Status: AC

## 2021-01-09 NOTE — Assessment & Plan Note (Addendum)
On Vit B12 supposedly Will get labs including vitamin B12

## 2021-01-09 NOTE — Patient Instructions (Addendum)
  Try Apple Cider Vinegar and/or Cinnamon capsules

## 2021-01-09 NOTE — Assessment & Plan Note (Signed)
Check A1c Try Apple Cider Vinegar and Cinnamon tablets Pt refused Rx

## 2021-01-09 NOTE — Assessment & Plan Note (Signed)
Losartan, Toprol, Isosorbide

## 2021-01-09 NOTE — Assessment & Plan Note (Addendum)
Worse - B forearms Prescribed Triamc oint for irritated skin Sun protection She used 5-FU cream in the past

## 2021-01-09 NOTE — Progress Notes (Signed)
Subjective:  Patient ID: Richard Burgess, male    DOB: 10-10-1928  Age: 87 y.o. MRN: [id number]  CC: elevated blood sugars (Daughter states he had some bloodwork his glucose was high. Had A1c 6.9 want to discuss )   HPI ALEXSIS MAURER presents for elevated glucose - A1c was 6.9% in 7/22. CBG was 234 once. He is here w/his dtr [name].  History was obtained from the patient and his daughter. F/u HTN, CAD Pt lost wt on diet C/o itchy forearms  Outpatient Medications Prior to Visit  Medication Sig Dispense Refill   aspirin EC 81 MG tablet Take 81 mg by mouth daily.     cholecalciferol (VITAMIN D) 1000 units tablet Take 1,000 Units by mouth daily with supper.      cyanocobalamin 500 MCG tablet Take 500 mcg by mouth daily. Vitamin B12     docusate calcium (SURFAK) 240 MG capsule Take 240 mg by mouth at bedtime.     icosapent Ethyl (VASCEPA) 1 g capsule Take 2 capsules (2 g total) by mouth 2 (two) times daily. 120 capsule 3   isosorbide mononitrate (IMDUR) 60 MG 24 hr tablet Take 1 tablet (60 mg total) by mouth daily. 90 tablet 3   losartan (COZAAR) 25 MG tablet TAKE 1 TABLET BY MOUTH DAILY 90 tablet 3   metoprolol tartrate (LOPRESSOR) 25 MG tablet Take by mouth.     Multiple Vitamins-Minerals (PRESERVISION AREDS 2) CAPS Take 1 capsule by mouth daily with supper.     nitroGLYCERIN (NITROSTAT) 0.4 MG SL tablet Place 1 tablet (0.4 mg total) under the tongue every 5 (five) minutes as needed for chest pain. 20 tablet 1   Polyethyl Glycol-Propyl Glycol (SYSTANE OP) Place 1 drop into both eyes 4 (four) times daily as needed (dry eyes).     REPATHA SURECLICK [id number] MG/ML SOAJ INJECT 140 MG INTO THE SKIN EVERY 14 (FOURTEEN) DAYS. 6 mL 3   traMADol (ULTRAM) 50 MG tablet Take 1 tablet (50 mg total) by mouth every 8 (eight) hours as needed for moderate pain (pain.). If adult CrCl > 30, max dose = 400 mg/day; if CrCl < 30, max dose = 200 mg/day and q12h interval. 15 tablet 0   valACYclovir (VALTREX) 500 MG  tablet Take 500 mg by mouth daily.     imiquimod (ALDARA) 5 % cream Apply topically daily. Apply to the right ear daily for the next 2 weeks.  Apply to the facial lesion daily for 4 weeks. (Patient not taking: No sig reported) 12 each 0   mupirocin ointment (BACTROBAN) 2 % On leg wound w/dressing change qd or bid (Patient not taking: Reported on 01/09/2021) 30 g 0   No facility-administered medications prior to visit.    ROS: Review of Systems  Constitutional:  Negative for appetite change, fatigue and unexpected weight change.  HENT:  Negative for congestion, nosebleeds, sneezing, sore throat and trouble swallowing.   Eyes:  Negative for itching and visual disturbance.  Respiratory:  Negative for cough.   Cardiovascular:  Negative for chest pain, palpitations and leg swelling.  Gastrointestinal:  Negative for abdominal distention, blood in stool, diarrhea and nausea.  Genitourinary:  Negative for frequency and hematuria.  Musculoskeletal:  Positive for arthralgias. Negative for back pain, gait problem, joint swelling and neck pain.  Skin:  Negative for rash.  Neurological:  Negative for dizziness, tremors, speech difficulty and weakness.  Psychiatric/Behavioral:  Negative for agitation, dysphoric mood and sleep disturbance. The patient is not nervous/anxious.  Objective:  BP 120/68 (BP Location: Left Arm)   Pulse 71   Temp 98.4 F (36.9 C) (Oral)   Ht '5\' 11"'$  (1.803 m)   Wt 197 lb (89.4 kg)   SpO2 96%   BMI 27.48 kg/m   BP Readings from Last 3 Encounters:  01/09/21 120/68  11/26/20 135/66  05/28/20 138/79    Wt Readings from Last 3 Encounters:  01/09/21 197 lb (89.4 kg)  11/26/20 200 lb (90.7 kg)  05/28/20 211 lb (95.7 kg)    Physical Exam Constitutional:      General: He is not in acute distress.    Appearance: He is well-developed. He is obese.     Comments: NAD  Eyes:     Conjunctiva/sclera: Conjunctivae normal.     Pupils: Pupils are equal, round, and reactive  to light.  Neck:     Thyroid: No thyromegaly.     Vascular: No JVD.  Cardiovascular:     Rate and Rhythm: Normal rate and regular rhythm.     Heart sounds: Normal heart sounds. No murmur heard.   No friction rub. No gallop.  Pulmonary:     Effort: Pulmonary effort is normal. No respiratory distress.     Breath sounds: Normal breath sounds. No wheezing or rales.  Chest:     Chest wall: No tenderness.  Abdominal:     General: Bowel sounds are normal. There is no distension.     Palpations: Abdomen is soft. There is no mass.     Tenderness: There is no abdominal tenderness. There is no guarding or rebound.  Musculoskeletal:        General: No tenderness. Normal range of motion.     Cervical back: Normal range of motion.  Lymphadenopathy:     Cervical: No cervical adenopathy.  Skin:    General: Skin is warm and dry.     Findings: No rash.  Neurological:     Mental Status: He is alert and oriented to person, place, and time.     Cranial Nerves: No cranial nerve deficit.     Motor: No abnormal muscle tone.     Coordination: Coordination normal.     Gait: Gait normal.     Deep Tendon Reflexes: Reflexes are normal and symmetric.  Psychiatric:        Behavior: Behavior normal.        Thought Content: Thought content normal.        Judgment: Judgment normal.  AKs/rash on forearms  Lab Results  Component Value Date   WBC 10.1 04/26/2020   HGB 10.9 (L) 04/26/2020   HCT 32.0 (L) 04/26/2020   PLT 149 (L) 04/26/2020   GLUCOSE 178 (H) 04/26/2020   CHOL 110 04/25/2019   TRIG 151 (H) 04/25/2019   HDL 42 04/25/2019   LDLDIRECT 153.0 02/18/2016   LDLCALC 42 04/25/2019   ALT 21 04/20/2020   AST 20 04/20/2020   NA 136 04/26/2020   K 3.9 04/26/2020   CL 106 04/26/2020   CREATININE 1.22 04/26/2020   BUN 26 (H) 04/26/2020   CO2 19 (L) 04/26/2020   TSH 2.88 02/18/2016   PSA 0.28 03/23/2014   INR 1.0 04/20/2020   HGBA1C 5.9 (H) 01/25/2017   MICROALBUR 0.2 12/13/2007    VAS US  CAROTID  Result Date: 11/26/2020 Carotid Arterial Duplex Study Patient Name:  Richard Burgess  Date of Exam:   11/26/2020 Medical Rec #: [id number]        Accession #:  [id number] Date of Birth: [birth year]         Patient Gender: M Patient Age:   89Y Exam Location:  [facility] Procedure:      VAS US CAROTID Referring Phys: [id number] Butch Penny [facility] --------------------------------------------------------------------------------  Indications:       Right stent and TCAR 04/25/2020 . Risk Factors:      Hypertension, hyperlipidemia, PAD. Comparison Study:  Increased right ECA velocity since prior exam of 05/28/2020. Performing Technologist: Alvia Grove RVT  Examination Guidelines: A complete evaluation includes B-mode imaging, spectral Doppler, color Doppler, and power Doppler as needed of all accessible portions of each vessel. Bilateral testing is considered an integral part of a complete examination. Limited examinations for reoccurring indications may be performed as noted.  Right Carotid Findings: +----------+--------+--------+--------+------------------+--------+           PSV cm/sEDV cm/sStenosisPlaque DescriptionComments +----------+--------+--------+--------+------------------+--------+ CCA Prox  69      13              heterogenous               +----------+--------+--------+--------+------------------+--------+ CCA Mid   74      15              heterogenous               +----------+--------+--------+--------+------------------+--------+ CCA Distal73      16                                         +----------+--------+--------+--------+------------------+--------+ ICA Prox                                            stent    +----------+--------+--------+--------+------------------+--------+ ICA Mid   97      22                                         +----------+--------+--------+--------+------------------+--------+ ICA Distal52      16                                          +----------+--------+--------+--------+------------------+--------+ ECA       395     36      >50%                               +----------+--------+--------+--------+------------------+--------+ +----------+--------+-------+--------+-------------------+           PSV cm/sEDV cmsDescribeArm Pressure (mmHG) +----------+--------+-------+--------+-------------------+ [id number]     6      Stenotic                    +----------+--------+-------+--------+-------------------+ +---------+--------+--+--------+-+----------------------+ VertebralPSV cm/s34EDV cm/s3Antegrade and Atypical +---------+--------+--+--------+-+----------------------+  Right Stent(s): +---------------+---+--++++ Prox to Stent  11233 +---------------+---+--++++ Proximal Stent 91 20 +---------------+---+--++++ Mid Stent      10621 +---------------+---+--++++ Distal Stent   10123 +---------------+---+--++++ Distal to Stent92 20 +---------------+---+--++++   Left Carotid Findings: +----------+--------+--------+--------+------------------+--------+           PSV cm/sEDV cm/sStenosisPlaque DescriptionComments +----------+--------+--------+--------+------------------+--------+ CCA Prox  79  10              heterogenous               +----------+--------+--------+--------+------------------+--------+ CCA Mid   80      12                                         +----------+--------+--------+--------+------------------+--------+ CCA Distal70      11                                         +----------+--------+--------+--------+------------------+--------+ ICA Prox  56      17      1-39%                              +----------+--------+--------+--------+------------------+--------+ ICA Mid   59      19                                         +----------+--------+--------+--------+------------------+--------+ ICA  Distal53      16                                         +----------+--------+--------+--------+------------------+--------+ ECA       101     12                                         +----------+--------+--------+--------+------------------+--------+ +----------+--------+--------+----------------+-------------------+           PSV cm/sEDV cm/sDescribe        Arm Pressure (mmHG) +----------+--------+--------+----------------+-------------------+ [id number]     8       Multiphasic, WNL                    +----------+--------+--------+----------------+-------------------+ +---------+--------+--+--------+-+---------+ VertebralPSV cm/s36EDV cm/s8Antegrade +---------+--------+--+--------+-+---------+   Summary: Right Carotid: The ECA appears >50% stenosed. Patent stent with no evidence for                restenosis. Left Carotid: Velocities in the left ICA are consistent with a 1-39% stenosis. Vertebrals:  Bilateral vertebral arteries demonstrate antegrade flow, atypical              right vertebral artery waveform. Subclavians: Right subclavian artery was stenotic. Normal flow hemodynamics were              seen in the left subclavian artery. *See table(s) above for measurements and observations.  Electronically signed by Harold Barban MD on 11/26/2020 at 1:52:01 PM.    Final     Assessment & Plan:     [name], MD

## 2021-01-09 NOTE — Assessment & Plan Note (Signed)
Off meds 

## 2021-01-09 NOTE — Assessment & Plan Note (Signed)
  On diet  

## 2021-01-14 ENCOUNTER — Encounter: Payer: Self-pay | Admitting: Internal Medicine

## 2021-01-15 ENCOUNTER — Telehealth: Payer: Self-pay | Admitting: Internal Medicine

## 2021-01-15 NOTE — Chronic Care Management (AMB) (Signed)
  Chronic Care Management   Outreach Note  01/15/2021 Name: Richard Burgess MRN: [id number] DOB: 10/17/1928  Referred by: Cassandria Anger, MD Reason for referral : No chief complaint on file.   An unsuccessful telephone outreach was attempted today. The patient was referred to the pharmacist for assistance with care management and care coordination.   Follow Up Plan:   Lauretta Grill Upstream Scheduler

## 2021-01-22 ENCOUNTER — Telehealth: Payer: Self-pay | Admitting: Lab

## 2021-01-22 NOTE — Progress Notes (Signed)
  Chronic Care Management   Outreach Note  01/22/2021 Name: DOTSON GRASER MRN: [id number] DOB: 1928-11-05  Referred by: Cassandria Anger, MD Reason for referral : Medication Management   A second unsuccessful telephone outreach was attempted today. The patient was referred to pharmacist for assistance with care management and care coordination.  Follow Up Plan:   [place]

## 2021-01-22 NOTE — Chronic Care Management (AMB) (Signed)
  Chronic Care Management   Note  01/22/2021 Name: Richard Burgess MRN: [id number] DOB: 25-[name]-1929  Richard Burgess is a 87 y.o. year old male who is a primary care patient of Plotnikov, Evie Lacks, MD. I reached out to April Manson by phone today in response to a referral sent by Richard Burgess PCP, Plotnikov, Evie Lacks, MD.   Richard Burgess was given information about Chronic Care Management services today including:  CCM service includes personalized support from designated clinical staff supervised by his physician, including individualized plan of care and coordination with other care providers 24/7 contact phone numbers for assistance for urgent and routine care needs. Service will only be billed when office clinical staff spend 20 minutes or more in a month to coordinate care. Only one practitioner may furnish and bill the service in a calendar month. The patient may stop CCM services at any time (effective at the end of the month) by phone call to the office staff.   Patient agreed to services and verbal consent obtained.   Follow up plan:   [place]

## 2021-02-21 ENCOUNTER — Telehealth: Payer: Self-pay

## 2021-02-21 NOTE — Chronic Care Management (AMB) (Signed)
    Chronic Care Management Pharmacy Assistant   Name: Richard Burgess  MRN: [id number] DOB: 05/04/1929  Richard Burgess is an 87 y.o. year old male who presents for his initial CCM visit with the clinical pharmacist.   Recent office visits:  01/09/21 Lew Dawes MD PCP- Patient was seen for Artherosclerosis of aorta. Labs were ordered and pt started Traimcinilone 0.1%. Follow up in 3 months.  11/29/20 (Video) Lucretia Kern DO- pt was seen for covid 19. Pt started Molnupiravir 800 mg BID. Follow up in 2-3 days or PRN  Recent consult visits:  11/26/20 Dagoberto Ligas PA-C [facility].- pt was seen for Bilateral extracranial carotid artery stenosis. Pt reported no longer taking Clopidogrel. Follow up in 1 year for recheck  10/10/20 Amy [facility]- pt was seen for impacted cerumen. Left ear was irrigated and pt was advised on when to f/u.   Hospital visits:  None in previous 6 months  Medications: Outpatient Encounter Medications as of 02/21/2021  Medication Sig   aspirin EC 81 MG tablet Take 81 mg by mouth daily.   cholecalciferol (VITAMIN D) 1000 units tablet Take 1,000 Units by mouth daily with supper.    cyanocobalamin 500 MCG tablet Take 500 mcg by mouth daily. Vitamin B12   docusate calcium (SURFAK) 240 MG capsule Take 240 mg by mouth at bedtime.   icosapent Ethyl (VASCEPA) 1 g capsule Take 2 capsules (2 g total) by mouth 2 (two) times daily.   isosorbide mononitrate (IMDUR) 60 MG 24 hr tablet Take 1 tablet (60 mg total) by mouth daily.   losartan (COZAAR) 25 MG tablet TAKE 1 TABLET BY MOUTH DAILY   metoprolol tartrate (LOPRESSOR) 25 MG tablet Take by mouth.   Multiple Vitamins-Minerals (PRESERVISION AREDS 2) CAPS Take 1 capsule by mouth daily with supper.   nitroGLYCERIN (NITROSTAT) 0.4 MG SL tablet Place 1 tablet (0.4 mg total) under the tongue every 5 (five) minutes as needed for chest pain.   Polyethyl Glycol-Propyl Glycol (SYSTANE OP) Place 1 drop into both eyes 4 (four) times daily  as needed (dry eyes).   REPATHA SURECLICK [id number] MG/ML SOAJ INJECT 140 MG INTO THE SKIN EVERY 14 (FOURTEEN) DAYS.   traMADol (ULTRAM) 50 MG tablet Take 1 tablet (50 mg total) by mouth every 8 (eight) hours as needed for moderate pain (pain.). If adult CrCl > 30, max dose = 400 mg/day; if CrCl < 30, max dose = 200 mg/day and q12h interval.   triamcinolone ointment (KENALOG) 0.1 % Apply 1 application topically 2 (two) times daily.   valACYclovir (VALTREX) 500 MG tablet Take 500 mg by mouth daily.   No facility-administered encounter medications on file as of 02/21/2021.    Current Medication List  aspirin EC 81 MG tablet cholecalciferol (VITAMIN D) 1000 units tablet cyanocobalamin 500 MCG tablet docusate calcium (SURFAK) 240 MG capsule losartan (COZAAR) 25 MG tablet last filled 12/31/20 90 DS metoprolol tartrate (LOPRESSOR) 25 MG tablet Multiple Vitamins-Minerals (PRESERVISION AREDS 2) CAPS Polyethyl Glycol-Propyl Glycol (SYSTANE OP) REPATHA SURECLICK [id number] MG/ML SOAJ last filled 02/12/21 90 DS   [place] Clinical Pharmacist Assistant [phone]

## 2021-02-27 ENCOUNTER — Ambulatory Visit: Payer: PPO

## 2021-03-13 ENCOUNTER — Other Ambulatory Visit: Payer: Self-pay

## 2021-03-13 ENCOUNTER — Ambulatory Visit (INDEPENDENT_AMBULATORY_CARE_PROVIDER_SITE_OTHER): Payer: PPO

## 2021-03-13 DIAGNOSIS — I700 Atherosclerosis of aorta: Secondary | ICD-10-CM

## 2021-03-13 DIAGNOSIS — R739 Hyperglycemia, unspecified: Secondary | ICD-10-CM

## 2021-03-13 DIAGNOSIS — I10 Essential (primary) hypertension: Secondary | ICD-10-CM

## 2021-03-13 DIAGNOSIS — E785 Hyperlipidemia, unspecified: Secondary | ICD-10-CM
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
## 2021-03-13 NOTE — Patient Instructions (Addendum)
April Manson,  It was great to talk to you today!  Please call me with any questions or concerns.  Visit Information   PATIENT GOALS:   Goals Addressed             This Visit's Progress    Track and Manage My Blood Pressure-Hypertension       Timeframe:  Long-Range Goal Priority:  High Start Date:  03/13/2021                          Expected End Date: 08/11/2021                      Follow Up Date 06/18/2021   - check blood pressure 1-2 times per week - choose a place to take my blood pressure (home, clinic or office, retail store) - write blood pressure results in a log or diary    Why is this important?   You won't feel high blood pressure, but it can still hurt your blood vessels.  High blood pressure can cause heart or kidney problems. It can also cause a stroke.  Making lifestyle changes like losing a little weight or eating less salt will help.  Checking your blood pressure at home and at different times of the day can help to control blood pressure.  If the doctor prescribes medicine remember to take it the way the doctor ordered.  Call the office if you cannot afford the medicine or if there are questions about it.          Consent to CCM Services: Mr. Lundstrom was given information about Chronic Care Management services including:  CCM service includes personalized support from designated clinical staff supervised by his physician, including individualized plan of care and coordination with other care providers 24/7 contact phone numbers for assistance for urgent and routine care needs. Service will only be billed when office clinical staff spend 20 minutes or more in a month to coordinate care. Only one practitioner may furnish and bill the service in a calendar month. The patient may stop CCM services at any time (effective at the end of the month) by phone call to the office staff. The patient will be responsible for cost sharing (co-pay) of up to 20% of the  service fee (after annual deductible is met).  Patient agreed to services and verbal consent obtained.   Patient verbalizes understanding of instructions provided today and agrees to view in [place].   Telephone follow up appointment with care management team member scheduled for: 3 months The patient has been provided with contact information for the care management team and has been advised to call with any health related questions or concerns.   Tomasa Blase, PharmD Clinical Pharmacist, [place]   CLINICAL CARE PLAN: Patient Care Plan: CCM Care Plan     Problem Identified: Hypertension, Hyperlipidemia, Prediabetes, and Coronary Artery Disease   Priority: High  Onset Date: 03/13/2021     Long-Range Goal: Disease Management   Start Date: 03/13/2021  Expected End Date: 09/11/2021  This Visit's Progress: On track  Priority: High  Note:   Current Barriers:  Unable to independently monitor therapeutic efficacy  Pharmacist Clinical Goal(s):  Patient will achieve adherence to monitoring guidelines and medication adherence to achieve therapeutic efficacy achieve improvement in blood sugar / BP control as evidenced by next A1c / BP logs through collaboration with PharmD and provider.   Interventions:  1:1 collaboration with Plotnikov, Evie Lacks, MD regarding development and update of comprehensive plan of care as evidenced by provider attestation and co-signature Inter-disciplinary care team collaboration (see longitudinal plan of care) Comprehensive medication review performed; medication list updated in electronic medical record  Hypertension (BP goal <130/80) -Controlled -Current treatment: Losartan 66m - 1 tablet daily  Isosorbide mononitrate 634m- 1 tablet daily  -Medications previously tried: amlodipine, lisinopril, metoprolol succinate,   -Current home readings: 132/70's  BP Readings from Last 3 Encounters:  01/09/21 120/68  11/26/20 135/66  05/28/20  138/79  -Current dietary habits: reports to salting foods regularly, 1 cup of coffee daily, water otherwise  -Current exercise habits: active working in his yard / garden -Denies hypotensive/hypertensive symptoms -Educated on BP goals and benefits of medications for prevention of heart attack, stroke and kidney damage; Daily salt intake goal < 2300 mg; Exercise goal of 150 minutes per week; Importance of home blood pressure monitoring; Proper BP monitoring technique; Symptoms of hypotension and importance of maintaining adequate hydration; -Counseled to monitor BP at home 1-2 times weekly , document, and provide log at future appointments -Counseled on diet and exercise extensively Recommended to continue current medication  Hyperlipidemia/ Coronary Artery Disease/ Previous NSTEMI: (LDL goal < 70) Lab Results  Component Value Date   LDLCALC 22 10/05/2020  -Controlled -Current treatment: Repatha 14033m inject 140m27mery 14 days  Vascepa 1g - 2 capsules twice daily - not taking at this time due to cost - prescribed by [facility]  Aspirin 81mg40m tablet daily  Nitroglycerin 0.4mg S28mablets - 1 tablet every 5 minutes as needed x 3 doses   -Medications previously tried: lovaza, colesevalem, zetia, livalo  -Current dietary patterns: reports low intake of high cholesterol foods - eats good amount of fresh vegetables from his garden  -Current exercise habits: active working in his yard / garden -Educated on Cholesterol goals;  Importance of limiting foods high in cholesterol; Exercise goal of 150 minutes per week; -Counseled on diet and exercise extensively Recommended for patient to start fish oils - 2g twice daily as he is unable to obtain vascepa at this time as it was not approved by [facility]  [place] (A1c goal <6.5%) -Not ideally controlled Lab Results  Component Value Date   HGBA1C 6.9 12/26/2020  -Current medications: N/a - has refused in the past  -Medications previously tried: n/a  - refused metformin prescription   -Denies hypoglycemic/hyperglycemic symptoms -Current meal patterns:  breakfast: banana, cheese crackers  lunch: at times may not eat, may have a sandwich   dinner: homemade - typically will have soup, vegetables, hot dog on occasion  snacks: apples, sweets on occasion  drinks: coffee and water  -Current exercise: active working in his yard / garden -Educated on A1c and blood sugar goals; Complications of diabetes including kidney damage, retinal damage, and cardiovascular disease; Exercise goal of 150 minutes per week; Benefits of weight loss; Carbohydrate counting and/or plate method -Counseled to check feet daily and get yearly eye exams -Counseled on diet and exercise extensively  Health Maintenance -Vaccine gaps: tetanus, COVID booster, influenza vaccine -Current therapy:  Preservision AERDS2 - 1 capsule twice daily  Vitamin D 2000units- 1 tablet daily  Melatonin 3mg - 24mablet daily  Cinnamon 500mg - 42mpsules daily  Vitamin B12 500mcg - 37mblet daily  Docusate 240mg - 1 52mule daily  Valacyclovir 500mg - 1 t13mt daily  Systane Ophthalmic solution - 1 drop into each eye 4 times daily  as needed  -Educated on Herbal supplement research is limited and benefits usually cannot be proven Cost vs benefit of each product must be carefully weighed by individual consumer -Patient is satisfied with current therapy and denies issues -Recommended to continue current medication  Patient Goals/Self-Care Activities Patient will:  - take medications as prescribed check blood pressure 1-2 times weekly, document, and provide at future appointments collaborate with provider on medication access solutions target a minimum of 150 minutes of moderate intensity exercise weekly  Follow Up Plan: Telephone follow up appointment with care management team member scheduled for: 3 months  The patient has been provided with contact information for the care  management team and has been advised to call with any health related questions or concerns.

## 2021-04-08 DIAGNOSIS — E785 Hyperlipidemia, unspecified: Secondary | ICD-10-CM | POA: Diagnosis not present

## 2021-04-08 DIAGNOSIS — I10 Essential (primary) hypertension: Secondary | ICD-10-CM | POA: Diagnosis not present

## 2021-04-12 ENCOUNTER — Other Ambulatory Visit: Payer: Self-pay

## 2021-04-12 ENCOUNTER — Other Ambulatory Visit (INDEPENDENT_AMBULATORY_CARE_PROVIDER_SITE_OTHER): Payer: PPO

## 2021-04-12 DIAGNOSIS — I10 Essential (primary) hypertension: Secondary | ICD-10-CM | POA: Diagnosis not present

## 2021-04-12 DIAGNOSIS — E538 Deficiency of other specified B group vitamins: Secondary | ICD-10-CM | POA: Diagnosis not present

## 2021-04-12 DIAGNOSIS — R739 Hyperglycemia, unspecified: Secondary | ICD-10-CM

## 2021-04-12 LAB — COMPREHENSIVE METABOLIC PANEL
ALT: 17 U/L (ref 0–53)
AST: 19 U/L (ref 0–37)
Albumin: 4.3 g/dL (ref 3.5–5.2)
Alkaline Phosphatase: 68 U/L (ref 39–117)
BUN: 22 mg/dL (ref 6–23)
CO2: 28 mEq/L (ref 19–32)
Calcium: 9.6 mg/dL (ref 8.4–10.5)
Chloride: 105 mEq/L (ref 96–112)
Creatinine, Ser: 0.96 mg/dL (ref 0.40–1.50)
GFR: 69.67 mL/min (ref >60.00)
Glucose, Bld: 124 mg/dL — ABNORMAL HIGH (ref 70–99)
Potassium: 4.1 mEq/L (ref 3.5–5.1)
Sodium: 139 mEq/L (ref 135–145)
Total Bilirubin: 1.1 mg/dL (ref 0.2–1.2)
Total Protein: 7.2 g/dL (ref 6.0–8.3)

## 2021-04-12 LAB — CBC WITH DIFFERENTIAL/PLATELET
Basophils Absolute: 0 10*3/uL (ref 0.0–0.1)
Basophils Relative: 0.4 % (ref 0.0–3.0)
Eosinophils Absolute: 0.3 10*3/uL (ref 0.0–0.7)
Eosinophils Relative: 3.9 % (ref 0.0–5.0)
HCT: 40.3 % (ref 39.0–52.0)
Hemoglobin: 13.6 g/dL (ref 13.0–17.0)
Lymphocytes Relative: 26.4 % (ref 12.0–46.0)
Lymphs Abs: 2.1 10*3/uL (ref 0.7–4.0)
MCHC: 33.8 g/dL (ref 30.0–36.0)
MCV: 89.2 fl (ref 78.0–100.0)
Monocytes Absolute: 0.8 10*3/uL (ref 0.1–1.0)
Monocytes Relative: 9.9 % (ref 3.0–12.0)
Neutro Abs: 4.7 10*3/uL (ref 1.4–7.7)
Neutrophils Relative %: 59.4 % (ref 43.0–77.0)
Platelets: 160 10*3/uL (ref 150.0–400.0)
RBC: 4.52 Mil/uL (ref 4.22–5.81)
RDW: 14.1 % (ref 11.5–15.5)
WBC: 7.9 10*3/uL (ref 4.0–10.5)

## 2021-04-12 LAB — URINALYSIS
Bilirubin Urine: NEGATIVE
Hgb urine dipstick: NEGATIVE
Ketones, ur: NEGATIVE
Leukocytes,Ua: NEGATIVE
Nitrite: NEGATIVE
Specific Gravity, Urine: 1.01 (ref 1.000–1.030)
Total Protein, Urine: NEGATIVE
Urine Glucose: NEGATIVE
Urobilinogen, UA: 0.2 (ref 0.0–1.0)
pH: 6.5 (ref 5.0–8.0)

## 2021-04-12 LAB — LIPID PANEL
Cholesterol: 108 mg/dL (ref 0–200)
HDL: 43.4 mg/dL (ref >39.00)
LDL Cholesterol: 43 mg/dL (ref 0–99)
NonHDL: 64.85
Total CHOL/HDL Ratio: 2
Triglycerides: 109 mg/dL (ref 0.0–149.0)
VLDL: 21.8 mg/dL (ref 0.0–40.0)

## 2021-04-12 LAB — VITAMIN B12: Vitamin B-12: 495 pg/mL (ref 211–911)

## 2021-04-12 LAB — HEMOGLOBIN A1C: Hgb A1c MFr Bld: 7 % — ABNORMAL HIGH (ref 4.6–6.5)

## 2021-04-12 LAB — TSH: TSH: 3.29 u[IU]/mL (ref 0.35–5.50)

## 2021-04-15 ENCOUNTER — Other Ambulatory Visit: Payer: Self-pay

## 2021-04-15 ENCOUNTER — Encounter: Payer: Self-pay | Admitting: Internal Medicine

## 2021-04-15 ENCOUNTER — Ambulatory Visit (INDEPENDENT_AMBULATORY_CARE_PROVIDER_SITE_OTHER): Payer: PPO | Admitting: Internal Medicine

## 2021-04-15 VITALS — BP 110/62 | HR 74 | Temp 98.8°F | Ht 71.0 in | Wt 196.6 lb

## 2021-04-15 DIAGNOSIS — R739 Hyperglycemia, unspecified: Secondary | ICD-10-CM | POA: Diagnosis not present

## 2021-04-15 DIAGNOSIS — E1159 Type 2 diabetes mellitus with other circulatory complications: Secondary | ICD-10-CM | POA: Insufficient documentation

## 2021-04-15 DIAGNOSIS — I2510 Atherosclerotic heart disease of native coronary artery without angina pectoris: Secondary | ICD-10-CM

## 2021-04-15 DIAGNOSIS — E669 Obesity, unspecified: Secondary | ICD-10-CM | POA: Diagnosis not present

## 2021-04-15 DIAGNOSIS — E1169 Type 2 diabetes mellitus with other specified complication: Secondary | ICD-10-CM | POA: Insufficient documentation

## 2021-04-15 DIAGNOSIS — I10 Essential (primary) hypertension: Secondary | ICD-10-CM | POA: Diagnosis not present

## 2021-04-15 NOTE — Assessment & Plan Note (Signed)
Monitoring clinically

## 2021-04-15 NOTE — Assessment & Plan Note (Signed)
Pt refused tablets On Apple cider and Cinnamon supplements

## 2021-04-15 NOTE — Assessment & Plan Note (Signed)
Cont on Losartan, Toprol, Isosorbide

## 2021-04-15 NOTE — Progress Notes (Signed)
Subjective:  Patient ID: Richard Burgess, male    DOB: [birth year]  Age: 87 y.o. MRN: [id number]  CC: Follow-up (3 month f/u)   HPI Richard Burgess presents for DM, HTN, CAD  Outpatient Medications Prior to Visit  Medication Sig Dispense Refill   aspirin EC 81 MG tablet Take 81 mg by mouth daily.     Cholecalciferol (VITAMIN D) 50 MCG (2000 UT) tablet Take 2,000 Units by mouth daily with supper.     Cinnamon 500 MG capsule Take 2,000 mg by mouth daily.     cyanocobalamin 500 MCG tablet Take 500 mcg by mouth daily. Vitamin B12     docusate calcium (SURFAK) 240 MG capsule Take 240 mg by mouth at bedtime.     isosorbide mononitrate (IMDUR) 60 MG 24 hr tablet Take 1 tablet (60 mg total) by mouth daily. 90 tablet 3   losartan (COZAAR) 25 MG tablet TAKE 1 TABLET BY MOUTH DAILY 90 tablet 3   melatonin 3 MG TABS tablet Take 3 mg by mouth at bedtime.     metoprolol tartrate (LOPRESSOR) 25 MG tablet Take by mouth.     Multiple Vitamins-Minerals (PRESERVISION AREDS 2) CAPS Take 1 capsule by mouth in the morning and at bedtime.     nitroGLYCERIN (NITROSTAT) 0.4 MG SL tablet Place 1 tablet (0.4 mg total) under the tongue every 5 (five) minutes as needed for chest pain. 20 tablet 1   Polyethyl Glycol-Propyl Glycol (SYSTANE OP) Place 1 drop into both eyes 4 (four) times daily as needed (dry eyes).     REPATHA SURECLICK 856 MG/ML SOAJ INJECT 140 MG INTO THE SKIN EVERY 14 (FOURTEEN) DAYS. 6 mL 3   triamcinolone ointment (KENALOG) 0.1 % Apply 1 application topically 2 (two) times daily. 450 g 3   valACYclovir (VALTREX) 500 MG tablet Take 500 mg by mouth daily.     icosapent Ethyl (VASCEPA) 1 g capsule Take 2 capsules (2 g total) by mouth 2 (two) times daily. (Patient not taking: No sig reported) 120 capsule 3   No facility-administered medications prior to visit.    ROS: Review of Systems  Constitutional:  Negative for appetite change, fatigue and unexpected weight change.  HENT:  Negative for  congestion, nosebleeds, sneezing, sore throat and trouble swallowing.   Eyes:  Negative for itching and visual disturbance.  Respiratory:  Negative for cough.   Cardiovascular:  Negative for chest pain, palpitations and leg swelling.  Gastrointestinal:  Negative for abdominal distention, blood in stool, diarrhea and nausea.  Genitourinary:  Negative for frequency and hematuria.  Musculoskeletal:  Negative for back pain, gait problem, joint swelling and neck pain.  Skin:  Negative for rash.  Neurological:  Negative for dizziness, tremors, speech difficulty and weakness.  Psychiatric/Behavioral:  Negative for agitation, confusion, dysphoric mood and sleep disturbance. The patient is not nervous/anxious.    Objective:  BP 110/62 (BP Location: Left Arm)   Pulse 74   Temp 98.8 F (37.1 C) (Oral)   Ht 5\' 11"  (1.803 m)   Wt 196 lb 9.6 oz (89.2 kg)   SpO2 97%   BMI 27.42 kg/m   BP Readings from Last 3 Encounters:  04/15/21 110/62  01/09/21 120/68  11/26/20 135/66    Wt Readings from Last 3 Encounters:  04/15/21 196 lb 9.6 oz (89.2 kg)  01/09/21 197 lb (89.4 kg)  11/26/20 200 lb (90.7 kg)    Physical Exam Constitutional:      General: He is not in  acute distress.    Appearance: He is well-developed. He is obese.     Comments: NAD  Eyes:     Conjunctiva/sclera: Conjunctivae normal.     Pupils: Pupils are equal, round, and reactive to light.  Neck:     Thyroid: No thyromegaly.     Vascular: No JVD.  Cardiovascular:     Rate and Rhythm: Normal rate and regular rhythm.     Heart sounds: Normal heart sounds. No murmur heard.   No friction rub. No gallop.  Pulmonary:     Effort: Pulmonary effort is normal. No respiratory distress.     Breath sounds: Normal breath sounds. No wheezing or rales.  Chest:     Chest wall: No tenderness.  Abdominal:     General: Bowel sounds are normal. There is no distension.     Palpations: Abdomen is soft. There is no mass.     Tenderness: There  is no abdominal tenderness. There is no guarding or rebound.  Musculoskeletal:        General: Tenderness present. Normal range of motion.     Cervical back: Normal range of motion.  Lymphadenopathy:     Cervical: No cervical adenopathy.  Skin:    General: Skin is warm and dry.     Findings: No rash.  Neurological:     Mental Status: He is alert and oriented to person, place, and time.     Cranial Nerves: No cranial nerve deficit.     Motor: No abnormal muscle tone.     Coordination: Coordination normal.     Gait: Gait normal.     Deep Tendon Reflexes: Reflexes are normal and symmetric.  Psychiatric:        Behavior: Behavior normal.        Thought Content: Thought content normal.        Judgment: Judgment normal.  LS stiff  Lab Results  Component Value Date   WBC 7.9 04/12/2021   HGB 13.6 04/12/2021   HCT 40.3 04/12/2021   PLT 160.0 04/12/2021   GLUCOSE 124 (H) 04/12/2021   CHOL 108 04/12/2021   TRIG 109.0 04/12/2021   HDL 43.40 04/12/2021   LDLDIRECT 153.0 02/18/2016   LDLCALC 43 04/12/2021   ALT 17 04/12/2021   AST 19 04/12/2021   NA 139 04/12/2021   K 4.1 04/12/2021   CL 105 04/12/2021   CREATININE 0.96 04/12/2021   BUN 22 04/12/2021   CO2 28 04/12/2021   TSH 3.29 04/12/2021   PSA 0.28 03/23/2014   INR 1.0 04/20/2020   HGBA1C 7.0 (H) 04/12/2021   MICROALBUR 65 12/26/2020    VAS US CAROTID  Result Date: 11/26/2020 Carotid Arterial Duplex Study Patient Name:  Richard Burgess  Date of Exam:   11/26/2020 Medical Rec #: [id number]        Accession #:    [id number] Date of Birth: Sep 07, 1928         Patient Gender: M Patient Age:   89Y Exam [facility] Procedure:      VAS US CAROTID Referring Phys: [id number] Richard Burgess [facility] --------------------------------------------------------------------------------  Indications:       Right stent and TCAR 04/25/2020 . Risk Factors:      Hypertension, hyperlipidemia, PAD. Comparison Study:  Increased right ECA  velocity since prior exam of 05/28/2020. Performing Technologist: [facility] RVT  Examination Guidelines: A complete evaluation includes B-mode imaging, spectral Doppler, color Doppler, and power Doppler as needed of all accessible portions of  each vessel. Bilateral testing is considered an integral part of a complete examination. Limited examinations for reoccurring indications may be performed as noted.  Right Carotid Findings: +----------+--------+--------+--------+------------------+--------+           PSV cm/sEDV cm/sStenosisPlaque DescriptionComments +----------+--------+--------+--------+------------------+--------+ CCA Prox  69      13              heterogenous               +----------+--------+--------+--------+------------------+--------+ CCA Mid   74      15              heterogenous               +----------+--------+--------+--------+------------------+--------+ CCA Distal73      16                                         +----------+--------+--------+--------+------------------+--------+ ICA Prox                                            stent    +----------+--------+--------+--------+------------------+--------+ ICA Mid   97      22                                         +----------+--------+--------+--------+------------------+--------+ ICA Distal52      16                                         +----------+--------+--------+--------+------------------+--------+ ECA       395     36      >50%                               +----------+--------+--------+--------+------------------+--------+ +----------+--------+-------+--------+-------------------+           PSV cm/sEDV cmsDescribeArm Pressure (mmHG) +----------+--------+-------+--------+-------------------+ [id number]     6      Stenotic                    +----------+--------+-------+--------+-------------------+ +---------+--------+--+--------+-+----------------------+  VertebralPSV cm/s34EDV cm/s3Antegrade and Atypical +---------+--------+--+--------+-+----------------------+  Right Stent(s): +---------------+---+--++++ Prox to Stent  11233 +---------------+---+--++++ Proximal Stent 91 20 +---------------+---+--++++ Mid Stent      10621 +---------------+---+--++++ Distal Stent   10123 +---------------+---+--++++ Distal to Stent92 20 +---------------+---+--++++   Left Carotid Findings: +----------+--------+--------+--------+------------------+--------+           PSV cm/sEDV cm/sStenosisPlaque DescriptionComments +----------+--------+--------+--------+------------------+--------+ CCA Prox  79      10              heterogenous               +----------+--------+--------+--------+------------------+--------+ CCA Mid   80      12                                         +----------+--------+--------+--------+------------------+--------+ CCA Distal70      11                                         +----------+--------+--------+--------+------------------+--------+  ICA Prox  56      17      1-39%                              +----------+--------+--------+--------+------------------+--------+ ICA Mid   59      19                                         +----------+--------+--------+--------+------------------+--------+ ICA Distal53      16                                         +----------+--------+--------+--------+------------------+--------+ ECA       101     12                                         +----------+--------+--------+--------+------------------+--------+ +----------+--------+--------+----------------+-------------------+           PSV cm/sEDV cm/sDescribe        Arm Pressure (mmHG) +----------+--------+--------+----------------+-------------------+ [id number]     8       Multiphasic, WNL                     +----------+--------+--------+----------------+-------------------+ +---------+--------+--+--------+-+---------+ VertebralPSV cm/s36EDV cm/s8Antegrade +---------+--------+--+--------+-+---------+   Summary: Right Carotid: The ECA appears >50% stenosed. Patent stent with no evidence for                restenosis. Left Carotid: Velocities in the left ICA are consistent with a 1-39% stenosis. Vertebrals:  Bilateral vertebral arteries demonstrate antegrade flow, atypical              right vertebral artery waveform. Subclavians: Right subclavian artery was stenotic. Normal flow hemodynamics were              seen in the left subclavian artery. *See table(s) above for measurements and observations.  Electronically signed by Harold Barban MD on 11/26/2020 at 1:52:01 PM.    Final     Assessment & Plan:   Problem List Items Addressed This Visit     Arteriosclerosis of coronary artery    Monitoring clinically      Diabetes mellitus type 2 in obese ([place])    Pt refused tablets On Apple cider and Cinnamon supplements      Essential hypertension    Cont on Losartan, Toprol, Isosorbide      Hyperglycemia - Primary   Relevant Orders   Basic metabolic panel   Hemoglobin A1c      No orders of the defined types were placed in this encounter.     Follow-up: Return in about 3 months (around 07/16/2021) for a follow-up visit.  [name], MD

## 2021-05-06 ENCOUNTER — Other Ambulatory Visit: Payer: Self-pay | Admitting: Cardiovascular Disease

## 2021-06-20 ENCOUNTER — Telehealth: Payer: PPO
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
## 2021-07-11 DIAGNOSIS — M159 Polyosteoarthritis, unspecified: Secondary | ICD-10-CM | POA: Insufficient documentation

## 2021-07-12 ENCOUNTER — Other Ambulatory Visit (INDEPENDENT_AMBULATORY_CARE_PROVIDER_SITE_OTHER): Payer: PPO

## 2021-07-12 ENCOUNTER — Other Ambulatory Visit: Payer: Self-pay

## 2021-07-12 DIAGNOSIS — R739 Hyperglycemia, unspecified: Secondary | ICD-10-CM | POA: Diagnosis not present

## 2021-07-12 LAB — BASIC METABOLIC PANEL
BUN: 19 mg/dL (ref 6–23)
CO2: 30 mEq/L (ref 19–32)
Calcium: 9.6 mg/dL (ref 8.4–10.5)
Chloride: 105 mEq/L (ref 96–112)
Creatinine, Ser: 1.07 mg/dL (ref 0.40–1.50)
GFR: 61.06 mL/min (ref >60.00)
Glucose, Bld: 134 mg/dL — ABNORMAL HIGH (ref 70–99)
Potassium: 4.5 mEq/L (ref 3.5–5.1)
Sodium: 139 mEq/L (ref 135–145)

## 2021-07-12 LAB — HEMOGLOBIN A1C: Hgb A1c MFr Bld: 6.9 % — ABNORMAL HIGH (ref 4.6–6.5)

## 2021-07-16 ENCOUNTER — Other Ambulatory Visit: Payer: Self-pay

## 2021-07-16 ENCOUNTER — Encounter (HOSPITAL_BASED_OUTPATIENT_CLINIC_OR_DEPARTMENT_OTHER): Payer: Self-pay | Admitting: Cardiovascular Disease

## 2021-07-16 ENCOUNTER — Ambulatory Visit (INDEPENDENT_AMBULATORY_CARE_PROVIDER_SITE_OTHER): Payer: PPO | Admitting: Internal Medicine

## 2021-07-16 ENCOUNTER — Encounter: Payer: Self-pay | Admitting: Internal Medicine

## 2021-07-16 VITALS — BP 142/74 | HR 79 | Temp 98.4°F | Ht 71.0 in | Wt 199.6 lb

## 2021-07-16 DIAGNOSIS — E1169 Type 2 diabetes mellitus with other specified complication: Secondary | ICD-10-CM

## 2021-07-16 DIAGNOSIS — I10 Essential (primary) hypertension: Secondary | ICD-10-CM | POA: Diagnosis not present

## 2021-07-16 DIAGNOSIS — E669 Obesity, unspecified: Secondary | ICD-10-CM | POA: Diagnosis not present

## 2021-07-16 DIAGNOSIS — M5440 Lumbago with sciatica, unspecified side: Secondary | ICD-10-CM

## 2021-07-16 DIAGNOSIS — G8929 Other chronic pain: Secondary | ICD-10-CM | POA: Diagnosis not present

## 2021-07-16 DIAGNOSIS — E538 Deficiency of other specified B group vitamins: Secondary | ICD-10-CM | POA: Diagnosis not present

## 2021-07-16 NOTE — Assessment & Plan Note (Signed)
On B12 

## 2021-07-16 NOTE — Progress Notes (Signed)
Subjective:  Patient ID: Richard Burgess, male    DOB: 1929-02-23  Age: 88 y.o. MRN: [id number]  CC: Follow-up   HPI Richard Burgess presents for DM, HTN, B12 def Pt gained wt Using a scooter  Outpatient Medications Prior to Visit  Medication Sig Dispense Refill   aspirin EC 81 MG tablet Take 81 mg by mouth daily.     Cholecalciferol (VITAMIN D) 50 MCG (2000 UT) tablet Take 2,000 Units by mouth daily with supper.     Cinnamon 500 MG capsule Take 2,000 mg by mouth daily.     cyanocobalamin 500 MCG tablet Take 500 mcg by mouth daily. Vitamin B12     docusate calcium (SURFAK) 240 MG capsule Take 240 mg by mouth at bedtime.     Evolocumab (REPATHA SURECLICK) 734 MG/ML SOAJ Inject 140 mg into the skin every 14 (fourteen) days. Pt. Is overdue for followup. Please schedule appointment to ensure future refills 1 mL 3   isosorbide mononitrate (IMDUR) 60 MG 24 hr tablet Take 1 tablet (60 mg total) by mouth daily. 90 tablet 3   losartan (COZAAR) 25 MG tablet TAKE 1 TABLET BY MOUTH DAILY 90 tablet 3   melatonin 3 MG TABS tablet Take 3 mg by mouth at bedtime.     Multiple Vitamins-Minerals (PRESERVISION AREDS 2) CAPS Take 1 capsule by mouth in the morning and at bedtime.     nitroGLYCERIN (NITROSTAT) 0.4 MG SL tablet Place 1 tablet (0.4 mg total) under the tongue every 5 (five) minutes as needed for chest pain. 20 tablet 1   Polyethyl Glycol-Propyl Glycol (SYSTANE OP) Place 1 drop into both eyes 4 (four) times daily as needed (dry eyes).     triamcinolone ointment (KENALOG) 0.1 % Apply 1 application topically 2 (two) times daily. 450 g 3   valACYclovir (VALTREX) 500 MG tablet Take 500 mg by mouth daily.     icosapent Ethyl (VASCEPA) 1 g capsule Take 2 capsules (2 g total) by mouth 2 (two) times daily. (Patient not taking: Reported on 07/16/2021) 120 capsule 3   metoprolol tartrate (LOPRESSOR) 25 MG tablet Take by mouth. (Patient not taking: Reported on 07/16/2021)     No facility-administered  medications prior to visit.    ROS: Review of Systems  Constitutional:  Negative for appetite change, fatigue and unexpected weight change.  HENT:  Negative for congestion, nosebleeds, sneezing, sore throat and trouble swallowing.   Eyes:  Negative for itching and visual disturbance.  Respiratory:  Negative for cough.   Cardiovascular:  Negative for chest pain, palpitations and leg swelling.  Gastrointestinal:  Negative for abdominal distention, blood in stool, diarrhea and nausea.  Genitourinary:  Negative for frequency and hematuria.  Musculoskeletal:  Positive for gait problem. Negative for back pain, joint swelling and neck pain.  Skin:  Negative for rash.  Neurological:  Negative for dizziness, tremors, speech difficulty and weakness.  Psychiatric/Behavioral:  Negative for agitation, dysphoric mood and sleep disturbance. The patient is nervous/anxious.    Objective:  BP (!) 142/74 (BP Location: Left Arm, Patient Position: Sitting, Cuff Size: Normal)    Pulse 79    Temp 98.4 F (36.9 C) (Oral)    Ht 5\' 11"  (1.803 m)    Wt 199 lb 9.6 oz (90.5 kg)    SpO2 98%    BMI 27.84 kg/m   BP Readings from Last 3 Encounters:  07/16/21 (!) 142/74  04/15/21 110/62  01/09/21 120/68    Wt Readings from Last 3  Encounters:  07/16/21 199 lb 9.6 oz (90.5 kg)  04/15/21 196 lb 9.6 oz (89.2 kg)  01/09/21 197 lb (89.4 kg)    Physical Exam Constitutional:      General: He is not in acute distress.    Appearance: He is well-developed. He is obese.     Comments: NAD  Eyes:     Conjunctiva/sclera: Conjunctivae normal.     Pupils: Pupils are equal, round, and reactive to light.  Neck:     Thyroid: No thyromegaly.     Vascular: No JVD.  Cardiovascular:     Rate and Rhythm: Normal rate and regular rhythm.     Heart sounds: Normal heart sounds. No murmur heard.   No friction rub. No gallop.  Pulmonary:     Effort: Pulmonary effort is normal. No respiratory distress.     Breath sounds: Normal  breath sounds. No wheezing or rales.  Chest:     Chest wall: No tenderness.  Abdominal:     General: Bowel sounds are normal. There is no distension.     Palpations: Abdomen is soft. There is no mass.     Tenderness: There is no abdominal tenderness. There is no guarding or rebound.  Musculoskeletal:        General: No tenderness. Normal range of motion.     Cervical back: Normal range of motion.  Lymphadenopathy:     Cervical: No cervical adenopathy.  Skin:    General: Skin is warm and dry.     Findings: No rash.  Neurological:     Mental Status: He is alert and oriented to person, place, and time.     Cranial Nerves: No cranial nerve deficit.     Motor: No abnormal muscle tone.     Coordination: Coordination abnormal.     Gait: Gait abnormal.     Deep Tendon Reflexes: Reflexes are normal and symmetric.  Psychiatric:        Behavior: Behavior normal.        Thought Content: Thought content normal.        Judgment: Judgment normal.  Antalgic   Lab Results  Component Value Date   WBC 7.9 04/12/2021   HGB 13.6 04/12/2021   HCT 40.3 04/12/2021   PLT 160.0 04/12/2021   GLUCOSE 134 (H) 07/12/2021   CHOL 108 04/12/2021   TRIG 109.0 04/12/2021   HDL 43.40 04/12/2021   LDLDIRECT 153.0 02/18/2016   LDLCALC 43 04/12/2021   ALT 17 04/12/2021   AST 19 04/12/2021   NA 139 07/12/2021   K 4.5 07/12/2021   CL 105 07/12/2021   CREATININE 1.07 07/12/2021   BUN 19 07/12/2021   CO2 30 07/12/2021   TSH 3.29 04/12/2021   PSA 0.28 03/23/2014   INR 1.0 04/20/2020   HGBA1C 6.9 (H) 07/12/2021   MICROALBUR 65 12/26/2020    VAS US CAROTID  Result Date: 11/26/2020 Carotid Arterial Duplex Study Patient Name:  Richard Burgess  Date of Exam:   11/26/2020 Medical Rec #: [id number]        Accession #:    [id number] Date of Birth: [birth year]         Patient Gender: M Patient Age:   89Y Exam Location:  [facility] Procedure:      VAS US CAROTID Referring Phys: [id number]  [facility] --------------------------------------------------------------------------------  Indications:       Right stent and TCAR 04/25/2020 . Risk Factors:      Hypertension, hyperlipidemia, PAD. Comparison  Study:  Increased right ECA velocity since prior exam of 05/28/2020. Performing Technologist: Alvia Grove RVT  Examination Guidelines: A complete evaluation includes B-mode imaging, spectral Doppler, color Doppler, and power Doppler as needed of all accessible portions of each vessel. Bilateral testing is considered an integral part of a complete examination. Limited examinations for reoccurring indications may be performed as noted.  Right Carotid Findings: +----------+--------+--------+--------+------------------+--------+             PSV cm/s EDV cm/s Stenosis Plaque Description Comments  +----------+--------+--------+--------+------------------+--------+  CCA Prox   69       13                heterogenous                 +----------+--------+--------+--------+------------------+--------+  CCA Mid    74       15                heterogenous                 +----------+--------+--------+--------+------------------+--------+  CCA Distal 73       16                                             +----------+--------+--------+--------+------------------+--------+  ICA Prox                                                 stent     +----------+--------+--------+--------+------------------+--------+  ICA Mid    97       22                                             +----------+--------+--------+--------+------------------+--------+  ICA Distal 52       16                                             +----------+--------+--------+--------+------------------+--------+  ECA        395      36       >50%                                  +----------+--------+--------+--------+------------------+--------+ +----------+--------+-------+--------+-------------------+             PSV cm/s EDV cms Describe Arm Pressure (mmHG)   +----------+--------+-------+--------+-------------------+  Subclavian 278      6       Stenotic                      +----------+--------+-------+--------+-------------------+ +---------+--------+--+--------+-+----------------------+  Vertebral PSV cm/s 34 EDV cm/s 3 Antegrade and Atypical  +---------+--------+--+--------+-+----------------------+  Right Stent(s): +---------------+---+--++++  Prox to Stent   112 33     +---------------+---+--++++  Proximal Stent  91  20     +---------------+---+--++++  Mid Stent       106 21     +---------------+---+--++++  Distal Stent    101 23     +---------------+---+--++++  Distal to Stent 92  20     +---------------+---+--++++   Left Carotid Findings: +----------+--------+--------+--------+------------------+--------+             PSV cm/s EDV cm/s Stenosis Plaque Description Comments  +----------+--------+--------+--------+------------------+--------+  CCA Prox   79       10                heterogenous                 +----------+--------+--------+--------+------------------+--------+  CCA Mid    80       12                                             +----------+--------+--------+--------+------------------+--------+  CCA Distal 70       11                                             +----------+--------+--------+--------+------------------+--------+  ICA Prox   56       17       1-39%                                 +----------+--------+--------+--------+------------------+--------+  ICA Mid    59       19                                             +----------+--------+--------+--------+------------------+--------+  ICA Distal 53       16                                             +----------+--------+--------+--------+------------------+--------+  ECA        101      12                                             +----------+--------+--------+--------+------------------+--------+ +----------+--------+--------+----------------+-------------------+             PSV cm/s EDV  cm/s Describe         Arm Pressure (mmHG)  +----------+--------+--------+----------------+-------------------+  Subclavian 193      8        Multiphasic, WNL                      +----------+--------+--------+----------------+-------------------+ +---------+--------+--+--------+-+---------+  Vertebral PSV cm/s 36 EDV cm/s 8 Antegrade  +---------+--------+--+--------+-+---------+   Summary: Right Carotid: The ECA appears >50% stenosed. Patent stent with no evidence for                restenosis. Left Carotid: Velocities in the left ICA are consistent with a 1-39% stenosis. Vertebrals:  Bilateral vertebral arteries demonstrate antegrade flow, atypical              right vertebral artery waveform. Subclavians: Right subclavian artery was stenotic. Normal flow hemodynamics were  seen in the left subclavian artery. *See table(s) above for measurements and observations.  Electronically signed by Harold Barban MD on 11/26/2020 at 1:52:01 PM.    Final     Assessment & Plan:   Problem List Items Addressed This Visit     B12 deficiency    On B12      Diabetes mellitus type 2 in obese ([place]) - Primary    Pt refused tablets Start Apple cider 15 ml in water and cont on Cinnamon supplements Check labs      Relevant Orders   Comprehensive metabolic panel   Hemoglobin A1c   Essential hypertension    Cont on Losartan, Isosorbide NAS diet      LOW BACK PAIN    Using a scooter         No orders of the defined types were placed in this encounter.     Follow-up: No follow-ups on file.  [name], MD

## 2021-07-16 NOTE — Assessment & Plan Note (Signed)
Cont on Losartan, Isosorbide NAS diet

## 2021-07-16 NOTE — Assessment & Plan Note (Signed)
Using a scooter

## 2021-07-16 NOTE — Assessment & Plan Note (Signed)
Pt refused tablets Start Apple cider 15 ml in water and cont on Cinnamon supplements Check labs

## 2021-07-22 ENCOUNTER — Encounter: Payer: Self-pay | Admitting: Internal Medicine

## 2021-08-01 DIAGNOSIS — I2510 Atherosclerotic heart disease of native coronary artery without angina pectoris: Secondary | ICD-10-CM | POA: Diagnosis not present

## 2021-08-01 DIAGNOSIS — H02135 Senile ectropion of left lower eyelid: Secondary | ICD-10-CM | POA: Diagnosis not present

## 2021-08-02 ENCOUNTER — Other Ambulatory Visit: Payer: Self-pay | Admitting: Cardiovascular Disease

## 2021-08-09 DIAGNOSIS — T162XXA Foreign body in left ear, initial encounter: Secondary | ICD-10-CM | POA: Diagnosis not present

## 2021-08-09 DIAGNOSIS — Z87891 Personal history of nicotine dependence: Secondary | ICD-10-CM | POA: Diagnosis not present

## 2021-09-18 ENCOUNTER — Ambulatory Visit: Payer: PPO | Admitting: Internal Medicine

## 2021-09-27 ENCOUNTER — Telehealth: Payer: Self-pay

## 2021-09-27 NOTE — Progress Notes (Signed)
? ? ?  Chronic Care Management ?Pharmacy Assistant  ? ?Name: Richard Burgess  MRN: [id number] DOB: [birth year] ? ? ?Reason for Encounter: Disease State-General ?  ? ?Recent office visits:  ?07/16/21 Plotnikov, Evie Lacks, MD-PCP (Diabetes Mellitus) Labs ordered; No med changes ? ?04/15/21 Plotnikov, Evie Lacks, MD-PCP (Hyperglycemia) Labs ordered; No med changes ? ?Recent consult visits:  ?None since last coordination call ? ?Hospital visits:  ?None in previous 6 months ? ?Medications: ?Outpatient Encounter Medications as of 09/27/2021  ?Medication Sig  ? aspirin EC 81 MG tablet Take 81 mg by mouth daily.  ? Cholecalciferol (VITAMIN D) 50 MCG (2000 UT) tablet Take 2,000 Units by mouth daily with supper.  ? Cinnamon 500 MG capsule Take 2,000 mg by mouth daily.  ? cyanocobalamin 500 MCG tablet Take 500 mcg by mouth daily. Vitamin B12  ? docusate calcium (SURFAK) 240 MG capsule Take 240 mg by mouth at bedtime.  ? icosapent Ethyl (VASCEPA) 1 g capsule Take 2 capsules (2 g total) by mouth 2 (two) times daily. (Patient not taking: Reported on 07/16/2021)  ? isosorbide mononitrate (IMDUR) 60 MG 24 hr tablet Take 1 tablet (60 mg total) by mouth daily.  ? losartan (COZAAR) 25 MG tablet TAKE 1 TABLET BY MOUTH DAILY  ? melatonin 3 MG TABS tablet Take 3 mg by mouth at bedtime.  ? Multiple Vitamins-Minerals (PRESERVISION AREDS 2) CAPS Take 1 capsule by mouth in the morning and at bedtime.  ? nitroGLYCERIN (NITROSTAT) 0.4 MG SL tablet Place 1 tablet (0.4 mg total) under the tongue every 5 (five) minutes as needed for chest pain.  ? Polyethyl Glycol-Propyl Glycol (SYSTANE OP) Place 1 drop into both eyes 4 (four) times daily as needed (dry eyes).  ? REPATHA SURECLICK 601 MG/ML SOAJ INJECT 140 MG INTO THE SKIN EVERY 14 (FOURTEEN) DAYS. PT. IS OVERDUE FOR FOLLOWUP.  ? triamcinolone ointment (KENALOG) 0.1 % Apply 1 application topically 2 (two) times daily.  ? valACYclovir (VALTREX) 500 MG tablet Take 500 mg by mouth daily.  ? ?No  facility-administered encounter medications on file as of 09/27/2021.  ? ?Have you had any problems recently with your health?Spoke with daughter [name] who states that the patient is doing really well today.  ? ?Have you had any problems with your pharmacy?She states that patient gets his medications from the [place] and does not have any problems with getting medications or the cost of medications ? ?What issues or side effects are you having with your medications?She states that the patient does not have any side effects that she knows of. ? ?What would you like me to pass along to Jone[facility] Boers for them to help you with? She states that the patient is doing well ? ?What can we do to take care of you better? She states that patient does not need anything at this time ? ?Care Gaps: ?Colonoscopy-NA ?Diabetic Foot Exam-NA ?Ophthalmology-07/11/21 ?Dexa Scan - A ?Annual Well Visit - NA ?Micro albumin-12/26/20 ?Hemoglobin A1c- 07/12/21 ? ?Star Rating Drugs: ?Losartan 25 mg-last fill 12/31/20 90 ds ? ?Ethelene Hal ?Clinical Pharmacist Assistant ?[phone]  ?

## 2021-10-16 ENCOUNTER — Encounter: Payer: Self-pay | Admitting: Internal Medicine

## 2021-10-16 ENCOUNTER — Ambulatory Visit (INDEPENDENT_AMBULATORY_CARE_PROVIDER_SITE_OTHER): Payer: PPO | Admitting: Internal Medicine

## 2021-10-16 DIAGNOSIS — I739 Peripheral vascular disease, unspecified: Secondary | ICD-10-CM | POA: Diagnosis not present

## 2021-10-16 DIAGNOSIS — E538 Deficiency of other specified B group vitamins: Secondary | ICD-10-CM

## 2021-10-16 DIAGNOSIS — E1169 Type 2 diabetes mellitus with other specified complication: Secondary | ICD-10-CM | POA: Diagnosis not present

## 2021-10-16 DIAGNOSIS — M5440 Lumbago with sciatica, unspecified side: Secondary | ICD-10-CM | POA: Diagnosis not present

## 2021-10-16 DIAGNOSIS — M19079 Primary osteoarthritis, unspecified ankle and foot: Secondary | ICD-10-CM | POA: Insufficient documentation

## 2021-10-16 DIAGNOSIS — J209 Acute bronchitis, unspecified: Secondary | ICD-10-CM | POA: Insufficient documentation

## 2021-10-16 DIAGNOSIS — G8929 Other chronic pain: Secondary | ICD-10-CM | POA: Diagnosis not present

## 2021-10-16 DIAGNOSIS — M170 Bilateral primary osteoarthritis of knee: Secondary | ICD-10-CM | POA: Insufficient documentation

## 2021-10-16 DIAGNOSIS — H02889 Meibomian gland dysfunction of unspecified eye, unspecified eyelid: Secondary | ICD-10-CM | POA: Insufficient documentation

## 2021-10-16 DIAGNOSIS — Z461 Encounter for fitting and adjustment of hearing aid: Secondary | ICD-10-CM | POA: Insufficient documentation

## 2021-10-16 DIAGNOSIS — H903 Sensorineural hearing loss, bilateral: Secondary | ICD-10-CM | POA: Insufficient documentation

## 2021-10-16 DIAGNOSIS — H04123 Dry eye syndrome of bilateral lacrimal glands: Secondary | ICD-10-CM | POA: Insufficient documentation

## 2021-10-16 DIAGNOSIS — E669 Obesity, unspecified: Secondary | ICD-10-CM

## 2021-10-16 DIAGNOSIS — M1710 Unilateral primary osteoarthritis, unspecified knee: Secondary | ICD-10-CM | POA: Diagnosis not present

## 2021-10-16 DIAGNOSIS — H6093 Unspecified otitis externa, bilateral: Secondary | ICD-10-CM | POA: Insufficient documentation

## 2021-10-16 DIAGNOSIS — F528 Other sexual dysfunction not due to a substance or known physiological condition: Secondary | ICD-10-CM | POA: Insufficient documentation

## 2021-10-16 LAB — COMPREHENSIVE METABOLIC PANEL
ALT: 16 U/L (ref 0–53)
AST: 19 U/L (ref 0–37)
Albumin: 4.1 g/dL (ref 3.5–5.2)
Alkaline Phosphatase: 78 U/L (ref 39–117)
BUN: 15 mg/dL (ref 6–23)
CO2: 25 mEq/L (ref 19–32)
Calcium: 9.5 mg/dL (ref 8.4–10.5)
Chloride: 105 mEq/L (ref 96–112)
Creatinine, Ser: 0.99 mg/dL (ref 0.40–1.50)
GFR: 66.9 mL/min (ref >60.00)
Glucose, Bld: 117 mg/dL — ABNORMAL HIGH (ref 70–99)
Potassium: 4.1 mEq/L (ref 3.5–5.1)
Sodium: 137 mEq/L (ref 135–145)
Total Bilirubin: 0.8 mg/dL (ref 0.2–1.2)
Total Protein: 7.1 g/dL (ref 6.0–8.3)

## 2021-10-16 LAB — HEMOGLOBIN A1C: Hgb A1c MFr Bld: 6.8 % — ABNORMAL HIGH (ref 4.6–6.5)

## 2021-10-16 NOTE — Assessment & Plan Note (Signed)
Cont w/ Losartan, ASA ?On Repatha 2019 ?

## 2021-10-16 NOTE — Assessment & Plan Note (Signed)
Blue-Emu cream was recommended to use 2-3 times a day ? ?

## 2021-10-16 NOTE — Progress Notes (Signed)
? ?Subjective:  ?Patient ID: Richard Burgess, male    DOB: [birth year]  Age: 88 y.o. MRN: [id number] ? ?CC: No chief complaint on file. ? ? ?HPI ?Richard Burgess presents for CAD, DM, OA, dyslipidemia ?Pt had swollen knees 1 month ago - fluid was removed in [place] ?Working in the garden 8 h/d ? ?Outpatient Medications Prior to Visit  ?Medication Sig Dispense Refill  ? aspirin EC 81 MG tablet Take 81 mg by mouth daily.    ? Cholecalciferol (VITAMIN D) 50 MCG (2000 UT) tablet Take 2,000 Units by mouth daily with supper.    ? Cinnamon 500 MG capsule Take 2,000 mg by mouth daily.    ? cyanocobalamin 500 MCG tablet Take 500 mcg by mouth daily. Vitamin B12    ? docusate calcium (SURFAK) 240 MG capsule Take 240 mg by mouth at bedtime.    ? isosorbide mononitrate (IMDUR) 60 MG 24 hr tablet Take 1 tablet (60 mg total) by mouth daily. 90 tablet 3  ? losartan (COZAAR) 25 MG tablet TAKE 1 TABLET BY MOUTH DAILY 90 tablet 3  ? melatonin 3 MG TABS tablet Take 3 mg by mouth at bedtime.    ? Multiple Vitamins-Minerals (PRESERVISION AREDS 2) CAPS Take 1 capsule by mouth in the morning and at bedtime.    ? nitroGLYCERIN (NITROSTAT) 0.4 MG SL tablet Place 1 tablet (0.4 mg total) under the tongue every 5 (five) minutes as needed for chest pain. 20 tablet 1  ? Polyethyl Glycol-Propyl Glycol (SYSTANE OP) Place 1 drop into both eyes 4 (four) times daily as needed (dry eyes).    ? REPATHA SURECLICK 517 MG/ML SOAJ INJECT 140 MG INTO THE SKIN EVERY 14 (FOURTEEN) DAYS. PT. IS OVERDUE FOR FOLLOWUP. 6 mL 3  ? triamcinolone ointment (KENALOG) 0.1 % Apply 1 application topically 2 (two) times daily. 450 g 3  ? valACYclovir (VALTREX) 500 MG tablet Take 500 mg by mouth daily.    ? icosapent Ethyl (VASCEPA) 1 g capsule Take 2 capsules (2 g total) by mouth 2 (two) times daily. (Patient not taking: Reported on 07/16/2021) 120 capsule 3  ? ?No facility-administered medications prior to visit.  ? ? ?ROS: ?Review of Systems  ?Constitutional:  Negative for  appetite change, fatigue and unexpected weight change.  ?HENT:  Negative for congestion, nosebleeds, sneezing, sore throat and trouble swallowing.   ?Eyes:  Negative for itching and visual disturbance.  ?Respiratory:  Negative for cough.   ?Cardiovascular:  Negative for chest pain, palpitations and leg swelling.  ?Gastrointestinal:  Negative for abdominal distention, blood in stool, diarrhea and nausea.  ?Genitourinary:  Negative for frequency and hematuria.  ?Musculoskeletal:  Positive for gait problem. Negative for arthralgias, back pain, joint swelling and neck pain.  ?Skin:  Negative for rash.  ?Neurological:  Negative for dizziness, tremors, speech difficulty and weakness.  ?Psychiatric/Behavioral:  Negative for agitation, dysphoric mood and sleep disturbance. The patient is not nervous/anxious.   ? ?Objective:  ?BP 110/60 (BP Location: Left Arm, Patient Position: Sitting, Cuff Size: Normal)   Pulse 68   Temp 98.5 ?F (36.9 ?C) (Oral)   Ht '5\' 11"'$  (1.803 m)   Wt 189 lb (85.7 kg)   SpO2 97%   BMI 26.36 kg/m?  ? ?BP Readings from Last 3 Encounters:  ?10/16/21 110/60  ?07/16/21 (!) 142/74  ?04/15/21 110/62  ? ? ?Wt Readings from Last 3 Encounters:  ?10/16/21 189 lb (85.7 kg)  ?07/16/21 199 lb 9.6 oz (90.5 kg)  ?04/15/21  196 lb 9.6 oz (89.2 kg)  ? ? ?Physical Exam ?Constitutional:   ?   General: He is not in acute distress. ?   Appearance: He is well-developed. He is obese.  ?   Comments: NAD  ?Eyes:  ?   Conjunctiva/sclera: Conjunctivae normal.  ?   Pupils: Pupils are equal, round, and reactive to light.  ?Neck:  ?   Thyroid: No thyromegaly.  ?   Vascular: No JVD.  ?Cardiovascular:  ?   Rate and Rhythm: Normal rate and regular rhythm.  ?   Heart sounds: Normal heart sounds. No murmur heard. ?  No friction rub. No gallop.  ?Pulmonary:  ?   Effort: Pulmonary effort is normal. No respiratory distress.  ?   Breath sounds: Normal breath sounds. No wheezing or rales.  ?Chest:  ?   Chest wall: No tenderness.   ?Abdominal:  ?   General: Bowel sounds are normal. There is no distension.  ?   Palpations: Abdomen is soft. There is no mass.  ?   Tenderness: There is no abdominal tenderness. There is no guarding or rebound.  ?Musculoskeletal:     ?   General: No tenderness. Normal range of motion.  ?   Cervical back: Normal range of motion.  ?Lymphadenopathy:  ?   Cervical: No cervical adenopathy.  ?Skin: ?   General: Skin is warm and dry.  ?   Findings: No rash.  ?Neurological:  ?   Mental Status: He is alert and oriented to person, place, and time.  ?   Cranial Nerves: No cranial nerve deficit.  ?   Motor: No abnormal muscle tone.  ?   Coordination: Coordination normal.  ?   Gait: Gait normal.  ?   Deep Tendon Reflexes: Reflexes are normal and symmetric.  ?Psychiatric:     ?   Behavior: Behavior normal.     ?   Thought Content: Thought content normal.     ?   Judgment: Judgment normal.  ? ? ?Lab Results  ?Component Value Date  ? WBC 7.9 04/12/2021  ? HGB 13.6 04/12/2021  ? HCT 40.3 04/12/2021  ? PLT 160.0 04/12/2021  ? GLUCOSE 134 (H) 07/12/2021  ? CHOL 108 04/12/2021  ? TRIG 109.0 04/12/2021  ? HDL 43.40 04/12/2021  ? LDLDIRECT 153.0 02/18/2016  ? LDLCALC 43 04/12/2021  ? ALT 17 04/12/2021  ? AST 19 04/12/2021  ? NA 139 07/12/2021  ? K 4.5 07/12/2021  ? CL 105 07/12/2021  ? CREATININE 1.07 07/12/2021  ? BUN 19 07/12/2021  ? CO2 30 07/12/2021  ? TSH 3.29 04/12/2021  ? PSA 0.28 03/23/2014  ? INR 1.0 04/20/2020  ? HGBA1C 6.9 (H) 07/12/2021  ? MICROALBUR 65 12/26/2020  ? ? ?VAS US CAROTID ? ?Result Date: 11/26/2020 ?Carotid Arterial Duplex Study Patient Name:  Richard Burgess  Date of Exam:   11/26/2020 Medical Rec #: [id number]        Accession #:    [id number] Date of Birth: 04-09-1929         Patient Gender: M Patient Age:   89Y Exam Location:  [facility] Procedure:      VAS US CAROTID Referring Phys: [id number] [facility] --------------------------------------------------------------------------------   Indications:       Right stent and TCAR 04/25/2020 . Risk Factors:      Hypertension, hyperlipidemia, PAD. Comparison Study:  Increased right ECA velocity since prior exam of 05/28/2020. Performing Technologist: Alvia Grove RVT  Examination  Guidelines: A complete evaluation includes B-mode imaging, spectral Doppler, color Doppler, and power Doppler as needed of all accessible portions of each vessel. Bilateral testing is considered an integral part of a complete examination. Limited examinations for reoccurring indications may be performed as noted.  Right Carotid Findings: +----------+--------+--------+--------+------------------+--------+           PSV cm/sEDV cm/sStenosisPlaque DescriptionComments +----------+--------+--------+--------+------------------+--------+ CCA Prox  69      13              heterogenous               +----------+--------+--------+--------+------------------+--------+ CCA Mid   74      15              heterogenous               +----------+--------+--------+--------+------------------+--------+ CCA Distal73      16                                         +----------+--------+--------+--------+------------------+--------+ ICA Prox                                            stent    +----------+--------+--------+--------+------------------+--------+ ICA Mid   97      22                                         +----------+--------+--------+--------+------------------+--------+ ICA Distal52      16                                         +----------+--------+--------+--------+------------------+--------+ ECA       395     36      >50%                               +----------+--------+--------+--------+------------------+--------+ +----------+--------+-------+--------+-------------------+           PSV cm/sEDV cmsDescribeArm Pressure (mmHG) +----------+--------+-------+--------+-------------------+ [id number]     6       Stenotic                    +----------+--------+-------+--------+-------------------+ +---------+--------+--+--------+-+----------------------+ VertebralPSV cm/s34EDV cm/s3Antegrade and Atypical +---------+--------+--+--------+-+----------------------+  Right Stent(s): +---------------+---+--++++ P

## 2021-10-16 NOTE — Assessment & Plan Note (Signed)
On B12 

## 2021-10-16 NOTE — Assessment & Plan Note (Signed)
Use a back brace prn ?Blue-Emu cream was recommended to use 2-3 times a day ? ?

## 2021-10-16 NOTE — Patient Instructions (Signed)
Blue-Emu cream -- use 2-3 times a day ? ?

## 2021-10-18 ENCOUNTER — Ambulatory Visit: Payer: PPO

## 2021-10-23 ENCOUNTER — Other Ambulatory Visit: Payer: Self-pay

## 2021-10-23 ENCOUNTER — Ambulatory Visit: Payer: PPO

## 2021-10-23 DIAGNOSIS — Z7189 Other specified counseling: Secondary | ICD-10-CM | POA: Insufficient documentation

## 2021-10-28 ENCOUNTER — Ambulatory Visit: Payer: PPO

## 2021-10-28 ENCOUNTER — Telehealth: Payer: Self-pay

## 2021-10-28 NOTE — Progress Notes (Signed)
Spoke with daughter who requested to reschedule [place] , until she could be with him for the call.   L.[place],LPN

## 2021-10-28 NOTE — Telephone Encounter (Signed)
Spoke with daughter who requested to reschedule [place] , until she could be with father for the call.  L.[place],LPN

## 2021-10-29 ENCOUNTER — Telehealth: Payer: Self-pay | Admitting: Internal Medicine

## 2021-10-29 NOTE — Telephone Encounter (Signed)
LVM for pt to rtn my call to schedule AWV with [facility]. Call back # [phone] 

## 2021-10-29 NOTE — Telephone Encounter (Signed)
Lvm for pt's daughter to rtn my call to schedule AWV with [facility].

## 2021-11-18 ENCOUNTER — Ambulatory Visit (INDEPENDENT_AMBULATORY_CARE_PROVIDER_SITE_OTHER): Payer: PPO

## 2021-11-18 DIAGNOSIS — Z0000 Encounter for general adult medical examination without abnormal findings: Secondary | ICD-10-CM

## 2021-11-18 NOTE — Progress Notes (Cosign Needed Addendum)
Subjective:   Richard Burgess is a 88 y.o. male who presents for an Subsequent  Medicare Annual Wellness Visit.   I connected with Richard Burgess today by telephone and verified that I am speaking with the correct person using two identifiers. Location patient: home Location provider: work Persons participating in the virtual visit: patient, provider.   I discussed the limitations, risks, security and privacy concerns of performing an evaluation and management service by telephone and the availability of in person appointments. I also discussed with the patient that there may be a patient responsible charge related to this service. The patient expressed understanding and verbally consented to this telephonic visit.    Interactive audio and video telecommunications were attempted between this provider and patient, however failed, due to patient having technical difficulties OR patient did not have access to video capability.  We continued and completed visit with audio only.    Review of Systems           Objective:    Today's Vitals   There is no height or weight on file to calculate BMI.     11/18/2021   10:06 AM 10/16/2020   12:58 PM 04/25/2020    6:47 AM 04/20/2020   10:24 AM 07/22/2018    9:50 AM 04/10/2017    6:00 PM 04/10/2017   12:46 PM  Advanced Directives  Does Patient Have a Medical Advance Directive? Yes Yes No No No Yes No  Type of Paramedic of [place];Living will Living will;Healthcare Power of Attorney    Living will   Does patient want to make changes to medical advance directive?  No - Patient declined    No - Patient declined   Copy of [place] in Chart? No - copy requested No - copy requested       Would patient like information on creating a medical advance directive?   No - Patient declined No - Patient declined No - Patient declined      Current Medications (verified) Outpatient Encounter Medications as of  11/18/2021  Medication Sig   albuterol (VENTOLIN HFA) 108 (90 Base) MCG/ACT inhaler INHALE 2 PUFFS BY MOUTH FOUR TIMES A DAY AS NEEDED FOR SHORTNESS OF BREATH   aspirin EC 81 MG tablet Take 81 mg by mouth daily.   Carboxymethylcellulose Sodium 1 % GEL APPLY 1 DROP TO LEFT EYE FOUR TIMES A DAY AS NEEDED   Cholecalciferol (VITAMIN D) 50 MCG (2000 UT) tablet Take 2,000 Units by mouth daily with supper.   Cholecalciferol 25 MCG (1000 UT) tablet Take by mouth.   Cinnamon 500 MG capsule Take 2,000 mg by mouth daily.   clotrimazole (LOTRIMIN) 1 % cream APPLY SMALL AMOUNT TO AFFECTED AREA TWICE A DAY   cyanocobalamin 500 MCG tablet Take 500 mcg by mouth daily. Vitamin B12   docusate calcium (SURFAK) 240 MG capsule Take 240 mg by mouth at bedtime.   ibuprofen (ADVIL) 600 MG tablet Take by mouth.   isosorbide mononitrate (IMDUR) 60 MG 24 hr tablet Take 1 tablet (60 mg total) by mouth daily.   Lifitegrast 5 % SOLN INSTILL 1 DROP IN EACH EYE TWICE A DAY   losartan (COZAAR) 25 MG tablet TAKE 1 TABLET BY MOUTH DAILY   melatonin 3 MG TABS tablet Take 3 mg by mouth at bedtime.   Multiple Vitamins-Minerals (PRESERVISION AREDS 2) CAPS Take 1 capsule by mouth in the morning and at bedtime.   nitroGLYCERIN (NITROSTAT) 0.4 MG  SL tablet Place 1 tablet (0.4 mg total) under the tongue every 5 (five) minutes as needed for chest pain.   pantoprazole (PROTONIX) 40 MG tablet TAKE ONE TABLET BY MOUTH TWICE A DAY (TAKE ON AN EMPTY STOMACH 30 MINUTES PRIOR TO A MEAL)   Polyethyl Glycol-Propyl Glycol (SYSTANE OP) Place 1 drop into both eyes 4 (four) times daily as needed (dry eyes).   Propylene Glycol 0.6 % SOLN INSTILL 1 DROP IN EACH EYE FOUR TIMES A DAY   REPATHA SURECLICK 283 MG/ML SOAJ INJECT 140 MG INTO THE SKIN EVERY 14 (FOURTEEN) DAYS. PT. IS OVERDUE FOR FOLLOWUP.   traMADol (ULTRAM) 50 MG tablet 1 tablet (50 mg total) every 6 (six) hours as needed.   tretinoin (RETIN-A) 0.1 % cream APPLY THIN LAYER TO AFFECTED AREA  NIGHTLY AS DIRECTED. APPLY A SMALL AMOUNT TO THE BUMPS ON THE ARMS AND CHEST NIGHTLY   triamcinolone ointment (KENALOG) 0.1 % Apply 1 application topically 2 (two) times daily.   valACYclovir (VALTREX) 500 MG tablet Take 500 mg by mouth daily.   vitamin B-12 (CYANOCOBALAMIN) 500 MCG tablet Take 1 tablet by mouth daily.   busPIRone (BUSPAR) 10 MG tablet TAKE ONE-HALF TABLET BY MOUTH DAILY AS NEEDED FOR ANXIETY (Patient not taking: Reported on 11/18/2021)   carbamide peroxide (DEBROX) 6.5 % OTIC solution INSTILL 5-10 DROPS IN RIGHT EAR TWICE A DAY USE BEFORE BATHING TO LOOSEN WAX FOR NEXT 3 DAYS THEN AS NEEDED (Patient not taking: Reported on 11/18/2021)   clopidogrel (PLAVIX) 75 MG tablet Take 1 tablet by mouth daily. (Patient not taking: Reported on 11/18/2021)   doxycycline (MONODOX) 100 MG capsule TAKE ONE CAPSULE BY MOUTH TWICE A DAY FOR INFECTION (Patient not taking: Reported on 11/18/2021)   erythromycin ophthalmic ointment Use over your eyelid incisions four times per day for 10 days after surgery. (Patient not taking: Reported on 11/18/2021)   IBU 600 MG tablet Take by mouth. (Patient not taking: Reported on 11/18/2021)   losartan (COZAAR) 25 MG tablet TAKE ONE TABLET BY MOUTH EVERY MORNING FOR HEART   melatonin 1 MG TABS tablet Take by mouth. (Patient not taking: Reported on 11/18/2021)   metFORMIN (GLUCOPHAGE-XR) 750 MG 24 hr tablet TAKE ONE TABLET BY MOUTH DAILY FOR DIABETES (ANNUAL KIDNEY FUNCTION TESTING IS NEEDED) (Patient not taking: Reported on 11/18/2021)   methylPREDNISolone (MEDROL DOSEPAK) 4 MG TBPK tablet TAKE TABLETS BY MOUTH AS DIRECTED ON PACKAGE INSTRUCTIONS FOR INFLAMMATION (Patient not taking: Reported on 11/18/2021)   tamsulosin (FLOMAX) 0.4 MG CAPS capsule Take 2 capsules by mouth at bedtime. (Patient not taking: Reported on 11/18/2021)   valACYclovir (VALTREX) 500 MG tablet Take 1 tablet by mouth daily. (Patient not taking: Reported on 11/18/2021)   No facility-administered  encounter medications on file as of 11/18/2021.    Allergies (verified) Bee venom, Amlodipine, Livalo [pitavastatin], Oxycodone, Pravastatin sodium, Zetia [ezetimibe], Rosuvastatin, and Statins   History: Past Medical History:  Diagnosis Date   Allergy    BPH (benign prostatic hypertrophy)    Carotid artery occlusion    Diverticulosis of colon    ED (erectile dysfunction)    GERD (gastroesophageal reflux disease)    Glucose intolerance (impaired glucose tolerance)    Hearing loss    no hearing aids   History of pancreatitis    HSV infection    left eye   Hx-TIA (transient ischemic attack)    Hyperlipidemia    Hypertension    Hypertention, malignant, with acute intensive management    Low  back pain    Osteoarthritis    PVD (peripheral vascular disease) ([place])    Bilateral carotid   Tubular adenoma of colon 08/1991   One with carcinoma IN [facility] 1993, multiple adenomatous   Wears partial dentures    upper   Past Surgical History:  Procedure Laterality Date   APPENDECTOMY     BASAL CELL CARCINOMA EXCISION     check and ear lobe   BLEPHAROPLASTY Left    x 3   CHOLECYSTECTOMY     COLONOSCOPY     ESOPHAGOGASTRODUODENOSCOPY     LEFT HEART CATH AND CORONARY ANGIOGRAPHY N/A 01/26/2017   Procedure: LEFT HEART CATH AND CORONARY ANGIOGRAPHY;  Surgeon: Lorretta Harp, MD;  Location: [facility];  Service: Cardiovascular;  Laterality: N/A;   LUMBAR SPINE SURGERY     X4   TRANSCAROTID ARTERY REVASCULARIZATION  Right 04/25/2020   Procedure: RIGHT TRANSCAROTID ARTERY REVASCULARIZATION;  Surgeon: Serafina Mitchell, MD;  Location: [facility];  Service: Vascular;  Laterality: Right;   ULTRASOUND GUIDANCE FOR VASCULAR ACCESS Right 04/25/2020   Procedure: ULTRASOUND GUIDANCE FOR VASCULAR ACCESS;  Surgeon: Serafina Mitchell, MD;  Location: [facility];  Service: Vascular;  Laterality: Right;   Family History  Problem Relation Age of Onset   Dementia Father    Diabetes Mother    Peripheral  vascular disease Brother    Colon cancer Sister        dx in her 45's   Social History   Socioeconomic History   Marital status: Widowed    Spouse name: Not on file   Number of children: 3   Years of education: Not on file   Highest education level: Not on file  Occupational History   Occupation: Retired-Construction  Tobacco Use   Smoking status: Former    Packs/day: 0.50    Years: 20.00    Total pack years: 10.00    Types: Cigarettes    Start date: 35    Quit date: 1970    Years since quitting: 53.4   Smokeless tobacco: Never   Tobacco comments:    Daily caffeine use, regular exercise  Vaping Use   Vaping Use: Never used  Substance and Sexual Activity   Alcohol use: Yes    Alcohol/week: 1.0 - 2.0 standard drink of alcohol    Types: 1 - 2 Cans of beer per week   Drug use: No   Sexual activity: Yes  Other Topics Concern   Not on file  Social History Narrative   Not on file   Social Determinants of Health   Financial Resource Strain: Low Risk  (11/18/2021)   Overall Financial Resource Strain (CARDIA)    Difficulty of Paying Living Expenses: Not hard at all  Food Insecurity: No Food Insecurity (11/18/2021)   Hunger Vital Sign    Worried About Running Out of Food in the Last Year: Never true    Ran Out of Food in the Last Year: Never true  Transportation Needs: No Transportation Needs (11/18/2021)   PRAPARE - Hydrologist (Medical): No    Lack of Transportation (Non-Medical): No  Physical Activity: Insufficiently Active (11/18/2021)   Exercise Vital Sign    Days of Exercise per Week: 5 days    Minutes of Exercise per Session: 20 min  Stress: No Stress Concern Present (11/18/2021)   [place]    Feeling of Stress : Not at all  Social Connections: Moderately Isolated (11/18/2021)   Social Connection and Isolation Panel [NHANES]    Frequency of Communication with Friends  and Family: Three times a week    Frequency of Social Gatherings with Friends and Family: Three times a week    Attends Religious Services: Never    Active Member of Clubs or Organizations: Yes    Attends Archivist Meetings: More than 4 times per year    Marital Status: Widowed    Tobacco Counseling Counseling given: Not Answered Tobacco comments: Daily caffeine use, regular exercise   Clinical Intake:  Pre-visit preparation completed: Yes  Pain : No/denies pain     Nutritional Risks: None Diabetes: Yes CBG done?: No Did pt. bring in CBG monitor from home?: No  How often do you need to have someone help you when you read instructions, pamphlets, or other written materials from your doctor or pharmacy?: 1 - Never What is the last grade level you completed in school?: High School  Diabetic?yes Nutrition Risk Assessment:  Has the patient had any N/V/D within the last 2 months?  No  Does the patient have any non-healing wounds?  No  Has the patient had any unintentional weight loss or weight gain?  No   Diabetes:  Is the patient diabetic?  Yes  If diabetic, was a CBG obtained today?  No  Did the patient bring in their glucometer from home?  No  How often do you monitor your CBG's? Never machine is broken .   Financial Strains and Diabetes Management:  Are you having any financial strains with the device, your supplies or your medication? No .  Does the patient want to be seen by Chronic Care Management for management of their diabetes?  No  Would the patient like to be referred to a Nutritionist or for Diabetic Management?  No   Diabetic Exams:  Diabetic Eye Exam: Completed 07/2021 Diabetic Foot Exam: Overdue, Pt has been advised about the importance in completing this exam. Pt is scheduled for diabetic foot exam on next office visit .   Interpreter Needed?: No  Information entered by :: [email or web address]   Activities of Daily Living     No data to  display          Patient Care Team: Plotnikov, Evie Lacks, MD as PCP - General [name], MD as PCP - Cardiology (Cardiology) [name], MD as Attending Physician (Cardiology) Szabat, Darnelle Maffucci, [facility] as Pharmacist (Pharmacist)  Indicate any recent Medical Services you may have received from other than [facility] providers in the past year (date may be approximate).     Assessment:   This is a routine wellness examination for Richard Burgess.  Hearing/Vision screen Vision Screening - Comments:: Annual eye exams wear glasses   Dietary issues and exercise activities discussed:     Goals Addressed   None    Depression Screen    11/18/2021   10:07 AM 11/18/2021   10:05 AM 07/16/2021   10:06 AM 10/16/2020   12:57 PM 01/27/2018    8:23 AM 12/11/2016    8:03 AM 11/21/2015    7:56 AM  PHQ 2/9 Scores  PHQ - 2 Score 0 0 0 0 0 0 0  PHQ- 9 Score      0     Fall Risk    11/18/2021   10:07 AM 07/16/2021   10:06 AM 10/16/2020   12:58 PM 05/04/2019    9:47 AM 01/27/2018    8:23 AM  Fall Risk   Falls in the past year? 0 0 0 0 No  Comment    Emmi Telephone Survey: data to providers prior to load   Number falls in past yr: 0 0 0    Injury with Fall? 0 0 0    Risk for fall due to :   No Fall Risks    Follow up Falls evaluation completed;Education provided  Falls evaluation completed      [place]:  Any stairs in or around the home? No  If so, are there any without handrails? No  Home free of loose throw rugs in walkways, pet beds, electrical cords, etc? Yes  Adequate lighting in your home to reduce risk of falls? Yes   ASSISTIVE DEVICES UTILIZED TO PREVENT FALLS:  Life alert? No  Use of a cane, walker or w/c? Yes  Grab bars in the bathroom? Yes  Shower chair or bench in shower? Yes  Elevated toilet seat or a handicapped toilet? Yes     Cognitive Function:Normal cognitive status assessed by telephone conversation  by this Nurse Health Advisor.  No abnormalities found.      10/30/2014    9:12 AM  MMSE - Mini Mental State Exam  Not completed: Unable to complete        Immunizations Immunization History  Administered Date(s) Administered   Fluad Quad(high Dose 65+) 03/27/2020, 04/22/2021   Influenza Split 02/08/2015, 01/08/2016   Influenza, High Dose Seasonal PF 02/18/2016, 03/09/2016, 04/16/2017, 03/03/2018, 03/30/2019, 03/09/2021   Influenza-Unspecified 07/11/2015, 03/10/2018   Moderna Covid-19 Vaccine Bivalent Booster 63yr & up 04/22/2021   Moderna Sars-Covid-2 Vaccination 07/19/2019, 08/16/2019   Pneumococcal Conjugate-13 08/01/2013   Pneumococcal Polysaccharide-23 07/20/2015   Td 02/23/2008   Td,absorbed, Preservative Free, Adult Use, Lf Unspecified 12/20/2020   Tdap 11/08/2010   Tetanus Immune Globulin 02/23/2008   Zoster Recombinat (Shingrix) 10/05/2020, 12/20/2020    TDAP status: Due, Education has been provided regarding the importance of this vaccine. Advised may receive this vaccine at local pharmacy or Health Dept. Aware to provide a copy of the vaccination record if obtained from local pharmacy or Health Dept. Verbalized acceptance and understanding.  Flu Vaccine status: Up to date  Pneumococcal vaccine status: Up to date  Covid-19 vaccine status: Completed vaccines  Qualifies for Shingles Vaccine? Yes   Zostavax completed Yes   Shingrix Completed?: Yes  Screening Tests Health Maintenance  Topic Date Due   FOOT EXAM  Never done   TETANUS/TDAP  11/07/2020   COVID-19 Vaccine (4 - Booster for Moderna series) 06/17/2021   INFLUENZA VACCINE  01/07/2022   HEMOGLOBIN A1C  04/18/2022   OPHTHALMOLOGY EXAM  07/11/2022   Pneumonia Vaccine 88 Years old  Completed   Zoster Vaccines- Shingrix  Completed   HPV VACCINES  Aged Out    Health Maintenance  Health Maintenance Due  Topic Date Due   FOOT EXAM  Never done   TETANUS/TDAP  11/07/2020   COVID-19 Vaccine (4 - Booster for Moderna series)  06/17/2021    Colorectal cancer screening: No longer required.   Lung Cancer Screening: (Low Dose CT Chest recommended if Age 86-80years, 30 pack-year currently smoking OR have quit w/in 15years.) does not qualify.   Lung Cancer Screening Referral: n/a  Additional Screening:  Hepatitis C Screening: does not qualify;   Vision Screening: Recommended annual ophthalmology exams for early detection of glaucoma and other disorders of the eye. Is the patient up to date with their  annual eye exam?  Yes  Who is the provider or what is the name of the office in which the patient attends [facility] exams? [facility] If pt is not established with a provider, would they like to be referred to a provider to establish care? No .   Dental Screening: Recommended annual dental exams for proper oral hygiene  Community Resource Referral / Chronic Care Management: CRR required this visit?  No   CCM required this visit?  No      Plan:     I have personally reviewed and noted the following in the patient's chart:   Medical and social history Use of alcohol, tobacco or illicit drugs  Current medications and supplements including opioid prescriptions. Patient is not currently taking opioid prescriptions. Functional ability and status Nutritional status Physical activity Advanced directives List of other physicians Hospitalizations, surgeries, and ER visits in previous 12 months Vitals Screenings to include cognitive, depression, and falls Referrals and appointments  In addition, I have reviewed and discussed with patient certain preventive protocols, quality metrics, and best practice recommendations. A written personalized care plan for preventive services as well as general preventive health recommendations were provided to patient.     Randel Pigg, LPN   [id number]/40/[id number]   Nurse Notes: none   Medical screening examination/treatment/procedure(s) were performed by non-physician practitioner and as  supervising physician I was immediately available for consultation/collaboration.  I agree with above. Lew Dawes, MD

## 2021-11-18 NOTE — Patient Instructions (Signed)
Mr. Richard Burgess , Thank you for taking time to come for your Medicare Wellness Visit. I appreciate your ongoing commitment to your health goals. Please review the following plan we discussed and let me know if I can assist you in the future.   Screening recommendations/referrals: Colonoscopy: no longer required  Recommended yearly ophthalmology/optometry visit for glaucoma screening and checkup Recommended yearly dental visit for hygiene and checkup  Vaccinations: Influenza vaccine: completed  Pneumococcal vaccine: completed  Tdap vaccine: due  Shingles vaccine: completed     Advanced directives: yes   Conditions/risks identified: none   Next appointment: none   Preventive Care 56 Years and Older, Male Preventive care refers to lifestyle choices and visits with your health care provider that can promote health and wellness. What does preventive care include? A yearly physical exam. This is also called an annual well check. Dental exams once or twice a year. Routine eye exams. Ask your health care provider how often you should have your eyes checked. Personal lifestyle choices, including: Daily care of your teeth and gums. Regular physical activity. Eating a healthy diet. Avoiding tobacco and drug use. Limiting alcohol use. Practicing safe sex. Taking low doses of aspirin every day. Taking vitamin and mineral supplements as recommended by your health care provider. What happens during an annual well check? The services and screenings done by your health care provider during your annual well check will depend on your age, overall health, lifestyle risk factors, and family history of disease. Counseling  Your health care provider may ask you questions about your: Alcohol use. Tobacco use. Drug use. Emotional well-being. Home and relationship well-being. Sexual activity. Eating habits. History of falls. Memory and ability to understand (cognition). Work and work  Statistician. Screening  You may have the following tests or measurements: Height, weight, and BMI. Blood pressure. Lipid and cholesterol levels. These may be checked every 5 years, or more frequently if you are over 48 years old. Skin check. Lung cancer screening. You may have this screening every year starting at age 22 if you have a 30-pack-year history of smoking and currently smoke or have quit within the past 15 years. Fecal occult blood test (FOBT) of the stool. You may have this test every year starting at age 43. Flexible sigmoidoscopy or colonoscopy. You may have a sigmoidoscopy every 5 years or a colonoscopy every 10 years starting at age 22. Prostate cancer screening. Recommendations will vary depending on your family history and other risks. Hepatitis C blood test. Hepatitis B blood test. Sexually transmitted disease (STD) testing. Diabetes screening. This is done by checking your blood sugar (glucose) after you have not eaten for a while (fasting). You may have this done every 1-3 years. Abdominal aortic aneurysm (AAA) screening. You may need this if you are a current or former smoker. Osteoporosis. You may be screened starting at age 22 if you are at high risk. Talk with your health care provider about your test results, treatment options, and if necessary, the need for more tests. Vaccines  Your health care provider may recommend certain vaccines, such as: Influenza vaccine. This is recommended every year. Tetanus, diphtheria, and acellular pertussis (Tdap, Td) vaccine. You may need a Td booster every 10 years. Zoster vaccine. You may need this after age 21. Pneumococcal 13-valent conjugate (PCV13) vaccine. One dose is recommended after age 70. Pneumococcal polysaccharide (PPSV23) vaccine. One dose is recommended after age 81. Talk to your health care provider about which screenings and vaccines you need and  how often you need them. This information is not intended to replace  advice given to you by your health care provider. Make sure you discuss any questions you have with your health care provider. Document Released: 06/22/2015 Document Revised: 02/13/2016 Document Reviewed: 03/27/2015 Elsevier Interactive Patient Education  2017 [employer] Prevention in the Home Falls can cause injuries. They can happen to people of all ages. There are many things you can do to make your home safe and to help prevent falls. What can I do on the outside of my home? Regularly fix the edges of walkways and driveways and fix any cracks. Remove anything that might make you trip as you walk through a door, such as a raised step or threshold. Trim any bushes or trees on the path to your home. Use bright outdoor lighting. Clear any walking paths of anything that might make someone trip, such as rocks or tools. Regularly check to see if handrails are loose or broken. Make sure that both sides of any steps have handrails. Any raised decks and porches should have guardrails on the edges. Have any leaves, snow, or ice cleared regularly. Use sand or salt on walking paths during winter. Clean up any spills in your garage right away. This includes oil or grease spills. What can I do in the bathroom? Use night lights. Install grab bars by the toilet and in the tub and shower. Do not use towel bars as grab bars. Use non-skid mats or decals in the tub or shower. If you need to sit down in the shower, use a plastic, non-slip stool. Keep the floor dry. Clean up any water that spills on the floor as soon as it happens. Remove soap buildup in the tub or shower regularly. Attach bath mats securely with double-sided non-slip rug tape. Do not have throw rugs and other things on the floor that can make you trip. What can I do in the bedroom? Use night lights. Make sure that you have a light by your bed that is easy to reach. Do not use any sheets or blankets that are too big for your bed.  They should not hang down onto the floor. Have a firm chair that has side arms. You can use this for support while you get dressed. Do not have throw rugs and other things on the floor that can make you trip. What can I do in the kitchen? Clean up any spills right away. Avoid walking on wet floors. Keep items that you use a lot in easy-to-reach places. If you need to reach something above you, use a strong step stool that has a grab bar. Keep electrical cords out of the way. Do not use floor polish or wax that makes floors slippery. If you must use wax, use non-skid floor wax. Do not have throw rugs and other things on the floor that can make you trip. What can I do with my stairs? Do not leave any items on the stairs. Make sure that there are handrails on both sides of the stairs and use them. Fix handrails that are broken or loose. Make sure that handrails are as long as the stairways. Check any carpeting to make sure that it is firmly attached to the stairs. Fix any carpet that is loose or worn. Avoid having throw rugs at the top or bottom of the stairs. If you do have throw rugs, attach them to the floor with carpet tape. Make sure that you have  a light switch at the top of the stairs and the bottom of the stairs. If you do not have them, ask someone to add them for you. What else can I do to help prevent falls? Wear shoes that: Do not have high heels. Have rubber bottoms. Are comfortable and fit you well. Are closed at the toe. Do not wear sandals. If you use a stepladder: Make sure that it is fully opened. Do not climb a closed stepladder. Make sure that both sides of the stepladder are locked into place. Ask someone to hold it for you, if possible. Clearly mark and make sure that you can see: Any grab bars or handrails. First and last steps. Where the edge of each step is. Use tools that help you move around (mobility aids) if they are needed. These  include: Canes. Walkers. Scooters. Crutches. Turn on the lights when you go into a dark area. Replace any light bulbs as soon as they burn out. Set up your furniture so you have a clear path. Avoid moving your furniture around. If any of your floors are uneven, fix them. If there are any pets around you, be aware of where they are. Review your medicines with your doctor. Some medicines can make you feel dizzy. This can increase your chance of falling. Ask your doctor what other things that you can do to help prevent falls. This information is not intended to replace advice given to you by your health care provider. Make sure you discuss any questions you have with your health care provider. Document Released: 03/22/2009 Document Revised: 11/01/2015 Document Reviewed: 06/30/2014 Elsevier Interactive Patient Education  2017 [employer].

## 2022-01-01 ENCOUNTER — Other Ambulatory Visit: Payer: Self-pay | Admitting: *Deleted

## 2022-01-01 DIAGNOSIS — I6523 Occlusion and stenosis of bilateral carotid arteries: Secondary | ICD-10-CM

## 2022-01-18 NOTE — Progress Notes (Unsigned)
HISTORY AND PHYSICAL     CC:  follow up. Requesting Provider:  Plotnikov, Evie Lacks, MD  HPI: This is a 88 y.o. male here for follow up for carotid artery stenosis.  Pt is s/p right TCAR for asymptomatic carotid artery stenosis on 04/25/2020 by Dr. Trula Slade.  His post op course was prolonged due to post op bradycardia.  Pt was last seen 11/26/2020 and at that time he was doing well without stroke symptoms.  His duplex revealed 1-39% left ICA stenosis and his right carotid stent was patent.    Pt returns today for follow up.    Pt *** any amaurosis fugax, speech difficulties, weakness, numbness, paralysis or clumsiness or facial droop.    ***  The pt is not on a statin for cholesterol management.  The pt is on a daily aspirin.   Other AC:  Plavix The pt is on ARB for hypertension.   The pt does not have diabetes Tobacco hx:  former  Pt does *** have family hx of AAA.  Past Medical History:  Diagnosis Date   Allergy    BPH (benign prostatic hypertrophy)    Carotid artery occlusion    Diverticulosis of colon    ED (erectile dysfunction)    GERD (gastroesophageal reflux disease)    Glucose intolerance (impaired glucose tolerance)    Hearing loss    no hearing aids   History of pancreatitis    HSV infection    left eye   Hx-TIA (transient ischemic attack)    Hyperlipidemia    Hypertension    Hypertention, malignant, with acute intensive management    Low back pain    Osteoarthritis    PVD (peripheral vascular disease) ([place])    Bilateral carotid   Tubular adenoma of colon 08/1991   One with carcinoma IN [facility] 1993, multiple adenomatous   Wears partial dentures    upper    Past Surgical History:  Procedure Laterality Date   APPENDECTOMY     BASAL CELL CARCINOMA EXCISION     check and ear lobe   BLEPHAROPLASTY Left    x 3   CHOLECYSTECTOMY     COLONOSCOPY     ESOPHAGOGASTRODUODENOSCOPY     LEFT HEART CATH AND CORONARY ANGIOGRAPHY N/A 01/26/2017   Procedure: LEFT  HEART CATH AND CORONARY ANGIOGRAPHY;  Surgeon: Lorretta Harp, MD;  Location: [facility];  Service: Cardiovascular;  Laterality: N/A;   LUMBAR SPINE SURGERY     X4   TRANSCAROTID ARTERY REVASCULARIZATION  Right 04/25/2020   Procedure: RIGHT TRANSCAROTID ARTERY REVASCULARIZATION;  Surgeon: Serafina Mitchell, MD;  Location: [facility];  Service: Vascular;  Laterality: Right;   ULTRASOUND GUIDANCE FOR VASCULAR ACCESS Right 04/25/2020   Procedure: ULTRASOUND GUIDANCE FOR VASCULAR ACCESS;  Surgeon: Serafina Mitchell, MD;  Location: [facility];  Service: Vascular;  Laterality: Right;    Allergies  Allergen Reactions   Bee Venom Swelling   Amlodipine Other (See Comments)    Weakness in legs   Livalo [Pitavastatin]     Pain, SOB   Oxycodone Other (See Comments)    crazy   Pravastatin Sodium Other (See Comments)    REACTION: bad dreams   Zetia [Ezetimibe]     LEG PAIN    Rosuvastatin Palpitations   Statins Palpitations and Other (See Comments)    REACTION: aches, bad dreams    Current Outpatient Medications  Medication Sig Dispense Refill   albuterol (VENTOLIN HFA) 108 (90 Base) MCG/ACT inhaler INHALE  2 PUFFS BY MOUTH FOUR TIMES A DAY AS NEEDED FOR SHORTNESS OF BREATH     aspirin EC 81 MG tablet Take 81 mg by mouth daily.     busPIRone (BUSPAR) 10 MG tablet TAKE ONE-HALF TABLET BY MOUTH DAILY AS NEEDED FOR ANXIETY (Patient not taking: Reported on 11/18/2021)     carbamide peroxide (DEBROX) 6.5 % OTIC solution INSTILL 5-10 DROPS IN RIGHT EAR TWICE A DAY USE BEFORE BATHING TO LOOSEN WAX FOR NEXT 3 DAYS THEN AS NEEDED (Patient not taking: Reported on 11/18/2021)     Carboxymethylcellulose Sodium 1 % GEL APPLY 1 DROP TO LEFT EYE FOUR TIMES A DAY AS NEEDED     Cholecalciferol (VITAMIN D) 50 MCG (2000 UT) tablet Take 2,000 Units by mouth daily with supper.     Cholecalciferol 25 MCG (1000 UT) tablet Take by mouth.     Cinnamon 500 MG capsule Take 2,000 mg by mouth daily.     clopidogrel (PLAVIX)  75 MG tablet Take 1 tablet by mouth daily. (Patient not taking: Reported on 11/18/2021)     clotrimazole (LOTRIMIN) 1 % cream APPLY SMALL AMOUNT TO AFFECTED AREA TWICE A DAY     cyanocobalamin 500 MCG tablet Take 500 mcg by mouth daily. Vitamin B12     docusate calcium (SURFAK) 240 MG capsule Take 240 mg by mouth at bedtime.     doxycycline (MONODOX) 100 MG capsule TAKE ONE CAPSULE BY MOUTH TWICE A DAY FOR INFECTION (Patient not taking: Reported on 11/18/2021)     erythromycin ophthalmic ointment Use over your eyelid incisions four times per day for 10 days after surgery. (Patient not taking: Reported on 11/18/2021)     IBU 600 MG tablet Take by mouth. (Patient not taking: Reported on 11/18/2021)     ibuprofen (ADVIL) 600 MG tablet Take by mouth.     isosorbide mononitrate (IMDUR) 60 MG 24 hr tablet Take 1 tablet (60 mg total) by mouth daily. 90 tablet 3   Lifitegrast 5 % SOLN INSTILL 1 DROP IN EACH EYE TWICE A DAY     losartan (COZAAR) 25 MG tablet TAKE 1 TABLET BY MOUTH DAILY 90 tablet 3   losartan (COZAAR) 25 MG tablet TAKE ONE TABLET BY MOUTH EVERY MORNING FOR HEART     melatonin 1 MG TABS tablet Take by mouth. (Patient not taking: Reported on 11/18/2021)     melatonin 3 MG TABS tablet Take 3 mg by mouth at bedtime.     metFORMIN (GLUCOPHAGE-XR) 750 MG 24 hr tablet TAKE ONE TABLET BY MOUTH DAILY FOR DIABETES (ANNUAL KIDNEY FUNCTION TESTING IS NEEDED) (Patient not taking: Reported on 11/18/2021)     methylPREDNISolone (MEDROL DOSEPAK) 4 MG TBPK tablet TAKE TABLETS BY MOUTH AS DIRECTED ON PACKAGE INSTRUCTIONS FOR INFLAMMATION (Patient not taking: Reported on 11/18/2021)     Multiple Vitamins-Minerals (PRESERVISION AREDS 2) CAPS Take 1 capsule by mouth in the morning and at bedtime.     nitroGLYCERIN (NITROSTAT) 0.4 MG SL tablet Place 1 tablet (0.4 mg total) under the tongue every 5 (five) minutes as needed for chest pain. 20 tablet 1   pantoprazole (PROTONIX) 40 MG tablet TAKE ONE TABLET BY MOUTH TWICE  A DAY (TAKE ON AN EMPTY STOMACH 30 MINUTES PRIOR TO A MEAL)     Polyethyl Glycol-Propyl Glycol (SYSTANE OP) Place 1 drop into both eyes 4 (four) times daily as needed (dry eyes).     Propylene Glycol 0.6 % SOLN INSTILL 1 DROP IN [facility] EYE FOUR TIMES  A DAY     REPATHA SURECLICK 096 MG/ML SOAJ INJECT 140 MG INTO THE SKIN EVERY 14 (FOURTEEN) DAYS. PT. IS OVERDUE FOR FOLLOWUP. 6 mL 3   tamsulosin (FLOMAX) 0.4 MG CAPS capsule Take 2 capsules by mouth at bedtime. (Patient not taking: Reported on 11/18/2021)     traMADol (ULTRAM) 50 MG tablet 1 tablet (50 mg total) every 6 (six) hours as needed.     tretinoin (RETIN-A) 0.1 % cream APPLY THIN LAYER TO AFFECTED AREA NIGHTLY AS DIRECTED. APPLY A SMALL AMOUNT TO THE BUMPS ON THE ARMS AND CHEST NIGHTLY     triamcinolone ointment (KENALOG) 0.1 % Apply 1 application topically 2 (two) times daily. 450 g 3   valACYclovir (VALTREX) 500 MG tablet Take 500 mg by mouth daily.     valACYclovir (VALTREX) 500 MG tablet Take 1 tablet by mouth daily. (Patient not taking: Reported on 11/18/2021)     vitamin B-12 (CYANOCOBALAMIN) 500 MCG tablet Take 1 tablet by mouth daily.     No current facility-administered medications for this visit.    Family History  Problem Relation Age of Onset   Dementia Father    Diabetes Mother    Peripheral vascular disease Brother    Colon cancer Sister        dx in her 50's    Social History   Socioeconomic History   Marital status: Widowed    Spouse name: Not on file   Number of children: 3   Years of education: Not on file   Highest education level: Not on file  Occupational History   Occupation: Retired-Construction  Tobacco Use   Smoking status: Former    Packs/day: 0.50    Years: 20.00    Total pack years: 10.00    Types: Cigarettes    Start date: 80    Quit date: 1970    Years since quitting: 53.6   Smokeless tobacco: Never   Tobacco comments:    Daily caffeine use, regular exercise  Vaping Use   Vaping Use:  Never used  Substance and Sexual Activity   Alcohol use: Yes    Alcohol/week: 1.0 - 2.0 standard drink of alcohol    Types: 1 - 2 Cans of beer per week   Drug use: No   Sexual activity: Yes  Other Topics Concern   Not on file  Social History Narrative   Not on file   Social Determinants of Health   Financial Resource Strain: Low Risk  (11/18/2021)   Overall Financial Resource Strain (CARDIA)    Difficulty of Paying Living Expenses: Not hard at all  Food Insecurity: No Food Insecurity (11/18/2021)   Hunger Vital Sign    Worried About Running Out of Food in the Last Year: Never true    Ran Out of Food in the Last Year: Never true  Transportation Needs: No Transportation Needs (11/18/2021)   PRAPARE - Hydrologist (Medical): No    Lack of Transportation (Non-Medical): No  Physical Activity: Insufficiently Active (11/18/2021)   Exercise Vital Sign    Days of Exercise per Week: 5 days    Minutes of Exercise per Session: 20 min  Stress: No Stress Concern Present (11/18/2021)   [place]    Feeling of Stress : Not at all  Social Connections: Moderately Isolated (11/18/2021)   Social Connection and Isolation Panel [NHANES]    Frequency of Communication with Friends and Family: Three  times a week    Frequency of Social Gatherings with Friends and Family: Three times a week    Attends Religious Services: Never    Active Member of Clubs or Organizations: Yes    Attends Archivist Meetings: More than 4 times per year    Marital Status: Widowed  Intimate Partner Violence: Not At Risk (11/18/2021)   Humiliation, Afraid, Rape, and Kick questionnaire    Fear of Current or Ex-Partner: No    Emotionally Abused: No    Physically Abused: No    Sexually Abused: No     REVIEW OF SYSTEMS:  *** '[X]'$  denotes positive finding, '[ ]'$  denotes negative finding Cardiac  Comments:  Chest pain or chest  pressure:    Shortness of breath upon exertion:    Short of breath when lying flat:    Irregular heart rhythm:        Vascular    Pain in calf, thigh, or hip brought on by ambulation:    Pain in feet at night that wakes you up from your sleep:     Blood clot in your veins:    Leg swelling:         Pulmonary    Oxygen at home:    Productive cough:     Wheezing:         Neurologic    Sudden weakness in arms or legs:     Sudden numbness in arms or legs:     Sudden onset of difficulty speaking or slurred speech:    Temporary loss of vision in one eye:     Problems with dizziness:         Gastrointestinal    Blood in stool:     Vomited blood:         Genitourinary    Burning when urinating:     Blood in urine:        Psychiatric    Major depression:         Hematologic    Bleeding problems:    Problems with blood clotting too easily:        Skin    Rashes or ulcers:        Constitutional    Fever or chills:      PHYSICAL EXAMINATION:  ***  General:  WDWN in NAD; vital signs documented above Gait: Not observed HENT: WNL, normocephalic Pulmonary: normal non-labored breathing Cardiac: {Desc; regular/irreg:14544} HR, {With/Without:20273} carotid bruit*** Abdomen: soft, NT; aortic pulse is *** palpable Skin: {With/Without:20273} rashes Vascular Exam/Pulses:  Right Left  Radial {Exam; arterial pulse strength 0-4:30167} {Exam; arterial pulse strength 0-4:30167}  Popliteal {Exam; arterial pulse strength 0-4:30167} {Exam; arterial pulse strength 0-4:30167}  DP {Exam; arterial pulse strength 0-4:30167} {Exam; arterial pulse strength 0-4:30167}  PT {Exam; arterial pulse strength 0-4:30167} {Exam; arterial pulse strength 0-4:30167}   Extremities: {With/Without:20273} ischemic changes, {With/Without:20273} Gangrene , {With/Without:20273} cellulitis; {With/Without:20273} open wounds Musculoskeletal: no muscle wasting or atrophy  Neurologic: A&O X 3; moving all  extremities equally; speech is fluent/normal Psychiatric:  The pt has {Desc; normal/abnormal:11317::"Normal"} affect.   Non-Invasive Vascular Imaging:   Carotid Duplex on *** Right:  ***% ICA stenosis Left:  ***% ICA stenosis ***  Previous Carotid duplex on ***: Right: ***% ICA stenosis Left:   ***% ICA stenosis    ASSESSMENT/PLAN:: 88 y.o. male here for follow up carotid artery stenosis and s/p right TCAR for asymptomatic carotid artery stenosis on 04/25/2020 by Dr. Trula Slade.  His post op course  was prolonged due to post op bradycardia.  -duplex today reveals *** -discused s/s of stroke with pt and he understands should he develop any of these sx, he will go to the nearest ER or call 911. -pt will f/u in *** with carotid duplex -pt will call sooner should they have any issues. -continue plavix/asa   Leontine Locket, [facility] [phone]  Clinic MD:  Carlis Abbott

## 2022-01-21 ENCOUNTER — Ambulatory Visit: Payer: PPO | Admitting: Physician Assistant

## 2022-01-21 ENCOUNTER — Ambulatory Visit (HOSPITAL_COMMUNITY)
Admission: RE | Admit: 2022-01-21 | Discharge: 2022-01-21 | Disposition: A | Discharge status: Home / Self Care | Payer: PPO | Source: Ambulatory Visit | Attending: Vascular Surgery | Admitting: Vascular Surgery

## 2022-01-21 VITALS — BP 120/72 | HR 86 | Temp 98.1°F | Resp 20 | Ht 71.0 in | Wt 197.1 lb

## 2022-01-21 DIAGNOSIS — I6523 Occlusion and stenosis of bilateral carotid arteries: Secondary | ICD-10-CM | POA: Insufficient documentation

## 2022-01-23 ENCOUNTER — Encounter: Payer: Self-pay | Admitting: Internal Medicine

## 2022-01-23 ENCOUNTER — Encounter (HOSPITAL_BASED_OUTPATIENT_CLINIC_OR_DEPARTMENT_OTHER): Payer: Self-pay | Admitting: Cardiovascular Disease

## 2022-01-23 ENCOUNTER — Ambulatory Visit (INDEPENDENT_AMBULATORY_CARE_PROVIDER_SITE_OTHER): Payer: PPO | Admitting: Internal Medicine

## 2022-01-23 DIAGNOSIS — E669 Obesity, unspecified: Secondary | ICD-10-CM

## 2022-01-23 DIAGNOSIS — U071 COVID-19: Secondary | ICD-10-CM | POA: Diagnosis not present

## 2022-01-23 DIAGNOSIS — E1169 Type 2 diabetes mellitus with other specified complication: Secondary | ICD-10-CM | POA: Diagnosis not present

## 2022-01-23 DIAGNOSIS — H2513 Age-related nuclear cataract, bilateral: Secondary | ICD-10-CM | POA: Insufficient documentation

## 2022-01-23 DIAGNOSIS — M19032 Primary osteoarthritis, left wrist: Secondary | ICD-10-CM | POA: Insufficient documentation

## 2022-01-23 LAB — CBC WITH DIFFERENTIAL/PLATELET
Basophils Absolute: 0 10*3/uL (ref 0.0–0.1)
Basophils Relative: 0.6 % (ref 0.0–3.0)
Eosinophils Absolute: 0.2 10*3/uL (ref 0.0–0.7)
Eosinophils Relative: 3 % (ref 0.0–5.0)
HCT: 40 % (ref 39.0–52.0)
Hemoglobin: 13.5 g/dL (ref 13.0–17.0)
Lymphocytes Relative: 29.5 % (ref 12.0–46.0)
Lymphs Abs: 1.5 10*3/uL (ref 0.7–4.0)
MCHC: 33.8 g/dL (ref 30.0–36.0)
MCV: 90.1 fl (ref 78.0–100.0)
Monocytes Absolute: 1.2 10*3/uL — ABNORMAL HIGH (ref 0.1–1.0)
Monocytes Relative: 23.5 % — ABNORMAL HIGH (ref 3.0–12.0)
Neutro Abs: 2.2 10*3/uL (ref 1.4–7.7)
Neutrophils Relative %: 43.4 % (ref 43.0–77.0)
Platelets: 146 10*3/uL — ABNORMAL LOW (ref 150.0–400.0)
RBC: 4.45 Mil/uL (ref 4.22–5.81)
RDW: 14.5 % (ref 11.5–15.5)
WBC: 5.1 10*3/uL (ref 4.0–10.5)

## 2022-01-23 LAB — POC COVID19 BINAXNOW: SARS Coronavirus 2 Ag: POSITIVE — AB

## 2022-01-23 LAB — COMPREHENSIVE METABOLIC PANEL
ALT: 18 U/L (ref 0–53)
AST: 20 U/L (ref 0–37)
Albumin: 4.3 g/dL (ref 3.5–5.2)
Alkaline Phosphatase: 67 U/L (ref 39–117)
BUN: 15 mg/dL (ref 6–23)
CO2: 25 mEq/L (ref 19–32)
Calcium: 9.3 mg/dL (ref 8.4–10.5)
Chloride: 105 mEq/L (ref 96–112)
Creatinine, Ser: 1.01 mg/dL (ref 0.40–1.50)
GFR: 65.19 mL/min (ref >60.00)
Glucose, Bld: 132 mg/dL — ABNORMAL HIGH (ref 70–99)
Potassium: 4.2 mEq/L (ref 3.5–5.1)
Sodium: 139 mEq/L (ref 135–145)
Total Bilirubin: 0.8 mg/dL (ref 0.2–1.2)
Total Protein: 7.1 g/dL (ref 6.0–8.3)

## 2022-01-23 LAB — URINALYSIS
Bilirubin Urine: NEGATIVE
Hgb urine dipstick: NEGATIVE
Ketones, ur: NEGATIVE
Leukocytes,Ua: NEGATIVE
Nitrite: NEGATIVE
Specific Gravity, Urine: <=1.005 — AB (ref 1.000–1.030)
Total Protein, Urine: NEGATIVE
Urine Glucose: NEGATIVE
Urobilinogen, UA: 0.2 (ref 0.0–1.0)
pH: 6 (ref 5.0–8.0)

## 2022-01-23 MED ORDER — AZITHROMYCIN 250 MG PO TABS: ORAL_TABLET | ORAL | 0 refills | Status: DC

## 2022-01-23 MED ORDER — MOLNUPIRAVIR EUA 200MG CAPSULE: 4.0000 | ORAL_CAPSULE | Freq: Two times a day (BID) | ORAL | 0 refills | Status: AC

## 2022-01-23 NOTE — Assessment & Plan Note (Signed)
New glucometer given Last A1c was 6.5% at [place] 1 mo ago

## 2022-01-23 NOTE — Addendum Note (Signed)
Addended by: Marijean Heath R on: 01/23/2022 12:12 PM   Modules accepted: Orders

## 2022-01-23 NOTE — Patient Instructions (Signed)
For a mild COVID-19 case - take zinc 50 mg a day for 1 week, vitamin C 1000 mg daily for 1 week, vitamin D2 50,000 units weekly for 2 months (unless  taking vitamin D daily already), an antioxidant Quercetin 500 mg twice a day for 1 week (if you can get it quick enough). Take Allegra or Benadryl.  Maintain good oral hydration and take Tylenol for high fever.  Call if problems. Isolate for 5 days, then wear a mask for 5 days per CDC.  

## 2022-01-23 NOTE — Progress Notes (Signed)
Subjective:  Patient ID: Richard Burgess, male    DOB: [birth year]  Age: 88 y.o. MRN: [id number]  CC: No chief complaint on file.   HPI Richard Burgess presents for ST, not feeling well. Here w/Richard Burgess - his dtr F/u DM  Outpatient Medications Prior to Visit  Medication Sig Dispense Refill   albuterol (VENTOLIN HFA) 108 (90 Base) MCG/ACT inhaler INHALE 2 PUFFS BY MOUTH FOUR TIMES A DAY AS NEEDED FOR SHORTNESS OF BREATH     aspirin EC 81 MG tablet Take 81 mg by mouth daily.     busPIRone (BUSPAR) 10 MG tablet      carbamide peroxide (DEBROX) 6.5 % OTIC solution      Carboxymethylcellulose Sodium 1 % GEL APPLY 1 DROP TO LEFT EYE FOUR TIMES A DAY AS NEEDED     Cholecalciferol 25 MCG (1000 UT) tablet Take by mouth.     Cinnamon 500 MG capsule Take 2,000 mg by mouth daily.     clotrimazole (LOTRIMIN) 1 % cream APPLY SMALL AMOUNT TO AFFECTED AREA TWICE A DAY     cyanocobalamin 500 MCG tablet Take 500 mcg by mouth daily. Vitamin B12     docusate calcium (SURFAK) 240 MG capsule Take 240 mg by mouth at bedtime.     ibuprofen (ADVIL) 600 MG tablet Take by mouth.     isosorbide mononitrate (IMDUR) 60 MG 24 hr tablet Take 1 tablet (60 mg total) by mouth daily. 90 tablet 3   Lifitegrast 5 % SOLN INSTILL 1 DROP IN EACH EYE TWICE A DAY     losartan (COZAAR) 25 MG tablet TAKE ONE TABLET BY MOUTH EVERY MORNING FOR HEART     melatonin 3 MG TABS tablet Take 3 mg by mouth at bedtime.     Multiple Vitamins-Minerals (PRESERVISION AREDS 2) CAPS Take 1 capsule by mouth in the morning and at bedtime.     nitroGLYCERIN (NITROSTAT) 0.4 MG SL tablet Place 1 tablet (0.4 mg total) under the tongue every 5 (five) minutes as needed for chest pain. 20 tablet 1   pantoprazole (PROTONIX) 40 MG tablet TAKE ONE TABLET BY MOUTH TWICE A DAY (TAKE ON AN EMPTY STOMACH 30 MINUTES PRIOR TO A MEAL)     Polyethyl Glycol-Propyl Glycol (SYSTANE OP) Place 1 drop into both eyes 4 (four) times daily as needed (dry eyes).     Propylene  Glycol 0.6 % SOLN INSTILL 1 DROP IN EACH EYE FOUR TIMES A DAY     REPATHA SURECLICK 194 MG/ML SOAJ INJECT 140 MG INTO THE SKIN EVERY 14 (FOURTEEN) DAYS. PT. IS OVERDUE FOR FOLLOWUP. 6 mL 3   traMADol (ULTRAM) 50 MG tablet 1 tablet (50 mg total) every 6 (six) hours as needed.     tretinoin (RETIN-A) 0.1 % cream APPLY THIN LAYER TO AFFECTED AREA NIGHTLY AS DIRECTED. APPLY A SMALL AMOUNT TO THE BUMPS ON THE ARMS AND CHEST NIGHTLY     triamcinolone ointment (KENALOG) 0.1 % Apply 1 application topically 2 (two) times daily. 450 g 3   valACYclovir (VALTREX) 500 MG tablet Take 500 mg by mouth daily.     metFORMIN (GLUCOPHAGE-XR) 750 MG 24 hr tablet      clopidogrel (PLAVIX) 75 MG tablet Take 1 tablet by mouth daily. (Patient not taking: Reported on 11/18/2021)     Cholecalciferol (VITAMIN D) 50 MCG (2000 UT) tablet Take 2,000 Units by mouth daily with supper.     doxycycline (MONODOX) 100 MG capsule TAKE ONE CAPSULE BY MOUTH  TWICE A DAY FOR INFECTION (Patient not taking: Reported on 11/18/2021)     erythromycin ophthalmic ointment Use over your eyelid incisions four times per day for 10 days after surgery. (Patient not taking: Reported on 11/18/2021)     IBU 600 MG tablet Take by mouth. (Patient not taking: Reported on 11/18/2021)     losartan (COZAAR) 25 MG tablet TAKE 1 TABLET BY MOUTH DAILY 90 tablet 3   melatonin 1 MG TABS tablet Take by mouth.     methylPREDNISolone (MEDROL DOSEPAK) 4 MG TBPK tablet      tamsulosin (FLOMAX) 0.4 MG CAPS capsule Take 2 capsules by mouth at bedtime. (Patient not taking: Reported on 11/18/2021)     valACYclovir (VALTREX) 500 MG tablet Take 1 tablet by mouth daily. (Patient not taking: Reported on 11/18/2021)     vitamin B-12 (CYANOCOBALAMIN) 500 MCG tablet Take 1 tablet by mouth daily.     No facility-administered medications prior to visit.    ROS: Review of Systems  Objective:  BP 130/68 (BP Location: Left Arm, Patient Position: Sitting, Cuff Size: Large)   Pulse 61    Temp 97.8 F (36.6 C) (Oral)   Ht '5\' 11"'$  (1.803 m)   Wt 194 lb (88 kg)   SpO2 97%   BMI 27.06 kg/m   BP Readings from Last 3 Encounters:  01/23/22 130/68  01/21/22 120/72  10/16/21 110/60    Wt Readings from Last 3 Encounters:  01/23/22 194 lb (88 kg)  01/21/22 197 lb 1.6 oz (89.4 kg)  10/16/21 189 lb (85.7 kg)    Physical Exam  Lab Results  Component Value Date   WBC 7.9 04/12/2021   HGB 13.6 04/12/2021   HCT 40.3 04/12/2021   PLT 160.0 04/12/2021   GLUCOSE 117 (H) 10/16/2021   CHOL 108 04/12/2021   TRIG 109.0 04/12/2021   HDL 43.40 04/12/2021   LDLDIRECT 153.0 02/18/2016   LDLCALC 43 04/12/2021   ALT 16 10/16/2021   AST 19 10/16/2021   NA 137 10/16/2021   K 4.1 10/16/2021   CL 105 10/16/2021   CREATININE 0.99 10/16/2021   BUN 15 10/16/2021   CO2 25 10/16/2021   TSH 3.29 04/12/2021   PSA 0.28 03/23/2014   INR 1.0 04/20/2020   HGBA1C 6.8 (H) 10/16/2021   MICROALBUR 65 12/26/2020    VAS US CAROTID  Result Date: 01/21/2022 Carotid Arterial Duplex Study Patient Name:  Richard Burgess  Date of Exam:   01/21/2022 Medical Rec #: [id number]        Accession #:    [id number] Date of Birth: 02/03/1929         Patient Gender: M Patient Age:   44 years Exam Location:  [facility] Procedure:      VAS US CAROTID Referring Phys: Harold Barban --------------------------------------------------------------------------------  Indications:       Carotid artery disease and Right stent. Risk Factors:      Hypertension, hyperlipidemia. Other Factors:     TCAR 04/25/2020. Limitations        Today's exam was limited due to the patient's respiratory                    variation and patient movement. Comparison Study:  11/26/2020                    R=normal, L=1-39 Performing Technologist: Ronal Fear RVS, RCS  Examination Guidelines: A complete evaluation includes B-mode imaging, spectral Doppler, color Doppler, and  power Doppler as needed of all accessible portions of  each vessel. Bilateral testing is considered an integral part of a complete examination. Limited examinations for reoccurring indications may be performed as noted.  Right Carotid Findings: +----------+--------+--------+--------+------------------+--------+           PSV cm/sEDV cm/sStenosisPlaque DescriptionComments +----------+--------+--------+--------+------------------+--------+ CCA Prox  64      13              heterogenous               +----------+--------+--------+--------+------------------+--------+ CCA Mid   76      16              heterogenous               +----------+--------+--------+--------+------------------+--------+ ICA Prox                  Normal                    stent    +----------+--------+--------+--------+------------------+--------+ ICA Mid   91      23                                         +----------+--------+--------+--------+------------------+--------+ ICA Distal54      16                                         +----------+--------+--------+--------+------------------+--------+ ECA       271             >50%                               +----------+--------+--------+--------+------------------+--------+ +----------+--------+-------+----------------+-------------------+           PSV cm/sEDV cmsDescribe        Arm Pressure (mmHG) +----------+--------+-------+----------------+-------------------+ [id number]            Multiphasic, [id number]                 +----------+--------+-------+----------------+-------------------+ +---------+--------+--+--------+--+---------+ VertebralPSV cm/s50EDV cm/s16Antegrade +---------+--------+--+--------+--+---------+  Right Stent(s): +---------------+---+--++++ Prox to Stent  76 16 +---------------+---+--++++ Proximal Stent 10623 +---------------+---+--++++ Mid Stent      10925 +---------------+---+--++++ Distal Stent   88 20  +---------------+---+--++++ Distal to Stent91 23 +---------------+---+--++++   Left Carotid Findings: +----------+--------+--------+--------+------------------+--------+           PSV cm/sEDV cm/sStenosisPlaque DescriptionComments +----------+--------+--------+--------+------------------+--------+ CCA Prox  71      10                                         +----------+--------+--------+--------+------------------+--------+ CCA Mid   66      8                                          +----------+--------+--------+--------+------------------+--------+ CCA Distal68      13                                         +----------+--------+--------+--------+------------------+--------+  ICA Prox  61      13      1-39%   homogeneous                +----------+--------+--------+--------+------------------+--------+ ICA Mid   64      18                                         +----------+--------+--------+--------+------------------+--------+ ICA Distal63      17                                         +----------+--------+--------+--------+------------------+--------+ ECA       62                                                 +----------+--------+--------+--------+------------------+--------+ +----------+--------+--------+----------------+-------------------+           PSV cm/sEDV cm/sDescribe        Arm Pressure (mmHG) +----------+--------+--------+----------------+-------------------+ [id number]             Multiphasic, [id number]                 +----------+--------+--------+----------------+-------------------+ +---------+--------+--+--------+--+---------+ VertebralPSV cm/s44EDV cm/s12Antegrade +---------+--------+--+--------+--+---------+   Summary: Right Carotid: There is no evidence of stenosis in the right ICA. Widely patent                stent. Left Carotid: There is no evidence of stenosis in the left ICA. Vertebrals:  Bilateral  vertebral arteries demonstrate antegrade flow. Subclavians: Normal flow hemodynamics were seen in bilateral subclavian              arteries. *See table(s) above for measurements and observations.  Electronically signed by Monica Martinez MD on 01/21/2022 at 3:57:30 PM.    Final     Assessment & Plan:   Problem List Items Addressed This Visit     COVID-19    Molnupiravir po Zpac       Relevant Medications   molnupiravir EUA (LAGEVRIO) 200 mg CAPS capsule   azithromycin (ZITHROMAX Z-PAK) 250 MG tablet   Other Relevant Orders   CBC with Differential/Platelet   Comprehensive metabolic panel   Urinalysis   Diabetes mellitus type 2 in obese ([place])    New glucometer given Last A1c was 6.5% at [facility] 1 mo ago      Relevant Orders   Comprehensive metabolic panel      Meds ordered this encounter  Medications   molnupiravir EUA (LAGEVRIO) 200 mg CAPS capsule    Sig: Take 4 capsules (800 mg total) by mouth 2 (two) times daily for 5 days.    Dispense:  40 capsule    Refill:  0   azithromycin (ZITHROMAX Z-PAK) 250 MG tablet    Sig: As directed    Dispense:  6 tablet    Refill:  0      Follow-up: Return in about 3 months (around 04/25/2022) for a follow-up visit.  [name], MD

## 2022-01-23 NOTE — Assessment & Plan Note (Signed)
Molnupiravir po Zpac

## 2022-01-24 NOTE — Telephone Encounter (Signed)
Looks like patient has not been seen sine 11/21 and report not taking plavix on 6/23, should patient be taking it!

## 2022-03-25 ENCOUNTER — Ambulatory Visit (INDEPENDENT_AMBULATORY_CARE_PROVIDER_SITE_OTHER): Payer: PPO | Admitting: Internal Medicine

## 2022-03-25 VITALS — BP 122/60 | HR 75 | Temp 97.9°F | Ht 71.0 in | Wt 194.0 lb

## 2022-03-25 DIAGNOSIS — J309 Allergic rhinitis, unspecified: Secondary | ICD-10-CM | POA: Diagnosis not present

## 2022-03-25 DIAGNOSIS — R739 Hyperglycemia, unspecified: Secondary | ICD-10-CM | POA: Diagnosis not present

## 2022-03-25 DIAGNOSIS — I10 Essential (primary) hypertension: Secondary | ICD-10-CM

## 2022-03-25 DIAGNOSIS — H6991 Unspecified Eustachian tube disorder, right ear: Secondary | ICD-10-CM | POA: Diagnosis not present

## 2022-03-25 NOTE — Patient Instructions (Signed)
Please take all new medication as prescribed - the zyrtec, nasacort and the Mucinex as discussed (all OTC)  Please continue all other medications as before, and refills have been done if requested.  Please have the pharmacy call with any other refills you may need.  Please keep your appointments with your specialists as you may have planned

## 2022-03-25 NOTE — Progress Notes (Unsigned)
Patient ID: Richard Burgess, male   DOB: [birth year], 88 y.o.   MRN: [id number]        Chief Complaint: follow up allergies, right ear fullness, hyperglycemia        HPI:  Richard Burgess is a 88 y.o. male here overall doing ok, except Does have several wks ongoing nasal allergy symptoms with clearish congestion, itch and sneezing, without fever, pain, ST, cough, swelling or wheezing.   Pt denies polydipsia, polyuria, or new focal neuro s/s.   Pt denies chest pain, increased sob or doe, wheezing, orthopnea, PND, increased LE swelling, palpitations, dizziness or syncope.  Decliens a1c today as has this done at the [place].         Wt Readings from Last 3 Encounters:  03/25/22 194 lb (88 kg)  01/23/22 194 lb (88 kg)  01/21/22 197 lb 1.6 oz (89.4 kg)   BP Readings from Last 3 Encounters:  03/25/22 122/60  01/23/22 130/68  01/21/22 120/72         Past Medical History:  Diagnosis Date   Allergy    BPH (benign prostatic hypertrophy)    Carotid artery occlusion    Diverticulosis of colon    ED (erectile dysfunction)    GERD (gastroesophageal reflux disease)    Glucose intolerance (impaired glucose tolerance)    Hearing loss    no hearing aids   History of pancreatitis    HSV infection    left eye   Hx-TIA (transient ischemic attack)    Hyperlipidemia    Hypertension    Hypertention, malignant, with acute intensive management    Low back pain    Osteoarthritis    PVD (peripheral vascular disease) ([place])    Bilateral carotid   Tubular adenoma of colon 08/1991   One with carcinoma IN [facility] 1993, multiple adenomatous   Wears partial dentures    upper   Past Surgical History:  Procedure Laterality Date   APPENDECTOMY     BASAL CELL CARCINOMA EXCISION     check and ear lobe   BLEPHAROPLASTY Left    x 3   CHOLECYSTECTOMY     COLONOSCOPY     ESOPHAGOGASTRODUODENOSCOPY     LEFT HEART CATH AND CORONARY ANGIOGRAPHY N/A 01/26/2017   Procedure: LEFT HEART CATH AND CORONARY ANGIOGRAPHY;   Surgeon: Lorretta Harp, MD;  Location: [facility];  Service: Cardiovascular;  Laterality: N/A;   LUMBAR SPINE SURGERY     X4   TRANSCAROTID ARTERY REVASCULARIZATION  Right 04/25/2020   Procedure: RIGHT TRANSCAROTID ARTERY REVASCULARIZATION;  Surgeon: Serafina Mitchell, MD;  Location: [facility];  Service: Vascular;  Laterality: Right;   ULTRASOUND GUIDANCE FOR VASCULAR ACCESS Right 04/25/2020   Procedure: ULTRASOUND GUIDANCE FOR VASCULAR ACCESS;  Surgeon: Serafina Mitchell, MD;  Location: [facility] OR;  Service: Vascular;  Laterality: Right;    reports that he quit smoking about 53 years ago. His smoking use included cigarettes. He started smoking about 73 years ago. He has a 10.00 pack-year smoking history. He has never been exposed to tobacco smoke. He has never used smokeless tobacco. He reports current alcohol use of about 1.0 - 2.0 standard drink of alcohol per week. He reports that he does not use drugs. family history includes Colon cancer in his sister; Dementia in his father; Diabetes in his mother; Peripheral vascular disease in his brother. Allergies  Allergen Reactions   Bee Venom Swelling   Amlodipine Other (See Comments)    Weakness in legs  Livalo [Pitavastatin]     Pain, SOB   Oxycodone Other (See Comments)    crazy   Pravastatin Sodium Other (See Comments)    REACTION: bad dreams   Zetia [Ezetimibe]     LEG PAIN    Rosuvastatin Palpitations   Statins Palpitations and Other (See Comments)    REACTION: aches, bad dreams   Current Outpatient Medications on File Prior to Visit  Medication Sig Dispense Refill   albuterol (VENTOLIN HFA) 108 (90 Base) MCG/ACT inhaler INHALE 2 PUFFS BY MOUTH FOUR TIMES A DAY AS NEEDED FOR SHORTNESS OF BREATH     aspirin EC 81 MG tablet Take 81 mg by mouth daily.     azithromycin (ZITHROMAX Z-PAK) 250 MG tablet As directed 6 tablet 0   busPIRone (BUSPAR) 10 MG tablet      carbamide peroxide (DEBROX) 6.5 % OTIC solution       Carboxymethylcellulose Sodium 1 % GEL APPLY 1 DROP TO LEFT EYE FOUR TIMES A DAY AS NEEDED     Cholecalciferol 25 MCG (1000 UT) tablet Take by mouth.     Cinnamon 500 MG capsule Take 2,000 mg by mouth daily.     clotrimazole (LOTRIMIN) 1 % cream APPLY SMALL AMOUNT TO AFFECTED AREA TWICE A DAY     cyanocobalamin 500 MCG tablet Take 500 mcg by mouth daily. Vitamin B12     docusate calcium (SURFAK) 240 MG capsule Take 240 mg by mouth at bedtime.     ibuprofen (ADVIL) 600 MG tablet Take by mouth.     isosorbide mononitrate (IMDUR) 60 MG 24 hr tablet Take 1 tablet (60 mg total) by mouth daily. 90 tablet 3   Lifitegrast 5 % SOLN INSTILL 1 DROP IN EACH EYE TWICE A DAY     losartan (COZAAR) 25 MG tablet TAKE ONE TABLET BY MOUTH EVERY MORNING FOR HEART     melatonin 3 MG TABS tablet Take 3 mg by mouth at bedtime.     Multiple Vitamins-Minerals (PRESERVISION AREDS 2) CAPS Take 1 capsule by mouth in the morning and at bedtime.     nitroGLYCERIN (NITROSTAT) 0.4 MG SL tablet Place 1 tablet (0.4 mg total) under the tongue every 5 (five) minutes as needed for chest pain. 20 tablet 1   pantoprazole (PROTONIX) 40 MG tablet TAKE ONE TABLET BY MOUTH TWICE A DAY (TAKE ON AN EMPTY STOMACH 30 MINUTES PRIOR TO A MEAL)     Polyethyl Glycol-Propyl Glycol (SYSTANE OP) Place 1 drop into both eyes 4 (four) times daily as needed (dry eyes).     Propylene Glycol 0.6 % SOLN INSTILL 1 DROP IN EACH EYE FOUR TIMES A DAY     REPATHA SURECLICK 283 MG/ML SOAJ INJECT 140 MG INTO THE SKIN EVERY 14 (FOURTEEN) DAYS. PT. IS OVERDUE FOR FOLLOWUP. 6 mL 3   traMADol (ULTRAM) 50 MG tablet 1 tablet (50 mg total) every 6 (six) hours as needed.     tretinoin (RETIN-A) 0.1 % cream APPLY THIN LAYER TO AFFECTED AREA NIGHTLY AS DIRECTED. APPLY A SMALL AMOUNT TO THE BUMPS ON THE ARMS AND CHEST NIGHTLY     triamcinolone ointment (KENALOG) 0.1 % Apply 1 application topically 2 (two) times daily. 450 g 3   valACYclovir (VALTREX) 500 MG tablet Take 500  mg by mouth daily.     No current facility-administered medications on file prior to visit.        ROS:  All others reviewed and negative.  Objective  PE:  BP 122/60 (BP Location: Right Arm, Patient Position: Sitting, Cuff Size: Large)   Pulse 75   Temp 97.9 F (36.6 C) (Oral)   Ht '5\' 11"'$  (1.803 m)   Wt 194 lb (88 kg)   SpO2 97%   BMI 27.06 kg/m                 Constitutional: Pt appears in NAD               HENT: Head: NCAT.                Right Ear: External ear normal.                 Left Ear: External ear normal.   Bilat tm's with mild erythema.  Max sinus areas non tender.  Pharynx with mild erythema, no exudate               Eyes: . Pupils are equal, round, and reactive to light. Conjunctivae and EOM are normal               Nose: without d/c or deformity               Neck: Neck supple. Gross normal ROM               Cardiovascular: Normal rate and regular rhythm.                 Pulmonary/Chest: Effort normal and breath sounds without rales or wheezing.                Abd:  Soft, NT, ND, + BS, no organomegaly               Neurological: Pt is alert. At baseline orientation, motor grossly intact               Skin: Skin is warm. No rashes, no other new lesions, LE edema - none               Psychiatric: Pt behavior is normal without agitation   Micro: none  Cardiac tracings I have personally interpreted today:  none  Pertinent Radiological findings (summarize): none   Lab Results  Component Value Date   WBC 5.1 01/23/2022   HGB 13.5 01/23/2022   HCT 40.0 01/23/2022   PLT 146.0 (L) 01/23/2022   GLUCOSE 132 (H) 01/23/2022   CHOL 108 04/12/2021   TRIG 109.0 04/12/2021   HDL 43.40 04/12/2021   LDLDIRECT 153.0 02/18/2016   LDLCALC 43 04/12/2021   ALT 18 01/23/2022   AST 20 01/23/2022   NA 139 01/23/2022   K 4.2 01/23/2022   CL 105 01/23/2022   CREATININE 1.01 01/23/2022   BUN 15 01/23/2022   CO2 25 01/23/2022   TSH 3.29 04/12/2021   PSA 0.28  03/23/2014   INR 1.0 04/20/2020   HGBA1C 6.8 (H) 10/16/2021   MICROALBUR 65 12/26/2020   Assessment/Plan:  Richard Burgess is a 88 y.o. White or Caucasian [1] male with  has a past medical history of Allergy, BPH (benign prostatic hypertrophy), Carotid artery occlusion, Diverticulosis of colon, ED (erectile dysfunction), GERD (gastroesophageal reflux disease), Glucose intolerance (impaired glucose tolerance), Hearing loss, History of pancreatitis, HSV infection, TIA (transient ischemic attack), Hyperlipidemia, Hypertension, Hypertention, malignant, with acute intensive management, Low back pain, Osteoarthritis, PVD (peripheral vascular disease) ([place]), Tubular adenoma of colon (08/1991), and Wears partial dentures.  Acute dysfunction of right eustachian tube Mild to mod,  for mucinex bid prn,  to f/u any worsening symptoms or concerns  Allergic rhinitis Mild to mod, for new start zyrtec 10 mg qd, and nasacort qd asd,  to f/u any worsening symptoms or concerns    Essential hypertension BP Readings from Last 3 Encounters:  03/25/22 122/60  01/23/22 130/68  01/21/22 120/72   Stable, pt to continue medical treatment losartan 25 mg qd   Hyperglycemia Lab Results  Component Value Date   HGBA1C 6.8 (H) 10/16/2021   Mild uncontrolled, pt declines A1c or change in tx today, pt to continue current medical treatment  - diet, wt control,   Followup: Return if symptoms worsen or fail to improve.  Cathlean Cower, MD 03/26/2022 7:28 PM [facility]

## 2022-03-26 ENCOUNTER — Encounter: Payer: Self-pay | Admitting: Internal Medicine

## 2022-03-26 DIAGNOSIS — H6991 Unspecified Eustachian tube disorder, right ear: Secondary | ICD-10-CM | POA: Insufficient documentation

## 2022-03-26 NOTE — Assessment & Plan Note (Signed)
Mild to mod, for new start zyrtec 10 mg qd, and nasacort qd asd,  to f/u any worsening symptoms or concerns

## 2022-03-26 NOTE — Assessment & Plan Note (Signed)
BP Readings from Last 3 Encounters:  03/25/22 122/60  01/23/22 130/68  01/21/22 120/72   Stable, pt to continue medical treatment losartan 25 mg qd

## 2022-03-26 NOTE — Assessment & Plan Note (Signed)
Mild to mod, for mucinex bid prn,  to f/u any worsening symptoms or concerns

## 2022-03-26 NOTE — Assessment & Plan Note (Signed)
Lab Results  Component Value Date   HGBA1C 6.8 (H) 10/16/2021   Mild uncontrolled, pt declines A1c or change in tx today, pt to continue current medical treatment  - diet, wt control,

## 2022-04-14 ENCOUNTER — Encounter (HOSPITAL_BASED_OUTPATIENT_CLINIC_OR_DEPARTMENT_OTHER): Payer: Self-pay | Admitting: Cardiovascular Disease

## 2022-04-14 DIAGNOSIS — I2510 Atherosclerotic heart disease of native coronary artery without angina pectoris: Secondary | ICD-10-CM

## 2022-04-14 NOTE — Telephone Encounter (Signed)
Please advise 

## 2022-04-15 NOTE — Telephone Encounter (Signed)
Attempted to return call to patient, no answer, left message to either have pts. Recent labs faxed to [place] or to call and [facility] know [facility] so we can contact them.

## 2022-04-16 ENCOUNTER — Telehealth (HOSPITAL_BASED_OUTPATIENT_CLINIC_OR_DEPARTMENT_OTHER): Payer: Self-pay | Admitting: Cardiovascular Disease

## 2022-04-16 NOTE — Telephone Encounter (Signed)
Daughter call back to say patients goes to the [place]. Please advise

## 2022-04-17 NOTE — Telephone Encounter (Signed)
Printed and given to TR 11/9

## 2022-04-17 NOTE — Telephone Encounter (Signed)
Labs requested and reviewed Will have Dr Oval Linsey review

## 2022-04-17 NOTE — Telephone Encounter (Signed)
Spoke with [name]. Labs requested and received  Printed for Dr Oval Linsey to review

## 2022-04-24 NOTE — Telephone Encounter (Signed)
Labs from [facility] 12/23/2021 Cholesterol 103 Triglycerides  273 HDL   43 C-LDL  5  Will forward to Pharm D for review

## 2022-04-28 NOTE — Telephone Encounter (Signed)
Dr. Oval Linsey patient would like your input as well.

## 2022-04-29 NOTE — Addendum Note (Signed)
Addended by: Gerald Stabs on: 04/29/2022 05:17 PM   Modules accepted: Orders

## 2022-04-30 NOTE — Telephone Encounter (Signed)
See 11/6 mychart message

## 2022-05-08 DIAGNOSIS — I2510 Atherosclerotic heart disease of native coronary artery without angina pectoris: Secondary | ICD-10-CM | POA: Diagnosis not present

## 2022-05-09 LAB — LDL CHOLESTEROL, DIRECT: LDL Direct: 48 mg/dL (ref 0–99)

## 2022-05-17 ENCOUNTER — Encounter (HOSPITAL_BASED_OUTPATIENT_CLINIC_OR_DEPARTMENT_OTHER): Payer: Self-pay | Admitting: Cardiovascular Disease

## 2022-05-23 ENCOUNTER — Encounter (HOSPITAL_BASED_OUTPATIENT_CLINIC_OR_DEPARTMENT_OTHER): Payer: Self-pay

## 2022-07-09 ENCOUNTER — Other Ambulatory Visit (HOSPITAL_BASED_OUTPATIENT_CLINIC_OR_DEPARTMENT_OTHER): Payer: Self-pay | Admitting: Cardiovascular Disease

## 2022-11-06 ENCOUNTER — Encounter: Payer: Self-pay | Admitting: Internal Medicine

## 2022-11-06 ENCOUNTER — Ambulatory Visit (INDEPENDENT_AMBULATORY_CARE_PROVIDER_SITE_OTHER): Payer: PPO | Admitting: Internal Medicine

## 2022-11-06 VITALS — BP 118/70 | HR 72 | Temp 98.0°F | Ht 71.0 in | Wt 199.0 lb

## 2022-11-06 DIAGNOSIS — L304 Erythema intertrigo: Secondary | ICD-10-CM | POA: Diagnosis not present

## 2022-11-06 DIAGNOSIS — L603 Nail dystrophy: Secondary | ICD-10-CM | POA: Insufficient documentation

## 2022-11-06 DIAGNOSIS — I10 Essential (primary) hypertension: Secondary | ICD-10-CM | POA: Diagnosis not present

## 2022-11-06 DIAGNOSIS — Z961 Presence of intraocular lens: Secondary | ICD-10-CM | POA: Insufficient documentation

## 2022-11-06 DIAGNOSIS — R102 Pelvic and perineal pain: Secondary | ICD-10-CM | POA: Diagnosis not present

## 2022-11-06 DIAGNOSIS — Z48810 Encounter for surgical aftercare following surgery on the sense organs: Secondary | ICD-10-CM | POA: Insufficient documentation

## 2022-11-06 DIAGNOSIS — R739 Hyperglycemia, unspecified: Secondary | ICD-10-CM

## 2022-11-06 MED ORDER — CLOTRIMAZOLE-BETAMETHASONE 1-0.05 % EX CREA: 1.0000 | TOPICAL_CREAM | Freq: Every day | CUTANEOUS | 0 refills | Status: AC

## 2022-11-06 MED ORDER — DOXYCYCLINE HYCLATE 100 MG PO TABS: 100.0000 mg | ORAL_TABLET | Freq: Two times a day (BID) | ORAL | 0 refills | Status: DC

## 2022-11-06 NOTE — Progress Notes (Signed)
Patient ID: Richard Burgess, male   DOB: 1928-06-22, 89 y.o.   MRN: [id number]        Chief Complaint: follow up pelvic pain, right groin rash intertrigo, htn, hyperglycemia       HPI:  Richard Burgess is a 89 y.o. male here with family with right groin rash with itch and large area redness, but also 1 wk pelvic pain behind the scrotal area primarily, dull, intermittent, now mod to occasionally severe.  Denies urinary symptoms such as dysuria, frequency, urgency, flank pain, hematuria or n/v, fever, chills.  .Denies worsening reflux, abd pain, dysphagia, n/v, bowel change or blood.  Pt denies chest pain, increased sob or doe, wheezing, orthopnea, PND, increased LE swelling, palpitations, dizziness or syncope.   Pt denies polydipsia, polyuria, or new focal neuro s/s.    Pt denies fever, wt loss, night sweats, loss of appetite, or other constitutional symptoms         Wt Readings from Last 3 Encounters:  11/06/22 199 lb (90.3 kg)  03/25/22 194 lb (88 kg)  01/23/22 194 lb (88 kg)   BP Readings from Last 3 Encounters:  11/06/22 118/70  03/25/22 122/60  01/23/22 130/68         Past Medical History:  Diagnosis Date   Allergy    BPH (benign prostatic hypertrophy)    Carotid artery occlusion    Diverticulosis of colon    ED (erectile dysfunction)    GERD (gastroesophageal reflux disease)    Glucose intolerance (impaired glucose tolerance)    Hearing loss    no hearing aids   History of pancreatitis    HSV infection    left eye   Hx-TIA (transient ischemic attack)    Hyperlipidemia    Hypertension    Hypertention, malignant, with acute intensive management    Low back pain    Osteoarthritis    PVD (peripheral vascular disease) (HCC)    Bilateral carotid   Tubular adenoma of colon 08/1991   One with carcinoma IN [facility] 1993, multiple adenomatous   Wears partial dentures    upper   Past Surgical History:  Procedure Laterality Date   APPENDECTOMY     BASAL CELL CARCINOMA EXCISION      check and ear lobe   BLEPHAROPLASTY Left    x 3   CHOLECYSTECTOMY     COLONOSCOPY     ESOPHAGOGASTRODUODENOSCOPY     LEFT HEART CATH AND CORONARY ANGIOGRAPHY N/A 01/26/2017   Procedure: LEFT HEART CATH AND CORONARY ANGIOGRAPHY;  Surgeon: Runell Gess, MD;  Location: [facility];  Service: Cardiovascular;  Laterality: N/A;   LUMBAR SPINE SURGERY     X4   TRANSCAROTID ARTERY REVASCULARIZATION  Right 04/25/2020   Procedure: RIGHT TRANSCAROTID ARTERY REVASCULARIZATION;  Surgeon: Nada Libman, MD;  Location: [facility] OR;  Service: Vascular;  Laterality: Right;   ULTRASOUND GUIDANCE FOR VASCULAR ACCESS Right 04/25/2020   Procedure: ULTRASOUND GUIDANCE FOR VASCULAR ACCESS;  Surgeon: Nada Libman, MD;  Location: [facility] OR;  Service: Vascular;  Laterality: Right;    reports that he quit smoking about 54 years ago. His smoking use included cigarettes. He started smoking about 74 years ago. He has a 10.00 pack-year smoking history. He has never been exposed to tobacco smoke. He has never used smokeless tobacco. He reports current alcohol use of about 1.0 - 2.0 standard drink of alcohol per week. He reports that he does not use drugs. family history includes Colon cancer in  his sister; Dementia in his father; Diabetes in his mother; Peripheral vascular disease in his brother. Allergies  Allergen Reactions   Bee Venom Swelling   Amlodipine Other (See Comments)    Weakness in legs   Livalo [Pitavastatin]     Pain, SOB   Oxycodone Other (See Comments)    crazy   Pravastatin Sodium Other (See Comments)    REACTION: bad dreams   Zetia [Ezetimibe]     LEG PAIN    Rosuvastatin Palpitations   Statins Palpitations and Other (See Comments)    REACTION: aches, bad dreams   Current Outpatient Medications on File Prior to Visit  Medication Sig Dispense Refill   albuterol (VENTOLIN HFA) 108 (90 Base) MCG/ACT inhaler INHALE 2 PUFFS BY MOUTH FOUR TIMES A DAY AS NEEDED FOR SHORTNESS OF BREATH      aspirin EC 81 MG tablet Take 81 mg by mouth daily.     busPIRone (BUSPAR) 10 MG tablet      busPIRone (BUSPAR) 10 MG tablet TAKE ONE-HALF TABLET BY MOUTH DAILY AS NEEDED FOR ANXIETY     Cholecalciferol 25 MCG (1000 UT) tablet Take by mouth.     Cinnamon 500 MG capsule Take 2,000 mg by mouth daily.     clotrimazole (LOTRIMIN) 1 % cream APPLY SMALL AMOUNT TO AFFECTED AREA TWICE A DAY     cyanocobalamin 500 MCG tablet Take 500 mcg by mouth daily. Vitamin B12     docusate calcium (SURFAK) 240 MG capsule Take 240 mg by mouth at bedtime.     Evolocumab (REPATHA SURECLICK) 140 MG/ML SOAJ INJECT 140 MG INTO THE SKIN EVERY 14 (FOURTEEN) DAYS. PT. IS OVERDUE FOR FOLLOWUP. 6 mL 3   ibuprofen (ADVIL) 600 MG tablet Take by mouth.     isosorbide mononitrate (IMDUR) 60 MG 24 hr tablet Take 1 tablet (60 mg total) by mouth daily. 90 tablet 3   Lifitegrast 5 % SOLN INSTILL 1 DROP IN EACH EYE TWICE A DAY     losartan (COZAAR) 25 MG tablet TAKE ONE TABLET BY MOUTH EVERY MORNING FOR HEART     melatonin 3 MG TABS tablet Take 3 mg by mouth at bedtime.     Multiple Vitamins-Minerals (PRESERVISION AREDS 2) CAPS Take 1 capsule by mouth in the morning and at bedtime.     nitroGLYCERIN (NITROSTAT) 0.4 MG SL tablet Place 1 tablet (0.4 mg total) under the tongue every 5 (five) minutes as needed for chest pain. 20 tablet 1   pantoprazole (PROTONIX) 40 MG tablet TAKE ONE TABLET BY MOUTH TWICE A DAY (TAKE ON AN EMPTY STOMACH 30 MINUTES PRIOR TO A MEAL)     Polyethyl Glycol-Propyl Glycol (SYSTANE OP) Place 1 drop into both eyes 4 (four) times daily as needed (dry eyes).     Propylene Glycol 0.6 % SOLN INSTILL 1 DROP IN EACH EYE FOUR TIMES A DAY     traMADol (ULTRAM) 50 MG tablet 1 tablet (50 mg total) every 6 (six) hours as needed.     tretinoin (RETIN-A) 0.1 % cream APPLY THIN LAYER TO AFFECTED AREA NIGHTLY AS DIRECTED. APPLY A SMALL AMOUNT TO THE BUMPS ON THE ARMS AND CHEST NIGHTLY     triamcinolone ointment (KENALOG) 0.1 %  Apply 1 application topically 2 (two) times daily. 450 g 3   valACYclovir (VALTREX) 500 MG tablet Take 500 mg by mouth daily.     No current facility-administered medications on file prior to visit.        ROS:  All others reviewed and negative.  Objective        PE:  BP 118/70 (BP Location: Right Arm, Patient Position: Sitting, Cuff Size: Normal)   Pulse 72   Temp 98 F (36.7 C) (Oral)   Ht 5\' 11"  (1.803 m)   Wt 199 lb (90.3 kg)   SpO2 97%   BMI 27.75 kg/m                 Constitutional: Pt appears in NAD               HENT: Head: NCAT.                Right Ear: External ear normal.                 Left Ear: External ear normal.                Eyes: . Pupils are equal, round, and reactive to light. Conjunctivae and EOM are normal               Nose: without d/c or deformity               Neck: Neck supple. Gross normal ROM               Cardiovascular: Normal rate and regular rhythm.                 Pulmonary/Chest: Effort normal and breath sounds without rales or wheezing.                Abd:  Soft, NT, ND, + BS, no organomegaly               Neurological: Pt is alert. At baseline orientation, motor grossly intact               Skin: Skin is warm. No rashes, no other new lesions, LE edema - none               Psychiatric: Pt behavior is normal without agitation   Micro: none  Cardiac tracings I have personally interpreted today:  none  Pertinent Radiological findings (summarize): none   Lab Results  Component Value Date   WBC 5.1 01/23/2022   HGB 13.5 01/23/2022   HCT 40.0 01/23/2022   PLT 146.0 (L) 01/23/2022   GLUCOSE 132 (H) 01/23/2022   CHOL 108 04/12/2021   TRIG 109.0 04/12/2021   HDL 43.40 04/12/2021   LDLDIRECT 48 05/08/2022   LDLCALC 43 04/12/2021   ALT 18 01/23/2022   AST 20 01/23/2022   NA 139 01/23/2022   K 4.2 01/23/2022   CL 105 01/23/2022   CREATININE 1.01 01/23/2022   BUN 15 01/23/2022   CO2 25 01/23/2022   TSH 3.29 04/12/2021   PSA 0.28  03/23/2014   INR 1.0 04/20/2020   HGBA1C 6.8 (H) 10/16/2021   MICROALBUR 65 12/26/2020   Assessment/Plan:  Richard Burgess is a 89 y.o. White or Caucasian [1] male with  has a past medical history of Allergy, BPH (benign prostatic hypertrophy), Carotid artery occlusion, Diverticulosis of colon, ED (erectile dysfunction), GERD (gastroesophageal reflux disease), Glucose intolerance (impaired glucose tolerance), Hearing loss, History of pancreatitis, HSV infection, TIA (transient ischemic attack), Hyperlipidemia, Hypertension, Hypertention, malignant, with acute intensive management, Low back pain, Osteoarthritis, PVD (peripheral vascular disease) (HCC), Tubular adenoma of colon (08/1991), and Wears partial dentures.  Intertrigo Ok for antifungal cream to use asd  Essential hypertension BP Readings from  Last 3 Encounters:  11/06/22 118/70  03/25/22 122/60  01/23/22 130/68   Stable, pt to continue medical treatment losartan 25 qd   Hyperglycemia Lab Results  Component Value Date   HGBA1C 6.8 (H) 10/16/2021   Mld uncontrolled, pt for wt control, diet, declines oha for now given age   Pelvic pain High suspicion for acute prostatitis, for doxycyclinne 100 bid,  to f/u any worsening symptoms or concerns  Followup: Return if symptoms worsen or fail to improve.  Oliver Barre, MD 11/08/2022 7:07 PM [facility]

## 2022-11-06 NOTE — Patient Instructions (Signed)
Please take all new medication as prescribed  - the antibiotic for probable prostatitis, and possible skin infection, and the cream for the right groin area as directed  Please continue all other medications as before, and refills have been done if requested.  Please have the pharmacy call with any other refills you may need.  Please keep your appointments with your specialists as you may have planned

## 2022-11-08 ENCOUNTER — Encounter: Payer: Self-pay | Admitting: Internal Medicine

## 2022-11-08 DIAGNOSIS — R21 Rash and other nonspecific skin eruption: Secondary | ICD-10-CM | POA: Insufficient documentation

## 2022-11-08 DIAGNOSIS — R102 Pelvic and perineal pain: Secondary | ICD-10-CM | POA: Insufficient documentation

## 2022-11-08 NOTE — Assessment & Plan Note (Signed)
Lab Results  Component Value Date   HGBA1C 6.8 (H) 10/16/2021   Mld uncontrolled, pt for wt control, diet, declines oha for now given age

## 2022-11-08 NOTE — Assessment & Plan Note (Signed)
High suspicion for acute prostatitis, for doxycyclinne 100 bid,  to f/u any worsening symptoms or concerns

## 2022-11-08 NOTE — Assessment & Plan Note (Signed)
Ok for antifungal cream to use asd

## 2022-11-08 NOTE — Assessment & Plan Note (Signed)
BP Readings from Last 3 Encounters:  11/06/22 118/70  03/25/22 122/60  01/23/22 130/68   Stable, pt to continue medical treatment losartan 25 qd

## 2022-11-14 ENCOUNTER — Ambulatory Visit: Payer: PPO | Admitting: Internal Medicine

## 2022-11-19 ENCOUNTER — Encounter: Payer: Self-pay | Admitting: Internal Medicine

## 2022-11-20 NOTE — Telephone Encounter (Signed)
Patient's daughter Olegario Messier called to check on the status of their request. She would like a call back at [phone].

## 2022-11-24 ENCOUNTER — Other Ambulatory Visit: Payer: Self-pay | Admitting: Internal Medicine

## 2022-11-24 ENCOUNTER — Ambulatory Visit (INDEPENDENT_AMBULATORY_CARE_PROVIDER_SITE_OTHER): Payer: PPO

## 2022-11-24 VITALS — Ht 71.0 in | Wt 199.0 lb

## 2022-11-24 DIAGNOSIS — N411 Chronic prostatitis: Secondary | ICD-10-CM

## 2022-11-24 DIAGNOSIS — Z0000 Encounter for general adult medical examination without abnormal findings: Secondary | ICD-10-CM

## 2022-11-24 DIAGNOSIS — R102 Pelvic and perineal pain: Secondary | ICD-10-CM

## 2022-11-24 NOTE — Patient Instructions (Addendum)
Mr. Richard Burgess , Thank you for taking time to come for your Medicare Wellness Visit. I appreciate your ongoing commitment to your health goals. Please review the following plan we discussed and let me know if I can assist you in the future.   These are the goals we discussed:  Goals      Client understands the importance of follow-up with providers by attending scheduled visits        This is a list of the screening recommended for you and due dates:  Health Maintenance  Topic Date Due   Complete foot exam   Never done   COVID-19 Vaccine (4 - 2023-24 season) 02/07/2022   Hemoglobin A1C  04/18/2022   Flu Shot  01/08/2023   Eye exam for diabetics  08/03/2023   Medicare Annual Wellness Visit  11/24/2023   DTaP/Tdap/Td vaccine (5 - Td or Tdap) 12/21/2030   Pneumonia Vaccine  Completed   Zoster (Shingles) Vaccine  Completed   HPV Vaccine  Aged Out    Advanced directives: Yes; documents on file.  Conditions/risks identified: Yes  Next appointment: 11/26/2023 at 8:45 a.m. telephone visit with Percell Miller, Nurse Health Advisor.  If you need to reschedule or cancel, please call [phone].  Preventive Care 44 Years and Older, Male  Preventive care refers to lifestyle choices and visits with your health care provider that can promote health and wellness. What does preventive care include? A yearly physical exam. This is also called an annual well check. Dental exams once or twice a year. Routine eye exams. Ask your health care provider how often you should have your eyes checked. Personal lifestyle choices, including: Daily care of your teeth and gums. Regular physical activity. Eating a healthy diet. Avoiding tobacco and drug use. Limiting alcohol use. Practicing safe sex. Taking low doses of aspirin every day. Taking vitamin and mineral supplements as recommended by your health care provider. What happens during an annual well check? The services and screenings done by your health  care provider during your annual well check will depend on your age, overall health, lifestyle risk factors, and family history of disease. Counseling  Your health care provider may ask you questions about your: Alcohol use. Tobacco use. Drug use. Emotional well-being. Home and relationship well-being. Sexual activity. Eating habits. History of falls. Memory and ability to understand (cognition). Work and work Astronomer. Screening  You may have the following tests or measurements: Height, weight, and BMI. Blood pressure. Lipid and cholesterol levels. These may be checked every 5 years, or more frequently if you are over 79 years old. Skin check. Lung cancer screening. You may have this screening every year starting at age 80 if you have a 30-pack-year history of smoking and currently smoke or have quit within the past 15 years. Fecal occult blood test (FOBT) of the stool. You may have this test every year starting at age 14. Flexible sigmoidoscopy or colonoscopy. You may have a sigmoidoscopy every 5 years or a colonoscopy every 10 years starting at age 33. Prostate cancer screening. Recommendations will vary depending on your family history and other risks. Hepatitis C blood test. Hepatitis B blood test. Sexually transmitted disease (STD) testing. Diabetes screening. This is done by checking your blood sugar (glucose) after you have not eaten for a while (fasting). You may have this done every 1-3 years. Abdominal aortic aneurysm (AAA) screening. You may need this if you are a current or former smoker. Osteoporosis. You may be screened starting at age 53  if you are at high risk. Talk with your health care provider about your test results, treatment options, and if necessary, the need for more tests. Vaccines  Your health care provider may recommend certain vaccines, such as: Influenza vaccine. This is recommended every year. Tetanus, diphtheria, and acellular pertussis (Tdap, Td)  vaccine. You may need a Td booster every 10 years. Zoster vaccine. You may need this after age 78. Pneumococcal 13-valent conjugate (PCV13) vaccine. One dose is recommended after age 63. Pneumococcal polysaccharide (PPSV23) vaccine. One dose is recommended after age 72. Talk to your health care provider about which screenings and vaccines you need and how often you need them. This information is not intended to replace advice given to you by your health care provider. Make sure you discuss any questions you have with your health care provider. Document Released: 06/22/2015 Document Revised: 02/13/2016 Document Reviewed: 03/27/2015 Elsevier Interactive Patient Education  2017 [place] Prevention in the Home Falls can cause injuries. They can happen to people of all ages. There are many things you can do to make your home safe and to help prevent falls. What can I do on the outside of my home? Regularly fix the edges of walkways and driveways and fix any cracks. Remove anything that might make you trip as you walk through a door, such as a raised step or threshold. Trim any bushes or trees on the path to your home. Use bright outdoor lighting. Clear any walking paths of anything that might make someone trip, such as rocks or tools. Regularly check to see if handrails are loose or broken. Make sure that both sides of any steps have handrails. Any raised decks and porches should have guardrails on the edges. Have any leaves, snow, or ice cleared regularly. Use sand or salt on walking paths during winter. Clean up any spills in your garage right away. This includes oil or grease spills. What can I do in the bathroom? Use night lights. Install grab bars by the toilet and in the tub and shower. Do not use towel bars as grab bars. Use non-skid mats or decals in the tub or shower. If you need to sit down in the shower, use a plastic, non-slip stool. Keep the floor dry. Clean up any  water that spills on the floor as soon as it happens. Remove soap buildup in the tub or shower regularly. Attach bath mats securely with double-sided non-slip rug tape. Do not have throw rugs and other things on the floor that can make you trip. What can I do in the bedroom? Use night lights. Make sure that you have a light by your bed that is easy to reach. Do not use any sheets or blankets that are too big for your bed. They should not hang down onto the floor. Have a firm chair that has side arms. You can use this for support while you get dressed. Do not have throw rugs and other things on the floor that can make you trip. What can I do in the kitchen? Clean up any spills right away. Avoid walking on wet floors. Keep items that you use a lot in easy-to-reach places. If you need to reach something above you, use a strong step stool that has a grab bar. Keep electrical cords out of the way. Do not use floor polish or wax that makes floors slippery. If you must use wax, use non-skid floor wax. Do not have throw rugs and other things  on the floor that can make you trip. What can I do with my stairs? Do not leave any items on the stairs. Make sure that there are handrails on both sides of the stairs and use them. Fix handrails that are broken or loose. Make sure that handrails are as long as the stairways. Check any carpeting to make sure that it is firmly attached to the stairs. Fix any carpet that is loose or worn. Avoid having throw rugs at the top or bottom of the stairs. If you do have throw rugs, attach them to the floor with carpet tape. Make sure that you have a light switch at the top of the stairs and the bottom of the stairs. If you do not have them, ask someone to add them for you. What else can I do to help prevent falls? Wear shoes that: Do not have high heels. Have rubber bottoms. Are comfortable and fit you well. Are closed at the toe. Do not wear sandals. If you use a  stepladder: Make sure that it is fully opened. Do not climb a closed stepladder. Make sure that both sides of the stepladder are locked into place. Ask someone to hold it for you, if possible. Clearly mark and make sure that you can see: Any grab bars or handrails. First and last steps. Where the edge of each step is. Use tools that help you move around (mobility aids) if they are needed. These include: Canes. Walkers. Scooters. Crutches. Turn on the lights when you go into a dark area. Replace any light bulbs as soon as they burn out. Set up your furniture so you have a clear path. Avoid moving your furniture around. If any of your floors are uneven, fix them. If there are any pets around you, be aware of where they are. Review your medicines with your doctor. Some medicines can make you feel dizzy. This can increase your chance of falling. Ask your doctor what other things that you can do to help prevent falls. This information is not intended to replace advice given to you by your health care provider. Make sure you discuss any questions you have with your health care provider. Document Released: 03/22/2009 Document Revised: 11/01/2015 Document Reviewed: 06/30/2014 Elsevier Interactive Patient Education  2017 [employer].

## 2022-11-24 NOTE — Progress Notes (Cosign Needed Addendum)
I connected with  Nada Boozer on 11/24/22 by a audio enabled telemedicine application and verified that I am speaking with the correct person using two identifiers.  Patient Location: Home  Provider Location: Office/Clinic  I discussed the limitations of evaluation and management by telemedicine. The patient expressed understanding and agreed to proceed.  Patient Medicare AWV questionnaire was completed by the patient on 11/19/2022; I have confirmed that all information answered by patient is correct and no changes since this date.    Subjective:   Richard Burgess is a 89 y.o. male who presents for Medicare Annual/Subsequent preventive examination.  Review of Systems     Cardiac Risk Factors include: advanced age (>74men, >14 women);diabetes mellitus;dyslipidemia;hypertension;male gender     Objective:    Today's Vitals   11/24/22 0851 11/24/22 0852  Weight: 199 lb (90.3 kg)   Height: 5\' 11"  (1.803 m)   PainSc: 2  2   PainLoc: Groin    Body mass index is 27.75 kg/m.     11/24/2022    8:57 AM 11/18/2021   10:06 AM 10/16/2020   12:58 PM 04/25/2020    6:47 AM 04/20/2020   10:24 AM 07/22/2018    9:50 AM 04/10/2017    6:00 PM  Advanced Directives  Does Patient Have a Medical Advance Directive? Yes Yes Yes No No No Yes  Type of Estate agent of [place] at [place];Living will Healthcare Power of [place];Living will Living will;Healthcare Power of Attorney    Living will  Does patient want to make changes to medical advance directive? No - Patient declined  No - Patient declined    No - Patient declined  Copy of Healthcare Power of Attorney in Chart? Yes - validated most recent copy scanned in chart (See row information) No - copy requested No - copy requested      Would patient like information on creating a medical advance directive?    No - Patient declined No - Patient declined No - Patient declined     Current Medications (verified) Outpatient Encounter  Medications as of 11/24/2022  Medication Sig   albuterol (VENTOLIN HFA) 108 (90 Base) MCG/ACT inhaler INHALE 2 PUFFS BY MOUTH FOUR TIMES A DAY AS NEEDED FOR SHORTNESS OF BREATH   aspirin EC 81 MG tablet Take 81 mg by mouth daily.   busPIRone (BUSPAR) 10 MG tablet    busPIRone (BUSPAR) 10 MG tablet TAKE ONE-HALF TABLET BY MOUTH DAILY AS NEEDED FOR ANXIETY   Cholecalciferol 25 MCG (1000 UT) tablet Take by mouth.   Cinnamon 500 MG capsule Take 2,000 mg by mouth daily.   clotrimazole (LOTRIMIN) 1 % cream APPLY SMALL AMOUNT TO AFFECTED AREA TWICE A DAY   clotrimazole-betamethasone (LOTRISONE) cream Apply 1 Application topically daily.   cyanocobalamin 500 MCG tablet Take 500 mcg by mouth daily. Vitamin B12   docusate calcium (SURFAK) 240 MG capsule Take 240 mg by mouth at bedtime.   doxycycline (VIBRA-TABS) 100 MG tablet Take 1 tablet (100 mg total) by mouth 2 (two) times daily.   Evolocumab (REPATHA SURECLICK) 140 MG/ML SOAJ INJECT 140 MG INTO THE SKIN EVERY 14 (FOURTEEN) DAYS. PT. IS OVERDUE FOR FOLLOWUP.   ibuprofen (ADVIL) 600 MG tablet Take by mouth.   isosorbide mononitrate (IMDUR) 60 MG 24 hr tablet Take 1 tablet (60 mg total) by mouth daily.   Lifitegrast 5 % SOLN INSTILL 1 DROP IN EACH EYE TWICE A DAY   losartan (COZAAR) 25 MG tablet TAKE ONE TABLET BY MOUTH  EVERY MORNING FOR HEART   melatonin 3 MG TABS tablet Take 3 mg by mouth at bedtime.   Multiple Vitamins-Minerals (PRESERVISION AREDS 2) CAPS Take 1 capsule by mouth in the morning and at bedtime.   nitroGLYCERIN (NITROSTAT) 0.4 MG SL tablet Place 1 tablet (0.4 mg total) under the tongue every 5 (five) minutes as needed for chest pain.   pantoprazole (PROTONIX) 40 MG tablet TAKE ONE TABLET BY MOUTH TWICE A DAY (TAKE ON AN EMPTY STOMACH 30 MINUTES PRIOR TO A MEAL)   Polyethyl Glycol-Propyl Glycol (SYSTANE OP) Place 1 drop into both eyes 4 (four) times daily as needed (dry eyes).   Propylene Glycol 0.6 % SOLN INSTILL 1 DROP IN EACH EYE  FOUR TIMES A DAY   traMADol (ULTRAM) 50 MG tablet 1 tablet (50 mg total) every 6 (six) hours as needed.   tretinoin (RETIN-A) 0.1 % cream APPLY THIN LAYER TO AFFECTED AREA NIGHTLY AS DIRECTED. APPLY A SMALL AMOUNT TO THE BUMPS ON THE ARMS AND CHEST NIGHTLY   triamcinolone ointment (KENALOG) 0.1 % Apply 1 application topically 2 (two) times daily.   valACYclovir (VALTREX) 500 MG tablet Take 500 mg by mouth daily.   No facility-administered encounter medications on file as of 11/24/2022.    Allergies (verified) Bee venom, Amlodipine, Livalo [pitavastatin], Oxycodone, Pravastatin sodium, Zetia [ezetimibe], Rosuvastatin, and Statins   History: Past Medical History:  Diagnosis Date   Allergy    BPH (benign prostatic hypertrophy)    Carotid artery occlusion    Diverticulosis of colon    ED (erectile dysfunction)    GERD (gastroesophageal reflux disease)    Glucose intolerance (impaired glucose tolerance)    Hearing loss    no hearing aids   History of pancreatitis    HSV infection    left eye   Hx-TIA (transient ischemic attack)    Hyperlipidemia    Hypertension    Hypertention, malignant, with acute intensive management    Low back pain    Osteoarthritis    PVD (peripheral vascular disease) (HCC)    Bilateral carotid   Tubular adenoma of colon 08/1991   One with carcinoma IN SITU 1993, multiple adenomatous   Wears partial dentures    upper   Past Surgical History:  Procedure Laterality Date   APPENDECTOMY     BASAL CELL CARCINOMA EXCISION     check and ear lobe   BLEPHAROPLASTY Left    x 3   CHOLECYSTECTOMY     COLONOSCOPY     ESOPHAGOGASTRODUODENOSCOPY     LEFT HEART CATH AND CORONARY ANGIOGRAPHY N/A 01/26/2017   Procedure: LEFT HEART CATH AND CORONARY ANGIOGRAPHY;  Surgeon: Runell Gess, MD;  Location: [facility];  Service: Cardiovascular;  Laterality: N/A;   LUMBAR SPINE SURGERY     X4   TRANSCAROTID ARTERY REVASCULARIZATION  Right 04/25/2020    Procedure: RIGHT TRANSCAROTID ARTERY REVASCULARIZATION;  Surgeon: Nada Libman, MD;  Location: [facility] OR;  Service: Vascular;  Laterality: Right;   ULTRASOUND GUIDANCE FOR VASCULAR ACCESS Right 04/25/2020   Procedure: ULTRASOUND GUIDANCE FOR VASCULAR ACCESS;  Surgeon: Nada Libman, MD;  Location: [facility] OR;  Service: Vascular;  Laterality: Right;   Family History  Problem Relation Age of Onset   Dementia Father    Diabetes Mother    Peripheral vascular disease Brother    Colon cancer Sister        dx in her 70's   Social History   Socioeconomic History   Marital  status: Widowed    Spouse name: Not on file   Number of children: 3   Years of education: Not on file   Highest education level: Not on file  Occupational History   Occupation: Retired-Construction  Tobacco Use   Smoking status: Former    Packs/day: 0.50    Years: 20.00    Additional pack years: 0.00    Total pack years: 10.00    Types: Cigarettes    Start date: 22    Quit date: 1970    Years since quitting: 54.4    Passive exposure: Never   Smokeless tobacco: Never   Tobacco comments:    Daily caffeine use, regular exercise  Vaping Use   Vaping Use: Never used  Substance and Sexual Activity   Alcohol use: Yes    Alcohol/week: 1.0 - 2.0 standard drink of alcohol    Types: 1 - 2 Cans of beer per week   Drug use: No   Sexual activity: Yes  Other Topics Concern   Not on file  Social History Narrative   Not on file   Social Determinants of Health   Financial Resource Strain: Low Risk  (11/24/2022)   Overall Financial Resource Strain (CARDIA)    Difficulty of Paying Living Expenses: Not very hard  Food Insecurity: No Food Insecurity (11/24/2022)   Hunger Vital Sign    Worried About Running Out of Food in the Last Year: Never true    Ran Out of Food in the Last Year: Never true  Transportation Needs: No Transportation Needs (11/24/2022)   PRAPARE - Administrator, Civil Service (Medical): No     Lack of Transportation (Non-Medical): No  Physical Activity: Sufficiently Active (11/24/2022)   Exercise Vital Sign    Days of Exercise per Week: 6 days    Minutes of Exercise per Session: 150+ min  Stress: No Stress Concern Present (11/24/2022)   [employer] of Occupational Health - Occupational Stress Questionnaire    Feeling of Stress : Only a little  Social Connections: Unknown (11/24/2022)   Social Connection and Isolation Panel [NHANES]    Frequency of Communication with Friends and Family: More than three times a week    Frequency of Social Gatherings with Friends and Family: More than three times a week    Attends Religious Services: Not on file    Active Member of Clubs or Organizations: Yes    Attends Banker Meetings: More than 4 times per year    Marital Status: Widowed    Tobacco Counseling Counseling given: Not Answered Tobacco comments: Daily caffeine use, regular exercise   Clinical Intake:  Pre-visit preparation completed: Yes  Pain : 0-10 Pain Score: 2  Pain Type: Acute pain Pain Location: Groin     BMI - recorded: 27.75 Nutritional Status: BMI 25 -29 Overweight Nutritional Risks: None Diabetes: No  How often do you need to have someone help you when you read instructions, pamphlets, or other written materials from your doctor or pharmacy?: 1 - Never What is the last grade level you completed in school?: HSG  Diabetic? No medication for diabetes  Interpreter Needed?: No  Information entered by :: Susie Cassette, LPN.   Activities of Daily Living    11/24/2022    8:59 AM 11/19/2022   10:40 AM  In your present state of health, do you have any difficulty performing the following activities:  Hearing? 1 1  Vision? 0 0  Difficulty concentrating or making decisions?  0 0  Walking or climbing stairs? 1 1  Dressing or bathing? 0 0  Doing errands, shopping? 0 0  Preparing Food and eating ? N N  Using the Toilet? N N  In the past  six months, have you accidently leaked urine? N N  Do you have problems with loss of bowel control? N N  Managing your Medications? N N  Managing your Finances? N N  Housekeeping or managing your Housekeeping? N N    Patient Care Team: Plotnikov, Georgina Quint, MD as PCP - General Chilton Si, MD as PCP - Cardiology (Cardiology) Chilton Si, MD as Attending Physician (Cardiology) Szabat, Vinnie Level, [facility] (Inactive) as Pharmacist (Pharmacist)  Indicate any recent Medical Services you may have received from other than [facility] providers in the past year (date may be approximate).     Assessment:   This is a routine wellness examination for Richard Burgess.  Hearing/Vision screen Hearing Screening - Comments:: Patient has issues with hearing. Vision Screening - Comments:: Wears rx glasses - up to date with routine [facility] exams [facility]   Dietary issues and exercise activities discussed: Current Exercise Habits: Home exercise routine, Time (Minutes): > 60 (+150), Frequency (Times/Week): 6, Weekly Exercise (Minutes/Week): 0, Intensity: Moderate   Goals Addressed             This Visit's Progress    Client understands the importance of follow-up with providers by attending scheduled visits        Depression Screen    11/24/2022    8:54 AM 11/06/2022    8:29 AM 11/18/2021   10:07 AM 11/18/2021   10:05 AM 07/16/2021   10:06 AM 10/16/2020   12:57 PM 01/27/2018    8:23 AM  PHQ 2/9 Scores  PHQ - 2 Score 0 0 0 0 0 0 0  PHQ- 9 Score 0          Fall Risk    11/24/2022    8:59 AM 11/19/2022   10:40 AM 11/06/2022    8:28 AM 11/18/2021   10:07 AM 07/16/2021   10:06 AM  Fall Risk   Falls in the past year? 0 0 0 0 0  Number falls in past yr: 0 0 0 0 0  Injury with Fall? 0 0 0 0 0  Risk for fall due to : No Fall Risks  No Fall Risks    Follow up Falls prevention discussed  Falls evaluation completed Falls evaluation completed;Education provided     FALL RISK PREVENTION PERTAINING TO  THE HOME:  Any stairs in or around the home? No  If so, are there any without handrails? No  Home free of loose throw rugs in walkways, pet beds, electrical cords, etc? Yes  Adequate lighting in your home to reduce risk of falls? Yes   ASSISTIVE DEVICES UTILIZED TO PREVENT FALLS:  Life alert? No  Use of a cane, walker or w/c? Yes  Grab bars in the bathroom? Yes  Shower chair or bench in shower? Yes  Elevated toilet seat or a handicapped toilet? Yes   TIMED UP AND GO:  Was the test performed? No . Telephonic Visit   Cognitive Function:    10/30/2014    9:12 AM  MMSE - Mini Mental State Exam  Not completed: Unable to complete        11/24/2022    9:00 AM  6CIT Screen  What Year? 0 points  What month? 0 points  What time? 0 points  Count back from 20 0 points  Months in reverse 0 points  Repeat phrase 0 points  Total Score 0 points    Immunizations Immunization History  Administered Date(s) Administered   Fluad Quad(high Dose 65+) 03/27/2020, 04/22/2021   Influenza Split 02/08/2015, 01/08/2016   Influenza, High Dose Seasonal PF 02/18/2016, 03/09/2016, 04/16/2017, 03/03/2018, 03/30/2019, 03/09/2021   Influenza-Unspecified 07/11/2015, 03/10/2018, 03/10/2022   Moderna Covid-19 Vaccine Bivalent Booster 106yrs & up 04/22/2021   Moderna Sars-Covid-2 Vaccination 07/19/2019, 08/16/2019   Pneumococcal Conjugate-13 08/01/2013   Pneumococcal Polysaccharide-23 07/20/2015   Rsv, Bivalent, Protein Subunit Rsvpref,pf Verdis Frederickson) 09/18/2022   Td 02/23/2008   Td (Adult),5 Lf Tetanus Toxid, Preservative Free 12/20/2020   Td,absorbed, Preservative Free, Adult Use, Lf Unspecified 12/20/2020   Tdap 11/08/2010   Tetanus Immune Globulin 02/23/2008   Zoster Recombinat (Shingrix) 10/05/2020, 12/20/2020    TDAP status: Up to date  Flu Vaccine status: Up to date  Pneumococcal vaccine status: Up to date  Covid-19 vaccine status: Completed vaccines  Qualifies for Shingles Vaccine? Yes    Zostavax completed No   Shingrix Completed?: Yes  Screening Tests Health Maintenance  Topic Date Due   FOOT EXAM  Never done   COVID-19 Vaccine (4 - 2023-24 season) 02/07/2022   HEMOGLOBIN A1C  04/18/2022   INFLUENZA VACCINE  01/08/2023   OPHTHALMOLOGY EXAM  08/03/2023   Medicare Annual Wellness (AWV)  11/24/2023   DTaP/Tdap/Td (5 - Td or Tdap) 12/21/2030   Pneumonia Vaccine 44+ Years old  Completed   Zoster Vaccines- Shingrix  Completed   HPV VACCINES  Aged Out    Health Maintenance  Health Maintenance Due  Topic Date Due   FOOT EXAM  Never done   COVID-19 Vaccine (4 - 2023-24 season) 02/07/2022   HEMOGLOBIN A1C  04/18/2022    Colorectal cancer screening: No longer required.   Lung Cancer Screening: (Low Dose CT Chest recommended if Age 19-80 years, 30 pack-year currently smoking OR have quit w/in 15years.) does not qualify.   Lung Cancer Screening Referral: no  Additional Screening:  Hepatitis C Screening: does not qualify; Completed: no  Vision Screening: Recommended annual ophthalmology exams for early detection of glaucoma and other disorders of the eye. Is the patient up to date with their annual eye exam?  Yes  Who is the provider or what is the name of the office in which the patient attends annual eye exams? [facility] If pt is not established with a provider, would they like to be referred to a provider to establish care? No .   Dental Screening: Recommended annual dental exams for proper oral hygiene  Community Resource Referral / Chronic Care Management: CRR required this visit?  No   CCM required this visit?  No      Plan:     I have personally reviewed and noted the following in the patient's chart:   Medical and social history Use of alcohol, tobacco or illicit drugs  Current medications and supplements including opioid prescriptions. Patient is not currently taking opioid prescriptions. Functional ability and status Nutritional  status Physical activity Advanced directives List of other physicians Hospitalizations, surgeries, and ER visits in previous 12 months Vitals Screenings to include cognitive, depression, and falls Referrals and appointments  In addition, I have reviewed and discussed with patient certain preventive protocols, quality metrics, and best practice recommendations. A written personalized care plan for preventive services as well as general preventive health recommendations were provided to patient.     Mickeal Needy,  LPN   [id number]   Nurse Notes: Normal cognitive status assessed by direct observation via telephone conversation by this Nurse Health Advisor. No abnormalities found.   Medical screening examination/treatment/procedure(s) were performed by non-physician practitioner and as supervising physician I was immediately available for consultation/collaboration.  I agree with above. Jacinta Shoe, MD

## 2022-12-15 ENCOUNTER — Encounter: Payer: Self-pay | Admitting: Internal Medicine

## 2022-12-29 ENCOUNTER — Other Ambulatory Visit: Payer: Self-pay | Admitting: *Deleted

## 2022-12-29 DIAGNOSIS — I6523 Occlusion and stenosis of bilateral carotid arteries: Secondary | ICD-10-CM

## 2023-01-19 ENCOUNTER — Ambulatory Visit (HOSPITAL_COMMUNITY)
Admission: RE | Admit: 2023-01-19 | Discharge: 2023-01-19 | Disposition: A | Discharge status: Home / Self Care | Payer: PPO | Source: Ambulatory Visit | Attending: Surgery | Admitting: Surgery

## 2023-01-19 ENCOUNTER — Ambulatory Visit: Payer: PPO | Admitting: Physician Assistant

## 2023-01-19 VITALS — BP 180/94 | HR 66 | Temp 97.8°F | Resp 18 | Ht 69.0 in | Wt 195.0 lb

## 2023-01-19 DIAGNOSIS — I6523 Occlusion and stenosis of bilateral carotid arteries: Secondary | ICD-10-CM | POA: Insufficient documentation

## 2023-01-19 NOTE — Progress Notes (Signed)
Office Note     CC:  follow up Requesting Provider:  Plotnikov, Georgina Quint, MD  HPI: Richard Burgess is a 89 y.o. ([birth year]) male who presents with his daughter for routine follow up of carotid artery stenosis. Pt is s/p right TCAR for asymptomatic carotid artery stenosis on 04/25/2020 by Dr. Myra Gianotti. He has no history of TIA or stroke. Today he says overall he is doing very well. He denies any amaurosis or visual changes, slurred speech, facial drooping, unilateral upper or lower extremity weakness or numbness. His daughter explains that he had a " spell" back in May when they were playing cards where he just stared blankly and couldn't remember how to play. She said it was very unlike him because they play Rumi or Spades daily. He was seen at hospital and no findings on evaluation. He has had no recurrence. He has no pain on ambulation or rest. No tissue loss. He remains very active.   The pt is not on a statin for cholesterol management.  The pt is on a daily aspirin.   Other AC:  Plavix The pt is on ARB for hypertension.   The pt does not have diabetes Tobacco hx:  former  Past Medical History:  Diagnosis Date   Allergy    BPH (benign prostatic hypertrophy)    Carotid artery occlusion    Diverticulosis of colon    ED (erectile dysfunction)    GERD (gastroesophageal reflux disease)    Glucose intolerance (impaired glucose tolerance)    Hearing loss    no hearing aids   History of pancreatitis    HSV infection    left eye   Hx-TIA (transient ischemic attack)    Hyperlipidemia    Hypertension    Hypertention, malignant, with acute intensive management    Low back pain    Osteoarthritis    PVD (peripheral vascular disease) (HCC)    Bilateral carotid   Tubular adenoma of colon 08/1991   One with carcinoma IN [facility] 1993, multiple adenomatous   Wears partial dentures    upper    Past Surgical History:  Procedure Laterality Date   APPENDECTOMY     BASAL CELL CARCINOMA  EXCISION     check and ear lobe   BLEPHAROPLASTY Left    x 3   CHOLECYSTECTOMY     COLONOSCOPY     ESOPHAGOGASTRODUODENOSCOPY     LEFT HEART CATH AND CORONARY ANGIOGRAPHY N/A 01/26/2017   Procedure: LEFT HEART CATH AND CORONARY ANGIOGRAPHY;  Surgeon: Runell Gess, MD;  Location: [facility];  Service: Cardiovascular;  Laterality: N/A;   LUMBAR SPINE SURGERY     X4   TRANSCAROTID ARTERY REVASCULARIZATION  Right 04/25/2020   Procedure: RIGHT TRANSCAROTID ARTERY REVASCULARIZATION;  Surgeon: Nada Libman, MD;  Location: [facility] OR;  Service: Vascular;  Laterality: Right;   ULTRASOUND GUIDANCE FOR VASCULAR ACCESS Right 04/25/2020   Procedure: ULTRASOUND GUIDANCE FOR VASCULAR ACCESS;  Surgeon: Nada Libman, MD;  Location: [facility] OR;  Service: Vascular;  Laterality: Right;    Social History   Socioeconomic History   Marital status: Widowed    Spouse name: Not on file   Number of children: 3   Years of education: Not on file   Highest education level: Not on file  Occupational History   Occupation: Retired-Construction  Tobacco Use   Smoking status: Former    Current packs/day: 0.00    Average packs/day: 0.5 packs/day for 20.0 years (10.0 ttl pk-yrs)  Types: Cigarettes    Start date: 89    Quit date: 88    Years since quitting: 54.6    Passive exposure: Never   Smokeless tobacco: Never   Tobacco comments:    Daily caffeine use, regular exercise  Vaping Use   Vaping status: Never Used  Substance and Sexual Activity   Alcohol use: Yes    Alcohol/week: 1.0 - 2.0 standard drink of alcohol    Types: 1 - 2 Cans of beer per week   Drug use: No   Sexual activity: Yes  Other Topics Concern   Not on file  Social History Narrative   Not on file   Social Determinants of Health   Financial Resource Strain: Low Risk  (11/24/2022)   Overall Financial Resource Strain (CARDIA)    Difficulty of Paying Living Expenses: Not very hard  Food Insecurity: No Food Insecurity  (11/24/2022)   Hunger Vital Sign    Worried About Running Out of Food in the Last Year: Never true    Ran Out of Food in the Last Year: Never true  Transportation Needs: No Transportation Needs (11/24/2022)   PRAPARE - Administrator, Civil Service (Medical): No    Lack of Transportation (Non-Medical): No  Physical Activity: Sufficiently Active (11/24/2022)   Exercise Vital Sign    Days of Exercise per Week: 6 days    Minutes of Exercise per Session: 150+ min  Stress: No Stress Concern Present (11/24/2022)   [employer] of Occupational Health - Occupational Stress Questionnaire    Feeling of Stress : Only a little  Social Connections: Unknown (11/24/2022)   Social Connection and Isolation Panel [NHANES]    Frequency of Communication with Friends and Family: More than three times a week    Frequency of Social Gatherings with Friends and Family: More than three times a week    Attends Religious Services: Not on file    Active Member of Clubs or Organizations: Yes    Attends Banker Meetings: More than 4 times per year    Marital Status: Widowed  Intimate Partner Violence: Not At Risk (11/24/2022)   Humiliation, Afraid, Rape, and Kick questionnaire    Fear of Current or Ex-Partner: No    Emotionally Abused: No    Physically Abused: No    Sexually Abused: No    Family History  Problem Relation Age of Onset   Dementia Father    Diabetes Mother    Peripheral vascular disease Brother    Colon cancer Sister        dx in her 4's    Current Outpatient Medications  Medication Sig Dispense Refill   albuterol (VENTOLIN HFA) 108 (90 Base) MCG/ACT inhaler INHALE 2 PUFFS BY MOUTH FOUR TIMES A DAY AS NEEDED FOR SHORTNESS OF BREATH     aspirin EC 81 MG tablet Take 81 mg by mouth daily.     busPIRone (BUSPAR) 10 MG tablet      busPIRone (BUSPAR) 10 MG tablet TAKE ONE-HALF TABLET BY MOUTH DAILY AS NEEDED FOR ANXIETY     Cholecalciferol 25 MCG (1000 UT) tablet Take  by mouth.     Cinnamon 500 MG capsule Take 2,000 mg by mouth daily.     clotrimazole (LOTRIMIN) 1 % cream APPLY SMALL AMOUNT TO AFFECTED AREA TWICE A DAY     clotrimazole-betamethasone (LOTRISONE) cream Apply 1 Application topically daily. 30 g 0   cyanocobalamin 500 MCG tablet Take 500 mcg by mouth daily.  Vitamin B12     docusate calcium (SURFAK) 240 MG capsule Take 240 mg by mouth at bedtime.     doxycycline (VIBRA-TABS) 100 MG tablet Take 1 tablet (100 mg total) by mouth 2 (two) times daily. 20 tablet 0   Evolocumab (REPATHA SURECLICK) 140 MG/ML SOAJ INJECT 140 MG INTO THE SKIN EVERY 14 (FOURTEEN) DAYS. PT. IS OVERDUE FOR FOLLOWUP. 6 mL 3   ibuprofen (ADVIL) 600 MG tablet Take by mouth.     isosorbide mononitrate (IMDUR) 60 MG 24 hr tablet Take 1 tablet (60 mg total) by mouth daily. 90 tablet 3   Lifitegrast 5 % SOLN INSTILL 1 DROP IN EACH EYE TWICE A DAY     losartan (COZAAR) 25 MG tablet TAKE ONE TABLET BY MOUTH EVERY MORNING FOR HEART     melatonin 3 MG TABS tablet Take 3 mg by mouth at bedtime.     Multiple Vitamins-Minerals (PRESERVISION AREDS 2) CAPS Take 1 capsule by mouth in the morning and at bedtime.     nitroGLYCERIN (NITROSTAT) 0.4 MG SL tablet Place 1 tablet (0.4 mg total) under the tongue every 5 (five) minutes as needed for chest pain. 20 tablet 1   pantoprazole (PROTONIX) 40 MG tablet TAKE ONE TABLET BY MOUTH TWICE A DAY (TAKE ON AN EMPTY STOMACH 30 MINUTES PRIOR TO A MEAL)     Polyethyl Glycol-Propyl Glycol (SYSTANE OP) Place 1 drop into both eyes 4 (four) times daily as needed (dry eyes).     Propylene Glycol 0.6 % SOLN INSTILL 1 DROP IN EACH EYE FOUR TIMES A DAY     traMADol (ULTRAM) 50 MG tablet 1 tablet (50 mg total) every 6 (six) hours as needed.     tretinoin (RETIN-A) 0.1 % cream APPLY THIN LAYER TO AFFECTED AREA NIGHTLY AS DIRECTED. APPLY A SMALL AMOUNT TO THE BUMPS ON THE ARMS AND CHEST NIGHTLY     triamcinolone ointment (KENALOG) 0.1 % Apply 1 application topically 2  (two) times daily. 450 g 3   valACYclovir (VALTREX) 500 MG tablet Take 500 mg by mouth daily.     No current facility-administered medications for this visit.    Allergies  Allergen Reactions   Bee Venom Swelling   Amlodipine Other (See Comments)    Weakness in legs   Livalo [Pitavastatin]     Pain, SOB   Oxycodone Other (See Comments)    crazy   Pravastatin Sodium Other (See Comments)    REACTION: bad dreams   Zetia [Ezetimibe]     LEG PAIN    Rosuvastatin Palpitations   Statins Palpitations and Other (See Comments)    REACTION: aches, bad dreams     REVIEW OF SYSTEMS:  [X]  denotes positive finding, [ ]  denotes negative finding Cardiac  Comments:  Chest pain or chest pressure:    Shortness of breath upon exertion:    Short of breath when lying flat:    Irregular heart rhythm:        Vascular    Pain in calf, thigh, or hip brought on by ambulation:    Pain in feet at night that wakes you up from your sleep:     Blood clot in your veins:    Leg swelling:         Pulmonary    Oxygen at home:    Productive cough:     Wheezing:         Neurologic    Sudden weakness in arms or legs:  Sudden numbness in arms or legs:     Sudden onset of difficulty speaking or slurred speech:    Temporary loss of vision in one eye:     Problems with dizziness:         Gastrointestinal    Blood in stool:     Vomited blood:         Genitourinary    Burning when urinating:     Blood in urine:        Psychiatric    Major depression:         Hematologic    Bleeding problems:    Problems with blood clotting too easily:        Skin    Rashes or ulcers:        Constitutional    Fever or chills:      PHYSICAL EXAMINATION:  Vitals:   01/19/23 1453  BP: (!) 180/94  Pulse: 66  Resp: 18  Temp: 97.8 F (36.6 C)  TempSrc: Temporal  SpO2: 97%  Weight: 195 lb (88.5 kg)  Height: 5\' 9"  (1.753 m)    General:  WDWN in NAD; vital signs documented above Gait:  Normal HENT: WNL, normocephalic Pulmonary: normal non-labored breathing Cardiac: regular HR Abdomen: soft, NT, no masses Vascular Exam/Pulses: 2+ radial, 2+ femoral, 2+ AT and PT pulses bilaterally Extremities: without ischemic changes, without Gangrene , without cellulitis; without open wounds;  Musculoskeletal: no muscle wasting or atrophy  Neurologic: A&O X 3 Psychiatric:  The pt has Normal affect.   Non-Invasive Vascular Imaging:   Summary:  Right Carotid: Widely patent stent with no evidence of stenosis in the right ICA.   Left Carotid: Velocities in the left ICA are consistent with a 1-39% stenosis.   Vertebrals: Bilateral vertebral arteries demonstrate antegrade flow.  Subclavians: Normal flow hemodynamics were seen in bilateral subclavian arteries.    ASSESSMENT/PLAN:: 89 y.o. male here for follow up of carotid artery stenosis. He is s/p right TCAR for asymptomatic carotid artery stenosis on 04/25/2020 by Dr. Myra Gianotti. He has no history of TIA or stroke. He is without any associated symptoms today. His duplex today shows 1-39% ICA stenosis bilaterally. He has normal flow in the vertebral and subclavian arteries bilaterally - Continue Aspirin - He will follow up in 1 year with repeat Carotid duplex  Graceann Congress, PA-C [facility] [phone]  Clinic MD: Myra Gianotti

## 2023-01-23 ENCOUNTER — Other Ambulatory Visit: Payer: Self-pay

## 2023-01-23 DIAGNOSIS — I6523 Occlusion and stenosis of bilateral carotid arteries: Secondary | ICD-10-CM

## 2023-06-09 ENCOUNTER — Other Ambulatory Visit: Payer: Self-pay | Admitting: Internal Medicine

## 2023-06-19 ENCOUNTER — Other Ambulatory Visit (HOSPITAL_BASED_OUTPATIENT_CLINIC_OR_DEPARTMENT_OTHER): Payer: Self-pay | Admitting: Cardiovascular Disease

## 2023-06-23 ENCOUNTER — Other Ambulatory Visit (HOSPITAL_BASED_OUTPATIENT_CLINIC_OR_DEPARTMENT_OTHER): Payer: Self-pay | Admitting: Cardiovascular Disease

## 2023-06-25 ENCOUNTER — Other Ambulatory Visit: Payer: Self-pay

## 2023-07-02 ENCOUNTER — Telehealth (HOSPITAL_BASED_OUTPATIENT_CLINIC_OR_DEPARTMENT_OTHER): Payer: Self-pay | Admitting: Cardiovascular Disease

## 2023-07-02 NOTE — Telephone Encounter (Signed)
Pt c/o medication issue:  1. Name of Medication:   Evolocumab (REPATHA SURECLICK) 140 MG/ML SOAJ   2. How are you currently taking this medication (dosage and times per day)?  As prescribed  3. Are you having a reaction (difficulty breathing--STAT)?   4. What is your medication issue?    Daughter Olegario Messier) stated patient wants to re-apply for assistance to get this medication and want a call back directly from [place].

## 2023-07-03 NOTE — Telephone Encounter (Signed)
Re-Enrolled in  [facility] grant for Repatha   Card No. [id number]  BIN [id number]  PCN [id number]  PC Group [id number]  Spoke to patient's daughter.

## 2023-07-06 ENCOUNTER — Telehealth: Payer: Self-pay

## 2023-07-06 ENCOUNTER — Other Ambulatory Visit (HOSPITAL_COMMUNITY): Payer: Self-pay

## 2023-07-06 DIAGNOSIS — I2510 Atherosclerotic heart disease of native coronary artery without angina pectoris: Secondary | ICD-10-CM

## 2023-07-06 DIAGNOSIS — I6523 Occlusion and stenosis of bilateral carotid arteries: Secondary | ICD-10-CM

## 2023-07-06 NOTE — Telephone Encounter (Signed)
Pharmacy Patient Advocate Encounter   Received notification from CoverMyMeds that prior authorization for REPATHA is required/requested.   Insurance verification completed.   The patient is insured through [facility] ADVANTAGE/RX ADVANCE .   Per test claim: PA required; However, NEW/RECENT labs/notes are needed to complete & submit PA request. Please see below.  PLEASE ADVISE

## 2023-07-06 NOTE — Telephone Encounter (Signed)
Spoke to daughter, will be going for fasting lipid lab tomorrow.

## 2023-07-06 NOTE — Telephone Encounter (Signed)
LVM to call back.

## 2023-07-07 DIAGNOSIS — I2510 Atherosclerotic heart disease of native coronary artery without angina pectoris: Secondary | ICD-10-CM | POA: Diagnosis not present

## 2023-07-08 ENCOUNTER — Encounter (HOSPITAL_BASED_OUTPATIENT_CLINIC_OR_DEPARTMENT_OTHER): Payer: Self-pay | Admitting: Cardiovascular Disease

## 2023-07-08 LAB — LIPID PANEL
Chol/HDL Ratio: 2.6 {ratio} (ref 0.0–5.0)
Cholesterol, Total: 104 mg/dL (ref 100–199)
HDL: 40 mg/dL (ref >39)
LDL Chol Calc (NIH): 44 mg/dL (ref 0–99)
Triglycerides: 104 mg/dL (ref 0–149)
VLDL Cholesterol Cal: 20 mg/dL (ref 5–40)

## 2023-07-09 ENCOUNTER — Other Ambulatory Visit (HOSPITAL_COMMUNITY): Payer: Self-pay

## 2023-07-09 NOTE — Telephone Encounter (Signed)
Thanks, labs have been submitted

## 2023-07-09 NOTE — Telephone Encounter (Signed)
Pharmacy Patient Advocate Encounter  Received notification from [facility] ADVANTAGE/RX ADVANCE that Prior Authorization for REPATHA has been APPROVED from 07/09/23 to 07/08/24. Ran test claim, Copay is $47. This test claim was processed through [facility]- copay amounts may vary at other pharmacies due to pharmacy/plan contracts, or as the patient moves through the different stages of their insurance plan.

## 2023-07-10 NOTE — Telephone Encounter (Signed)
Pt daughter (per DPR) made aware

## 2023-11-26 ENCOUNTER — Ambulatory Visit: Payer: PPO

## 2023-12-22 ENCOUNTER — Ambulatory Visit

## 2024-01-18 ENCOUNTER — Ambulatory Visit

## 2024-02-15 ENCOUNTER — Ambulatory Visit

## 2024-02-15 ENCOUNTER — Encounter (HOSPITAL_COMMUNITY)

## 2024-02-18 ENCOUNTER — Ambulatory Visit

## 2024-02-18 VITALS — Ht 71.0 in | Wt 203.0 lb

## 2024-02-18 DIAGNOSIS — Z0000 Encounter for general adult medical examination without abnormal findings: Secondary | ICD-10-CM

## 2024-02-18 NOTE — Patient Instructions (Addendum)
 Mr. Richard Burgess,  Thank you for taking the time for your Medicare Wellness Visit. I appreciate your continued commitment to your health goals. Please review the care plan we discussed, and feel free to reach out if I can assist you further.  Medicare recommends these wellness visits once per year to help you and your care team stay ahead of potential health issues. These visits are designed to focus on prevention, allowing your provider to concentrate on managing your acute and chronic conditions during your regular appointments.  Please note that Annual Wellness Visits do not include a physical exam. Some assessments may be limited, especially if the visit was conducted virtually. If needed, we may recommend a separate in-person follow-up with your provider.  Ongoing Care Seeing your primary care provider every 3 to 6 months helps us  monitor your health and provide consistent, personalized care.   Referrals If a referral was made during today's visit and you haven't received any updates within two weeks, please contact the referred provider directly to check on the status.  Recommended Screenings:  Health Maintenance  Topic Date Due   Complete foot exam   Never done   Hemoglobin A1C  04/18/2022   Flu Shot  01/08/2024   COVID-19 Vaccine (4 - 2025-26 season) 02/08/2024   Eye exam for diabetics  01/17/2025   Medicare Annual Wellness Visit  02/17/2025   DTaP/Tdap/Td vaccine (5 - Td or Tdap) 12/21/2030   Pneumococcal Vaccine for age over 97  Completed   Zoster (Shingles) Vaccine  Completed   HPV Vaccine  Aged Out   Meningitis B Vaccine  Aged Out       02/18/2024   10:14 AM  Advanced Directives  Does Patient Have a Medical Advance Directive? Yes  Type of Estate agent of [place];Living will  Does patient want to make changes to medical advance directive? No - Patient declined  Copy of Healthcare Power of Attorney in Chart? Yes - validated most recent copy scanned in  chart (See row information)   Advance Care Planning is important because it: Ensures you receive medical care that aligns with your values, goals, and preferences. Provides guidance to your family and loved ones, reducing the emotional burden of decision-making during critical moments.  Vision: Annual vision screenings are recommended for early detection of glaucoma, cataracts, and diabetic retinopathy. These exams can also reveal signs of chronic conditions such as diabetes and high blood pressure.  Dental: Annual dental screenings help detect early signs of oral cancer, gum disease, and other conditions linked to overall health, including heart disease and diabetes.

## 2024-02-18 NOTE — Progress Notes (Addendum)
 Subjective:   Richard Burgess is a 89 y.o. who presents for a Medicare Wellness preventive visit.  As a reminder, Annual Wellness Visits don't include a physical exam, and some assessments may be limited, especially if this visit is performed virtually. We may recommend an in-person follow-up visit with your provider if needed.  Visit Complete: Virtual I connected with  Nancyann LELON Kleine on 02/18/24 by a audio enabled telemedicine application and verified that I am speaking with the correct person using two identifiers.  Patient Location: Home  Provider Location: Office/Clinic  I discussed the limitations of evaluation and management by telemedicine. The patient expressed understanding and agreed to proceed.  Vital Signs: Because this visit was a virtual/telehealth visit, some criteria may be missing or patient reported. Any vitals not documented were not able to be obtained and vitals that have been documented are patient reported.  VideoDeclined- This patient declined Librarian, academic. Therefore the visit was completed with audio only.  Persons Participating in Visit: Patient assisted by Daughter, Beckett Maden.  AWV Questionnaire: Yes: Patient Medicare AWV questionnaire was completed by the patient on 02/14/2024; I have confirmed that all information answered by patient is correct and no changes since this date.  Cardiac Risk Factors include: advanced age (>38men, >74 women);diabetes mellitus;dyslipidemia;hypertension;male gender     Objective:    Today's Vitals   02/18/24 1015  Weight: 203 lb (92.1 kg)  Height: 5' 11 (1.803 m)   Body mass index is 28.31 kg/m.     02/18/2024   10:14 AM 11/24/2022    8:57 AM 11/18/2021   10:06 AM 10/16/2020   12:58 PM 04/25/2020    6:47 AM 04/20/2020   10:24 AM 07/22/2018    9:50 AM  Advanced Directives  Does Patient Have a Medical Advance Directive? Yes Yes Yes Yes No No No   Type of Special educational needs teacher of [place];Living will Healthcare Power of [place];Living will Healthcare Power of [place];Living will Living will;Healthcare Power of Attorney     Does patient want to make changes to medical advance directive? No - Patient declined No - Patient declined  No - Patient declined     Copy of Healthcare Power of Attorney in Chart? Yes - validated most recent copy scanned in chart (See row information) Yes - validated most recent copy scanned in chart (See row information) No - copy requested No - copy requested     Would patient like information on creating a medical advance directive?     No - Patient declined No - Patient declined No - Patient declined      Data saved with a previous flowsheet row definition    Current Medications (verified) Outpatient Encounter Medications as of 02/18/2024  Medication Sig   albuterol (VENTOLIN HFA) 108 (90 Base) MCG/ACT inhaler INHALE 2 PUFFS BY MOUTH FOUR TIMES A DAY AS NEEDED FOR SHORTNESS OF BREATH   aspirin  EC 81 MG tablet Take 81 mg by mouth daily.   Cholecalciferol  25 MCG (1000 UT) tablet Take by mouth.   Cinnamon 500 MG capsule Take 2,000 mg by mouth daily.   clotrimazole  (LOTRIMIN ) 1 % cream APPLY SMALL AMOUNT TO AFFECTED AREA TWICE A DAY   clotrimazole -betamethasone  (LOTRISONE ) cream Apply 1 Application topically daily.   cyanocobalamin  500 MCG tablet Take 500 mcg by mouth daily. Vitamin B12   docusate calcium  (SURFAK) 240 MG capsule Take 240 mg by mouth at bedtime.   Evolocumab  (REPATHA  SURECLICK) 140 MG/ML SOAJ INJECT  140 MG INTO THE SKIN EVERY 14 (FOURTEEN) DAYS. PT. IS OVERDUE FOR FOLLOWUP.   isosorbide  mononitrate (IMDUR ) 60 MG 24 hr tablet Take 1 tablet (60 mg total) by mouth daily.   Lifitegrast 5 % SOLN INSTILL 1 DROP IN EACH EYE TWICE A DAY   losartan  (COZAAR ) 25 MG tablet TAKE ONE TABLET BY MOUTH EVERY MORNING FOR HEART   melatonin 3 MG TABS tablet Take 3 mg by mouth at bedtime.   Multiple Vitamins-Minerals (PRESERVISION  AREDS 2) CAPS Take 1 capsule by mouth in the morning and at bedtime.   nitroGLYCERIN  (NITROSTAT ) 0.4 MG SL tablet Place 1 tablet (0.4 mg total) under the tongue every 5 (five) minutes as needed for chest pain.   pantoprazole  (PROTONIX ) 40 MG tablet TAKE ONE TABLET BY MOUTH TWICE A DAY (TAKE ON AN EMPTY STOMACH 30 MINUTES PRIOR TO A MEAL)   Polyethyl Glycol-Propyl Glycol (SYSTANE OP) Place 1 drop into both eyes 4 (four) times daily as needed (dry eyes).   Propylene Glycol 0.6 % SOLN INSTILL 1 DROP IN EACH EYE FOUR TIMES A DAY   traMADol  (ULTRAM ) 50 MG tablet 1 tablet (50 mg total) every 6 (six) hours as needed.   tretinoin (RETIN-A) 0.1 % cream APPLY THIN LAYER TO AFFECTED AREA NIGHTLY AS DIRECTED. APPLY A SMALL AMOUNT TO THE BUMPS ON THE ARMS AND CHEST NIGHTLY   triamcinolone  ointment (KENALOG ) 0.1 % Apply 1 application topically 2 (two) times daily.   valACYclovir  (VALTREX ) 500 MG tablet Take 500 mg by mouth daily.   [DISCONTINUED] busPIRone (BUSPAR) 10 MG tablet    [DISCONTINUED] busPIRone (BUSPAR) 10 MG tablet TAKE ONE-HALF TABLET BY MOUTH DAILY AS NEEDED FOR ANXIETY   [DISCONTINUED] doxycycline  (VIBRA -TABS) 100 MG tablet Take 1 tablet (100 mg total) by mouth 2 (two) times daily.   [DISCONTINUED] ibuprofen (ADVIL) 600 MG tablet Take by mouth.   No facility-administered encounter medications on file as of 02/18/2024.    Allergies (verified) Bee venom, Amlodipine , Livalo  [pitavastatin ], Oxycodone , Pravastatin sodium, Zetia  [ezetimibe ], Rosuvastatin, and Statins   History: Past Medical History:  Diagnosis Date   Allergy    BPH (benign prostatic hypertrophy)    Carotid artery occlusion    Diverticulosis of colon    ED (erectile dysfunction)    GERD (gastroesophageal reflux disease)    Glucose intolerance (impaired glucose tolerance)    Hearing loss    no hearing aids   History of pancreatitis    HSV infection    left eye   Hx-TIA (transient ischemic attack)    Hyperlipidemia     Hypertension    Hypertention, malignant, with acute intensive management    Low back pain    Osteoarthritis    PVD (peripheral vascular disease) (HCC)    Bilateral carotid   Tubular adenoma of colon 08/1991   One with carcinoma IN SITU 1993, multiple adenomatous   Wears partial dentures    upper   Past Surgical History:  Procedure Laterality Date   APPENDECTOMY     BASAL CELL CARCINOMA EXCISION     check and ear lobe   BLEPHAROPLASTY Left    x 3   CHOLECYSTECTOMY     COLONOSCOPY     ESOPHAGOGASTRODUODENOSCOPY     LEFT HEART CATH AND CORONARY ANGIOGRAPHY N/A 01/26/2017   Procedure: LEFT HEART CATH AND CORONARY ANGIOGRAPHY;  Surgeon: Court Dorn PARAS, MD;  Location: [facility];  Service: Cardiovascular;  Laterality: N/A;   LUMBAR SPINE SURGERY     X4  TRANSCAROTID ARTERY REVASCULARIZATION  Right 04/25/2020   Procedure: RIGHT TRANSCAROTID ARTERY REVASCULARIZATION;  Surgeon: Serene Gaile ORN, MD;  Location: [facility] OR;  Service: Vascular;  Laterality: Right;   ULTRASOUND GUIDANCE FOR VASCULAR ACCESS Right 04/25/2020   Procedure: ULTRASOUND GUIDANCE FOR VASCULAR ACCESS;  Surgeon: Serene Gaile ORN, MD;  Location: [facility] OR;  Service: Vascular;  Laterality: Right;   Family History  Problem Relation Age of Onset   Dementia Father    Diabetes Mother    Peripheral vascular disease Brother    Colon cancer Sister        dx in her 46's   Social History   Socioeconomic History   Marital status: Widowed    Spouse name: Not on file   Number of children: 3   Years of education: Not on file   Highest education level: 6th grade  Occupational History   Occupation: Retired-Construction  Tobacco Use   Smoking status: Former    Current packs/day: 0.00    Average packs/day: 0.5 packs/day for 20.0 years (10.0 ttl pk-yrs)    Types: Cigarettes    Start date: 67    Quit date: 8    Years since quitting: 55.7    Passive exposure: Never   Smokeless tobacco: Never   Tobacco comments:     Daily caffeine use, regular exercise  Vaping Use   Vaping status: Never Used  Substance and Sexual Activity   Alcohol  use: Yes    Alcohol /week: 1.0 - 2.0 standard drink of alcohol     Types: 1 - 2 Cans of beer per week   Drug use: No   Sexual activity: Not Currently  Other Topics Concern   Not on file  Social History Narrative   Not on file   Social Drivers of Health   Financial Resource Strain: Low Risk  (02/18/2024)   Overall Financial Resource Strain (CARDIA)    Difficulty of Paying Living Expenses: Not hard at all  Food Insecurity: No Food Insecurity (02/18/2024)   Hunger Vital Sign    Worried About Running Out of Food in the Last Year: Never true    Ran Out of Food in the Last Year: Never true  Transportation Needs: No Transportation Needs (02/18/2024)   PRAPARE - Administrator, Civil Service (Medical): No    Lack of Transportation (Non-Medical): No  Physical Activity: Sufficiently Active (02/18/2024)   Exercise Vital Sign    Days of Exercise per Week: 7 days    Minutes of Exercise per Session: 150+ min  Stress: No Stress Concern Present (02/18/2024)   [employer] of Occupational Health - Occupational Stress Questionnaire    Feeling of Stress: Not at all  Social Connections: Moderately Isolated (02/18/2024)   Social Connection and Isolation Panel    Frequency of Communication with Friends and Family: More than three times a week    Frequency of Social Gatherings with Friends and Family: More than three times a week    Attends Religious Services: Never    Database administrator or Organizations: Yes    Attends Engineer, structural: More than 4 times per year    Marital Status: Widowed    Tobacco Counseling Counseling given: Not Answered Tobacco comments: Daily caffeine use, regular exercise    Clinical Intake:  Pre-visit preparation completed: Yes  Pain : No/denies pain     BMI - recorded: 28.31 Nutritional Status: BMI of 19-24   Normal Nutritional Risks: None Diabetes: Yes CBG done?: No Did pt.  bring in CBG monitor from home?: No  Lab Results  Component Value Date   HGBA1C 6.8 (H) 10/16/2021   HGBA1C 6.9 (H) 07/12/2021   HGBA1C 7.0 (H) 04/12/2021     How often do you need to have someone help you when you read instructions, pamphlets, or other written materials from your doctor or pharmacy?: 5 - Always (Dtr assists)  Interpreter Needed?: No  Information entered by :: Verdie Saba, CMA   Activities of Daily Living     02/18/2024   10:21 AM 02/14/2024   12:36 PM  In your present state of health, do you have any difficulty performing the following activities:  Hearing? 1 1  Comment has hearing aids but does not wear   Vision? 0 0  Difficulty concentrating or making decisions? 1 1  Comment Dtr assists   Walking or climbing stairs? 1 1  Comment uses a walker/electric scooter   Dressing or bathing? 0 0  Doing errands, shopping? 1 0  Comment Dtr assists   Preparing Food and eating ? N N  Using the Toilet? N N  In the past six months, have you accidently leaked urine? N N  Do you have problems with loss of bowel control? N N  Managing your Medications? Y Y  Comment Dtr assists   Managing your Finances? Y Y  Comment Dtr assists   Housekeeping or managing your Housekeeping? N N  Comment Dtr assists     Patient Care Team: Plotnikov, Karlynn GAILS, MD as PCP - General Raford Riggs, MD as PCP - Cardiology (Cardiology) Raford Riggs, MD as Attending Physician (Cardiology) Szabat, Toribio BROCKS, [facility] (Inactive) as Pharmacist (Pharmacist)  I have updated your Care Teams any recent Medical Services you may have received from other providers in the past year.     Assessment:   This is a routine wellness examination for Arby.  Hearing/Vision screen Hearing Screening - Comments:: Has hearing aids Vision Screening - Comments:: Wears rx glasses - up to date with routine eye exams with the [facility]  in [place], [place]   Goals Addressed               This Visit's Progress     Patient Stated (pt-stated)        Patient stated he plans to stay active       Depression Screen     02/18/2024   10:25 AM 11/24/2022    8:54 AM 11/06/2022    8:29 AM 11/18/2021   10:07 AM 11/18/2021   10:05 AM 07/16/2021   10:06 AM 10/16/2020   12:57 PM  PHQ 2/9 Scores  PHQ - 2 Score 0 0 0 0 0 0 0  PHQ- 9 Score 0 0         Fall Risk     02/18/2024   10:24 AM 02/14/2024   12:36 PM 01/15/2024    3:37 PM 11/24/2022    8:59 AM 11/19/2022   10:40 AM  Fall Risk   Falls in the past year? 0 0 0 0 0  Number falls in past yr: 0   0 0  Injury with Fall? 0   0 0  Risk for fall due to : No Fall Risks   No Fall Risks   Follow up Falls evaluation completed;Falls prevention discussed   Falls prevention discussed     MEDICARE RISK AT HOME:  Medicare Risk at Home Any stairs in or around the home?: Yes If so, are there any  without handrails?: No Home free of loose throw rugs in walkways, pet beds, electrical cords, etc?: Yes Adequate lighting in your home to reduce risk of falls?: Yes Life alert?: No Use of a cane, walker or w/c?: Yes (walker/electric scooter) Grab bars in the bathroom?: Yes Shower chair or bench in shower?: Yes Elevated toilet seat or a handicapped toilet?: Yes  TIMED UP AND GO:  Was the test performed?  No  Cognitive Function: 6CIT completed    10/30/2014    9:12 AM  MMSE - Mini Mental State Exam  Not completed: Unable to complete        02/18/2024   10:25 AM 11/24/2022    9:00 AM  6CIT Screen  What Year? 0 points 0 points  What month? 0 points 0 points  What time? 0 points 0 points  Count back from 20 0 points 0 points  Months in reverse 0 points 0 points  Repeat phrase 0 points 0 points  Total Score 0 points 0 points    Immunizations Immunization History  Administered Date(s) Administered    sv, Bivalent, Protein Subunit Rsvpref,pf (Abrysvo) 09/18/2022   Fluad  Quad(high Dose 65+) 03/27/2020, 04/22/2021   INFLUENZA, HIGH DOSE SEASONAL PF 02/18/2016, 03/09/2016, 04/16/2017, 03/03/2018, 03/30/2019, 03/09/2021   Influenza Split 02/08/2015, 01/08/2016   Influenza-Unspecified 07/11/2015, 03/10/2018, 03/10/2022   Moderna Covid-19 Vaccine Bivalent Booster 38yrs & up 04/22/2021   Moderna Sars-Covid-2 Vaccination 07/19/2019, 08/16/2019   Pneumococcal Conjugate-13 08/01/2013   Pneumococcal Polysaccharide-23 07/20/2015   Td 02/23/2008   Td (Adult),5 Lf Tetanus Toxid, Preservative Free 12/20/2020   Td,absorbed, Preservative Free, Adult Use, Lf Unspecified 12/20/2020   Tdap 11/08/2010   Tetanus Immune Globulin 02/23/2008   Zoster Recombinant(Shingrix) 10/05/2020, 12/20/2020    Screening Tests Health Maintenance  Topic Date Due   FOOT EXAM  Never done   HEMOGLOBIN A1C  04/18/2022   Influenza Vaccine  01/08/2024   COVID-19 Vaccine (4 - 2025-26 season) 02/08/2024   OPHTHALMOLOGY EXAM  01/17/2025   Medicare Annual Wellness (AWV)  02/17/2025   DTaP/Tdap/Td (5 - Td or Tdap) 12/21/2030   Pneumococcal Vaccine: 50+ Years  Completed   Zoster Vaccines- Shingrix  Completed   HPV VACCINES  Aged Out   Meningococcal B Vaccine  Aged Out    Health Maintenance Items Addressed: 02/18/2024  Additional Screening:  Vision Screening: Recommended annual ophthalmology exams for early detection of glaucoma and other disorders of the eye. Is the patient up to date with their annual eye exam?  Yes  Who is the provider or what is the name of the office in which the patient attends annual eye exams? [facility] in [place], [place]  Dental Screening: Recommended annual dental exams for proper oral hygiene  Community Resource Referral / Chronic Care Management: CRR required this visit?  No   CCM required this visit?  No   Plan:    I have personally reviewed and noted the following in the patient's chart:   Medical and social history Use of alcohol , tobacco or  illicit drugs  Current medications and supplements including opioid prescriptions. Patient is not currently taking opioid prescriptions. Functional ability and status Nutritional status Physical activity Advanced directives List of other physicians Hospitalizations, surgeries, and ER visits in previous 12 months Vitals Screenings to include cognitive, depression, and falls Referrals and appointments  In addition, I have reviewed and discussed with patient certain preventive protocols, quality metrics, and best practice recommendations. A written personalized care plan for preventive services as well  as general preventive health recommendations were provided to patient.   Verdie CHRISTELLA Saba, CMA   02/18/2024   After Visit Summary: (MyChart) Due to this being a telephonic visit, the after visit summary with patients personalized plan was offered to patient via MyChart   Notes: Scheduled a Physical w/PCP in 04/2024.    Medical screening examination/treatment/procedure(s) were performed by non-physician practitioner and as supervising physician I was immediately available for consultation/collaboration.  I agree with above. Karlynn Noel, MD

## 2024-02-24 ENCOUNTER — Other Ambulatory Visit: Payer: Self-pay

## 2024-02-24 DIAGNOSIS — I6523 Occlusion and stenosis of bilateral carotid arteries: Secondary | ICD-10-CM

## 2024-03-28 ENCOUNTER — Encounter (HOSPITAL_COMMUNITY)

## 2024-03-28 ENCOUNTER — Ambulatory Visit

## 2024-04-18 ENCOUNTER — Telehealth: Payer: Self-pay | Admitting: Cardiovascular Disease

## 2024-04-18 ENCOUNTER — Telehealth (HOSPITAL_BASED_OUTPATIENT_CLINIC_OR_DEPARTMENT_OTHER): Payer: Self-pay

## 2024-04-18 NOTE — Telephone Encounter (Signed)
 Pts aesthetic office calling to ask if fax of clearance was received, and when an approval would be faxed back. Explained that there was no clearance on file but we could do one over the phone. Caller refused and requested clearance be faxed. I explained a clearance would have to be requested first, and stated again we could do it over the phone. Caller disconnected. Please advise.  [phone]

## 2024-04-18 NOTE — Telephone Encounter (Signed)
   Pre-operative Risk Assessment    Patient Name: Richard Burgess  DOB: [birth year] MRN: [id number]   Date of last office visit: 04/30/2020 - Dr. Raford Date of next office visit: N/A  Request for Surgical Clearance    Procedure:  Right lower lid ectropion repair   Date of Surgery:  Clearance 04/25/24                                 Surgeon:  Dr, Renzo Zaldivar  Surgeon's Group or Practice Name:  [facility]  Phone number:  [phone] Fax number:  [phone]   Type of Clearance Requested:   - Medical  - Pharmacy:  [facility]  - Does not specify    Type of Anesthesia:  MAC   Additional requests/questions:  N/A  Richard Burgess   04/18/2024, 4:43 PM

## 2024-04-19 ENCOUNTER — Encounter: Admitting: Internal Medicine

## 2024-04-19 NOTE — Telephone Encounter (Signed)
   Name: LORREN SPLAWN  DOB: November 21, 1928  MRN: [id number]  Primary Cardiologist: Annabella Scarce, MD  Chart reviewed as part of pre-operative protocol coverage. Because of Cornie Herrington Brockel's past medical history and time since last visit, he will require a new patient in-office visit in order to better assess preoperative cardiovascular risk.  Pre-op covering staff: - Please schedule appointment and call patient to inform them. If patient already had an upcoming appointment within acceptable timeframe, please add pre-op clearance to the appointment notes so provider is aware. - Please contact requesting surgeon's office via preferred method (i.e, phone, fax) to inform them of need for appointment prior to surgery.  Joaquim Tolen E Zaccary Creech, NP  04/19/2024, 10:13 AM

## 2024-04-19 NOTE — Telephone Encounter (Signed)
 I left message pt that he will need new pt appt, last seen by Dr. Raford 2021. I also s/w Dr. Eulalio office pt will need new pt appt. Dr. Eulalio office informed me they cancelled appt 04/25/24 as pt needs new pt appt with cardiology.

## 2024-04-19 NOTE — Telephone Encounter (Signed)
 I will forward back to preop APP to review it pt will need MD new pt appt or if the pt can be seen on HF[facility]st provider schedule. Pt last seen by DR. Vernon 2021.       See notes below before request sent to our office.    Richard Burgess [facility]   04/18/24  2:13 PM Note Pts aesthetic office calling to ask if fax of clearance was received, and when an approval would be faxed back. Explained that there was no clearance on file but we could do one over the phone. Caller refused and requested clearance be faxed. I explained a clearance would have to be requested first, and stated again we could do it over the phone. Caller disconnected. Please advise.   [phone]        04/18/24  2:05 PM (Name not recorded) contacted Richard Burgess

## 2024-04-19 NOTE — Telephone Encounter (Signed)
 Patient is not appropriate for [facility], as he is only a new patient from the lack of follow up within a 3 year period and previously had an extensive cardiac history that warrants review by an MD.

## 2024-04-20 NOTE — Telephone Encounter (Signed)
 S/w the pt's daughter Nathanel [facility]). Nathanel tells me that they are trying to get the [place] to do his clearance for the preop. She does agree to have pt come in as new pt with 06/06/24 Dr. Kate. Pt last seen by Dr. Raford 2021, though no new pt's appt available.    Pt's daughter asked did the pt need to our practice as he see's the [place] cardiology. I stated why don't we schedule, have her dad come in and s/w the cardiologist to discuss further if need to still follow [facility]. Nathanel agrees and thanked me for the call.

## 2024-05-03 ENCOUNTER — Encounter: Payer: Self-pay | Admitting: Internal Medicine

## 2024-05-03 ENCOUNTER — Ambulatory Visit (INDEPENDENT_AMBULATORY_CARE_PROVIDER_SITE_OTHER)

## 2024-05-03 ENCOUNTER — Ambulatory Visit (INDEPENDENT_AMBULATORY_CARE_PROVIDER_SITE_OTHER): Admitting: Internal Medicine

## 2024-05-03 ENCOUNTER — Ambulatory Visit: Payer: Self-pay | Admitting: Internal Medicine

## 2024-05-03 VITALS — BP 144/58 | HR 76 | Temp 98.4°F | Ht 71.0 in

## 2024-05-03 DIAGNOSIS — E538 Deficiency of other specified B group vitamins: Secondary | ICD-10-CM

## 2024-05-03 DIAGNOSIS — J069 Acute upper respiratory infection, unspecified: Secondary | ICD-10-CM

## 2024-05-03 DIAGNOSIS — E1159 Type 2 diabetes mellitus with other circulatory complications: Secondary | ICD-10-CM

## 2024-05-03 DIAGNOSIS — Z0000 Encounter for general adult medical examination without abnormal findings: Secondary | ICD-10-CM

## 2024-05-03 DIAGNOSIS — I712 Thoracic aortic aneurysm, without rupture, unspecified: Secondary | ICD-10-CM | POA: Diagnosis not present

## 2024-05-03 DIAGNOSIS — N320 Bladder-neck obstruction: Secondary | ICD-10-CM

## 2024-05-03 DIAGNOSIS — M25552 Pain in left hip: Secondary | ICD-10-CM

## 2024-05-03 DIAGNOSIS — I2583 Coronary atherosclerosis due to lipid rich plaque: Secondary | ICD-10-CM | POA: Diagnosis not present

## 2024-05-03 DIAGNOSIS — E559 Vitamin D deficiency, unspecified: Secondary | ICD-10-CM | POA: Diagnosis not present

## 2024-05-03 DIAGNOSIS — M51369 Other intervertebral disc degeneration, lumbar region without mention of lumbar back pain or lower extremity pain: Secondary | ICD-10-CM | POA: Diagnosis not present

## 2024-05-03 DIAGNOSIS — I2510 Atherosclerotic heart disease of native coronary artery without angina pectoris: Secondary | ICD-10-CM

## 2024-05-03 LAB — COMPREHENSIVE METABOLIC PANEL WITH GFR
ALT: 9 U/L (ref 0–53)
AST: 13 U/L (ref 0–37)
Albumin: 4.4 g/dL (ref 3.5–5.2)
Alkaline Phosphatase: 83 U/L (ref 39–117)
BUN: 15 mg/dL (ref 6–23)
CO2: 27 meq/L (ref 19–32)
Calcium: 9.7 mg/dL (ref 8.4–10.5)
Chloride: 105 meq/L (ref 96–112)
Creatinine, Ser: 0.87 mg/dL (ref 0.40–1.50)
GFR: 74.44 mL/min (ref >60.00)
Glucose, Bld: 119 mg/dL — ABNORMAL HIGH (ref 70–99)
Potassium: 4.4 meq/L (ref 3.5–5.1)
Sodium: 139 meq/L (ref 135–145)
Total Bilirubin: 1.2 mg/dL (ref 0.2–1.2)
Total Protein: 7 g/dL (ref 6.0–8.3)

## 2024-05-03 LAB — URINALYSIS
Bilirubin Urine: NEGATIVE
Hgb urine dipstick: NEGATIVE
Ketones, ur: NEGATIVE
Leukocytes,Ua: NEGATIVE
Nitrite: NEGATIVE
Specific Gravity, Urine: 1.01 (ref 1.000–1.030)
Total Protein, Urine: NEGATIVE
Urine Glucose: NEGATIVE
Urobilinogen, UA: 0.2 (ref 0.0–1.0)
pH: 6.5 (ref 5.0–8.0)

## 2024-05-03 LAB — CBC WITH DIFFERENTIAL/PLATELET
Basophils Absolute: 0.1 K/uL (ref 0.0–0.1)
Basophils Relative: 0.6 % (ref 0.0–3.0)
Eosinophils Absolute: 0.3 K/uL (ref 0.0–0.7)
Eosinophils Relative: 3.1 % (ref 0.0–5.0)
HCT: 40.7 % (ref 39.0–52.0)
Hemoglobin: 13.9 g/dL (ref 13.0–17.0)
Lymphocytes Relative: 23.4 % (ref 12.0–46.0)
Lymphs Abs: 2.2 K/uL (ref 0.7–4.0)
MCHC: 34.1 g/dL (ref 30.0–36.0)
MCV: 91.5 fl (ref 78.0–100.0)
Monocytes Absolute: 1 K/uL (ref 0.1–1.0)
Monocytes Relative: 10.1 % (ref 3.0–12.0)
Neutro Abs: 5.9 K/uL (ref 1.4–7.7)
Neutrophils Relative %: 62.8 % (ref 43.0–77.0)
Platelets: 228 K/uL (ref 150.0–400.0)
RBC: 4.45 Mil/uL (ref 4.22–5.81)
RDW: 14.4 % (ref 11.5–15.5)
WBC: 9.5 K/uL (ref 4.0–10.5)

## 2024-05-03 LAB — TSH: TSH: 2.23 u[IU]/mL (ref 0.35–5.50)

## 2024-05-03 LAB — VITAMIN D 25 HYDROXY (VIT D DEFICIENCY, FRACTURES): VITD: 31.77 ng/mL (ref 30.00–100.00)

## 2024-05-03 LAB — PSA: PSA: 0.16 ng/mL (ref 0.10–4.00)

## 2024-05-03 LAB — HEMOGLOBIN A1C: Hgb A1c MFr Bld: 6.5 % (ref 4.6–6.5)

## 2024-05-03 LAB — VITAMIN B12: Vitamin B-12: 510 pg/mL (ref 211–911)

## 2024-05-03 MED ORDER — IPRATROPIUM-ALBUTEROL 0.5-2.5 (3) MG/3ML IN SOLN: 3.0000 mL | Freq: Four times a day (QID) | RESPIRATORY_TRACT | 3 refills | Status: AC | PRN

## 2024-05-03 MED ORDER — METHYLPREDNISOLONE 4 MG PO TBPK: ORAL_TABLET | ORAL | 0 refills | Status: AC

## 2024-05-03 MED ORDER — CETIRIZINE HCL 10 MG PO TABS: 10.0000 mg | ORAL_TABLET | Freq: Every day | ORAL | 3 refills | Status: AC

## 2024-05-03 NOTE — Assessment & Plan Note (Signed)

## 2024-05-03 NOTE — Assessment & Plan Note (Signed)
 Check B12

## 2024-05-03 NOTE — Assessment & Plan Note (Signed)
 H/o MI 2018: Prox RCA to Mid RCA lesion, 100 %stenosed. Ost LM lesion, 40 %stenosed. Ost LAD lesion, 60 %stenosed. 1st Mrg lesion, 80 %stenosed. There is mild to moderate left ventricular systolic dysfunction. LV end diastolic pressure is mildly elevated. The left ventricular ejection fraction is 45-50% by visual estimate.   Dr Raford

## 2024-05-03 NOTE — Assessment & Plan Note (Signed)
No abd pain 

## 2024-05-03 NOTE — Assessment & Plan Note (Addendum)
 Check A1c CAD, h/o MI

## 2024-05-03 NOTE — Progress Notes (Signed)
 Subjective:  Patient ID: Richard Burgess, male    DOB: 02/21/1929  Age: 89 y.o. MRN: [id number]  CC: Annual Exam (Annual Exam)   HPI Richard Burgess presents for a well exam C/o cough since Sept - yellow mucus x3 UC visits, had a CXR C/o L groin pain - bad 2 d ago He is here w/Kathy, his dtr  Outpatient Medications Prior to Visit  Medication Sig Dispense Refill   albuterol  (VENTOLIN  HFA) 108 (90 Base) MCG/ACT inhaler INHALE 2 PUFFS BY MOUTH FOUR TIMES A DAY AS NEEDED FOR SHORTNESS OF BREATH     aspirin  EC 81 MG tablet Take 81 mg by mouth daily.     Cholecalciferol  25 MCG (1000 UT) tablet Take by mouth.     Cinnamon 500 MG capsule Take 2,000 mg by mouth daily.     clotrimazole  (LOTRIMIN ) 1 % cream APPLY SMALL AMOUNT TO AFFECTED AREA TWICE A DAY     clotrimazole -betamethasone  (LOTRISONE ) cream Apply 1 Application topically daily. 30 g 0   cyanocobalamin  500 MCG tablet Take 500 mcg by mouth daily. Vitamin B12     docusate calcium  (SURFAK) 240 MG capsule Take 240 mg by mouth at bedtime.     Evolocumab  (REPATHA  SURECLICK) 140 MG/ML SOAJ INJECT 140 MG INTO THE SKIN EVERY 14 (FOURTEEN) DAYS. PT. IS OVERDUE FOR FOLLOWUP. 6 mL 3   isosorbide  mononitrate (IMDUR ) 60 MG 24 hr tablet Take 1 tablet (60 mg total) by mouth daily. 90 tablet 3   Lifitegrast 5 % SOLN INSTILL 1 DROP IN EACH EYE TWICE A DAY     losartan  (COZAAR ) 25 MG tablet TAKE ONE TABLET BY MOUTH EVERY MORNING FOR HEART     melatonin 3 MG TABS tablet Take 3 mg by mouth at bedtime.     Multiple Vitamins-Minerals (PRESERVISION AREDS 2) CAPS Take 1 capsule by mouth in the morning and at bedtime.     nitroGLYCERIN  (NITROSTAT ) 0.4 MG SL tablet Place 1 tablet (0.4 mg total) under the tongue every 5 (five) minutes as needed for chest pain. 20 tablet 1   pantoprazole  (PROTONIX ) 40 MG tablet TAKE ONE TABLET BY MOUTH TWICE A DAY (TAKE ON AN EMPTY STOMACH 30 MINUTES PRIOR TO A MEAL)     Polyethyl Glycol-Propyl Glycol (SYSTANE OP) Place 1 drop into  both eyes 4 (four) times daily as needed (dry eyes).     Propylene Glycol 0.6 % SOLN INSTILL 1 DROP IN EACH EYE FOUR TIMES A DAY     traMADol  (ULTRAM ) 50 MG tablet 1 tablet (50 mg total) every 6 (six) hours as needed.     tretinoin (RETIN-A) 0.1 % cream APPLY THIN LAYER TO AFFECTED AREA NIGHTLY AS DIRECTED. APPLY A SMALL AMOUNT TO THE BUMPS ON THE ARMS AND CHEST NIGHTLY     triamcinolone  ointment (KENALOG ) 0.1 % Apply 1 application topically 2 (two) times daily. 450 g 3   valACYclovir  (VALTREX ) 500 MG tablet Take 500 mg by mouth daily.     No facility-administered medications prior to visit.    ROS: Review of Systems  Constitutional:  Negative for appetite change, fatigue and unexpected weight change.  HENT:  Positive for congestion. Negative for nosebleeds, sneezing, sore throat and trouble swallowing.   Eyes:  Negative for itching and visual disturbance.  Respiratory:  Positive for cough and wheezing.   Cardiovascular:  Negative for chest pain, palpitations and leg swelling.  Gastrointestinal:  Negative for abdominal distention, abdominal pain, blood in stool, diarrhea and nausea.  Genitourinary:  Negative for frequency and hematuria.  Musculoskeletal:  Positive for arthralgias, back pain and gait problem. Negative for joint swelling and neck pain.  Skin:  Negative for rash.  Neurological:  Negative for dizziness, tremors, speech difficulty and weakness.  Hematological:  Bruises/bleeds easily.  Psychiatric/Behavioral:  Positive for decreased concentration. Negative for agitation, behavioral problems, confusion, dysphoric mood, sleep disturbance and suicidal ideas. The patient is not nervous/anxious.     Objective:  BP (!) 144/58   Pulse 76   Temp 98.4 F (36.9 C)   Ht 5' 11 (1.803 m)   SpO2 97%   BMI 28.31 kg/m   BP Readings from Last 3 Encounters:  05/03/24 (!) 144/58  01/19/23 (!) 180/94  11/06/22 118/70    Wt Readings from Last 3 Encounters:  02/18/24 203 lb (92.1 kg)   01/19/23 195 lb (88.5 kg)  11/24/22 199 lb (90.3 kg)    Physical Exam Constitutional:      General: He is not in acute distress.    Appearance: He is well-developed. He is obese. He is not ill-appearing.     Comments: NAD  HENT:     Nose: Congestion present.  Eyes:     Conjunctiva/sclera: Conjunctivae normal.     Pupils: Pupils are equal, round, and reactive to light.  Neck:     Thyroid : No thyromegaly.     Vascular: No JVD.  Cardiovascular:     Rate and Rhythm: Normal rate and regular rhythm.     Heart sounds: Normal heart sounds. No murmur heard.    No friction rub. No gallop.  Pulmonary:     Effort: Pulmonary effort is normal. No respiratory distress.     Breath sounds: Rhonchi present. No wheezing or rales.  Chest:     Chest wall: No tenderness.  Abdominal:     General: Bowel sounds are normal. There is no distension.     Palpations: Abdomen is soft. There is no mass.     Tenderness: There is no abdominal tenderness. There is no guarding or rebound.  Musculoskeletal:        General: Tenderness present. Normal range of motion.     Cervical back: Normal range of motion.     Right lower leg: No edema.     Left lower leg: No edema.  Lymphadenopathy:     Cervical: No cervical adenopathy.  Skin:    General: Skin is warm and dry.     Findings: Bruising present. No rash.  Neurological:     Mental Status: He is alert and oriented to person, place, and time.     Cranial Nerves: No cranial nerve deficit.     Motor: No abnormal muscle tone.     Coordination: Coordination normal.     Gait: Gait abnormal.     Deep Tendon Reflexes: Reflexes are normal and symmetric.  Psychiatric:        Behavior: Behavior normal.        Thought Content: Thought content normal.        Judgment: Judgment normal.   L hip - pain w/ROM Arthritic gait Small R ing hernia - NT Testes nl R lower lid ectropion   Lab Results  Component Value Date   WBC 5.1 01/23/2022   HGB 13.5 01/23/2022    HCT 40.0 01/23/2022   PLT 146.0 (L) 01/23/2022   GLUCOSE 132 (H) 01/23/2022   CHOL 104 07/07/2023   TRIG 104 07/07/2023   HDL 40 07/07/2023   LDLDIRECT 48  05/08/2022   LDLCALC 44 07/07/2023   ALT 18 01/23/2022   AST 20 01/23/2022   NA 139 01/23/2022   K 4.2 01/23/2022   CL 105 01/23/2022   CREATININE 1.01 01/23/2022   BUN 15 01/23/2022   CO2 25 01/23/2022   TSH 3.29 04/12/2021   PSA 0.28 03/23/2014   INR 1.0 04/20/2020   HGBA1C 6.8 (H) 10/16/2021   MICROALBUR 65 12/26/2020    VAS US  CAROTID Result Date: 01/19/2023 Carotid Arterial Duplex Study Patient Name:  KHIAN REMO  Date of Exam:   01/19/2023 Medical Rec #: [id number]        Accession #:    [id number] Date of Birth: [birth year]         Patient Gender: M Patient Age:   63 years Exam Location:  [facility] Procedure:      VAS US  CAROTID Referring Phys: GAILE NEW --------------------------------------------------------------------------------  Indications:   Carotid artery disease and Right stent. Risk Factors:  Hypertension, hyperlipidemia. Other Factors: TCAR 04/25/2020. Performing Technologist: Devere Dark RVT  Examination Guidelines: A complete evaluation includes B-mode imaging, spectral Doppler, color Doppler, and power Doppler as needed of all accessible portions of each vessel. Bilateral testing is considered an integral part of a complete examination. Limited examinations for reoccurring indications may be performed as noted.  Right Carotid Findings: +----------+--------+--------+--------+------------------+--------+           PSV cm/sEDV cm/sStenosisPlaque DescriptionComments +----------+--------+--------+--------+------------------+--------+ CCA Prox  63      19              heterogenous               +----------+--------+--------+--------+------------------+--------+ CCA Mid   78      17              heterogenous                +----------+--------+--------+--------+------------------+--------+ CCA Distal78      16                                         +----------+--------+--------+--------+------------------+--------+ ICA Prox                                            stent    +----------+--------+--------+--------+------------------+--------+ ICA Mid   62      21                                         +----------+--------+--------+--------+------------------+--------+ ICA Distal45      14                                         +----------+--------+--------+--------+------------------+--------+ ECA       334     29      >50%                               +----------+--------+--------+--------+------------------+--------+ +----------+--------+-------+----------------+-------------------+           PSV cm/sEDV cmsDescribe        Arm Pressure (  mmHG) +----------+--------+-------+----------------+-------------------+ Subclavian221     11     Multiphasic, TWO852                 +----------+--------+-------+----------------+-------------------+ +---------+--------+--+--------+-+---------+ VertebralPSV cm/s23EDV cm/s7Antegrade +---------+--------+--+--------+-+---------+  Right Stent(s): +---------------+---+--++++ Prox to Stent  82 18 +---------------+---+--++++ Proximal Stent 70 19 +---------------+---+--++++ Mid Stent      76 21 +---------------+---+--++++ Distal Stent   10830 +---------------+---+--++++ Distal to Stent82 19 +---------------+---+--++++   Left Carotid Findings: +----------+--------+--------+--------+------------------+--------+           PSV cm/sEDV cm/sStenosisPlaque DescriptionComments +----------+--------+--------+--------+------------------+--------+ CCA Prox  76      17                                         +----------+--------+--------+--------+------------------+--------+ CCA Mid   78      16                                          +----------+--------+--------+--------+------------------+--------+ CCA Distal94      16                                         +----------+--------+--------+--------+------------------+--------+ ICA Prox  71      19      1-39%                              +----------+--------+--------+--------+------------------+--------+ ICA Mid   62      22                                         +----------+--------+--------+--------+------------------+--------+ ICA Distal60      19                                         +----------+--------+--------+--------+------------------+--------+ ECA       99      4                                          +----------+--------+--------+--------+------------------+--------+ +----------+--------+--------+----------------+-------------------+           PSV cm/sEDV cm/sDescribe        Arm Pressure (mmHG) +----------+--------+--------+----------------+-------------------+ Subclavian82      4       Multiphasic, TWO847                 +----------+--------+--------+----------------+-------------------+ +---------+--------+--+--------+--+---------+ VertebralPSV cm/s37EDV cm/s14Antegrade +---------+--------+--+--------+--+---------+   Summary: Right Carotid: Widely patent stent with no evidence of stenosis in the right                ICA. Left Carotid: Velocities in the left ICA are consistent with a 1-39% stenosis. Vertebrals:  Bilateral vertebral arteries demonstrate antegrade flow. Subclavians: Normal flow hemodynamics were seen in bilateral subclavian              arteries. *See table(s) above for measurements and observations.  Electronically signed by Gaile  Brabham MD on 01/19/2023 at 3:21:56 PM.    Final     Assessment & Plan:   Problem List Items Addressed This Visit     Aortic aneurysm (HCC)   No abd pain      B12 deficiency   Check B12      Relevant Orders   Vitamin B12   CAD (coronary  artery disease)   H/o MI 2018: Prox RCA to Mid RCA lesion, 100 %stenosed. Ost LM lesion, 40 %stenosed. Ost LAD lesion, 60 %stenosed. 1st Mrg lesion, 80 %stenosed. There is mild to moderate left ventricular systolic dysfunction. LV end diastolic pressure is mildly elevated. The left ventricular ejection fraction is 45-50% by visual estimate.   Dr Raford       Hip pain, acute, left   Likely OA X ray Tylenol  prn      Relevant Orders   DG HIP UNILAT WITH PELVIS 2-3 VIEWS LEFT (Completed)   Type 2 diabetes mellitus with vascular disease (HCC)   Check A1c CAD, h/o MI      Relevant Orders   Hemoglobin A1c   URI (upper respiratory infection)   S/p abx x 3 courses, he had a chest X ray - ok Medrol  pack for probable COPD Albuterol  tabs; can't use MDI Zyrtec  po       Well adult exam - Primary    We discussed age appropriate health related issues, including available/recomended screening tests and vaccinations. Labs were ordered to be later reviewed . All questions were answered. We discussed one or more of the following - seat belt use, use of sunscreen/sun exposure exercise, fall risk reduction, second hand smoke exposure, firearm use and storage, seat belt use, a need for adhering to healthy diet and exercise. Labs were ordered.  All questions were answered.          Relevant Orders   TSH   Urinalysis   CBC with Differential/Platelet   PSA   Comprehensive metabolic panel with GFR   Vitamin B12   VITAMIN D  25 Hydroxy (Vit-D Deficiency, Fractures)   Other Visit Diagnoses       Vitamin D  deficiency       Relevant Orders   VITAMIN D  25 Hydroxy (Vit-D Deficiency, Fractures)     Bladder neck obstruction       Relevant Orders   PSA         Meds ordered this encounter  Medications   methylPREDNISolone  (MEDROL  DOSEPAK) 4 MG TBPK tablet    Sig: As directed    Dispense:  21 tablet    Refill:  0   ipratropium-albuterol  (DUONEB) 0.5-2.5 (3) MG/3ML SOLN    Sig:  Take 3 mLs by nebulization every 6 (six) hours as needed.    Dispense:  120 mL    Refill:  3   cetirizine  (ZYRTEC ) 10 MG tablet    Sig: Take 1 tablet (10 mg total) by mouth daily.    Dispense:  30 tablet    Refill:  3      Follow-up: Return in about 6 weeks (around 06/14/2024) for a follow-up visit.  Marolyn Noel, MD

## 2024-05-03 NOTE — Assessment & Plan Note (Addendum)
 S/p abx x 3 courses, he had a chest X ray - ok Medrol  pack for probable COPD Albuterol  tabs; can't use MDI Zyrtec  po

## 2024-05-03 NOTE — Assessment & Plan Note (Signed)
 Likely OA X ray Tylenol  prn

## 2024-05-23 ENCOUNTER — Ambulatory Visit (HOSPITAL_COMMUNITY)
Admission: RE | Admit: 2024-05-23 | Discharge: 2024-05-23 | Disposition: A | Source: Ambulatory Visit | Attending: Surgery | Admitting: Surgery

## 2024-05-23 ENCOUNTER — Ambulatory Visit

## 2024-05-23 VITALS — BP 101/60 | HR 76 | Temp 97.7°F | Wt 200.0 lb

## 2024-05-23 DIAGNOSIS — I6523 Occlusion and stenosis of bilateral carotid arteries: Secondary | ICD-10-CM | POA: Diagnosis present

## 2024-05-23 NOTE — Progress Notes (Signed)
 Office Note   History of Present Illness   Richard Burgess is a 89 y.o. (December 04, 1928) male who presents for surveillance of carotid artery stenosis.  He has a history of right TCAR by Dr. Serene on 04/25/2020 for asymptomatic carotid artery stenosis.  He returns today for follow up.  He is doing well without any complaints. He denies any recent strokelike symptoms such as slurred speech, facial droop, sudden visual changes, or sudden weakness/numbness.  Current Outpatient Medications  Medication Sig Dispense Refill   albuterol  (VENTOLIN  HFA) 108 (90 Base) MCG/ACT inhaler INHALE 2 PUFFS BY MOUTH FOUR TIMES A DAY AS NEEDED FOR SHORTNESS OF BREATH     aspirin  EC 81 MG tablet Take 81 mg by mouth daily.     cetirizine  (ZYRTEC ) 10 MG tablet Take 1 tablet (10 mg total) by mouth daily. 30 tablet 3   Cholecalciferol  25 MCG (1000 UT) tablet Take by mouth.     Cinnamon 500 MG capsule Take 2,000 mg by mouth daily.     clotrimazole  (LOTRIMIN ) 1 % cream APPLY SMALL AMOUNT TO AFFECTED AREA TWICE A DAY     clotrimazole -betamethasone  (LOTRISONE ) cream Apply 1 Application topically daily. 30 g 0   cyanocobalamin  500 MCG tablet Take 500 mcg by mouth daily. Vitamin B12     docusate calcium  (SURFAK) 240 MG capsule Take 240 mg by mouth at bedtime.     Evolocumab  (REPATHA  SURECLICK) 140 MG/ML SOAJ INJECT 140 MG INTO THE SKIN EVERY 14 (FOURTEEN) DAYS. PT. IS OVERDUE FOR FOLLOWUP. 6 mL 3   ipratropium-albuterol  (DUONEB) 0.5-2.5 (3) MG/3ML SOLN Take 3 mLs by nebulization every 6 (six) hours as needed. 120 mL 3   isosorbide  mononitrate (IMDUR ) 60 MG 24 hr tablet Take 1 tablet (60 mg total) by mouth daily. 90 tablet 3   Lifitegrast 5 % SOLN INSTILL 1 DROP IN EACH EYE TWICE A DAY     losartan  (COZAAR ) 25 MG tablet TAKE ONE TABLET BY MOUTH EVERY MORNING FOR HEART     melatonin 3 MG TABS tablet Take 3 mg by mouth at bedtime.     methylPREDNISolone  (MEDROL  DOSEPAK) 4 MG TBPK tablet As directed 21 tablet 0   Multiple  Vitamins-Minerals (PRESERVISION AREDS 2) CAPS Take 1 capsule by mouth in the morning and at bedtime.     nitroGLYCERIN  (NITROSTAT ) 0.4 MG SL tablet Place 1 tablet (0.4 mg total) under the tongue every 5 (five) minutes as needed for chest pain. 20 tablet 1   pantoprazole  (PROTONIX ) 40 MG tablet TAKE ONE TABLET BY MOUTH TWICE A DAY (TAKE ON AN EMPTY STOMACH 30 MINUTES PRIOR TO A MEAL)     Polyethyl Glycol-Propyl Glycol (SYSTANE OP) Place 1 drop into both eyes 4 (four) times daily as needed (dry eyes).     Propylene Glycol 0.6 % SOLN INSTILL 1 DROP IN EACH EYE FOUR TIMES A DAY     traMADol  (ULTRAM ) 50 MG tablet 1 tablet (50 mg total) every 6 (six) hours as needed.     tretinoin (RETIN-A) 0.1 % cream APPLY THIN LAYER TO AFFECTED AREA NIGHTLY AS DIRECTED. APPLY A SMALL AMOUNT TO THE BUMPS ON THE ARMS AND CHEST NIGHTLY     triamcinolone  ointment (KENALOG ) 0.1 % Apply 1 application topically 2 (two) times daily. 450 g 3   valACYclovir  (VALTREX ) 500 MG tablet Take 500 mg by mouth daily.     No current facility-administered medications for this visit.    REVIEW OF SYSTEMS (negative unless checked):  Cardiac:  []  Chest pain or chest pressure? []  Shortness of breath upon activity? []  Shortness of breath when lying flat? []  Irregular heart rhythm?  Vascular:  []  Pain in calf, thigh, or hip brought on by walking? []  Pain in feet at night that wakes you up from your sleep? []  Blood clot in your veins? []  Leg swelling?  Pulmonary:  []  Oxygen at home? []  Productive cough? []  Wheezing?  Neurologic:  []  Sudden weakness in arms or legs? []  Sudden numbness in arms or legs? []  Sudden onset of difficult speaking or slurred speech? []  Temporary loss of vision in one eye? []  Problems with dizziness?  Gastrointestinal:  []  Blood in stool? []  Vomited blood?  Genitourinary:  []  Burning when urinating? []  Blood in urine?  Psychiatric:  []  Major depression  Hematologic:  []  Bleeding  problems? []  Problems with blood clotting?  Dermatologic:  []  Rashes or ulcers?  Constitutional:  []  Fever or chills?  Ear/Nose/Throat:  []  Change in hearing? []  Nose bleeds? []  Sore throat?  Musculoskeletal:  []  Back pain? []  Joint pain? []  Muscle pain?   Physical Examination   Vitals:   05/23/24 0927 05/23/24 0929  BP: 109/63 101/60  Pulse: 76 76  Temp: 97.7 F (36.5 C)   TempSrc: Temporal   Weight: 200 lb (90.7 kg)    Body mass index is 27.89 kg/m.  General:  WDWN in NAD; vital signs documented above Gait: Not observed HENT: WNL Cardiac: regular Abdomen: soft, NT, no masses Skin: without rashes Vascular Exam/Pulses: palpable radial pulses bilaterally Extremities: without ischemic changes, without gangrene , without cellulitis; without open wounds;  Musculoskeletal: no muscle wasting or atrophy  Neurologic: A&O X 3;  No focal weakness or paresthesias are detected Psychiatric:  The pt has Normal affect.  Non-Invasive Vascular Imaging   Bilateral Carotid Duplex (05/23/2024):  R ICA stenosis: Patent right carotid stent without stenosis R VA:  patent and antegrade L ICA stenosis:  1-39% L VA:  patent and antegrade   Medical Decision Making   Richard Burgess is a 89 y.o. male who presents for surveillance of carotid artery stenosis  Based on the patient's vascular studies, his right carotid artery stent is patent without restenosis.  He has stable left ICA stenosis of 1 to 39% He denies any strokelike symptoms such as slurred speech, facial droop, sudden visual changes, or sudden weakness/numbness He is neurologically intact on exam.  He has palpable and equal radial pulses bilaterally He can follow up with our office in 1 year with repeat carotid duplex   Ahmed Holster PA-C [facility] Office: [phone]  Clinic MD: Serene

## 2024-06-05 NOTE — Progress Notes (Unsigned)
 " Cardiology Office Note:    Date:  06/06/2024   ID:  Richard Burgess, DOB 03/06/1929, MRN [id number]  PCP:  Garald Karlynn GAILS, MD  Cardiologist:  Annabella Scarce, MD  Electrophysiologist:  None   Referring MD: Garald Karlynn GAILS, MD   Chief Complaint  Patient presents with   Coronary Artery Disease    History of Present Illness:    Richard Burgess is a 89 y.o. male with a hx of CAD, chronic systolic heart failure, hypertension, hyperlipidemia, aortic aneurysm, carotid stenosis status post right carotid stent who is referred by Dr. Garald for evaluation of CAD.  Previously followed with Dr. Scarce, last seen in 2021.  He was admitted with NSTEMI 01/2017.  Underwent LHC which showed three-vessel CAD (100% RCA, 60% ostial LAD, 80% OM1, 40% OM) for which medical management was recommended.  EF 45 to 50% on LV gram.  Denies any chest pain, dyspnea, lightheadedness, syncope, lower extremity edema, or palpitations.  Reports his activity is limited by knee pain, does not go for walks.  Reports he gardens for exercise in the summer.  Past Medical History:  Diagnosis Date   Allergy    BPH (benign prostatic hypertrophy)    Cancer (HCC)    skin cancers   Carotid artery occlusion    Cataract    Diabetes mellitus without complication (HCC)    Diverticulosis of colon    ED (erectile dysfunction)    GERD (gastroesophageal reflux disease)    Glucose intolerance (impaired glucose tolerance)    Hearing loss    no hearing aids   History of pancreatitis    HSV infection    left eye   Hx-TIA (transient ischemic attack)    Hyperlipidemia    Hypertension    Hypertention, malignant, with acute intensive management    Low back pain    Osteoarthritis    PVD (peripheral vascular disease)    Bilateral carotid   Tubular adenoma of colon 08/1991   One with carcinoma IN [facility] 1993, multiple adenomatous   Wears partial dentures    upper    Past Surgical History:  Procedure Laterality  Date   APPENDECTOMY     BASAL CELL CARCINOMA EXCISION     check and ear lobe   BLEPHAROPLASTY Left    x 3   CHOLECYSTECTOMY     COLONOSCOPY     COSMETIC SURGERY     eyes   ESOPHAGOGASTRODUODENOSCOPY     EYE SURGERY     LEFT HEART CATH AND CORONARY ANGIOGRAPHY N/A 01/26/2017   Procedure: LEFT HEART CATH AND CORONARY ANGIOGRAPHY;  Surgeon: Court Dorn PARAS, MD;  Location: [facility];  Service: Cardiovascular;  Laterality: N/A;   LUMBAR SPINE SURGERY     X4   SPINE SURGERY     Lower back   TRANSCAROTID ARTERY REVASCULARIZATION  Right 04/25/2020   Procedure: RIGHT TRANSCAROTID ARTERY REVASCULARIZATION;  Surgeon: Serene Gaile LELON, MD;  Location: [facility] OR;  Service: Vascular;  Laterality: Right;   ULTRASOUND GUIDANCE FOR VASCULAR ACCESS Right 04/25/2020   Procedure: ULTRASOUND GUIDANCE FOR VASCULAR ACCESS;  Surgeon: Serene Gaile LELON, MD;  Location: [facility] OR;  Service: Vascular;  Laterality: Right;    Current Medications: Active Medications[1]   Allergies:   Bee venom, Amlodipine , Livalo  [pitavastatin ], Oxycodone , Pravastatin sodium, Zetia  [ezetimibe ], Rosuvastatin, and Statins   Social History   Socioeconomic History   Marital status: Widowed    Spouse name: Not on file   Number of children: 3  Years of education: Not on file   Highest education level: 6th grade  Occupational History   Occupation: Retired-Construction  Tobacco Use   Smoking status: Former    Current packs/day: 0.00    Average packs/day: 0.5 packs/day for 20.0 years (10.0 ttl pk-yrs)    Types: Cigarettes    Start date: 20    Quit date: 65    Years since quitting: 56.0    Passive exposure: Never   Smokeless tobacco: Never   Tobacco comments:    Daily caffeine use, regular exercise  Vaping Use   Vaping status: Never Used  Substance and Sexual Activity   Alcohol  use: Yes    Alcohol /week: 2.0 standard drinks of alcohol     Types: 2 Cans of beer per week   Drug use: Never   Sexual activity: Not  Currently  Other Topics Concern   Not on file  Social History Narrative   Not on file   Social Drivers of Health   Tobacco Use: Medium Risk (06/06/2024)   Patient History    Smoking Tobacco Use: Former    Smokeless Tobacco Use: Never    Passive Exposure: Never  Physicist, Medical Strain: Low Risk (05/01/2024)   Overall Financial Resource Strain (CARDIA)    Difficulty of Paying Living Expenses: Not very hard  Food Insecurity: No Food Insecurity (05/01/2024)   Epic    Worried About Programme Researcher, Broadcasting/film/video in the Last Year: Never true    Ran Out of Food in the Last Year: Never true  Transportation Needs: No Transportation Needs (05/01/2024)   Epic    Lack of Transportation (Medical): No    Lack of Transportation (Non-Medical): No  Physical Activity: Inactive (05/01/2024)   Exercise Vital Sign    Days of Exercise per Week: 0 days    Minutes of Exercise per Session: Not on file  Stress: No Stress Concern Present (05/01/2024)   [employer] of Occupational Health - Occupational Stress Questionnaire    Feeling of Stress: Only a little  Social Connections: Moderately Integrated (05/01/2024)   Social Connection and Isolation Panel    Frequency of Communication with Friends and Family: More than three times a week    Frequency of Social Gatherings with Friends and Family: More than three times a week    Attends Religious Services: More than 4 times per year    Active Member of [employer] or Organizations: Yes    Attends Banker Meetings: More than 4 times per year    Marital Status: Widowed  Recent Concern: Social Connections - Moderately Isolated (02/18/2024)   Social Connection and Isolation Panel    Frequency of Communication with Friends and Family: More than three times a week    Frequency of Social Gatherings with Friends and Family: More than three times a week    Attends Religious Services: Never    Database Administrator or Organizations: Yes    Attends Museum/gallery Exhibitions Officer: More than 4 times per year    Marital Status: Widowed  Depression (PHQ2-9): Low Risk (02/18/2024)   Depression (PHQ2-9)    PHQ-2 Score: 0  Alcohol  Screen: Low Risk (05/01/2024)   Alcohol  Screen    Last Alcohol  Screening Score (AUDIT): 2  Housing: Low Risk (05/01/2024)   Epic    Unable to Pay for Housing in the Last Year: No    Number of Times Moved in the Last Year: 0    Homeless in the Last Year: No  Utilities:  Not At Risk (02/18/2024)   Epic    Threatened with loss of utilities: No  Health Literacy: Inadequate Health Literacy (02/18/2024)   B1300 Health Literacy    Frequency of need for help with medical instructions: Always     Family History: The patient's family history includes Colon cancer in his sister; Dementia in his father; Diabetes in his mother; Peripheral vascular disease in his brother.  ROS:   Please see the history of present illness.     All other systems reviewed and are negative.  EKGs/Labs/Other Studies Reviewed:    The following studies were reviewed today:   EKG:   06/06/2024: Right bundle branch block, left posterior fascicular block, rate 80, T wave inversions in inferior leads  Recent Labs: 05/03/2024: ALT 9; BUN 15; Creatinine, Ser 0.87; Hemoglobin 13.9; Platelets 228.0; Potassium 4.4; Sodium 139; TSH 2.23  Recent Lipid Panel    Component Value Date/Time   CHOL 104 07/07/2023 0907   TRIG 104 07/07/2023 0907   HDL 40 07/07/2023 0907   CHOLHDL 2.6 07/07/2023 0907   CHOLHDL 2 04/12/2021 0755   VLDL 21.8 04/12/2021 0755   LDLCALC 44 07/07/2023 0907   LDLDIRECT 48 05/08/2022 0849   LDLDIRECT 153.0 02/18/2016 0826    Physical Exam:    VS:  BP (!) 81/48   Pulse 79   Ht 5' 11 (1.803 m)   Wt 195 lb 9.6 oz (88.7 kg)   SpO2 96%   BMI 27.28 kg/m     Wt Readings from Last 3 Encounters:  06/06/24 195 lb 9.6 oz (88.7 kg)  05/23/24 200 lb (90.7 kg)  02/18/24 203 lb (92.1 kg)     GEN:  in no acute distress HEENT:  Normal NECK: No JVD; No carotid bruits LYMPHATICS: No lymphadenopathy CARDIAC: RRR, 2/6 systplic  murmur RESPIRATORY:  Clear to auscultation without rales, wheezing or rhonchi  ABDOMEN: Soft, non-tender, non-distended MUSCULOSKELETAL:  No edema; No deformity  SKIN: Warm and dry NEUROLOGIC:  Alert and oriented x 3 PSYCHIATRIC:  Normal affect   ASSESSMENT:    1. Coronary artery disease involving native coronary artery of native heart without angina pectoris   2. Chronic systolic heart failure (HCC)   3. Therapeutic drug monitoring   4. Hypotension, unspecified hypotension type   5. Carotid stenosis, bilateral   6. Hyperlipidemia, unspecified hyperlipidemia type    PLAN:    Hypotension: BP 81/48 in clinic today.  Was 86/60 on my recheck.  Appears asymptomatic.  Recommend discontinuing losartan .  Decrease Imdur  to 30 mg daily.  Asked to check BP twice daily for next week and let us  know results.  Check CMET, CBC  CAD: He was admitted with NSTEMI 01/2017.  Underwent LHC which showed three-vessel CAD (100% RCA, 60% ostial LAD, 80% OM1, 40% OM) for which medical management was recommended.  LV gram showed EF 45 to 50%. - Continue aspirin  81 mg daily - Continue Repatha .  Did not tolerate statins or Zetia  - Continue Imdur , decrease dose to 30 mg daily as above  Chronic systolic heart failure: LV gram showed EF 45 to 50% 01/2017.  Does not appear has had echocardiogram - Recommend echocardiogram - Previously on metoprolol  but discontinued due to bradycardia - Discontinue losartan  due to low BP as above  Carotid stenosis: Status post right carotid stenting 04/2020.  Follows with vascular surgery.  Continue aspirin , Repatha   Hyperlipidemia: On Repatha .  LDL 44 07/07/23.  Update lipid panel  RTC in 3 months   Medication Adjustments/Labs  and Tests Ordered: Current medicines are reviewed at length with the patient today.  Concerns regarding medicines are outlined above.  Orders Placed This  Encounter  Procedures   CBC with Differential/Platelet   Lipid panel   Comprehensive metabolic panel with GFR   EKG 87-Ozji   ECHOCARDIOGRAM COMPLETE   Meds ordered this encounter  Medications   isosorbide  mononitrate (IMDUR ) 30 MG 24 hr tablet    Sig: Take 1 tablet (30 mg total) by mouth daily.    Dispense:  90 tablet    Refill:  3    NEW DOSE, D/C 60 MG RX    Patient Instructions  Medication Instructions:  STOP LOSARTAN    DECREASE ISOSORBIDE  TO 30 MG DAILY   *If you need a refill on your cardiac medications before your next appointment, please call your pharmacy*  Lab Work: CMET/CB/LIPID TODAY   If you have labs (blood work) drawn today and your tests are completely normal, you will receive your results only by: MyChart Message (if you have MyChart) OR A paper copy in the mail If you have any lab test that is abnormal or we need to change your treatment, we will call you to review the results.  Testing/Procedures: Your physician has requested that you have an echocardiogram. Echocardiography is a painless test that uses sound waves to create images of your heart. It provides your doctor with information about the size and shape of your heart and how well your hearts chambers and valves are working. This procedure takes approximately one hour. There are no restrictions for this procedure. Please do NOT wear cologne, perfume, aftershave, or lotions (deodorant is allowed). Please arrive 15 minutes prior to your appointment time.  Please note: We ask at that you not bring children with you during ultrasound (echo/ vascular) testing. Due to room size and safety concerns, children are not allowed in the ultrasound rooms during exams. Our front office staff cannot provide observation of children in our lobby area while testing is being conducted. An adult accompanying a patient to their appointment will only be allowed in the ultrasound room at the discretion of the ultrasound  technician under special circumstances. We apologize for any inconvenience.  Follow-Up: At [facility], you and your health needs are our priority.  As part of our continuing mission to provide you with exceptional heart care, our providers are all part of one team.  This team includes your primary Cardiologist (physician) and Advanced Practice Providers or APPs (Physician Assistants and Nurse Practitioners) who all work together to provide you with the care you need, when you need it.  Your next appointment:   3 month(s)  Provider:   DR KATE OR APP   We recommend signing up for the patient portal called MyChart.  Sign up information is provided on this After Visit Summary.  MyChart is used to connect with patients for Virtual Visits (Telemedicine).  Patients are able to view lab/test results, encounter notes, upcoming appointments, etc.  Non-urgent messages can be sent to your provider as well.   To learn more about what you can do with MyChart, go to [email or web address].   Other Instructions MONITOR YOUR BLOOD PRESSURE TWICE A DAY FOR ABOUT A WEEK AND SEND/CALL IN READINGS            Signed, Lonni LITTIE Kate, MD  06/06/2024 12:21 PM    [facility]     [1]  Current Meds  Medication Sig   albuterol  (VENTOLIN   HFA) 108 (90 Base) MCG/ACT inhaler INHALE 2 PUFFS BY MOUTH FOUR TIMES A DAY AS NEEDED FOR SHORTNESS OF BREATH   aspirin  EC 81 MG tablet Take 81 mg by mouth daily.   cetirizine  (ZYRTEC ) 10 MG tablet Take 1 tablet (10 mg total) by mouth daily.   Cholecalciferol  25 MCG (1000 UT) tablet Take by mouth.   Cinnamon 500 MG capsule Take 2,000 mg by mouth daily.   clotrimazole  (LOTRIMIN ) 1 % cream APPLY SMALL AMOUNT TO AFFECTED AREA TWICE A DAY   clotrimazole -betamethasone  (LOTRISONE ) cream Apply 1 Application topically daily.   cyanocobalamin  500 MCG tablet Take 500 mcg by mouth daily. Vitamin B12   docusate calcium  (SURFAK) 240 MG  capsule Take 240 mg by mouth at bedtime.   Docusate Sodium  (DSS) 100 MG CAPS Take 100 mg by mouth.   Evolocumab  (REPATHA  SURECLICK) 140 MG/ML SOAJ INJECT 140 MG INTO THE SKIN EVERY 14 (FOURTEEN) DAYS. PT. IS OVERDUE FOR FOLLOWUP.   ipratropium-albuterol  (DUONEB) 0.5-2.5 (3) MG/3ML SOLN Take 3 mLs by nebulization every 6 (six) hours as needed.   Lifitegrast 5 % SOLN INSTILL 1 DROP IN EACH EYE TWICE A DAY   melatonin 3 MG TABS tablet Take 3 mg by mouth at bedtime.   methylPREDNISolone  (MEDROL  DOSEPAK) 4 MG TBPK tablet As directed   Multiple Vitamins-Minerals (PRESERVISION AREDS 2) CAPS Take 1 capsule by mouth in the morning and at bedtime.   nitroGLYCERIN  (NITROSTAT ) 0.4 MG SL tablet Place 1 tablet (0.4 mg total) under the tongue every 5 (five) minutes as needed for chest pain.   pantoprazole  (PROTONIX ) 40 MG tablet TAKE ONE TABLET BY MOUTH TWICE A DAY (TAKE ON AN EMPTY STOMACH 30 MINUTES PRIOR TO A MEAL)   Polyethyl Glycol-Propyl Glycol (SYSTANE OP) Place 1 drop into both eyes 4 (four) times daily as needed (dry eyes).   Propylene Glycol 0.6 % SOLN INSTILL 1 DROP IN EACH EYE FOUR TIMES A DAY   traMADol  (ULTRAM ) 50 MG tablet 1 tablet (50 mg total) every 6 (six) hours as needed.   tretinoin (RETIN-A) 0.1 % cream APPLY THIN LAYER TO AFFECTED AREA NIGHTLY AS DIRECTED. APPLY A SMALL AMOUNT TO THE BUMPS ON THE ARMS AND CHEST NIGHTLY   triamcinolone  ointment (KENALOG ) 0.1 % Apply 1 application topically 2 (two) times daily.   valACYclovir  (VALTREX ) 500 MG tablet Take 500 mg by mouth daily.   [DISCONTINUED] isosorbide  mononitrate (IMDUR ) 60 MG 24 hr tablet Take 1 tablet (60 mg total) by mouth daily.   [DISCONTINUED] losartan  (COZAAR ) 25 MG tablet TAKE ONE TABLET BY MOUTH EVERY MORNING FOR HEART   "

## 2024-06-06 ENCOUNTER — Encounter (HOSPITAL_BASED_OUTPATIENT_CLINIC_OR_DEPARTMENT_OTHER): Payer: Self-pay | Admitting: Cardiology

## 2024-06-06 ENCOUNTER — Ambulatory Visit (INDEPENDENT_AMBULATORY_CARE_PROVIDER_SITE_OTHER): Admitting: Cardiology

## 2024-06-06 VITALS — BP 81/48 | HR 79 | Ht 71.0 in | Wt 195.6 lb

## 2024-06-06 DIAGNOSIS — I959 Hypotension, unspecified: Secondary | ICD-10-CM

## 2024-06-06 DIAGNOSIS — Z5181 Encounter for therapeutic drug level monitoring: Secondary | ICD-10-CM

## 2024-06-06 DIAGNOSIS — I5022 Chronic systolic (congestive) heart failure: Secondary | ICD-10-CM

## 2024-06-06 DIAGNOSIS — E785 Hyperlipidemia, unspecified: Secondary | ICD-10-CM

## 2024-06-06 DIAGNOSIS — I2510 Atherosclerotic heart disease of native coronary artery without angina pectoris: Secondary | ICD-10-CM | POA: Diagnosis not present

## 2024-06-06 DIAGNOSIS — I6523 Occlusion and stenosis of bilateral carotid arteries: Secondary | ICD-10-CM

## 2024-06-06 MED ORDER — ISOSORBIDE MONONITRATE ER 30 MG PO TB24: 30.0000 mg | ORAL_TABLET | Freq: Every day | ORAL | 3 refills | Status: AC

## 2024-06-06 NOTE — Patient Instructions (Signed)
 Medication Instructions:  STOP LOSARTAN    DECREASE ISOSORBIDE  TO 30 MG DAILY   *If you need a refill on your cardiac medications before your next appointment, please call your pharmacy*  Lab Work: CMET/CB/LIPID TODAY   If you have labs (blood work) drawn today and your tests are completely normal, you will receive your results only by: MyChart Message (if you have MyChart) OR A paper copy in the mail If you have any lab test that is abnormal or we need to change your treatment, we will call you to review the results.  Testing/Procedures: Your physician has requested that you have an echocardiogram. Echocardiography is a painless test that uses sound waves to create images of your heart. It provides your doctor with information about the size and shape of your heart and how well your hearts chambers and valves are working. This procedure takes approximately one hour. There are no restrictions for this procedure. Please do NOT wear cologne, perfume, aftershave, or lotions (deodorant is allowed). Please arrive 15 minutes prior to your appointment time.  Please note: We ask at that you not bring children with you during ultrasound (echo/ vascular) testing. Due to room size and safety concerns, children are not allowed in the ultrasound rooms during exams. Our front office staff cannot provide observation of children in our lobby area while testing is being conducted. An adult accompanying a patient to their appointment will only be allowed in the ultrasound room at the discretion of the ultrasound technician under special circumstances. We apologize for any inconvenience.  Follow-Up: At [facility], you and your health needs are our priority.  As part of our continuing mission to provide you with exceptional heart care, our providers are all part of one team.  This team includes your primary Cardiologist (physician) and Advanced Practice Providers or APPs (Physician Assistants and Nurse  Practitioners) who all work together to provide you with the care you need, when you need it.  Your next appointment:   3 month(s)  Provider:   DR KATE OR APP   We recommend signing up for the patient portal called MyChart.  Sign up information is provided on this After Visit Summary.  MyChart is used to connect with patients for Virtual Visits (Telemedicine).  Patients are able to view lab/test results, encounter notes, upcoming appointments, etc.  Non-urgent messages can be sent to your provider as well.   To learn more about what you can do with MyChart, go to [email or web address].   Other Instructions MONITOR YOUR BLOOD PRESSURE TWICE A DAY FOR ABOUT A WEEK AND SEND/CALL IN READINGS

## 2024-06-07 ENCOUNTER — Ambulatory Visit: Payer: Self-pay | Admitting: Cardiology

## 2024-06-07 ENCOUNTER — Encounter (HOSPITAL_BASED_OUTPATIENT_CLINIC_OR_DEPARTMENT_OTHER): Payer: Self-pay | Admitting: Cardiology

## 2024-06-07 ENCOUNTER — Encounter: Payer: Self-pay | Admitting: Internal Medicine

## 2024-06-07 LAB — CBC WITH DIFFERENTIAL/PLATELET
Basophils Absolute: 0.1 x10E3/uL (ref 0.0–0.2)
Basos: 1 %
EOS (ABSOLUTE): 0.3 x10E3/uL (ref 0.0–0.4)
Eos: 4 %
Hematocrit: 40.9 % (ref 37.5–51.0)
Hemoglobin: 13.7 g/dL (ref 13.0–17.7)
Immature Grans (Abs): 0 x10E3/uL (ref 0.0–0.1)
Immature Granulocytes: 0 %
Lymphocytes Absolute: 1.9 x10E3/uL (ref 0.7–3.1)
Lymphs: 22 %
MCH: 31.8 pg (ref 26.6–33.0)
MCHC: 33.5 g/dL (ref 31.5–35.7)
MCV: 95 fL (ref 79–97)
Monocytes Absolute: 0.8 x10E3/uL (ref 0.1–0.9)
Monocytes: 10 %
Neutrophils Absolute: 5.4 x10E3/uL (ref 1.4–7.0)
Neutrophils: 63 %
Platelets: 206 x10E3/uL (ref 150–450)
RBC: 4.31 x10E6/uL (ref 4.14–5.80)
RDW: 13.5 % (ref 11.6–15.4)
WBC: 8.4 x10E3/uL (ref 3.4–10.8)

## 2024-06-07 LAB — LIPID PANEL
Chol/HDL Ratio: 2.7 ratio (ref 0.0–5.0)
Cholesterol, Total: 109 mg/dL (ref 100–199)
HDL: 41 mg/dL
LDL Chol Calc (NIH): 43 mg/dL (ref 0–99)
Triglycerides: 145 mg/dL (ref 0–149)
VLDL Cholesterol Cal: 25 mg/dL (ref 5–40)

## 2024-06-07 LAB — COMPREHENSIVE METABOLIC PANEL WITH GFR
ALT: 13 IU/L (ref 0–44)
AST: 20 IU/L (ref 0–40)
Albumin: 4.2 g/dL (ref 3.6–4.6)
Alkaline Phosphatase: 90 IU/L (ref 48–129)
BUN/Creatinine Ratio: 15 (ref 10–24)
BUN: 18 mg/dL (ref 10–36)
Bilirubin Total: 0.7 mg/dL (ref 0.0–1.2)
CO2: 20 mmol/L (ref 20–29)
Calcium: 9.8 mg/dL (ref 8.6–10.2)
Chloride: 105 mmol/L (ref 96–106)
Creatinine, Ser: 1.21 mg/dL (ref 0.76–1.27)
Globulin, Total: 2.5 g/dL (ref 1.5–4.5)
Glucose: 171 mg/dL — ABNORMAL HIGH (ref 70–99)
Potassium: 5.1 mmol/L (ref 3.5–5.2)
Sodium: 142 mmol/L (ref 134–144)
Total Protein: 6.7 g/dL (ref 6.0–8.5)
eGFR: 56 mL/min/1.73 — ABNORMAL LOW

## 2024-06-07 NOTE — Telephone Encounter (Signed)
 Daughter very hesitant to stop Losartan  and decrease Imdur . She was very surprised that pt's BP was that low (81/48) at [facility] yesterday. Pt saw a provider at the [facility] about 2 months ago and SBP was in the 160's.   BP this am was 112/68 BP this evening was 168/88.   Pt not c/o any symptoms.

## 2024-06-22 ENCOUNTER — Encounter (HOSPITAL_BASED_OUTPATIENT_CLINIC_OR_DEPARTMENT_OTHER): Payer: Self-pay | Admitting: Cardiology

## 2024-06-29 ENCOUNTER — Other Ambulatory Visit (HOSPITAL_BASED_OUTPATIENT_CLINIC_OR_DEPARTMENT_OTHER): Payer: Self-pay | Admitting: Cardiovascular Disease

## 2024-06-30 ENCOUNTER — Other Ambulatory Visit: Payer: Self-pay

## 2024-06-30 MED ORDER — REPATHA SURECLICK 140 MG/ML ~~LOC~~ SOAJ: SUBCUTANEOUS | 1 refills | Status: AC

## 2024-06-30 NOTE — Telephone Encounter (Signed)
 Pt's medication was sent to pt's pharmacy as requested. Confirmation received.

## 2024-07-06 ENCOUNTER — Ambulatory Visit (HOSPITAL_BASED_OUTPATIENT_CLINIC_OR_DEPARTMENT_OTHER)

## 2024-07-06 DIAGNOSIS — I5022 Chronic systolic (congestive) heart failure: Secondary | ICD-10-CM | POA: Diagnosis not present

## 2024-07-07 LAB — ECHOCARDIOGRAM COMPLETE
AR max vel: 1.15 cm2
AV Area VTI: 1.14 cm2
AV Area mean vel: 1.11 cm2
AV Mean grad: 10 mmHg
AV Peak grad: 17.6 mmHg
Ao pk vel: 2.1 m/s
Area-P 1/2: 3.27 cm2
S' Lateral: 2.45 cm

## 2024-08-15 ENCOUNTER — Ambulatory Visit (HOSPITAL_BASED_OUTPATIENT_CLINIC_OR_DEPARTMENT_OTHER): Admitting: Cardiology

## 2025-02-22 ENCOUNTER — Ambulatory Visit
